# Patient Record
Sex: Male | Born: 1950
Health system: Southern US, Community
[De-identification: ages and names within clinical notes are randomized; demographics above are authoritative.]

## PROBLEM LIST (undated history)

## (undated) DIAGNOSIS — C159 Malignant neoplasm of esophagus, unspecified: Principal | ICD-10-CM

## (undated) DIAGNOSIS — Z5111 Encounter for antineoplastic chemotherapy: Secondary | ICD-10-CM

## (undated) DIAGNOSIS — I4891 Unspecified atrial fibrillation: Secondary | ICD-10-CM

## (undated) DIAGNOSIS — M109 Gout, unspecified: Secondary | ICD-10-CM

## (undated) DIAGNOSIS — Z862 Personal history of diseases of the blood and blood-forming organs and certain disorders involving the immune mechanism: Secondary | ICD-10-CM

## (undated) DIAGNOSIS — Z95828 Presence of other vascular implants and grafts: Secondary | ICD-10-CM

## (undated) DIAGNOSIS — E0590 Thyrotoxicosis, unspecified without thyrotoxic crisis or storm: Secondary | ICD-10-CM

## (undated) DIAGNOSIS — Z8619 Personal history of other infectious and parasitic diseases: Secondary | ICD-10-CM

## (undated) DIAGNOSIS — K219 Gastro-esophageal reflux disease without esophagitis: Secondary | ICD-10-CM

## (undated) DIAGNOSIS — C801 Malignant (primary) neoplasm, unspecified: Secondary | ICD-10-CM

## (undated) HISTORY — PX: LEG SURGERY: SHX1003

## (undated) HISTORY — DX: Personal history of other infectious and parasitic diseases: Z86.19

## (undated) HISTORY — DX: Gout, unspecified: M10.9

## (undated) HISTORY — DX: Hypercalcemia: E83.52

## (undated) HISTORY — DX: Presence of other vascular implants and grafts: Z95.828

## (undated) HISTORY — DX: Unspecified atrial fibrillation: I48.91

## (undated) HISTORY — DX: Encounter for antineoplastic chemotherapy: Z51.11

## (undated) HISTORY — DX: Malignant neoplasm of esophagus, unspecified: C15.9

## (undated) HISTORY — PX: OTHER SURGICAL HISTORY: SHX169

## (undated) HISTORY — DX: Thyrotoxicosis, unspecified without thyrotoxic crisis or storm: E05.90

## (undated) HISTORY — DX: Personal history of diseases of the blood and blood-forming organs and certain disorders involving the immune mechanism: Z86.2

## (undated) HISTORY — DX: Gastro-esophageal reflux disease without esophagitis: K21.9

## (undated) HISTORY — PX: TONSILLECTOMY AND ADENOIDECTOMY: SUR1326

## (undated) HISTORY — PX: TRANSTHORACIC ECHOCARDIOGRAM: SHX275

---

## 2013-04-23 ENCOUNTER — Encounter (HOSPITAL_COMMUNITY): Payer: Self-pay | Admitting: *Deleted

## 2013-04-23 ENCOUNTER — Emergency Department (HOSPITAL_COMMUNITY): Payer: BC Managed Care – PPO

## 2013-04-23 ENCOUNTER — Inpatient Hospital Stay (HOSPITAL_COMMUNITY)
Admission: EM | Admit: 2013-04-23 | Discharge: 2013-04-26 | DRG: 544 | Disposition: A | Discharge status: Home / Self Care | Payer: BC Managed Care – PPO | Attending: Internal Medicine | Admitting: Internal Medicine

## 2013-04-23 DIAGNOSIS — Z880 Allergy status to penicillin: Secondary | ICD-10-CM

## 2013-04-23 DIAGNOSIS — I4891 Unspecified atrial fibrillation: Principal | ICD-10-CM

## 2013-04-23 DIAGNOSIS — E0590 Thyrotoxicosis, unspecified without thyrotoxic crisis or storm: Secondary | ICD-10-CM

## 2013-04-23 DIAGNOSIS — J189 Pneumonia, unspecified organism: Secondary | ICD-10-CM

## 2013-04-23 DIAGNOSIS — Z79899 Other long term (current) drug therapy: Secondary | ICD-10-CM

## 2013-04-23 DIAGNOSIS — E876 Hypokalemia: Secondary | ICD-10-CM | POA: Diagnosis present

## 2013-04-23 DIAGNOSIS — F10939 Alcohol use, unspecified with withdrawal, unspecified: Secondary | ICD-10-CM

## 2013-04-23 DIAGNOSIS — F10239 Alcohol dependence with withdrawal, unspecified: Secondary | ICD-10-CM

## 2013-04-23 DIAGNOSIS — F1020 Alcohol dependence, uncomplicated: Secondary | ICD-10-CM | POA: Diagnosis present

## 2013-04-23 DIAGNOSIS — R319 Hematuria, unspecified: Secondary | ICD-10-CM

## 2013-04-23 DIAGNOSIS — Z87891 Personal history of nicotine dependence: Secondary | ICD-10-CM

## 2013-04-23 LAB — PROTIME-INR
INR: 1.25 (ref 0.00–1.49)
Prothrombin Time: 15.5 seconds — ABNORMAL HIGH (ref 11.6–15.2)

## 2013-04-23 LAB — CBC
Hemoglobin: 14.9 g/dL (ref 13.0–17.0)
MCHC: 35.7 g/dL (ref 30.0–36.0)
WBC: 18.8 10*3/uL — ABNORMAL HIGH (ref 4.0–10.5)

## 2013-04-23 LAB — BASIC METABOLIC PANEL
GFR calc Af Amer: >90 mL/min (ref >90)
GFR calc non Af Amer: >90 mL/min (ref >90)
Glucose, Bld: 102 mg/dL — ABNORMAL HIGH (ref 70–99)
Potassium: 4 mEq/L (ref 3.5–5.1)
Sodium: 129 mEq/L — ABNORMAL LOW (ref 135–145)

## 2013-04-23 LAB — URINALYSIS, ROUTINE W REFLEX MICROSCOPIC
Glucose, UA: NEGATIVE mg/dL
Hgb urine dipstick: NEGATIVE
Ketones, ur: 15 mg/dL — AB
Protein, ur: NEGATIVE mg/dL

## 2013-04-23 MED ORDER — IOHEXOL 350 MG/ML SOLN
100.0000 mL | Freq: Once | INTRAVENOUS | Status: AC | PRN
  Administered 2013-04-23: 100 mL via INTRAVENOUS

## 2013-04-23 MED ORDER — DILTIAZEM HCL 100 MG IV SOLR
5.0000 mg/h | INTRAVENOUS | Status: DC
  Administered 2013-04-23: 10 mg/h via INTRAVENOUS
  Administered 2013-04-24: 15 mg/h via INTRAVENOUS
  Administered 2013-04-24 (×2): 10 mg/h via INTRAVENOUS
  Administered 2013-04-25: 8 mg/h via INTRAVENOUS
  Filled 2013-04-23 (×3): qty 100

## 2013-04-23 MED ORDER — DEXTROSE 5 % IV SOLN
500.0000 mg | Freq: Once | INTRAVENOUS | Status: AC
  Administered 2013-04-23: 500 mg via INTRAVENOUS
  Filled 2013-04-23: qty 500

## 2013-04-23 MED ORDER — LORAZEPAM 1 MG PO TABS: 1.0000 mg | ORAL_TABLET | Freq: Four times a day (QID) | ORAL | Status: DC | PRN

## 2013-04-23 MED ORDER — THIAMINE HCL 100 MG/ML IJ SOLN
100.0000 mg | Freq: Every day | INTRAMUSCULAR | Status: DC
  Filled 2013-04-23: qty 1

## 2013-04-23 MED ORDER — LORAZEPAM 2 MG/ML IJ SOLN: 1.0000 mg | Freq: Four times a day (QID) | INTRAMUSCULAR | Status: DC | PRN

## 2013-04-23 MED ORDER — DILTIAZEM HCL 25 MG/5ML IV SOLN
10.0000 mg | Freq: Once | INTRAVENOUS | Status: AC
  Administered 2013-04-23: 10 mg via INTRAVENOUS
  Filled 2013-04-23: qty 5

## 2013-04-23 MED ORDER — LORAZEPAM 2 MG/ML IJ SOLN
1.0000 mg | Freq: Once | INTRAMUSCULAR | Status: AC
Start: 2013-04-23 — End: 2013-04-23
  Administered 2013-04-23: 1 mg via INTRAVENOUS
  Filled 2013-04-23: qty 1

## 2013-04-23 MED ORDER — DILTIAZEM HCL 25 MG/5ML IV SOLN
10.0000 mg | Freq: Once | INTRAVENOUS | Status: AC
  Administered 2013-04-23: 10 mg via INTRAVENOUS

## 2013-04-23 MED ORDER — FOLIC ACID 1 MG PO TABS
1.0000 mg | ORAL_TABLET | Freq: Every day | ORAL | Status: DC
  Administered 2013-04-25 – 2013-04-26 (×2): 1 mg via ORAL
  Filled 2013-04-23 (×3): qty 1

## 2013-04-23 MED ORDER — ADULT MULTIVITAMIN W/MINERALS CH
1.0000 | ORAL_TABLET | Freq: Every day | ORAL | Status: DC
  Filled 2013-04-23 (×2): qty 1

## 2013-04-23 MED ORDER — LORAZEPAM 2 MG/ML IJ SOLN
1.0000 mg | Freq: Once | INTRAMUSCULAR | Status: AC
  Administered 2013-04-23: 1 mg via INTRAVENOUS
  Filled 2013-04-23: qty 1

## 2013-04-23 MED ORDER — SODIUM CHLORIDE 0.9 % IV BOLUS (SEPSIS)
1000.0000 mL | Freq: Once | INTRAVENOUS | Status: AC
  Administered 2013-04-23: 1000 mL via INTRAVENOUS

## 2013-04-23 MED ORDER — DEXTROSE 5 % IV SOLN
1.0000 g | Freq: Once | INTRAVENOUS | Status: AC
  Administered 2013-04-23: 1 g via INTRAVENOUS
  Filled 2013-04-23: qty 10

## 2013-04-23 MED ORDER — VITAMIN B-1 100 MG PO TABS
100.0000 mg | ORAL_TABLET | Freq: Every day | ORAL | Status: DC
  Administered 2013-04-25 – 2013-04-26 (×2): 100 mg via ORAL
  Filled 2013-04-23 (×3): qty 1

## 2013-04-23 NOTE — ED Provider Notes (Signed)
History     CSN: [id number]  Arrival date & time 04/23/13  1721   First MD Initiated Contact with Patient 04/23/13 1738      Chief Complaint  Patient presents with  . Shortness of Breath  . Hematuria  . Atrial Fibrillation    (Consider location/radiation/quality/duration/timing/severity/associated sxs/prior treatment) Patient is a 62 y.o. male presenting with shortness of breath, hematuria, and atrial fibrillation. The history is provided by the patient.  Shortness of Breath Severity:  Moderate Associated symptoms: no abdominal pain, no chest pain, no diaphoresis, no headaches, no rash and no vomiting   Hematuria Associated symptoms include shortness of breath. Pertinent negatives include no chest pain, no abdominal pain and no headaches.  Atrial Fibrillation Associated symptoms include shortness of breath. Pertinent negatives include no chest pain, no abdominal pain and no headaches.  patient went to his primary care Dr. For hematuria today. He was found to be new onset atrial fibrillation. He has had some shortness of breath for the last few days. He states he does not feel his heart racing. No previous history of atrial fibrillation. No fevers. He has had a little bit of a cough. No abdominal pain. No numbness or weakness. No flank pain. No dysuria. He is a former smoker but does not currently smoke.  Past Medical History  Diagnosis Date  . Medical history non-contributory     Past Surgical History  Procedure Laterality Date  . Leg surgery      Family History  Problem Relation Age of Onset  . Pulmonary embolism Neg Hx   . CAD Neg Hx     History  Substance Use Topics  . Smoking status: Former Games developer  . Smokeless tobacco: Not on file  . Alcohol Use: Yes     Comment: daily      Review of Systems  Constitutional: Negative for chills, diaphoresis, activity change and appetite change.  HENT: Negative for neck stiffness.   Eyes: Negative for pain.  Respiratory:  Positive for shortness of breath. Negative for chest tightness.   Cardiovascular: Negative for chest pain, palpitations and leg swelling.  Gastrointestinal: Negative for nausea, vomiting, abdominal pain and diarrhea.  Genitourinary: Positive for hematuria. Negative for flank pain.  Musculoskeletal: Negative for back pain.  Skin: Negative for rash.  Neurological: Negative for weakness, numbness and headaches.  Psychiatric/Behavioral: Negative for behavioral problems.    Allergies  Penicillins  Home Medications   Current Outpatient Rx  Name  Route  Sig  Dispense  Refill  . aspirin EC 81 MG tablet   Oral   Take 81 mg by mouth 3 (three) times a week.         . B Complex-C (SUPER B COMPLEX PO)   Oral   Take 1 tablet by mouth daily.         . diphenhydrAMINE (BENADRYL) 25 MG tablet   Oral   Take 25 mg by mouth every evening.         . Ibuprofen-Diphenhydramine Cit (ADVIL PM PO)   Oral   Take 2 tablets by mouth every evening.         . Multiple Vitamin (MULTIVITAMIN WITH MINERALS) TABS   Oral   Take 1 tablet by mouth daily.         . pseudoephedrine (SUDAFED) 30 MG tablet   Oral   Take 60 mg by mouth daily.           BP 133/62  Pulse 119  Temp(Src) 98.8 F (  37.1 C) (Oral)  Resp 38  SpO2 96%  Physical Exam  Nursing note reviewed. Constitutional: He is oriented to person, place, and time. He appears well-developed and well-nourished.  HENT:  Head: Normocephalic and atraumatic.  Eyes: EOM are normal. Pupils are equal, round, and reactive to light.  Neck: Normal range of motion. Neck supple.  Cardiovascular: Normal heart sounds.   No murmur heard. Tachycardia. Irregular.   Pulmonary/Chest: Effort normal.  Tachypnea  Abdominal: Soft. Bowel sounds are normal. He exhibits no distension and no mass. There is no tenderness. There is no rebound and no guarding.  Musculoskeletal: Normal range of motion. He exhibits no edema.  Neurological: He is alert and  oriented to person, place, and time. No cranial nerve deficit.  Skin: Skin is warm and dry.  Psychiatric: He has a normal mood and affect.    ED Course  Procedures (including critical care time)  Labs Reviewed  CBC - Abnormal; Notable for the following:    WBC 18.8 (*)    All other components within normal limits  BASIC METABOLIC PANEL - Abnormal; Notable for the following:    Sodium 129 (*)    Chloride 95 (*)    Glucose, Bld 102 (*)    All other components within normal limits  PRO B NATRIURETIC PEPTIDE - Abnormal; Notable for the following:    Pro B Natriuretic peptide (BNP) 687.4 (*)    All other components within normal limits  PROTIME-INR - Abnormal; Notable for the following:    Prothrombin Time 15.5 (*)    All other components within normal limits  D-DIMER, QUANTITATIVE - Abnormal; Notable for the following:    D-Dimer, Quant 0.51 (*)    All other components within normal limits  URINALYSIS, ROUTINE W REFLEX MICROSCOPIC - Abnormal; Notable for the following:    Color, Urine AMBER (*)    Ketones, ur 15 (*)    All other components within normal limits  CULTURE, BLOOD (ROUTINE X 2)  CULTURE, BLOOD (ROUTINE X 2)  URINE CULTURE  APTT  LEGIONELLA ANTIGEN, URINE  STREP PNEUMONIAE URINARY ANTIGEN  PROCALCITONIN  POCT I-STAT TROPONIN I   Ct Angio Chest W/cm &/or Wo Cm  04/23/2013  *RADIOLOGY REPORT*  Clinical Data: Shortness of breath and hematuria.  Tachycardia with atrial fibrillation.  No given history of malignancy.  CT ANGIOGRAPHY CHEST  Technique:  Multidetector CT imaging of the chest using the standard protocol during bolus administration of intravenous contrast. Multiplanar reconstructed images including MIPs were obtained and reviewed to evaluate the vascular anatomy.  Contrast: OMNIPAQUE IOHEXOL 350 MG/ML SOLN  Comparison: Chest radiographs 04/23/2013.  Findings: The pulmonary arteries are well opacified with contrast. There is moderate breathing artifact which  limits evaluation of the lower lobe vessels.  However, no pulmonary emboli are identified. There are small subcarinal and hilar lymph nodes which do not appear pathologically enlarged.  There is a small hiatal hernia. Coronary artery calcifications are noted.  As demonstrated radiographically, there is multifocal ill-defined nodularity throughout the left lung, involving the upper and lower lobes.  This has a perihilar distribution.  In addition, there is lesser involvement within the right lung, primarily in the lower lobe.  There is no endobronchial lesion.  There is no significant pleural or pericardial effusion.  Images through the upper abdomen demonstrate hepatic low density consistent with steatosis.  There are no worrisome osseous findings.  IMPRESSION:  1.  No evidence of acute pulmonary embolism. 2.  Diffuse ill-defined nodularity throughout  both lungs, greater on the left.  This pattern is atypical for community-acquired pneumonia.  Consider septic emboli and atypical pneumonia in this patient with leukocytosis.  Follow-up is necessary to exclude atypical malignancy.   Original Report Authenticated By: Carey Bullocks, M.D.    Dg Chest Port 1 View  04/23/2013  *RADIOLOGY REPORT*  Clinical Data: Chest pain.  Short of breath.  Tachycardia.  PORTABLE CHEST - 1 VIEW  Comparison: None.  Findings: Patchy airspace opacities present in the left lung. Nodular density is present at the left apex which may represent some more focal area of consolidation.  Underlying malignancy/pulmonary nodule cannot be excluded. Follow-up to ensure radiographic clearing recommended.  Clearing is usually observed at 8 weeks. Emphysematous changes are present.  There is no pleural effusion identified. Monitoring leads are projected over the chest. Cardiopericardial silhouette appears within normal limits. No plain film evidence of adenopathy on this single frontal view.  IMPRESSION: Left upper lobe pneumonia.  Follow-up to ensure  clearing recommended.  Emphysema.   Original Report Authenticated By: Andreas Newport, M.D.      1. CAP (community acquired pneumonia)   2. Atrial fibrillation with rapid ventricular response   3. Hematuria   4. Alcohol withdrawal   5. Community acquired pneumonia      Date: 04/23/2013  Rate: 144  Rhythm: atrial fibrillation  QRS Axis: right  Intervals: afib  ST/T Wave abnormalities: nonspecific ST/T changes  Conduction Disutrbances:afib  Narrative Interpretation:   Old EKG Reviewed: none available  CRITICAL CARE Performed by: Billee Cashing   Total critical care time: 30  Critical care time was exclusive of separately billable procedures and treating other patients.  Critical care was necessary to treat or prevent imminent or life-threatening deterioration.  Critical care was time spent personally by me on the following activities: development of treatment plan with patient and/or surrogate as well as nursing, discussions with consultants, evaluation of patient's response to treatment, examination of patient, obtaining history from patient or surrogate, ordering and performing treatments and interventions, ordering and review of laboratory studies, ordering and review of radiographic studies, pulse oximetry and re-evaluation of patient's condition.  MDM  Patient withnew onset A. Fib with RVR. Difficult rate control requiring bolus of IV Cardizem and a Cardizem drip. Fluid boluses were also given. Has pneumonia on x-ray. CT scan was done and showed atypical pneumonia versus emboli. He'll be admitted to internal medicine for further evaluation and treatment        Juliet Rude. Rubin Payor, MD 04/24/13 [phone]

## 2013-04-23 NOTE — H&P (Signed)
[facility] History and Physical  BYRON PEACOCK ZOX:[id number] DOB: [birth year] DOA: 04/23/2013  Referring physician: Dr. Rubin Payor. PCP: Herb Grays, MD   Chief Complaint: Was referred from the PCPs office for atrial fibrillation with RVR.  HPI: Willie Owens is a 62 y.o. male with no significant history presented to his PCP today because of hematuria. Patient states that initially he had dark urine which eventually became more orange color later. When he went his PCPs office he was found to be tachycardic an EKG showed A. fib with RVR. Patient was brought to the ER and was started on Cardizem infusion for rate control. Patient also has been experiencing shortness of breath even at rest for the last 2 weeks with productive cough. 2 days ago patient had mild symptoms of fever chills though he remains afebrile in the ER. CT angiogram of the chest was done which shows features concerning for multifocal pneumonia versus septic emboli. Patient has been admitted for further management. A UA has been ordered in the ER which is pending. Denies any nausea vomiting abdominal pain weight loss or joint pains.  Review of Systems: As presented in the history of presenting illness, rest negative.  Past Medical History  Diagnosis Date  . Medical history non-contributory    Past Surgical History  Procedure Laterality Date  . Leg surgery     Social History:  reports that he has quit smoking. He does not have any smokeless tobacco history on file. He reports that  drinks alcohol. He reports that he does not use illicit drugs. Lives at home. where does patient live-- Can do ADLs. Can patient participate in ADLs?  Allergies  Allergen Reactions  . Penicillins Other (See Comments)    Hallucinations, from childhood    Family History  Problem Relation Age of Onset  . Pulmonary embolism Neg Hx   . CAD Neg Hx       Prior to Admission medications   Medication Sig Start Date End Date  Taking? Authorizing Provider  aspirin EC 81 MG tablet Take 81 mg by mouth 3 (three) times a week.   Yes Historical Provider, MD  B Complex-C (SUPER B COMPLEX PO) Take 1 tablet by mouth daily.   Yes Historical Provider, MD  diphenhydrAMINE (BENADRYL) 25 MG tablet Take 25 mg by mouth every evening.   Yes Historical Provider, MD  Ibuprofen-Diphenhydramine Cit (ADVIL PM PO) Take 2 tablets by mouth every evening.   Yes Historical Provider, MD  Multiple Vitamin (MULTIVITAMIN WITH MINERALS) TABS Take 1 tablet by mouth daily.   Yes Historical Provider, MD  pseudoephedrine (SUDAFED) 30 MG tablet Take 60 mg by mouth daily.   Yes Historical Provider, MD   Physical Exam: Filed Vitals:   04/23/13 2015 04/23/13 2030 04/23/13 2045 04/23/13 2045  BP: 120/103 151/85 142/115   Pulse:      Temp:      TempSrc:      Resp: 25 29 32   SpO2:    96%     General:  Well-developed well-nourished.  Eyes: Anicteric no pallor.  ENT: No discharge from the ears eyes nose and mouth.  Neck: No mass felt.  Cardiovascular: S1-S2 heard tachycardic irregular.  Respiratory: No rhonchi no crepitations.  Abdomen: Soft nontender bowel sounds present.  Skin: No rash.  Musculoskeletal: No edema.  Psychiatric: Appears normal.  Neurologic: Alert awake oriented to time place and person. Moves all extremities.  Labs on Admission:  Basic Metabolic Panel:  Recent Labs Lab 04/23/13  1744  NA 129*  K 4.0  CL 95*  CO2 22  GLUCOSE 102*  BUN 17  CREATININE 0.85  CALCIUM 9.4   Liver Function Tests: No results found for this basename: AST, ALT, ALKPHOS, BILITOT, PROT, ALBUMIN,  in the last 168 hours No results found for this basename: LIPASE, AMYLASE,  in the last 168 hours No results found for this basename: AMMONIA,  in the last 168 hours CBC:  Recent Labs Lab 04/23/13 1744  WBC 18.8*  HGB 14.9  HCT 41.7  MCV 84.8  PLT 170   Cardiac Enzymes: No results found for this basename: CKTOTAL, CKMB,  CKMBINDEX, TROPONINI,  in the last 168 hours  BNP (last 3 results)  Recent Labs  04/23/13 1730  PROBNP 687.4*   CBG: No results found for this basename: GLUCAP,  in the last 168 hours  Radiological Exams on Admission: Ct Angio Chest W/cm &/or Wo Cm  04/23/2013  *RADIOLOGY REPORT*  Clinical Data: Shortness of breath and hematuria.  Tachycardia with atrial fibrillation.  No given history of malignancy.  CT ANGIOGRAPHY CHEST  Technique:  Multidetector CT imaging of the chest using the standard protocol during bolus administration of intravenous contrast. Multiplanar reconstructed images including MIPs were obtained and reviewed to evaluate the vascular anatomy.  Contrast: OMNIPAQUE IOHEXOL 350 MG/ML SOLN  Comparison: Chest radiographs 04/23/2013.  Findings: The pulmonary arteries are well opacified with contrast. There is moderate breathing artifact which limits evaluation of the lower lobe vessels.  However, no pulmonary emboli are identified. There are small subcarinal and hilar lymph nodes which do not appear pathologically enlarged.  There is a small hiatal hernia. Coronary artery calcifications are noted.  As demonstrated radiographically, there is multifocal ill-defined nodularity throughout the left lung, involving the upper and lower lobes.  This has a perihilar distribution.  In addition, there is lesser involvement within the right lung, primarily in the lower lobe.  There is no endobronchial lesion.  There is no significant pleural or pericardial effusion.  Images through the upper abdomen demonstrate hepatic low density consistent with steatosis.  There are no worrisome osseous findings.  IMPRESSION:  1.  No evidence of acute pulmonary embolism. 2.  Diffuse ill-defined nodularity throughout both lungs, greater on the left.  This pattern is atypical for community-acquired pneumonia.  Consider septic emboli and atypical pneumonia in this patient with leukocytosis.  Follow-up is necessary  to exclude atypical malignancy.   Original Report Authenticated By: Carey Bullocks, M.D.    Dg Chest Port 1 View  04/23/2013  *RADIOLOGY REPORT*  Clinical Data: Chest pain.  Short of breath.  Tachycardia.  PORTABLE CHEST - 1 VIEW  Comparison: None.  Findings: Patchy airspace opacities present in the left lung. Nodular density is present at the left apex which may represent some more focal area of consolidation.  Underlying malignancy/pulmonary nodule cannot be excluded. Follow-up to ensure radiographic clearing recommended.  Clearing is usually observed at 8 weeks. Emphysematous changes are present.  There is no pleural effusion identified. Monitoring leads are projected over the chest. Cardiopericardial silhouette appears within normal limits. No plain film evidence of adenopathy on this single frontal view.  IMPRESSION: Left upper lobe pneumonia.  Follow-up to ensure clearing recommended.  Emphysema.   Original Report Authenticated By: Andreas Newport, M.D.     EKG: Independently reviewed. Atrial fibrillation with RVR.  Assessment/Plan Principal Problem:   Atrial fibrillation with RVR Active Problems:   Community acquired pneumonia   Hematuria   1.  Atrial fibrillation with RVR - patient's CHADS2 Vasc score is only zero. Presently patient is been placed on Cardizem infusion. I have also ordered by mouth Cardizem. Slowly taper off Cardizem infusion. Possible trigger for the A. fib could be possible pneumonia. Check thyroid function test 2-D echo and cardiac enzymes. Presently patient has complaints of possible hematuria for which patient has not been placed on any aspirin. Patient takes Sudafed and has been discontinued. 2. Shortness of breath with possible pneumonia - CT chest suggests possible atypical pneumonia. There is also suggestion of possible septic emboli for which blood cultures have been ordered. Since patient also complains of hematuria pulmonary renal syndrome is under consideration for  which I have consulted pulmonary. I did discuss with Dr. Vassie Loll and they will be seeing in consult and at this time they have requested to ANCA levels. 3. Hematuria - check UA. Patient may eventually need urology followup given the age and history of smoking. Discontinue aspirin. 4. Alcoholism - patient has been placed on alcohol withdrawal protocol. 5. History of cigarette smoking quit in 2006.    Code Status: Full code.  Family Communication: Patient's wife at the bedside.  Disposition Plan: Admit to inpatient.    KAKRAKANDY,ARSHAD N. [facility] Pager [phone].  If 7PM-7AM, please contact night-coverage [email or web address] Password [facility] 04/23/2013, 9:31 PM

## 2013-04-23 NOTE — ED Notes (Signed)
Pt originally went to the [facility] with concerns of hematuria today and was found to have elevated heart rate and in atrial fibrillation.  Pt is complaining of some sob for a couple of weeks.

## 2013-04-23 NOTE — Consult Note (Signed)
PULMONARY  / CRITICAL CARE MEDICINE  Name: Willie Owens MRN: [id number] DOB: June 22, 1951    ADMISSION DATE:  04/23/2013 CONSULTATION DATE:  04/23/2013  REFERRING MD :  [facility] PRIMARY SERVICE: [facility]  CHIEF COMPLAINT:  Dyspnea  BRIEF PATIENT DESCRIPTION: 62 yo without significant history admitted with dyspnea / cough and new onset AF.  Reported hematuria today, but none noted on UA in ED.  SIGNIFICANT EVENTS / STUDIES:  4/28  Chest CT angio >>> No PE, bilateral nodular airspace disease  LINES / TUBES:  CULTURES: 4/28  Blood >>>  ANTIBIOTICS: Azithromycin  4/28 >>> Ceftriaxone  4/28 >>>  HISTORY OF PRESENT ILLNESS:  62 yo without significant history presenting with dyspnea and cough with productive of clear sputum and hematuria x 1 day.  He reports taking ASA 81 mg 3 x week and Sudafed daily for nasal congestion since 5 years ago.  He smoked 2 ppd x 30 years but quit in 2006. He drinks Vodka daily with last drink 3 days ago. He reports no recent history of traveling outside of [place].  He had some diarrhea on Saturday. There was no nausea or vomiting.  He denies chest pain or palpitations.   PAST MEDICAL HISTORY :  Past Medical History  Diagnosis Date  . Medical history non-contributory    Past Surgical History  Procedure Laterality Date  . Leg surgery     Prior to Admission medications   Medication Sig Start Date End Date Taking? Authorizing Provider  aspirin EC 81 MG tablet Take 81 mg by mouth 3 (three) times a week.   Yes Historical Provider, MD  B Complex-C (SUPER B COMPLEX PO) Take 1 tablet by mouth daily.   Yes Historical Provider, MD  diphenhydrAMINE (BENADRYL) 25 MG tablet Take 25 mg by mouth every evening.   Yes Historical Provider, MD  Ibuprofen-Diphenhydramine Cit (ADVIL PM PO) Take 2 tablets by mouth every evening.   Yes Historical Provider, MD  Multiple Vitamin (MULTIVITAMIN WITH MINERALS) TABS Take 1 tablet by mouth daily.   Yes Historical Provider, MD   pseudoephedrine (SUDAFED) 30 MG tablet Take 60 mg by mouth daily.   Yes Historical Provider, MD   Allergies  Allergen Reactions  . Penicillins Other (See Comments)    Hallucinations, from childhood   FAMILY HISTORY:  Family History  Problem Relation Age of Onset  . Pulmonary embolism Neg Hx   . CAD Neg Hx    SOCIAL HISTORY:  reports that he has quit smoking. He does not have any smokeless tobacco history on file. He reports that  drinks alcohol. He reports that he does not use illicit drugs.  REVIEW OF SYSTEMS:   Constitutional: Denies fever, chills, diaphoresis, appetite change and fatigue.  HEENT: Denies photophobia, eye pain, redness, hearing loss, ear pain, congestion, sore throat, rhinorrhea, sneezing, mouth sores, trouble swallowing, neck pain, neck stiffness and tinnitus.  Respiratory: Denies DOE, chest tightness, and wheezing; reports dyspnea and cough productive of clear sputum. Cardiovascular: Denies chest pain, palpitations and leg swelling.  Gastrointestinal: Denies nausea, vomiting, abdominal pain, diarrhea, constipation, blood in stool and abdominal distention.  Genitourinary: Denies dysuria, urgency, frequency, flank pain and difficulty urinating;  reports hematuria. Musculoskeletal: Denies myalgias, back pain, joint swelling, arthralgias and gait problem.  Skin: Denies pallor, rash and wound.  Neurological: Denies dizziness, seizures, syncope, weakness, light-headedness, numbness and headaches.  Hematological: Denies adenopathy. Easy bruising, personal or family bleeding history  Psychiatric/Behavioral: Denies suicidal ideation, mood changes, confusion, nervousness, sleep disturbance and agitation  VITAL SIGNS: Temp:  [97.6 F (36.4 C)-98.8 F (37.1 C)] 98.8 F (37.1 C) (04/28 2145) Pulse Rate:  [110] 110 (04/28 1731) Resp:  [24-34] 29 (04/28 2200) BP: (116-154)/(72-115) 148/77 mmHg (04/28 2215) SpO2:  [93 %-98 %] 98 % (04/28 2215)  HEMODYNAMICS:    VENTILATOR SETTINGS:   INTAKE / OUTPUT: Intake/Output   None    PHYSICAL EXAMINATION: General:  No acute distress, jittery Neuro:  Awake, alert, cooperative HEENT:  PERRL, moist membraanes Cardiovascular:  Irregularly irregular heart rate Lungs:  Bilateral air entry, scattered rales Abdomen:  Soft, nontender, nondistended Musculoskeletal:  Moves all extremities,no edema Skin:  Intact  LABS:  Recent Labs Lab 04/23/13 1730 04/23/13 1744 04/23/13 1745  HGB  --  14.9  --   WBC  --  18.8*  --   PLT  --  170  --   NA  --  129*  --   K  --  4.0  --   CL  --  95*  --   CO2  --  22  --   GLUCOSE  --  102*  --   BUN  --  17  --   CREATININE  --  0.85  --   CALCIUM  --  9.4  --   APTT  --   --  32  INR  --   --  1.25  PROBNP 687.4*  --   --    No results found for this basename: GLUCAP,  in the last 168 hours  CXR: 4/28 >>>  LUL airspace disease  ASSESSMENT / PLAN:  Multifocal pneumonia (CAP, atypical); doubt pulmonary edema Atrial fibrillation with RVR Possible alcohol withdrawal Reported hematuria, but none noted on UA  -->  PCT / Strep urine Ag / Legionella urine Ag -->  Ceftriaxone / Azithromycin -->  May need connective tissue disorder workup if no clinical improvement -->  Cardizem gtt -->  Would hold Heparin for now given reported hematuria -->  2D echo -->  Alcohol withdrawal protocol -->  Will follow  Lonia Farber, MD Pulmonary and Critical Care Medicine [facility] Pager: [phone]  04/23/2013, 10:34 PM

## 2013-04-23 NOTE — ED Notes (Signed)
Willie Owens  [facility])  [phone]

## 2013-04-23 NOTE — ED Notes (Signed)
Pt has elevated white count 18.4

## 2013-04-24 ENCOUNTER — Inpatient Hospital Stay (HOSPITAL_COMMUNITY): Payer: BC Managed Care – PPO

## 2013-04-24 DIAGNOSIS — J189 Pneumonia, unspecified organism: Secondary | ICD-10-CM

## 2013-04-24 DIAGNOSIS — I4891 Unspecified atrial fibrillation: Secondary | ICD-10-CM

## 2013-04-24 LAB — COMPREHENSIVE METABOLIC PANEL
ALT: 20 U/L (ref 0–53)
Albumin: 2.6 g/dL — ABNORMAL LOW (ref 3.5–5.2)
Alkaline Phosphatase: 91 U/L (ref 39–117)
Calcium: 8.8 mg/dL (ref 8.4–10.5)
GFR calc Af Amer: >90 mL/min (ref >90)
Glucose, Bld: 96 mg/dL (ref 70–99)
Potassium: 3.2 mEq/L — ABNORMAL LOW (ref 3.5–5.1)
Sodium: 130 mEq/L — ABNORMAL LOW (ref 135–145)
Total Protein: 6 g/dL (ref 6.0–8.3)

## 2013-04-24 LAB — CBC WITH DIFFERENTIAL/PLATELET
Basophils Relative: 0 % (ref 0–1)
Eosinophils Relative: 1 % (ref 0–5)
HCT: 36.9 % — ABNORMAL LOW (ref 39.0–52.0)
Hemoglobin: 12.8 g/dL — ABNORMAL LOW (ref 13.0–17.0)
Lymphocytes Relative: 18 % (ref 12–46)
MCHC: 34.7 g/dL (ref 30.0–36.0)
MCV: 85.4 fL (ref 78.0–100.0)
Monocytes Absolute: 1.2 10*3/uL — ABNORMAL HIGH (ref 0.1–1.0)
Monocytes Relative: 10 % (ref 3–12)
Neutro Abs: 8.3 10*3/uL — ABNORMAL HIGH (ref 1.7–7.7)
RDW: 13.5 % (ref 11.5–15.5)

## 2013-04-24 LAB — TSH: TSH: <0.008 u[IU]/mL — ABNORMAL LOW (ref 0.350–4.500)

## 2013-04-24 LAB — STREP PNEUMONIAE URINARY ANTIGEN: Strep Pneumo Urinary Antigen: NEGATIVE

## 2013-04-24 LAB — T4, FREE: Free T4: 3.12 ng/dL — ABNORMAL HIGH (ref 0.80–1.80)

## 2013-04-24 LAB — MAGNESIUM: Magnesium: 1.8 mg/dL (ref 1.5–2.5)

## 2013-04-24 LAB — TROPONIN I: Troponin I: <0.3 ng/mL (ref <0.30)

## 2013-04-24 LAB — T3, FREE: T3, Free: 4.6 pg/mL — ABNORMAL HIGH (ref 2.3–4.2)

## 2013-04-24 MED ORDER — LORAZEPAM 1 MG PO TABS
1.0000 mg | ORAL_TABLET | Freq: Four times a day (QID) | ORAL | Status: DC | PRN
  Filled 2013-04-24 (×2): qty 1

## 2013-04-24 MED ORDER — ONDANSETRON HCL 4 MG PO TABS: 4.0000 mg | ORAL_TABLET | Freq: Four times a day (QID) | ORAL | Status: DC | PRN

## 2013-04-24 MED ORDER — ACETAMINOPHEN 325 MG PO TABS: 650.0000 mg | ORAL_TABLET | Freq: Four times a day (QID) | ORAL | Status: DC | PRN

## 2013-04-24 MED ORDER — SODIUM CHLORIDE 0.9 % IV SOLN
INTRAVENOUS | Status: DC
  Administered 2013-04-24: 03:00:00 via INTRAVENOUS

## 2013-04-24 MED ORDER — DILTIAZEM HCL 30 MG PO TABS
30.0000 mg | ORAL_TABLET | Freq: Three times a day (TID) | ORAL | Status: DC
  Administered 2013-04-24 – 2013-04-26 (×7): 30 mg via ORAL
  Filled 2013-04-24 (×10): qty 1

## 2013-04-24 MED ORDER — LORAZEPAM 1 MG PO TABS: 0.0000 mg | ORAL_TABLET | Freq: Two times a day (BID) | ORAL | Status: DC

## 2013-04-24 MED ORDER — CHLORDIAZEPOXIDE HCL 25 MG PO CAPS
25.0000 mg | ORAL_CAPSULE | Freq: Three times a day (TID) | ORAL | Status: DC
  Administered 2013-04-24 – 2013-04-25 (×4): 25 mg via ORAL
  Filled 2013-04-24 (×4): qty 1

## 2013-04-24 MED ORDER — SODIUM CHLORIDE 0.9 % IJ SOLN
3.0000 mL | Freq: Two times a day (BID) | INTRAMUSCULAR | Status: DC
  Administered 2013-04-24 – 2013-04-26 (×5): 3 mL via INTRAVENOUS

## 2013-04-24 MED ORDER — DEXTROSE 5 % IV SOLN
1.0000 g | INTRAVENOUS | Status: DC
  Administered 2013-04-24 – 2013-04-25 (×2): 1 g via INTRAVENOUS
  Filled 2013-04-24 (×3): qty 10

## 2013-04-24 MED ORDER — ACETAMINOPHEN 650 MG RE SUPP: 650.0000 mg | Freq: Four times a day (QID) | RECTAL | Status: DC | PRN

## 2013-04-24 MED ORDER — LORAZEPAM 2 MG/ML IJ SOLN
1.0000 mg | Freq: Four times a day (QID) | INTRAMUSCULAR | Status: DC | PRN
  Administered 2013-04-24 – 2013-04-25 (×2): 1 mg via INTRAVENOUS
  Filled 2013-04-24 (×2): qty 1

## 2013-04-24 MED ORDER — ADULT MULTIVITAMIN W/MINERALS CH
1.0000 | ORAL_TABLET | Freq: Every day | ORAL | Status: DC
  Administered 2013-04-25 – 2013-04-26 (×2): 1 via ORAL
  Filled 2013-04-24 (×3): qty 1

## 2013-04-24 MED ORDER — LORAZEPAM 1 MG PO TABS
0.0000 mg | ORAL_TABLET | Freq: Four times a day (QID) | ORAL | Status: DC
  Administered 2013-04-24 (×3): 1 mg via ORAL
  Administered 2013-04-25: 2 mg via ORAL
  Administered 2013-04-25: 1 mg via ORAL
  Filled 2013-04-24 (×2): qty 1
  Filled 2013-04-24: qty 2

## 2013-04-24 MED ORDER — VITAMIN B-1 100 MG PO TABS
100.0000 mg | ORAL_TABLET | Freq: Every day | ORAL | Status: DC
  Filled 2013-04-24: qty 1

## 2013-04-24 MED ORDER — ONDANSETRON HCL 4 MG/2ML IJ SOLN: 4.0000 mg | Freq: Four times a day (QID) | INTRAMUSCULAR | Status: DC | PRN

## 2013-04-24 MED ORDER — POTASSIUM CHLORIDE CRYS ER 20 MEQ PO TBCR
40.0000 meq | EXTENDED_RELEASE_TABLET | Freq: Once | ORAL | Status: AC
  Administered 2013-04-24: 40 meq via ORAL
  Filled 2013-04-24: qty 2

## 2013-04-24 MED ORDER — THIAMINE HCL 100 MG/ML IJ SOLN
100.0000 mg | Freq: Every day | INTRAMUSCULAR | Status: DC
  Filled 2013-04-24: qty 1

## 2013-04-24 MED ORDER — FOLIC ACID 1 MG PO TABS
1.0000 mg | ORAL_TABLET | Freq: Every day | ORAL | Status: DC
  Filled 2013-04-24 (×2): qty 1

## 2013-04-24 MED ORDER — DEXTROSE 5 % IV SOLN
500.0000 mg | INTRAVENOUS | Status: DC
  Administered 2013-04-24: 500 mg via INTRAVENOUS
  Filled 2013-04-24 (×2): qty 500

## 2013-04-24 NOTE — Progress Notes (Signed)
[facility] PROGRESS NOTE  Willie Owens ZOX:[id number] DOB: 1951/07/12 DOA: 04/23/2013 PCP: Herb Grays, MD   Brief Narrative:  62 y/o male with no known past medical hx , heavy etoh use and hx of heavy smoking for several years ( now quit for past 8 years) presented to PCP office for hematuria but was found to be tachycardic with EKG showing rapid Afib.  Also had SOB with productive cough. CT chest done showed findings of multifocal pneumonia vs septic emboli.  Assessment/Plan: Afib with RVR Likely due to hyperthyroidism , newly diagnosed ( see below) Rate controlled on cardizem drip. Titrate dose.  Follow 2D echo results. Does not need anticoagulation given CHADS2 score of  0.  ?atypical pneumonia Started on ceftriaxone and azithro. Follow blood cx, sputum cx  Step ag negative. Legionella ag pending Appreciate pulm eval and recommendations. Connective tissue w/up sent Monitor o2 sat  hematuria UA unremarkable. Given hx of smoking will need outpatient urology evaluation.  Hyperthyroidism New finding. Has markedly suppressed TSH and elevated free t3 and t4 Cannot get RIU as patient received contrast for chest CT already. Will start on PTU 100 mg tid. Patient informs of weight loss of 35 lbs over 2 years and apppears restless on exam. Possibly related to this. Monitor on tele  Etoh abuse  ? Early withdrawal. On CIWA. Started scheduled librium   Code Status: full code Family Communication: wife at bedside Disposition Plan: home once stable. Continue stepdown monitoring for now   Consultants:  pulmonary  Procedures:  none  Antibiotics:  IV rocephin and azithromycin ( day 1)  HPI/Subjective: patient informs his breathing to be better this am. Admission H&P reviewed.   Objective: Filed Vitals:   04/24/13 0800 04/24/13 1200 04/24/13 1549 04/24/13 1600  BP:   160/74   Pulse:      Temp:  97.6 F (36.4 C)  98.1 F (36.7 C)  TempSrc: Oral Oral  Oral   Resp: 32     Height:      Weight:      SpO2:        Intake/Output Summary (Last 24 hours) at 04/24/13 1750 Last data filed at 04/24/13 1700  Gross per 24 hour  Intake 528.33 ml  Output      0 ml  Net 528.33 ml   Filed Weights   04/24/13 0203  Weight: 88.1 kg (194 lb 3.6 oz)    Exam:   General: middle aged malae lying in bed, appears restless at times  HEENT: no pallor, moist mucosa  Cardiovascular: S1&S2 irregular, no murmurs  Respiratory: coarse crackles over left lung base  Abdomen: soft, NT, ND, bs+  Musculoskeletal: warm, no edema   CNS: AAOX3, restless during conversations, no gross tremors  Data Reviewed: Basic Metabolic Panel:  Recent Labs Lab 04/23/13 1744 04/24/13 0430 04/24/13 0840  NA 129* 130*  --   K 4.0 3.2*  --   CL 95* 98  --   CO2 22 22  --   GLUCOSE 102* 96  --   BUN 17 13  --   CREATININE 0.85 0.72  --   CALCIUM 9.4 8.8  --   MG  --   --  1.8   Liver Function Tests:  Recent Labs Lab 04/24/13 0430  AST 23  ALT 20  ALKPHOS 91  BILITOT 3.4*  PROT 6.0  ALBUMIN 2.6*   No results found for this basename: LIPASE, AMYLASE,  in the last 168 hours No results found  for this basename: AMMONIA,  in the last 168 hours CBC:  Recent Labs Lab 04/23/13 1744 04/24/13 0430  WBC 18.8* 11.7*  NEUTROABS  --  8.3*  HGB 14.9 12.8*  HCT 41.7 36.9*  MCV 84.8 85.4  PLT 170 150   Cardiac Enzymes:  Recent Labs Lab 04/24/13 0201 04/24/13 0830 04/24/13 1550  TROPONINI <0.30 <0.30 <0.30   BNP (last 3 results)  Recent Labs  04/23/13 1730  PROBNP 687.4*   CBG: No results found for this basename: GLUCAP,  in the last 168 hours  Recent Results (from the past 240 hour(s))  MRSA PCR SCREENING     Status: None   Collection Time    04/24/13  1:52 AM      Result Value Range Status   MRSA by PCR NEGATIVE  NEGATIVE Final   Comment:            The GeneXpert MRSA Assay (FDA     approved for NASAL specimens     only), is one component  of a     comprehensive MRSA colonization     surveillance program. It is not     intended to diagnose MRSA     infection nor to guide or     monitor treatment for     MRSA infections.     Studies: Ct Angio Chest W/cm &/or Wo Cm  04/23/2013  *RADIOLOGY REPORT*  Clinical Data: Shortness of breath and hematuria.  Tachycardia with atrial fibrillation.  No given history of malignancy.  CT ANGIOGRAPHY CHEST  Technique:  Multidetector CT imaging of the chest using the standard protocol during bolus administration of intravenous contrast. Multiplanar reconstructed images including MIPs were obtained and reviewed to evaluate the vascular anatomy.  Contrast: OMNIPAQUE IOHEXOL 350 MG/ML SOLN  Comparison: Chest radiographs 04/23/2013.  Findings: The pulmonary arteries are well opacified with contrast. There is moderate breathing artifact which limits evaluation of the lower lobe vessels.  However, no pulmonary emboli are identified. There are small subcarinal and hilar lymph nodes which do not appear pathologically enlarged.  There is a small hiatal hernia. Coronary artery calcifications are noted.  As demonstrated radiographically, there is multifocal ill-defined nodularity throughout the left lung, involving the upper and lower lobes.  This has a perihilar distribution.  In addition, there is lesser involvement within the right lung, primarily in the lower lobe.  There is no endobronchial lesion.  There is no significant pleural or pericardial effusion.  Images through the upper abdomen demonstrate hepatic low density consistent with steatosis.  There are no worrisome osseous findings.  IMPRESSION:  1.  No evidence of acute pulmonary embolism. 2.  Diffuse ill-defined nodularity throughout both lungs, greater on the left.  This pattern is atypical for community-acquired pneumonia.  Consider septic emboli and atypical pneumonia in this patient with leukocytosis.  Follow-up is necessary to exclude atypical  malignancy.   Original Report Authenticated By: Carey Bullocks, M.D.    US Abdomen Complete  04/24/2013  *RADIOLOGY REPORT*  Clinical Data:  Hematuria.  History of lung nodules.  Alcohol abuse.  Elevated bilirubin.  Atrial fibrillation.  COMPLETE ABDOMINAL ULTRASOUND  Comparison:  No comparison abdominal or pelvic ultrasound/CT. 04/23/2013 chest x-ray is available.  Findings:  Gallbladder:  Gallbladder sludge.  Gallbladder wall thickening measuring up to 4.1 mm.  Trace pericholecystic fluid.  The patient was not tender over this region during scanning per ultrasound technologist.  Common bile duct:  3.1 mm.  Liver:  Mild increased echogenicity  suggestive of mild fatty infiltration without focal lesion identified.  Trace peri hepatic fluid.  IVC:  Appears normal.  Pancreas:  Slightly limited evaluation secondary to bowel gas without discrete mass identified.  Spleen:  8.8 cm.  No focal lesion.  Right Kidney:  12.0 cm. No hydronephrosis or renal mass.  Left Kidney:  12.6 cm. No hydronephrosis or renal mass.  Abdominal aorta:  No abdominal aortic aneurysm.  2.1 cm maximal AP dimension.  Small left-sided pleural effusion noted.  IMPRESSION: Gallbladder sludge with mild gallbladder wall thickening.  Minimal pericholecystic fluid.  The patient not tender over this region during scanning.  This appearance of the gallbladder may reflect result of metabolic abnormality rather than inflammation.  Mild fatty infiltration of liver.  No renal mass or renal calculi identified as cause of patient's hematuria.  Small left-sided pleural effusion.  Slightly limited evaluation of the pancreas secondary to bowel gas.   Original Report Authenticated By: Lacy Duverney, M.D.    Dg Chest Port 1 View  04/23/2013  *RADIOLOGY REPORT*  Clinical Data: Chest pain.  Short of breath.  Tachycardia.  PORTABLE CHEST - 1 VIEW  Comparison: None.  Findings: Patchy airspace opacities present in the left lung. Nodular density is present at the left  apex which may represent some more focal area of consolidation.  Underlying malignancy/pulmonary nodule cannot be excluded. Follow-up to ensure radiographic clearing recommended.  Clearing is usually observed at 8 weeks. Emphysematous changes are present.  There is no pleural effusion identified. Monitoring leads are projected over the chest. Cardiopericardial silhouette appears within normal limits. No plain film evidence of adenopathy on this single frontal view.  IMPRESSION: Left upper lobe pneumonia.  Follow-up to ensure clearing recommended.  Emphysema.   Original Report Authenticated By: Andreas Newport, M.D.     Scheduled Meds: . azithromycin  500 mg Intravenous Q24H  . cefTRIAXone (ROCEPHIN)  IV  1 g Intravenous Q24H  . chlordiazePOXIDE  25 mg Oral Q8H  . diltiazem  30 mg Oral Q8H  . folic acid  1 mg Oral Daily  . folic acid  1 mg Oral Daily  . LORazepam  0-4 mg Oral Q6H   Followed by  . [START ON 04/26/2013] LORazepam  0-4 mg Oral Q12H  . multivitamin with minerals  1 tablet Oral Daily  . multivitamin with minerals  1 tablet Oral Daily  . sodium chloride  3 mL Intravenous Q12H  . thiamine  100 mg Oral Daily   Continuous Infusions: . sodium chloride 10 mL/hr at 04/24/13 0231  . diltiazem (CARDIZEM) infusion 10 mg/hr (04/24/13 1700)      Time spent:35 minutes    Andreka Stucki  [facility] Pager [phone] If 7PM-7AM, please contact night-coverage at [email or web address], password [facility] 04/24/2013, 5:50 PM  LOS: 1 day

## 2013-04-24 NOTE — Progress Notes (Signed)
*  PRELIMINARY RESULTS* Echocardiogram 2D Echocardiogram has been performed.  Jeryl Columbia 04/24/2013, 12:11 PM

## 2013-04-24 NOTE — Progress Notes (Signed)
eLink Physician-Brief Progress Note Patient Name: Willie Owens DOB: [birth year] MRN: [id number]  Date of Service  04/24/2013   HPI/Events of Note   Hypokalemia  eICU Interventions  Potassium replaced   Intervention Category Minor Interventions: Electrolytes abnormality - evaluation and management  DETERDING,ELIZABETH 04/24/2013, 6:08 AM

## 2013-04-24 NOTE — Progress Notes (Signed)
PULMONARY  / CRITICAL CARE MEDICINE  Name: Willie Owens MRN: [id number] DOB: Mar 30, 1951    ADMISSION DATE:  04/23/2013 CONSULTATION DATE:  04/23/2013  REFERRING MD :  [facility] PRIMARY SERVICE: [facility]  CHIEF COMPLAINT:  Dyspnea  BRIEF PATIENT DESCRIPTION: 62 yo without significant history admitted with dyspnea / cough and new onset AF.  Reported hematuria today, but none noted on UA in ED.  SIGNIFICANT EVENTS / STUDIES:  4/28  Chest CT angio >>> No PE, bilateral nodular airspace disease  LINES / TUBES:  CULTURES:  Blood 4/28 >>> Urine 4/28 >>   ANTIBIOTICS: Azithromycin  4/28 >>> Ceftriaxone  4/28 >>>  HISTORY OF PRESENT ILLNESS:  62 yo without significant history presenting with dyspnea and cough with productive of clear sputum and hematuria x 1 day.  He reports taking ASA 81 mg 3 x week and Sudafed daily for nasal congestion since 5 years ago.  He smoked 2 ppd x 30 years but quit in 2006. He drinks Vodka daily with last drink 3 days ago. He reports no recent history of traveling outside of [place].  He had some diarrhea on Saturday. There was no nausea or vomiting.  He denies chest pain or palpitations.   VITAL SIGNS: Temp:  [97.5 F (36.4 C)-98.8 F (37.1 C)] 98.2 F (36.8 C) (04/29 0700) Pulse Rate:  [82-133] 82 (04/29 0700) Resp:  [18-39] 32 (04/29 0800) BP: (105-154)/(53-115) 105/65 mmHg (04/29 0700) SpO2:  [91 %-99 %] 97 % (04/29 0700) Weight:  [88.1 kg (194 lb 3.6 oz)] 88.1 kg (194 lb 3.6 oz) (04/29 0203)  HEMODYNAMICS:   VENTILATOR SETTINGS:   INTAKE / OUTPUT: Intake/Output     04/28 0701 - 04/29 0700 04/29 0701 - 04/30 0700   I.V. (mL/kg) 169.1 (1.9) 59.3 (0.7)   Total Intake(mL/kg) 169.1 (1.9) 59.3 (0.7)   Net +169.1 +59.3        Urine Occurrence 3 x 2 x    PHYSICAL EXAMINATION: General:  No acute distress, jittery Neuro:  Awake, alert, cooperative HEENT:  PERRL, moist membraanes Cardiovascular:  Irregularly irregular heart rate Lungs:  Bilateral air entry,  scattered rales Abdomen:  Soft, nontender, nondistended Musculoskeletal:  Moves all extremities,no edema Skin:  Intact  LABS:  Recent Labs Lab 04/23/13 1730 04/23/13 1744 04/23/13 1745 04/24/13 0006 04/24/13 0201 04/24/13 0430 04/24/13 0830 04/24/13 0840  HGB  --  14.9  --   --   --  12.8*  --   --   WBC  --  18.8*  --   --   --  11.7*  --   --   PLT  --  170  --   --   --  150  --   --   NA  --  129*  --   --   --  130*  --   --   K  --  4.0  --   --   --  3.2*  --   --   CL  --  95*  --   --   --  98  --   --   CO2  --  22  --   --   --  22  --   --   GLUCOSE  --  102*  --   --   --  96  --   --   BUN  --  17  --   --   --  13  --   --  CREATININE  --  0.85  --   --   --  0.72  --   --   CALCIUM  --  9.4  --   --   --  8.8  --   --   MG  --   --   --   --   --   --   --  1.8  AST  --   --   --   --   --  23  --   --   ALT  --   --   --   --   --  20  --   --   ALKPHOS  --   --   --   --   --  91  --   --   BILITOT  --   --   --   --   --  3.4*  --   --   PROT  --   --   --   --   --  6.0  --   --   ALBUMIN  --   --   --   --   --  2.6*  --   --   APTT  --   --  32  --   --   --   --   --   INR  --   --  1.25  --   --   --   --   --   TROPONINI  --   --   --   --  <0.30  --  <0.30  --   PROCALCITON  --   --   --  1.79  --   --   --   --   PROBNP 687.4*  --   --   --   --   --   --   --    No results found for this basename: GLUCAP,  in the last 168 hours  CXR: 4/28 >>>  LUL airspace disease  ASSESSMENT / PLAN:  B patchy infiltrates, suspect multifocal pneumonia (CAP, atypical) ; doubt pulmonary edema. Other possibilities auto-immune, vasculitic or granulomatous disease. Pct equivical at 1.79, pneumococcal Ag negative Atrial fibrillation with RVR Possible alcohol withdrawal Reported hematuria, but none noted on UA. Raises question pulmonary-renal syndrome  -->  Legionella urine Ag pending -->  Agree w Ceftriaxone / Azithromycin -->  Agree with ANCA, pending -->   May need connective tissue disorder workup or possibly bx's if no clinical and radiographical  improvement -->  Cardizem gtt -->  Would hold Heparin for now given reported hematuria -->  2D echo pending -->  Alcohol withdrawal protocol -->  Will follow  Levy Pupa, MD, PhD 04/24/2013, 11:31 AM [facility] [phone] or if no answer [phone]

## 2013-04-24 NOTE — Progress Notes (Signed)
Utilization review completed.  

## 2013-04-25 LAB — EXPECTORATED SPUTUM ASSESSMENT W GRAM STAIN, RFLX TO RESP C

## 2013-04-25 LAB — URINE CULTURE
Colony Count: NO GROWTH
Culture: NO GROWTH

## 2013-04-25 LAB — MPO/PR-3 (ANCA) ANTIBODIES: Serine Protease 3: 1 AU/mL (ref <20)

## 2013-04-25 LAB — LEGIONELLA ANTIGEN, URINE

## 2013-04-25 MED ORDER — POTASSIUM CHLORIDE CRYS ER 20 MEQ PO TBCR
EXTENDED_RELEASE_TABLET | ORAL | Status: AC
  Filled 2013-04-25: qty 1

## 2013-04-25 MED ORDER — POTASSIUM CHLORIDE CRYS ER 20 MEQ PO TBCR
EXTENDED_RELEASE_TABLET | ORAL | Status: AC
  Administered 2013-04-25: 20 meq
  Filled 2013-04-25: qty 2

## 2013-04-25 MED ORDER — METHIMAZOLE 10 MG PO TABS
20.0000 mg | ORAL_TABLET | Freq: Four times a day (QID) | ORAL | Status: DC
  Administered 2013-04-25 – 2013-04-26 (×4): 20 mg via ORAL
  Filled 2013-04-25 (×8): qty 2

## 2013-04-25 MED ORDER — METOPROLOL TARTRATE 25 MG PO TABS
25.0000 mg | ORAL_TABLET | Freq: Two times a day (BID) | ORAL | Status: DC
  Administered 2013-04-25 – 2013-04-26 (×3): 25 mg via ORAL
  Filled 2013-04-25 (×4): qty 1

## 2013-04-25 MED ORDER — CHLORDIAZEPOXIDE HCL 25 MG PO CAPS: 25.0000 mg | ORAL_CAPSULE | Freq: Four times a day (QID) | ORAL | Status: DC | PRN

## 2013-04-25 MED ORDER — POTASSIUM CHLORIDE CRYS ER 20 MEQ PO TBCR
40.0000 meq | EXTENDED_RELEASE_TABLET | Freq: Once | ORAL | Status: AC
  Administered 2013-04-25: 40 meq via ORAL

## 2013-04-25 MED ORDER — AZITHROMYCIN 500 MG PO TABS
500.0000 mg | ORAL_TABLET | Freq: Every day | ORAL | Status: DC
  Administered 2013-04-25: 500 mg via ORAL
  Filled 2013-04-25 (×2): qty 1

## 2013-04-25 NOTE — Progress Notes (Signed)
PULMONARY  / CRITICAL CARE MEDICINE  Name: Willie Owens MRN: [id number] DOB: [birth year]    ADMISSION DATE:  04/23/2013 CONSULTATION DATE:  04/23/2013  REFERRING MD :  [facility] PRIMARY SERVICE: [facility]  CHIEF COMPLAINT:  Dyspnea  BRIEF PATIENT DESCRIPTION: 62 yo without significant history admitted with dyspnea / cough and new onset AF.  Reported hematuria today, but none noted on UA in ED.  SIGNIFICANT EVENTS / STUDIES:  4/28  Chest CT angio >>> No PE, bilateral nodular airspace disease  LINES / TUBES:  CULTURES:  Blood 4/28 >>> Urine 4/28 >>  Quant -gold 4/30 >>   ANTIBIOTICS: Azithromycin  4/28 >>> Ceftriaxone  4/28 >>>  AUTO-IMMUNE: RF 4/29 >> 13 a-RNA Ab 4/29 >>  a-dsDNA Ab 4/29 >>  ANCA panel 4/29 >>  ANA 4/30 >>  a-scl 70 4/30 >>  a-centromere 4/30 >> a-Smith 4/30 >>  a-Jo 4/30 >>  a-Ro/SSA 4/30 >>  a-La/SSB 4/30 >>  a-CCP 4/30 >  Complement level 4/30 >>  HSP fungal panel 4/30 >>   HISTORY OF PRESENT ILLNESS:  62 yo without significant history presenting with dyspnea and cough with productive of clear sputum and hematuria x 1 day.  He reports taking ASA 81 mg 3 x week and Sudafed daily for nasal congestion since 5 years ago.  He smoked 2 ppd x 30 years but quit in 2006. He drinks Vodka daily with last drink 3 days ago. He reports no recent history of traveling outside of [place].  He had some diarrhea on Saturday. There was no nausea or vomiting.  He denies chest pain or palpitations.   SUBJECTIVE:  Feeling some better, still with cough has had some blood in clear sputum, also some epistaxis  VITAL SIGNS: Temp:  [97.6 F (36.4 C)-98.1 F (36.7 C)] 97.8 F (36.6 C) (04/30 0745) Pulse Rate:  [80-158] 80 (04/30 0700) Resp:  [31-39] 35 (04/30 0700) BP: (115-160)/(51-92) 115/56 mmHg (04/30 0700) SpO2:  [86 %-98 %] 92 % (04/30 0700) Weight:  [89.4 kg (197 lb 1.5 oz)] 89.4 kg (197 lb 1.5 oz) (04/30 0400)  HEMODYNAMICS:   VENTILATOR SETTINGS:   INTAKE /  OUTPUT: Intake/Output     04/29 0701 - 04/30 0700 04/30 0701 - 05/01 0700   P.O. 480    I.V. (mL/kg) 495.2 (5.5)    IV Piggyback 300    Total Intake(mL/kg) 1275.2 (14.3)    Urine (mL/kg/hr) 225 (0.1)    Total Output 225     Net +1050.2          Urine Occurrence 10 x     PHYSICAL EXAMINATION: General:  No acute distress, jittery Neuro:  Awake, alert, cooperative HEENT:  PERRL, moist membraanes Cardiovascular:  Irregularly irregular heart rate Lungs:  Bilateral air entry, scattered rales Abdomen:  Soft, nontender, nondistended Musculoskeletal:  Moves all extremities,no edema Skin:  Intact  LABS:  Recent Labs Lab 04/23/13 1730 04/23/13 1744 04/23/13 1745 04/24/13 0006 04/24/13 0201 04/24/13 0430 04/24/13 0830 04/24/13 0840 04/24/13 1550 04/25/13 0530  HGB  --  14.9  --   --   --  12.8*  --   --   --   --   WBC  --  18.8*  --   --   --  11.7*  --   --   --   --   PLT  --  170  --   --   --  150  --   --   --   --  NA  --  129*  --   --   --  130*  --   --   --   --   K  --  4.0  --   --   --  3.2*  --   --   --   --   CL  --  95*  --   --   --  98  --   --   --   --   CO2  --  22  --   --   --  22  --   --   --   --   GLUCOSE  --  102*  --   --   --  96  --   --   --   --   BUN  --  17  --   --   --  13  --   --   --   --   CREATININE  --  0.85  --   --   --  0.72  --   --   --   --   CALCIUM  --  9.4  --   --   --  8.8  --   --   --   --   MG  --   --   --   --   --   --   --  1.8  --   --   AST  --   --   --   --   --  23  --   --   --   --   ALT  --   --   --   --   --  20  --   --   --   --   ALKPHOS  --   --   --   --   --  91  --   --   --   --   BILITOT  --   --   --   --   --  3.4*  --   --   --   --   PROT  --   --   --   --   --  6.0  --   --   --   --   ALBUMIN  --   --   --   --   --  2.6*  --   --   --   --   APTT  --   --  32  --   --   --   --   --   --   --   INR  --   --  1.25  --   --   --   --   --   --   --   TROPONINI  --   --   --   --   <0.30  --  <0.30  --  <0.30  --   PROCALCITON  --   --   --  1.79  --   --   --   --   --  0.74  PROBNP 687.4*  --   --   --   --   --   --   --   --   --    No results found for this basename: GLUCAP,  in the last 168 hours  CXR: 4/28 >>>  LUL airspace  disease 2D echo 4/29 >> normal EF, some mild PAH but difficult to know significance as done in A fib  ASSESSMENT / PLAN:  B patchy infiltrates, suspect multifocal pneumonia (CAP, atypical) ; inconsistent w pulmonary edema. Other possibilities auto-immune, vasculitic or granulomatous disease. Pct equivical at 1.79 > 0.74, pneumococcal Ag negative. He has remote bird exposure hx (many yrs ago), has a possible mold exposure in home with water damage currently Atrial fibrillation with RVR Possible alcohol withdrawal Reported hematuria, but none noted on UA. Raises question pulmonary-renal syndrome  -->  Legionella urine Ag pending -->  Agree w Ceftriaxone / Azithromycin --> check HIV given possibility atypical or opportunistic infxn --> send quant gold assay --> send HSP fungal panel given the possible mold exposure -->  Agree with ANCA, pending -->  I expanded the auto-immune connective tissue disorder workup on 4/30, although appearance on CT scan is not in a UIP or NSIP pattern. I would be more suspicious of a vasculitic or granulomatous process here (if this isn't infection). Consider possible bx's if no clinical and radiographical  Improvement, either FOB or VATS -->  Cardizem gtt being weaned -->  Alcohol withdrawal protocol -->  Will follow w you. I can see him as outpt if w/u needs to continue post-discharge  Levy Pupa, MD, PhD 04/25/2013, 10:25 AM [facility] [phone] or if no answer [phone]

## 2013-04-25 NOTE — Progress Notes (Addendum)
[facility] Progress Note [place] TEAM 1 - Stepdown/ICU TEAM   REFORD OLLIFF AOZ:[id number] DOB: 1951/05/06 DOA: 04/23/2013 PCP: Herb Grays, MD  Brief narrative: 62 y/o male with no known past medical hx, daily etoh use and hx of heavy smoking for several years (quit in 2006) presented to PCP office for hematuria but was found to be tachycardic with EKG showing rapid Afib. Also had SOB with productive cough. CT chest revealed findings of ? multifocal pneumonia.   Assessment/Plan:  Atrial fibrillation with RVR Rate controlled on cardizem drip - CHADS2 score of 0  Severe thyrotoxicosis / Hyperthyroidism - newly diagnosed  Does not quite meet criteria for true thyroid storm per the Burch and Wartofsky scoring system - TSH very much suppressed (0.008) with markedly elevated T4 - cannot get RIU as patient received contrast for chest CT already - initiate BB in place of CCB - high dose tapazole - consider adding hydrocortisone if sx worsen, but will hold for now so as to not confuse ongoing pulmonary eval - check cortisol level in AM - weight loss of 35 lbs over 2 years - very restless on exam  Community acquired pneumonia - ?atypical PNA See extensive Pulmonary note - CT revealed B nodular airspace disease - is being tx for infection, with consideration being given to possible vasculitis, auto-immune, or granulomatous disease  Severe Bi-atrial enlargement on TTE Unclear etiology - perhaps related to pulm disease, or thyroid disease driven afib, or both - will need f/u echo once afib corrected, controlled to re-evaluate  Hematuria UA unremarkable - follow   Hypokalemia Replace and follow  ?Alcohol withdrawal Pt admits to drinking ~1 oz of vodka/day, with up to 3oz some days, and none whatsoever on other days - neither wife nor pt feel he drinks to excess - is not frequently "drunk" - I suspect the majority of his sx are actually related to is hyperthyroidism - cont CIWA for  now, but doubt precedex would be helpful - pt advised to avoid consumption of >5-7oz of EtOH within a 7 day period (>1oz/day)  Code Status: FULL Family Communication: spoke w/ pt and wife at bedside  Disposition Plan: SDU  Consultants: Pulmonary   Procedures: 4/29 - TTE - Systolic function was normal - ejection fraction was in the range of 55% to 60% - mod to severly dilated LA - massively dilated RA  Antibiotics: Rocephin 4/28 >> Azithrom 4/28 >>  DVT prophylaxis: SCDs   HPI/Subjective: Pt is markedly tremulous.  He denies diarrhea, confusion, lethargy, abdom pain, or pyrexia.  He denies sob, n/v, ha, or visual change.  Objective: Blood pressure 128/63, pulse 80, temperature 98.1 F (36.7 C), temperature source Oral, resp. rate 36, height 6\' 6"  (1.981 m), weight 89.4 kg (197 lb 1.5 oz), SpO2 93.00%.  Intake/Output Summary (Last 24 hours) at 04/25/13 1344 Last data filed at 04/25/13 1037  Gross per 24 hour  Intake 1198.9 ml  Output    475 ml  Net  723.9 ml   Exam: General: No acute respiratory distress - very tremulous - agitated  Lungs: Clear to auscultation bilaterally without wheezes or crackles Cardiovascular: irreg irreg w/o gallup, rub, or appreciable M Abdomen: Nontender, nondistended, soft, bowel sounds positive, no rebound, no ascites, no appreciable mass Extremities: No significant cyanosis, clubbing, or edema bilateral lower extremities  Data Reviewed: Basic Metabolic Panel:  Recent Labs Lab 04/23/13 1744 04/24/13 0430 04/24/13 0840  NA 129* 130*  --   K 4.0 3.2*  --  CL 95* 98  --   CO2 22 22  --   GLUCOSE 102* 96  --   BUN 17 13  --   CREATININE 0.85 0.72  --   CALCIUM 9.4 8.8  --   MG  --   --  1.8   Liver Function Tests:  Recent Labs Lab 04/24/13 0430  AST 23  ALT 20  ALKPHOS 91  BILITOT 3.4*  PROT 6.0  ALBUMIN 2.6*   CBC:  Recent Labs Lab 04/23/13 1744 04/24/13 0430  WBC 18.8* 11.7*  NEUTROABS  --  8.3*  HGB 14.9 12.8*   HCT 41.7 36.9*  MCV 84.8 85.4  PLT 170 150   Cardiac Enzymes:  Recent Labs Lab 04/24/13 0201 04/24/13 0830 04/24/13 1550  TROPONINI <0.30 <0.30 <0.30   BNP (last 3 results)  Recent Labs  04/23/13 1730  PROBNP 687.4*    Recent Results (from the past 240 hour(s))  CULTURE, BLOOD (ROUTINE X 2)     Status: None   Collection Time    04/23/13  9:20 PM      Result Value Range Status   Specimen Description BLOOD ARM LEFT   Final   Special Requests BOTTLES DRAWN AEROBIC AND ANAEROBIC 10CC   Final   Culture  Setup Time 04/24/2013 02:40   Final   Culture     Final   Value:        BLOOD CULTURE RECEIVED NO GROWTH TO DATE CULTURE WILL BE HELD FOR 5 DAYS BEFORE ISSUING A FINAL NEGATIVE REPORT   Report Status PENDING   Incomplete  CULTURE, BLOOD (ROUTINE X 2)     Status: None   Collection Time    04/23/13  9:35 PM      Result Value Range Status   Specimen Description BLOOD ARM LEFT   Final   Special Requests BOTTLES DRAWN AEROBIC AND ANAEROBIC 10CC   Final   Culture  Setup Time 04/24/2013 02:33   Final   Culture     Final   Value:        BLOOD CULTURE RECEIVED NO GROWTH TO DATE CULTURE WILL BE HELD FOR 5 DAYS BEFORE ISSUING A FINAL NEGATIVE REPORT   Report Status PENDING   Incomplete  MRSA PCR SCREENING     Status: None   Collection Time    04/24/13  1:52 AM      Result Value Range Status   MRSA by PCR NEGATIVE  NEGATIVE Final   Comment:            The GeneXpert MRSA Assay (FDA     approved for NASAL specimens     only), is one component of a     comprehensive MRSA colonization     surveillance program. It is not     intended to diagnose MRSA     infection nor to guide or     monitor treatment for     MRSA infections.     Studies:  Recent x-ray studies have been reviewed in detail by the Attending Physician  Scheduled Meds:  Scheduled Meds: . azithromycin  500 mg Oral QHS  . cefTRIAXone (ROCEPHIN)  IV  1 g Intravenous Q24H  . chlordiazePOXIDE  25 mg Oral Q8H  .  diltiazem  30 mg Oral Q8H  . folic acid  1 mg Oral Daily  . LORazepam  0-4 mg Oral Q6H   Followed by  . [START ON 04/26/2013] LORazepam  0-4 mg Oral Q12H  .  multivitamin with minerals  1 tablet Oral Daily  . sodium chloride  3 mL Intravenous Q12H  . thiamine  100 mg Oral Daily   Continuous Infusions: . sodium chloride 10 mL/hr at 04/25/13 1100  . diltiazem (CARDIZEM) infusion 5 mg/hr (04/25/13 1232)    Time spent on care of this patient:   [facility] T  [facility] Office  [phone] Pager - Text Page per Loretha Stapler as per below:  On-Call/Text Page:      [email or web address]      password [email or web address]  If 7PM-7AM, please contact night-coverage [email or web address] Password [email or web address] 04/25/2013, 1:44 PM   LOS: 2 days

## 2013-04-26 ENCOUNTER — Telehealth: Payer: Self-pay | Admitting: Emergency Medicine

## 2013-04-26 DIAGNOSIS — E0590 Thyrotoxicosis, unspecified without thyrotoxic crisis or storm: Secondary | ICD-10-CM

## 2013-04-26 LAB — CYCLIC CITRUL PEPTIDE ANTIBODY, IGG: Cyclic Citrullin Peptide Ab: <2 U/mL (ref 0.0–5.0)

## 2013-04-26 LAB — COMPREHENSIVE METABOLIC PANEL
ALT: 28 U/L (ref 0–53)
AST: 26 U/L (ref 0–37)
Albumin: 2.4 g/dL — ABNORMAL LOW (ref 3.5–5.2)
Alkaline Phosphatase: 96 U/L (ref 39–117)
CO2: 26 mEq/L (ref 19–32)
Chloride: 101 mEq/L (ref 96–112)
Creatinine, Ser: 0.67 mg/dL (ref 0.50–1.35)
GFR calc non Af Amer: >90 mL/min (ref >90)
Potassium: 3.5 mEq/L (ref 3.5–5.1)
Total Bilirubin: 1.2 mg/dL (ref 0.3–1.2)

## 2013-04-26 LAB — CBC
MCH: 29.9 pg (ref 26.0–34.0)
MCHC: 34.6 g/dL (ref 30.0–36.0)
MCV: 86.4 fL (ref 78.0–100.0)
Platelets: 176 10*3/uL (ref 150–400)
RBC: 4.28 MIL/uL (ref 4.22–5.81)
RDW: 13 % (ref 11.5–15.5)

## 2013-04-26 LAB — MAGNESIUM: Magnesium: 1.7 mg/dL (ref 1.5–2.5)

## 2013-04-26 LAB — JO-1 ANTIBODY-IGG: Jo-1 Antibody, IgG: 2 AU/mL (ref <30)

## 2013-04-26 LAB — CORTISOL: Cortisol, Plasma: 11.3 ug/dL

## 2013-04-26 LAB — ANTI-SMITH ANTIBODY: ENA SM Ab Ser-aCnc: 3 AU/mL (ref <30)

## 2013-04-26 LAB — ANCA SCREEN W REFLEX TITER
Atypical p-ANCA Screen: POSITIVE — AB
p-ANCA Screen: NEGATIVE

## 2013-04-26 LAB — CENTROMERE ANTIBODIES: Centromere Ab Screen: 4 AU/mL (ref <30)

## 2013-04-26 MED ORDER — METOPROLOL SUCCINATE ER 25 MG PO TB24: 25.0000 mg | ORAL_TABLET | Freq: Every day | ORAL | Status: DC

## 2013-04-26 MED ORDER — METHIMAZOLE 10 MG PO TABS
40.0000 mg | ORAL_TABLET | Freq: Once | ORAL | Status: DC
  Filled 2013-04-26: qty 4

## 2013-04-26 MED ORDER — DILTIAZEM HCL ER COATED BEADS 120 MG PO CP24: 120.0000 mg | ORAL_CAPSULE | Freq: Every day | ORAL | Status: DC

## 2013-04-26 MED ORDER — METOPROLOL SUCCINATE ER 25 MG PO TB24
25.0000 mg | ORAL_TABLET | Freq: Every day | ORAL | Status: DC
  Filled 2013-04-26: qty 1

## 2013-04-26 MED ORDER — DILTIAZEM HCL ER COATED BEADS 120 MG PO CP24
120.0000 mg | ORAL_CAPSULE | Freq: Every day | ORAL | Status: DC
  Administered 2013-04-26: 120 mg via ORAL
  Filled 2013-04-26: qty 1

## 2013-04-26 MED ORDER — LEVOFLOXACIN 750 MG PO TABS: 750.0000 mg | ORAL_TABLET | Freq: Every day | ORAL | Status: DC

## 2013-04-26 MED ORDER — LEVOFLOXACIN 750 MG PO TABS
750.0000 mg | ORAL_TABLET | Freq: Every day | ORAL | Status: DC
  Filled 2013-04-26: qty 1

## 2013-04-26 MED ORDER — METHIMAZOLE 10 MG PO TABS
60.0000 mg | ORAL_TABLET | Freq: Every day | ORAL | Status: DC
  Administered 2013-04-26: 60 mg via ORAL
  Filled 2013-04-26: qty 6

## 2013-04-26 MED ORDER — METHIMAZOLE 10 MG PO TABS: 60.0000 mg | ORAL_TABLET | Freq: Every day | ORAL | Status: DC

## 2013-04-26 NOTE — Care Management Note (Signed)
    Page 1 of 1   04/26/2013     3:49:11 PM   CARE MANAGEMENT NOTE 04/26/2013  Patient:  Willie Owens, Willie Owens   Account Number:  [id number]  Date Initiated:  04/24/2013  Documentation initiated by:  Donn Pierini  Subjective/Objective Assessment:   Pt admitted with afib RVR     Action/Plan:   PTA pt lived at home with spouse- NCM to follow pt progression for d/c needs- anticipate return home   Anticipated DC Date:  04/26/2013   Anticipated DC Plan:  HOME/SELF CARE      DC Planning Services  CM consult      Choice offered to / List presented to:             Status of service:  Completed, signed off Medicare Important Message given?   (If response is "NO", the following Medicare IM given date fields will be blank) Date Medicare IM given:   Date Additional Medicare IM given:    Discharge Disposition:  HOME/SELF CARE  Per UR Regulation:  Reviewed for med. necessity/level of care/duration of stay  If discussed at Long Length of Stay Meetings, dates discussed:    Comments:

## 2013-04-26 NOTE — Discharge Summary (Addendum)
Physician Discharge Summary  Willie Owens:[id number] DOB: Apr 26, 1951 DOA: 04/23/2013  PCP: Herb Grays, MD  Admit date: 04/23/2013 Discharge date: 04/26/2013  Time spent: >45 minutes  Recommendations for Outpatient Follow-up:  1. Repeat ECHO in 1 month   Discharge Diagnoses:  Principal Problem:   Atrial fibrillation with RVR Active Problems:   Community acquired pneumonia   Hematuria   Hyperthyroidism   ANA positive- further w/u as oupt   Discharge Condition: stable  Diet recommendation: heart healthy  Filed Weights   04/24/13 0203 04/25/13 0400 04/26/13 0500  Weight: 88.1 kg (194 lb 3.6 oz) 89.4 kg (197 lb 1.5 oz) 86.4 kg (190 lb 7.6 oz)    History of present illness:  62 y/o male with no known past medical hx, daily etoh use and hx of heavy smoking for several years (quit in 2006) presented to PCP office for hematuria but was found to be tachycardic with EKG showing rapid Afib. Also had SOB with productive cough. CT chest revealed findings of ? multifocal pneumonia.   Hospital Course:  Atrial fibrillation with RVR  Rate controlled with Cardizem and metoprolol - CHADS2 score of 0   Severe thyrotoxicosis / Hyperthyroidism - newly diagnosed  Does not quite meet criteria for true thyroid storm per the Burch and Wartofsky scoring system - TSH very much suppressed (0.008) with markedly elevated T4 - cannot get RIU as patient received contrast for chest CT already - initiatedBB in addition to CCB - high dose tapazole  -60 mg daily d/w Dr Sharl Ma [facility] endocrinology) AM Cortisol was 11-- weight loss of 35 lbs over 2 years - less restless on exam today  Community acquired pneumonia - ?atypical PNA   - CT revealed B nodular airspace disease - is being treated for infection, with consideration being given to possible vasculitis, auto-immune, or granulomatous disease  -ANA positive- will treat with Levaquin for 10 days and then will have him f/u with Dr Delton Coombes - pulm  Severe  Bi-atrial enlargement on TTE  Unclear etiology - perhaps related to pulm disease, or thyroid disease driven afib, or both - will need f/u echo once afib corrected, controlled to re-evaluate   Hematuria  UA unremarkable - follow   Hypokalemia  Replace and follow   ?Alcohol withdrawal  Pt admits to drinking ~1 oz of vodka/day, with up to 3oz some days, and none whatsoever on other days - neither wife nor pt feel he drinks to excess - is not frequently "drunk" - I suspect the majority of his sx are actually related to is hyperthyroidism - cont CIWA for now, but doubt precedex would be helpful - pt advised to avoid consumption of >5-7oz of EtOH within a 7 day period (>1oz/day)  - has not been in withdrawal while in hospital  Consultations:  Pulm- Dr Delton Coombes  Discharge Exam: Filed Vitals:   04/26/13 0800 04/26/13 0954 04/26/13 1134 04/26/13 1200  BP: 129/76 129/76  131/70  Pulse:  97    Temp: 98.2 F (36.8 C)  98.2 F (36.8 C) 98.2 F (36.8 C)  TempSrc: Oral  Oral Oral  Resp:      Height:      Weight:      SpO2:        General: AAO x 3, No acute disress Cardiovascular: RRR, no murmurs Respiratory: CTA b/l   Discharge Instructions  Discharge Orders   Future Orders Complete By Expires     Diet - low sodium heart healthy  As directed  Increase activity slowly  As directed         Medication List    STOP taking these medications       pseudoephedrine 30 MG tablet  Commonly known as:  SUDAFED      TAKE these medications       ADVIL PM PO  Take 2 tablets by mouth every evening.     aspirin EC 81 MG tablet  Take 81 mg by mouth 3 (three) times a week.     diltiazem 120 MG 24 hr capsule  Commonly known as:  CARDIZEM CD  Take 1 capsule (120 mg total) by mouth daily.     diphenhydrAMINE 25 MG tablet  Commonly known as:  BENADRYL  Take 25 mg by mouth every evening.     levofloxacin 750 MG tablet  Commonly known as:  LEVAQUIN  Take 1 tablet (750 mg total) by  mouth daily.     methimazole 10 MG tablet  Commonly known as:  TAPAZOLE  Take 6 tablets (60 mg total) by mouth daily.     metoprolol succinate 25 MG 24 hr tablet  Commonly known as:  TOPROL-XL  Take 1 tablet (25 mg total) by mouth daily.     multivitamin with minerals Tabs  Take 1 tablet by mouth daily.     SUPER B COMPLEX PO  Take 1 tablet by mouth daily.       Allergies  Allergen Reactions  . Penicillins Other (See Comments)    Hallucinations, from childhood       Follow-up Information   Follow up with KERR,JEFFREY, MD. (Monday May 5th, 12 PM)    Contact information:   [address] [facility] [place] [address] [phone]       Follow up with Leslye Peer., MD. Schedule an appointment as soon as possible for a visit in 2 weeks.   Contact information:   [address] [place] [address] [phone]       Follow up with Herb Grays, MD. Schedule an appointment as soon as possible for a visit in 1 week.   Contact information:   [address] [place] [address] [phone]        The results of significant diagnostics from this hospitalization (including imaging, microbiology, ancillary and laboratory) are listed below for reference.    Significant Diagnostic Studies: Ct Angio Chest W/cm &/or Wo Cm  04/23/2013  *RADIOLOGY REPORT*  Clinical Data: Shortness of breath and hematuria.  Tachycardia with atrial fibrillation.  No given history of malignancy.  CT ANGIOGRAPHY CHEST  Technique:  Multidetector CT imaging of the chest using the standard protocol during bolus administration of intravenous contrast. Multiplanar reconstructed images including MIPs were obtained and reviewed to evaluate the vascular anatomy.  Contrast: OMNIPAQUE IOHEXOL 350 MG/ML SOLN  Comparison: Chest radiographs 04/23/2013.  Findings: The pulmonary arteries are well opacified with contrast. There is moderate  breathing artifact which limits evaluation of the lower lobe vessels.  However, no pulmonary emboli are identified. There are small subcarinal and hilar lymph nodes which do not appear pathologically enlarged.  There is a small hiatal hernia. Coronary artery calcifications are noted.  As demonstrated radiographically, there is multifocal ill-defined nodularity throughout the left lung, involving the upper and lower lobes.  This has a perihilar distribution.  In addition, there is lesser involvement within the right lung, primarily in the lower lobe.  There is no endobronchial  lesion.  There is no significant pleural or pericardial effusion.  Images through the upper abdomen demonstrate hepatic low density consistent with steatosis.  There are no worrisome osseous findings.  IMPRESSION:  1.  No evidence of acute pulmonary embolism. 2.  Diffuse ill-defined nodularity throughout both lungs, greater on the left.  This pattern is atypical for community-acquired pneumonia.  Consider septic emboli and atypical pneumonia in this patient with leukocytosis.  Follow-up is necessary to exclude atypical malignancy.   Original Report Authenticated By: Carey Bullocks, M.D.    US Abdomen Complete  04/24/2013  *RADIOLOGY REPORT*  Clinical Data:  Hematuria.  History of lung nodules.  Alcohol abuse.  Elevated bilirubin.  Atrial fibrillation.  COMPLETE ABDOMINAL ULTRASOUND  Comparison:  No comparison abdominal or pelvic ultrasound/CT. 04/23/2013 chest x-ray is available.  Findings:  Gallbladder:  Gallbladder sludge.  Gallbladder wall thickening measuring up to 4.1 mm.  Trace pericholecystic fluid.  The patient was not tender over this region during scanning per ultrasound technologist.  Common bile duct:  3.1 mm.  Liver:  Mild increased echogenicity suggestive of mild fatty infiltration without focal lesion identified.  Trace peri hepatic fluid.  IVC:  Appears normal.  Pancreas:  Slightly limited evaluation secondary to bowel gas  without discrete mass identified.  Spleen:  8.8 cm.  No focal lesion.  Right Kidney:  12.0 cm. No hydronephrosis or renal mass.  Left Kidney:  12.6 cm. No hydronephrosis or renal mass.  Abdominal aorta:  No abdominal aortic aneurysm.  2.1 cm maximal AP dimension.  Small left-sided pleural effusion noted.  IMPRESSION: Gallbladder sludge with mild gallbladder wall thickening.  Minimal pericholecystic fluid.  The patient not tender over this region during scanning.  This appearance of the gallbladder may reflect result of metabolic abnormality rather than inflammation.  Mild fatty infiltration of liver.  No renal mass or renal calculi identified as cause of patient's hematuria.  Small left-sided pleural effusion.  Slightly limited evaluation of the pancreas secondary to bowel gas.   Original Report Authenticated By: Lacy Duverney, M.D.    Dg Chest Port 1 View  04/23/2013  *RADIOLOGY REPORT*  Clinical Data: Chest pain.  Short of breath.  Tachycardia.  PORTABLE CHEST - 1 VIEW  Comparison: None.  Findings: Patchy airspace opacities present in the left lung. Nodular density is present at the left apex which may represent some more focal area of consolidation.  Underlying malignancy/pulmonary nodule cannot be excluded. Follow-up to ensure radiographic clearing recommended.  Clearing is usually observed at 8 weeks. Emphysematous changes are present.  There is no pleural effusion identified. Monitoring leads are projected over the chest. Cardiopericardial silhouette appears within normal limits. No plain film evidence of adenopathy on this single frontal view.  IMPRESSION: Left upper lobe pneumonia.  Follow-up to ensure clearing recommended.  Emphysema.   Original Report Authenticated By: Andreas Newport, M.D.     Microbiology: Recent Results (from the past 240 hour(s))  CULTURE, BLOOD (ROUTINE X 2)     Status: None   Collection Time    04/23/13  9:20 PM      Result Value Range Status   Specimen Description BLOOD  ARM LEFT   Final   Special Requests BOTTLES DRAWN AEROBIC AND ANAEROBIC 10CC   Final   Culture  Setup Time 04/24/2013 02:40   Final   Culture     Final   Value:        BLOOD CULTURE RECEIVED NO GROWTH TO DATE CULTURE WILL BE HELD FOR  5 DAYS BEFORE ISSUING A FINAL NEGATIVE REPORT   Report Status PENDING   Incomplete  CULTURE, BLOOD (ROUTINE X 2)     Status: None   Collection Time    04/23/13  9:35 PM      Result Value Range Status   Specimen Description BLOOD ARM LEFT   Final   Special Requests BOTTLES DRAWN AEROBIC AND ANAEROBIC 10CC   Final   Culture  Setup Time 04/24/2013 02:33   Final   Culture     Final   Value:        BLOOD CULTURE RECEIVED NO GROWTH TO DATE CULTURE WILL BE HELD FOR 5 DAYS BEFORE ISSUING A FINAL NEGATIVE REPORT   Report Status PENDING   Incomplete  URINE CULTURE     Status: None   Collection Time    04/24/13  1:27 AM      Result Value Range Status   Specimen Description URINE, CLEAN CATCH   Final   Special Requests NONE   Final   Culture  Setup Time 04/24/2013 02:07   Final   Colony Count NO GROWTH   Final   Culture NO GROWTH   Final   Report Status 04/25/2013 FINAL   Final  MRSA PCR SCREENING     Status: None   Collection Time    04/24/13  1:52 AM      Result Value Range Status   MRSA by PCR NEGATIVE  NEGATIVE Final   Comment:            The GeneXpert MRSA Assay (FDA     approved for NASAL specimens     only), is one component of a     comprehensive MRSA colonization     surveillance program. It is not     intended to diagnose MRSA     infection nor to guide or     monitor treatment for     MRSA infections.  CULTURE, EXPECTORATED SPUTUM-ASSESSMENT     Status: None   Collection Time    04/25/13  4:10 PM      Result Value Range Status   Specimen Description SPUTUM   Final   Special Requests Normal   Final   Sputum evaluation     Final   Value: MICROSCOPIC FINDINGS SUGGEST THAT THIS SPECIMEN IS NOT REPRESENTATIVE OF LOWER RESPIRATORY SECRETIONS.  PLEASE RECOLLECT.   Report Status 04/25/2013 FINAL   Final     Labs: Basic Metabolic Panel:  Recent Labs Lab 04/23/13 1744 04/24/13 0430 04/24/13 0840 04/26/13 0425  NA 129* 130*  --  135  K 4.0 3.2*  --  3.5  CL 95* 98  --  101  CO2 22 22  --  26  GLUCOSE 102* 96  --  133*  BUN 17 13  --  8  CREATININE 0.85 0.72  --  0.67  CALCIUM 9.4 8.8  --  9.0  MG  --   --  1.8 1.7   Liver Function Tests:  Recent Labs Lab 04/24/13 0430 04/26/13 0425  AST 23 26  ALT 20 28  ALKPHOS 91 96  BILITOT 3.4* 1.2  PROT 6.0 6.1  ALBUMIN 2.6* 2.4*   No results found for this basename: LIPASE, AMYLASE,  in the last 168 hours No results found for this basename: AMMONIA,  in the last 168 hours CBC:  Recent Labs Lab 04/23/13 1744 04/24/13 0430 04/26/13 0425  WBC 18.8* 11.7* 8.0  NEUTROABS  --  8.3*  --  HGB 14.9 12.8* 12.8*  HCT 41.7 36.9* 37.0*  MCV 84.8 85.4 86.4  PLT 170 150 176   Cardiac Enzymes:  Recent Labs Lab 04/24/13 0201 04/24/13 0830 04/24/13 1550  TROPONINI <0.30 <0.30 <0.30   BNP: BNP (last 3 results)  Recent Labs  04/23/13 1730  PROBNP 687.4*   CBG: No results found for this basename: GLUCAP,  in the last 168 hours     Signed:  Kemya Shed  [facility] 04/26/2013, 2:13 PM

## 2013-04-26 NOTE — Telephone Encounter (Signed)
I spoke with daughter in law. Pt has already been released from the hospital. She needed nothing further

## 2013-04-26 NOTE — Progress Notes (Signed)
Patient being discharged home per MD order.Alert and oriented. All vital signs with in normal limits. Patient given all discharge instructions and voiced understanding of them. Gave patients wife his RX to get filled. Patient went home with his brother in law.

## 2013-04-27 LAB — COMPLEMENT, TOTAL: Compl, Total (CH50): 49 U/mL (ref 31–60)

## 2013-04-27 LAB — QUANTIFERON TB GOLD ASSAY (BLOOD): TB Antigen Minus Nil Value: 0.01 IU/mL

## 2013-04-30 ENCOUNTER — Telehealth: Payer: Self-pay

## 2013-04-30 LAB — CULTURE, BLOOD (ROUTINE X 2): Culture: NO GROWTH

## 2013-04-30 NOTE — Telephone Encounter (Signed)
Pt was d/c and advised needs hfu in 2 weeks w/ RB. Can he be worked in [employer] scheduled. Please advise thanks

## 2013-05-01 NOTE — Telephone Encounter (Signed)
I scheduled a HFU w/ RB for 05/04/13 @ 3:30 PM when an opening became available.  Per spouse, nothing further needed at this time.  Willie Owens

## 2013-05-02 LAB — HYPERSENSITIVITY PNUEMONITIS PROFILE

## 2013-05-02 NOTE — Telephone Encounter (Signed)
Thanks

## 2013-05-04 ENCOUNTER — Encounter: Payer: Self-pay | Admitting: Emergency Medicine

## 2013-05-04 ENCOUNTER — Ambulatory Visit (INDEPENDENT_AMBULATORY_CARE_PROVIDER_SITE_OTHER): Payer: BC Managed Care – PPO | Admitting: Emergency Medicine

## 2013-05-04 VITALS — BP 138/70 | HR 111 | Ht 76.5 in | Wt 187.6 lb

## 2013-05-04 DIAGNOSIS — I4891 Unspecified atrial fibrillation: Secondary | ICD-10-CM

## 2013-05-04 DIAGNOSIS — R918 Other nonspecific abnormal finding of lung field: Secondary | ICD-10-CM | POA: Insufficient documentation

## 2013-05-04 DIAGNOSIS — J449 Chronic obstructive pulmonary disease, unspecified: Secondary | ICD-10-CM | POA: Insufficient documentation

## 2013-05-04 DIAGNOSIS — F17201 Nicotine dependence, unspecified, in remission: Secondary | ICD-10-CM

## 2013-05-04 DIAGNOSIS — Z87891 Personal history of nicotine dependence: Secondary | ICD-10-CM

## 2013-05-04 DIAGNOSIS — E0590 Thyrotoxicosis, unspecified without thyrotoxic crisis or storm: Secondary | ICD-10-CM

## 2013-05-04 NOTE — Assessment & Plan Note (Signed)
Remains in A fib, planning f/u with Dr Rennis Golden

## 2013-05-04 NOTE — Progress Notes (Signed)
HPI:  62 yo man, hx EtOH abuse, former tobacco (60 pk-yrs), was admitted recently w hematuria, dyspnea and a productive cough. Newly dx with A fib + RVR on presentation. His eval revealed (new) hyperthyroidism (started on tapazole).  A CT scan of his chest was performed 4/28, showed no PE but multifocal L > R ill-defined nodular infiltrates. He was treated for suspected CAP and PCCM was consulted to consider other etiologies. An auto-immune and exposure w/u was largely negative w exception of weak ANA and atypical p-ANCA (both non-specific). He presents today for f/u of the hospitalization.  He finished course today. Reports that his cough and SOB are unchanged. He has seen [facility], plans to see Dr Rennis Golden w [facility]  Labs in house:  Quant gold >> negative HIV >> negative HSP fungal Ab's >> all negative Autoimmune labs including SSA (Ro), SSB (La), a-Smith, a-Jo-1, a-centromere, a-CCP, a-RNA, a-dsDNA were ALL NEGATIVE Complement >> normal RF >> normal ANA >> positive speckled at 1:40 ANCA >> positive in atypical pattern at 1:20     Past Medical History  Diagnosis Date  . Medical history non-contributory      Family History  Problem Relation Age of Onset  . Pulmonary embolism Neg Hx   . CAD Neg Hx      History   Social History  . Marital Status: Married    Spouse Name: N/A    Number of Children: N/A  . Years of Education: N/A   Occupational History  . Not on file.   Social History Main Topics  . Smoking status: Former Smoker -- 2.00 packs/day for 30 years    Types: Cigarettes    Quit date: 12/30/2004  . Smokeless tobacco: Never Used  . Alcohol Use: Yes     Comment: daily  . Drug Use: No  . Sexually Active: Not on file   Other Topics Concern  . Not on file   Social History Narrative  . No narrative on file     Allergies  Allergen Reactions  . Penicillins Other (See Comments)    Hallucinations, from childhood     Outpatient Prescriptions Prior to Visit   Medication Sig Dispense Refill  . aspirin EC 81 MG tablet Take 81 mg by mouth 3 (three) times a week.      . B Complex-C (SUPER B COMPLEX PO) Take 1 tablet by mouth daily.      Marland Kitchen diltiazem (CARDIZEM CD) 120 MG 24 hr capsule Take 1 capsule (120 mg total) by mouth daily.  30 capsule  0  . diphenhydrAMINE (BENADRYL) 25 MG tablet Take 25 mg by mouth every evening.      . Ibuprofen-Diphenhydramine Cit (ADVIL PM PO) Take 2 tablets by mouth as needed.       . methimazole (TAPAZOLE) 10 MG tablet Take 6 tablets (60 mg total) by mouth daily.  30 tablet  0  . metoprolol succinate (TOPROL-XL) 25 MG 24 hr tablet Take 1 tablet (25 mg total) by mouth daily.  30 tablet  0  . Multiple Vitamin (MULTIVITAMIN WITH MINERALS) TABS Take 1 tablet by mouth daily.      Marland Kitchen levofloxacin (LEVAQUIN) 750 MG tablet Take 1 tablet (750 mg total) by mouth daily.  8 tablet  0   No facility-administered medications prior to visit.   Filed Vitals:   05/04/13 1527  BP: 138/70  Pulse: 111  Height: 6' 4.5" (1.943 m)  Weight: 187 lb 9.6 oz (85.095 kg)  SpO2: 97%  Gen: Pleasant, well-nourished, in no distress,  normal affect  ENT: No lesions,  mouth clear,  oropharynx clear, no postnasal drip  Neck: No JVD, no TMG, no carotid bruits  Lungs: No use of accessory muscles, no dullness to percussion, clear without rales or rhonchi  Cardiovascular: RRR, heart sounds normal, no murmur or gallops, no peripheral edema  Musculoskeletal: No deformities, no cyanosis or clubbing  Neuro: alert, non focal  Skin: Warm, no lesions or rashes    CT chest 04/23/13 --  Comparison: Chest radiographs 04/23/2013.  Findings: The pulmonary arteries are well opacified with contrast.  There is moderate breathing artifact which limits evaluation of the  lower lobe vessels. However, no pulmonary emboli are identified.  There are small subcarinal and hilar lymph nodes which do not  appear pathologically enlarged. There is a small hiatal  hernia.  Coronary artery calcifications are noted.  As demonstrated radiographically, there is multifocal ill-defined  nodularity throughout the left lung, involving the upper and lower  lobes. This has a perihilar distribution. In addition, there is  lesser involvement within the right lung, primarily in the lower  lobe. There is no endobronchial lesion. There is no significant  pleural or pericardial effusion.  Images through the upper abdomen demonstrate hepatic low density  consistent with steatosis. There are no worrisome osseous  findings.  IMPRESSION:  1. No evidence of acute pulmonary embolism.  2. Diffuse ill-defined nodularity throughout both lungs, greater  on the left. This pattern is atypical for community-acquired  pneumonia. Consider septic emboli and atypical pneumonia in this  patient with leukocytosis. Follow-up is necessary to exclude  atypical malignancy  Pulmonary nodules Etiology unclear, could have been atypical or viral PNA. He was treated with abx, still has same dyspnea and cough. Auto-immune panel and fungal panel are negative. Weak ANA and atypical ANCA may relate to new dx of hyperthyroidism??  - will repeat CT scan chest 6 weeks after initial scan - if the infiltrates are still present then will w/u further either by FOB or VATS bx   Tobacco abuse, in remission - full PFT at his ROV  Atrial fibrillation with RVR Remains in A fib, planning f/u with Dr Rennis Golden  Hyperthyroidism Following w [facility], auto-immune labs sent and pending. ? Preparing for radioablation

## 2013-05-04 NOTE — Patient Instructions (Addendum)
We will perform a CT scan of the chest in mid-June Please follow with Dr Delton Coombes in June after your CT scan. We will perform full Pulmonary Function Testing on the same day as your follow up visit.

## 2013-05-04 NOTE — Assessment & Plan Note (Addendum)
Etiology unclear, could have been atypical or viral PNA. He was treated with abx, still has same dyspnea and cough. Auto-immune panel and fungal panel are negative. Weak ANA and atypical ANCA may relate to new dx of hyperthyroidism??  - will repeat CT scan chest 6 weeks after initial scan - if the infiltrates are still present then will w/u further either by FOB or VATS bx

## 2013-05-04 NOTE — Assessment & Plan Note (Signed)
Following w [facility], auto-immune labs sent and pending. ? Preparing for radioablation

## 2013-05-04 NOTE — Assessment & Plan Note (Signed)
-   full PFT at his ROV

## 2013-05-08 ENCOUNTER — Other Ambulatory Visit (HOSPITAL_COMMUNITY): Payer: Self-pay | Admitting: Internal Medicine

## 2013-05-08 DIAGNOSIS — I4891 Unspecified atrial fibrillation: Secondary | ICD-10-CM | POA: Insufficient documentation

## 2013-05-08 DIAGNOSIS — R0600 Dyspnea, unspecified: Secondary | ICD-10-CM

## 2013-05-08 DIAGNOSIS — M109 Gout, unspecified: Secondary | ICD-10-CM | POA: Insufficient documentation

## 2013-05-09 DIAGNOSIS — I517 Cardiomegaly: Secondary | ICD-10-CM | POA: Insufficient documentation

## 2013-05-09 DIAGNOSIS — K760 Fatty (change of) liver, not elsewhere classified: Secondary | ICD-10-CM | POA: Insufficient documentation

## 2013-05-09 DIAGNOSIS — R918 Other nonspecific abnormal finding of lung field: Secondary | ICD-10-CM | POA: Insufficient documentation

## 2013-05-25 ENCOUNTER — Telehealth: Payer: Self-pay | Admitting: Internal Medicine

## 2013-05-25 ENCOUNTER — Ambulatory Visit (HOSPITAL_COMMUNITY)
Admission: RE | Admit: 2013-05-25 | Discharge: 2013-05-25 | Disposition: A | Discharge status: Home / Self Care | Payer: BC Managed Care – PPO | Source: Ambulatory Visit | Attending: Cardiovascular Disease | Admitting: Cardiovascular Disease

## 2013-05-25 DIAGNOSIS — R0602 Shortness of breath: Secondary | ICD-10-CM | POA: Insufficient documentation

## 2013-05-25 DIAGNOSIS — R0609 Other forms of dyspnea: Secondary | ICD-10-CM

## 2013-05-25 DIAGNOSIS — I4891 Unspecified atrial fibrillation: Secondary | ICD-10-CM | POA: Insufficient documentation

## 2013-05-25 DIAGNOSIS — I4510 Unspecified right bundle-branch block: Secondary | ICD-10-CM | POA: Insufficient documentation

## 2013-05-25 DIAGNOSIS — R5383 Other fatigue: Secondary | ICD-10-CM | POA: Insufficient documentation

## 2013-05-25 DIAGNOSIS — R5381 Other malaise: Secondary | ICD-10-CM | POA: Insufficient documentation

## 2013-05-25 DIAGNOSIS — R0600 Dyspnea, unspecified: Secondary | ICD-10-CM

## 2013-05-25 DIAGNOSIS — R0989 Other specified symptoms and signs involving the circulatory and respiratory systems: Secondary | ICD-10-CM

## 2013-05-25 MED ORDER — TECHNETIUM TC 99M SESTAMIBI GENERIC - CARDIOLITE
30.8000 | Freq: Once | INTRAVENOUS | Status: AC | PRN
  Administered 2013-05-25: 30.8 via INTRAVENOUS

## 2013-05-25 MED ORDER — TECHNETIUM TC 99M SESTAMIBI GENERIC - CARDIOLITE
10.7000 | Freq: Once | INTRAVENOUS | Status: AC | PRN
  Administered 2013-05-25: 11 via INTRAVENOUS

## 2013-05-25 MED ORDER — REGADENOSON 0.4 MG/5ML IV SOLN
0.4000 mg | Freq: Once | INTRAVENOUS | Status: AC
Start: 2013-05-25 — End: 2013-05-25
  Administered 2013-05-25: 0.4 mg via INTRAVENOUS

## 2013-05-25 NOTE — Procedures (Addendum)
[facility] [address] [place] [address] [phone]  Cardiology Nuclear Med Study  Willie Owens is a 62 y.o. male     MRN : [id number]     DOB: 1951-02-18  Procedure Date: 05/25/2013  Nuclear Med Background Indication for Stress Test:  Evaluation for Ischemia and Abnormal EKG History:  NO PRIOR HISTORY Cardiac Risk Factors: History of Smoking and AFIB  Symptoms:  Fatigue and SOB   Nuclear Pre-Procedure Caffeine/Decaff Intake:  7:00pm NPO After: 5:00am   IV Site: R Forearm  IV 0.9% NS with Angio Cath:  22g  Chest Size (in):  42"  IV Started by: Emmit Pomfret, RN  Height: 6\' 4"  (1.93 m)  Cup Size: n/a  BMI:  Body mass index is 22.77 kg/(m^2). Weight:  187 lb (84.823 kg)   Tech Comments:  N/A    Nuclear Med Study 1 or 2 day study: 1 day  Stress Test Type:  Lexiscan  Order Authorizing Provider:  Zoila Shutter, MD   Resting Radionuclide: Technetium 60m Sestamibi  Resting Radionuclide Dose: 10.7 mCi   Stress Radionuclide:  Technetium 52m Sestamibi  Stress Radionuclide Dose: 30.8 mCi           Stress Protocol Rest HR: 87 Stress HR: 107  Rest BP: 136/72 Stress BP: 112/77  Exercise Time (min): n/a METS: n/a          Dose of Adenosine (mg):  n/a Dose of Lexiscan: 0.4 mg  Dose of Atropine (mg): n/a Dose of Dobutamine: n/a mcg/kg/min (at max HR)  Stress Test Technologist: Ernestene Mention, CCT Nuclear Technologist: Gonzella Lex, CNMT   Rest Procedure:  Myocardial perfusion imaging was performed at rest 45 minutes following the intravenous administration of Technetium 67m Sestamibi. Stress Procedure:  The patient received IV Lexiscan 0.4 mg over 15-seconds.  Technetium 45m Sestamibi injected at 30-seconds.  There were no significant changes with Lexiscan.  Quantitative spect images were obtained after a 45 minute delay.  Transient Ischemic Dilatation (Normal <1.22):  1.07 Lung/Heart Ratio (Normal <0.45):   0.37 QGS EDV:  n/a ml QGS ESV:  n/a ml LV Ejection Fraction: Study not gated  Signed by      Rest ECG: Atrial Fibrillation at 87; Incomplete RRRB  Stress ECG: No significant change from baseline ECG  QPS Raw Data Images:  Normal; no motion artifact; normal heart/lung ratio. Stress Images:  Normal homogeneous uptake in all areas of the myocardium. Rest Images:  Normal homogeneous uptake in all areas of the myocardium. Subtraction (SDS):  No evidence of ischemia.  Impression Exercise Capacity:  Lexiscan with no exercise. BP Response:  Normal blood pressure response. Clinical Symptoms:  Mild shortness of breath ECG Impression:  No significant ST segment change suggestive of ischemia. Comparison with Prior Nuclear Study: No images to compare  Overall Impression:  Normal stress nuclear study.  LV Wall Motion:  Not gated due to AF.   Lennette Bihari, MD  05/25/2013 2:30 PM

## 2013-05-25 NOTE — Telephone Encounter (Signed)
Please call Julie-question about his medicine!

## 2013-05-28 MED ORDER — DILTIAZEM HCL ER COATED BEADS 120 MG PO CP24: 120.0000 mg | ORAL_CAPSULE | Freq: Every day | ORAL | Status: DC

## 2013-05-28 NOTE — Telephone Encounter (Signed)
Refill(s) sent to pharmacy.  Call to [facility] and left message that refill sent.

## 2013-05-28 NOTE — Telephone Encounter (Signed)
Returned call.  Willie Owens stated pt is out of diltiazem.  Wanted to know if pt is supposed to still take it.  Informed pt should continue taking all meds rx'd unless dc'd by doctor.  Pt uses [facility] [place].  Paper chart requested to review med list and last OV.

## 2013-06-01 ENCOUNTER — Encounter: Payer: Self-pay | Admitting: Internal Medicine

## 2013-06-01 ENCOUNTER — Ambulatory Visit (INDEPENDENT_AMBULATORY_CARE_PROVIDER_SITE_OTHER): Payer: BC Managed Care – PPO | Admitting: Internal Medicine

## 2013-06-01 VITALS — BP 126/80 | HR 90 | Ht 76.0 in | Wt 189.0 lb

## 2013-06-01 DIAGNOSIS — E041 Nontoxic single thyroid nodule: Secondary | ICD-10-CM

## 2013-06-01 DIAGNOSIS — E0590 Thyrotoxicosis, unspecified without thyrotoxic crisis or storm: Secondary | ICD-10-CM

## 2013-06-01 LAB — T3, FREE: T3, Free: 4.7 pg/mL — ABNORMAL HIGH (ref 2.3–4.2)

## 2013-06-01 NOTE — Patient Instructions (Signed)
Please return in 1 month. I will send you the results through MyChart.

## 2013-06-01 NOTE — Progress Notes (Addendum)
Patient ID: Willie Owens, male   DOB: 1951-06-20, 62 y.o.   MRN: [id number]   HPI  Willie Owens is a 62 y.o.-year-old male, self-referred for evaluation and treatment for hyperthyroidism. He is seeking a second opinion for a diagnosis of Graves' disease. He was previously seen by [facility] endocrinology. His ear with his wife, who offers part of the history.  Pt does not have a h/o thyroid disorders, but has been found to have a low TSH in 04/24/2013, when he presented to the emergency room for A. Fib with RVR, hematuria. Her TSH returned undetectable with high free T4 and free T3. A CBC showed normal white blood count. He was started on Toprol-XL 25 mg daily (stopped 05/12, and started Propranolol 80 daily and methimazole 20 mg 3 times a day at that time, which he continues today.  Lab Results  Component Value Date   TSH <0.008* 04/24/2013   FREET4 3.12* 04/24/2013  free T3 4.6. ESR 25 (0-16). Cortisol11.3 at 4:25 AM.  Labs were repeated on 04/30/2013, and a TSH was 0.009, free T4 3.44, total T3 10 (2-4.4). At that time, a TSI titer returned elevated, at 142% (< 139%), and a TPO antibody titer also returned elevated at 486 (less than 34).  A repeat set of labs on 05/22/2013, showed a TSH of 0.02, free T4 2.65 (0.61-1.12), free T3 8.1 (2-4.4). He had a stress test last week >> normal.  Pt denies: heat intolerance, anxiety, palpitations, insomnia, hyperdefecation, but has + tremors, + weight loss x 10 lbs, however in past year lost 35 lbs (intentional). He does have fatigue, cough and heartburn.  No dysphagia, odynophagia, lumps in neck or SOB with lying down.   He has had a CT of his chest the day before his thyroid tests on 04/24/2013. He denies using iodine disinfectants, increasing iodine in diet (kelp, seaweed) or taking OTC herbal or weight loss supplements. He is not on Amiodarone or other meds that can decrease TSH.   Pt does not have a FH of thyroid ds or autoimmune ds.   PMH: I  reviewed his chart and he also has a history of pulmonary nodules, alcohol withdrawal, history of tobacco abuse.  ROS: Constitutional: +  weight gain/loss, + fatigue, no subjective hyperthermia/hypothermia Eyes: no blurry vision, no xerophthalmia ENT: no sore throat, no nodules palpated in throat, no dysphagia/odynophagia, no hoarseness Cardiovascular: no CP/SOB/palpitations/leg swelling Respiratory: + cough/no SOB Gastrointestinal: no N/V/D/C, + gerd Musculoskeletal: no muscle/joint aches Skin: no rashes Neurological: no tremors/numbness/tingling/dizziness Psychiatric: no depression/anxiety  Past Surgical History  Procedure Laterality Date  . Leg surgery     History   Social History  . Marital Status: Married    Spouse Name: N/A    Number of Children: 3   Occupational History  . Self-employed-sales   Social History Main Topics  . Smoking status: Former Smoker -- 2.00 packs/day for 30 years    Types: Cigarettes    Quit date: 12/30/2004  . Smokeless tobacco: Never Used  . Alcohol Use: Yes     Comment: daily, vodka  . Drug Use: No  For exercise, he walks twice a week.  Current Outpatient Prescriptions on File Prior to Visit  Medication Sig Dispense Refill  . aspirin EC 81 MG tablet Take 81 mg by mouth 3 (three) times a week.      . B Complex-C (SUPER B COMPLEX PO) Take 1 tablet by mouth daily.      . cholecalciferol (VITAMIN D)  1000 UNITS tablet Take 1,000 Units by mouth daily.      Marland Kitchen diltiazem (CARDIZEM CD) 120 MG 24 hr capsule Take 1 capsule (120 mg total) by mouth daily.  30 capsule  5  . diphenhydrAMINE (BENADRYL) 25 MG tablet Take 25 mg by mouth every evening.      . Ibuprofen-Diphenhydramine Cit (ADVIL PM PO) Take 2 tablets by mouth as needed.       . Melatonin 5 MG TABS Take 1 tablet by mouth every evening.      . methimazole (TAPAZOLE) 10 MG tablet Take 6 tablets (60 mg total) by mouth daily.  30 tablet  0  . metoprolol succinate (TOPROL-XL) 25 MG 24 hr tablet  Take 1 tablet (25 mg total) by mouth daily.  30 tablet  0  . Multiple Vitamin (MULTIVITAMIN WITH MINERALS) TABS Take 1 tablet by mouth daily.       No current facility-administered medications on file prior to visit.    Allergies  Allergen Reactions  . Penicillins Other (See Comments)    Hallucinations, from childhood    Family History  Problem Relation Age of Onset  . Pulmonary embolism Neg Hx   . CAD Neg Hx    PE: BP 126/80  Pulse 90  Ht 6\' 4"  (1.93 m)  Wt 189 lb (85.73 kg)  BMI 23.02 kg/m2  SpO2 98% Wt Readings from Last 3 Encounters:  06/01/13 189 lb (85.73 kg)  05/25/13 187 lb (84.823 kg)  05/04/13 187 lb 9.6 oz (85.095 kg)   Constitutional: thin, in NAD  Eyes: PERRLA, EOMI, no exophthalmos, no lid lag, no stare  ENT: moist mucous membranes, slight thyromegaly, L > R, no thyroid bruits; no cervical lymphadenopathy  Cardiovascular: RRR, No MRG  Respiratory: CTA B  Gastrointestinal: abdomen soft, NT, ND, BS+  Musculoskeletal: no deformities, strength intact in all 4  Skin: moist, warm, no rashes  Neurological: + tremor with outstretched hands, DTR normal in all 4   ASSESSMENT:  1. Thyrotoxicosis   PLAN:  1. Pt with recently discovered thyrotoxicosis.  - We discussed about the fact that his condition can be caused by exogenous factors (meds, OTC supplements, iodine use) - but none apparent for him except IV contrast the day before the first set of thyroid labs, but this was 1.5 mo ago, and labs persistently abnormal - alow TSH can be caused by lab error, however this is unlikely since concordant TSH, fT4, and symptoms - will recheck labs  - Thyroid reasons for a low TSH can be:  Graves' disease  Toxic uni- or multinodular goiter  Thyroiditis - unlikely based on history of weight loss - To differentiate between these, we will need to a thyroid uptake and scan. They agree with this and I have ordered it today.  - depending on the results, the next step is a thyroid  U/S and, if thyroid nodules on U/S, the next step is nodule Bx (FNA)  - Only after FNA is performed, we can proceed with RAI ablation, since RAI ablation can influence the Bx results. With his history of A. Fib with RVR, I would recommend definitive treatment with radioactive iodine ablation rather than methimazole - For now, we'll continue with the methimazole although I advised him to hold methimazole for 2 days prior to his uptake and scan, which is scheduled in 3 days, on Monday, June 9 at 1 PM at [facility]. After he has his uptake and scan, I believe we can use a  lower dose of methimazole (probably 10 mg 3 times a day, depending on his test results) - advised to RTC in 1 mo   Office Visit on 06/01/2013  Component Date Value Range Status  . TSH 06/01/2013 0.05* 0.35 - 5.50 uIU/mL Final  . Free T4 06/01/2013 1.91* 0.60 - 1.60 ng/dL Final  . T3, Free 78/29/5621 4.7* 2.3 - 4.2 pg/mL Final   Free t4 improved. TSH still low, but this lags behind fT4 during tx. We will switch to 10 mg tid Methimazole. Pt informed.  Thyroid U&S: *RADIOLOGY REPORT*  Clinical Data: Hyperthyroidism  THYROID SCAN AND UPTAKE - 24 HOURS  Technique: Following the per oral administration of I-131 sodium iodide, the patient returned at 24 hours and uptake measurements were acquired with the uptake probe centered on the neck. Thyroid imaging was performed following the intravenous administration of the Tc-86m Pertechnetate.  Radiopharmaceuticals: 8 uCi I-131 sodium iodide and 10 mCi TC-27m pertechnetate  Comparison: None  Findings: 24-hour radioiodine uptake is calculated at 53%, well above the normal range. Images of the thyroid gland in three projections demonstrate diffusely increased tracer localization throughout both thyroid lobes. Subtle cold nodule questioned at inferior pole left lobe. No focal areas of increased tracer localization seen.  IMPRESSION: Elevated 24-hour radioiodine uptake of  53%.Question subtle cold nodule at inferior pole left lobe. Thyroid sonography recommended to assess for potential thyroid nodule.  Original Report Authenticated By: Ulyses Southward, M.D.  Likely Graves ds. Because of possible cold nodule >> will order Thyroid Ultrasound.

## 2013-06-04 ENCOUNTER — Encounter (HOSPITAL_COMMUNITY): Admission: RE | Admit: 2013-06-04 | Payer: BC Managed Care – PPO | Source: Ambulatory Visit

## 2013-06-05 ENCOUNTER — Encounter (HOSPITAL_COMMUNITY): Payer: BC Managed Care – PPO

## 2013-06-07 ENCOUNTER — Encounter (HOSPITAL_COMMUNITY)
Admission: RE | Admit: 2013-06-07 | Discharge: 2013-06-07 | Disposition: A | Discharge status: Home / Self Care | Payer: BC Managed Care – PPO | Source: Ambulatory Visit | Attending: Internal Medicine | Admitting: Internal Medicine

## 2013-06-07 ENCOUNTER — Other Ambulatory Visit (HOSPITAL_COMMUNITY): Payer: BC Managed Care – PPO

## 2013-06-07 ENCOUNTER — Encounter (HOSPITAL_COMMUNITY): Payer: Self-pay

## 2013-06-07 DIAGNOSIS — E0590 Thyrotoxicosis, unspecified without thyrotoxic crisis or storm: Secondary | ICD-10-CM | POA: Insufficient documentation

## 2013-06-07 MED ORDER — SODIUM IODIDE I 131 CAPSULE
8.0000 | Freq: Once | INTRAVENOUS | Status: AC | PRN
  Administered 2013-06-07: 8 via ORAL

## 2013-06-08 ENCOUNTER — Encounter (HOSPITAL_COMMUNITY)
Admission: RE | Admit: 2013-06-08 | Discharge: 2013-06-08 | Disposition: A | Discharge status: Home / Self Care | Payer: BC Managed Care – PPO | Source: Ambulatory Visit | Attending: Internal Medicine | Admitting: Internal Medicine

## 2013-06-08 MED ORDER — SODIUM PERTECHNETATE TC 99M INJECTION
10.0000 | Freq: Once | INTRAVENOUS | Status: AC | PRN
  Administered 2013-06-08: 10 via INTRAVENOUS

## 2013-06-08 NOTE — Addendum Note (Signed)
Addended by: Carlus Pavlov on: 06/08/2013 12:17 PM   Modules accepted: Orders

## 2013-06-10 ENCOUNTER — Encounter: Payer: Self-pay | Admitting: Internal Medicine

## 2013-06-11 NOTE — Addendum Note (Signed)
Addended by: Carlus Pavlov on: 06/11/2013 08:13 AM   Modules accepted: Orders

## 2013-06-12 ENCOUNTER — Encounter: Payer: Self-pay | Admitting: Internal Medicine

## 2013-06-15 ENCOUNTER — Ambulatory Visit (INDEPENDENT_AMBULATORY_CARE_PROVIDER_SITE_OTHER)
Admission: RE | Admit: 2013-06-15 | Discharge: 2013-06-15 | Disposition: A | Discharge status: Home / Self Care | Payer: BC Managed Care – PPO | Source: Ambulatory Visit | Attending: Emergency Medicine | Admitting: Emergency Medicine

## 2013-06-15 ENCOUNTER — Encounter: Payer: Self-pay | Admitting: Internal Medicine

## 2013-06-15 ENCOUNTER — Ambulatory Visit (INDEPENDENT_AMBULATORY_CARE_PROVIDER_SITE_OTHER): Payer: BC Managed Care – PPO | Admitting: Internal Medicine

## 2013-06-15 VITALS — BP 100/64 | HR 68 | Ht 77.0 in | Wt 194.3 lb

## 2013-06-15 DIAGNOSIS — I4891 Unspecified atrial fibrillation: Secondary | ICD-10-CM

## 2013-06-15 DIAGNOSIS — E0590 Thyrotoxicosis, unspecified without thyrotoxic crisis or storm: Secondary | ICD-10-CM

## 2013-06-15 DIAGNOSIS — R918 Other nonspecific abnormal finding of lung field: Secondary | ICD-10-CM

## 2013-06-15 MED ORDER — IOHEXOL 300 MG/ML  SOLN
80.0000 mL | Freq: Once | INTRAMUSCULAR | Status: AC | PRN
  Administered 2013-06-15: 80 mL via INTRAVENOUS

## 2013-06-15 NOTE — Progress Notes (Signed)
OFFICE NOTE  Chief Complaint:  Return office visit  Primary Care Physician: Eartha Inch, MD  HPI:  Willie Owens is a 62 year old gentleman who was recently in the hospital for a pneumonia. He also had atrial fibrillation with rapid ventricular response, abnormal autoimmune testing with a positive ANA, as well as new onset hyperthyroidism and thyrotoxicosis. While hospitalized, he was treated for A-fib with rate control; however, due to a low CHADS2 score was not anticoagulated. He had an echocardiogram which showed severe biatrial enlargement thought maybe related to pulmonary or thyroid disease, A-fib or perhaps structural and unrelated. There was a question of alcohol use and how that would play a role in his symptoms as he was having tremors, but he reports that he has been able to stop his drinking and has had no symptoms of any withdrawal. This drinking consisted of about 1 vodka per day. At his last visit, I recommended starting propranolol XR for rate control. He is tolerating that well. He was also started on Xarelto which she is taking and has not had bleeding complications.  Unfortunately, his hyperthyroidism is still not controlled.  This is in part due to the fact that he changed endocrinologists. He does, however feel better.  PMHx:  Past Medical History  Diagnosis Date  . Medical history non-contributory     Past Surgical History  Procedure Laterality Date  . Leg surgery      FAMHx:  Family History  Problem Relation Age of Onset  . Pulmonary embolism Neg Hx   . CAD Neg Hx     SOCHx:   reports that he quit smoking about 8 years ago. His smoking use included Cigarettes. He has a 60 pack-year smoking history. He has never used smokeless tobacco. He reports that  drinks alcohol. He reports that he does not use illicit drugs.  ALLERGIES:  Allergies  Allergen Reactions  . Penicillins Other (See Comments)    Hallucinations, from childhood    ROS: A  comprehensive review of systems was negative except for: Cardiovascular: positive for palpitations  HOME MEDS: Current Outpatient Prescriptions  Medication Sig Dispense Refill  . B Complex-C (SUPER B COMPLEX PO) Take 1 tablet by mouth daily.      . cholecalciferol (VITAMIN D) 1000 UNITS tablet Take 1,000 Units by mouth daily.      Marland Kitchen diltiazem (CARDIZEM CD) 120 MG 24 hr capsule Take 1 capsule (120 mg total) by mouth daily.  30 capsule  5  . diphenhydrAMINE (BENADRYL) 25 MG tablet Take 25 mg by mouth every evening.      . Ibuprofen-Diphenhydramine Cit (ADVIL PM PO) Take 2 tablets by mouth as needed.       . Melatonin 5 MG TABS Take 1 tablet by mouth every evening.      . methimazole (TAPAZOLE) 10 MG tablet Take 10 mg by mouth 3 (three) times daily.      . metoprolol succinate (TOPROL-XL) 25 MG 24 hr tablet Take 1 tablet (25 mg total) by mouth daily.  30 tablet  0  . Multiple Vitamin (MULTIVITAMIN WITH MINERALS) TABS Take 1 tablet by mouth daily.      . propranolol ER (INDERAL LA) 80 MG 24 hr capsule Take 80 mg by mouth daily.      . Rivaroxaban (XARELTO) 20 MG TABS 20 mg daily. 20 mg daily.       No current facility-administered medications for this visit.    LABS/IMAGING: No results found for this or any previous  visit (from the past 48 hour(s)). Ct Chest W Contrast  06/15/2013   *RADIOLOGY REPORT*  Clinical Data: Follow up for diffuse ill-defined lung nodularity seen on the chest CT, 04/23/2013  CT CHEST WITH CONTRAST  Technique:  Multidetector CT imaging of the chest was performed following the standard protocol during bolus administration of intravenous contrast.  Contrast: 80mL OMNIPAQUE IOHEXOL 300 MG/ML  SOLN  Comparison: 04/23/2013  Findings: Since prior study, the bilateral patchy ground-glass and more dense areas of lung consolidation have significantly improved. There are some small residual hazy areas of ground-glass opacity and coarse reticular nodular opacities most evident in the  left upper lower lobes.  There are no new areas of lung opacity.  The heart is normal in size.  There are moderate coronary artery calcifications.  No mediastinal or hilar masses are appreciated. Sub centimeter mediastinal and hilar lymph nodes are noted. Adenopathy has improved from prior exam.  IMPRESSION: New there has been significant improvement since the prior study. The previously described areas of lung infiltrate have mostly resolved.  There are small areas of ill-defined ground-glass and reticular nodular opacity which persist, which may reflect postinflammatory atelectasis/scarring.  No new lung abnormalities. There has also been improvement in mediastinal and hilar adenopathy, now with no pathologically enlarged lymph nodes.  Recommend an additional follow-up chest CT in 6 months to document either stability of these findings or further resolution.   Original Report Authenticated By: Amie Portland, M.D.    VITALS: BP 100/64  Pulse 68  Ht 6\' 5"  (1.956 m)  Wt 194 lb 4.8 oz (88.134 kg)  BMI 23.04 kg/m2  EXAM: General appearance: alert and no distress Neck: no adenopathy, no carotid bruit, no JVD, supple, symmetrical, trachea midline and thyroid not enlarged, symmetric, no tenderness/mass/nodules Lungs: clear to auscultation bilaterally Heart: regular rate and rhythm, S1, S2 normal, no murmur, click, rub or gallop Abdomen: soft, non-tender; bowel sounds normal; no masses,  no organomegaly Extremities: extremities normal, atraumatic, no cyanosis or edema Pulses: 2+ and symmetric Skin: Skin color, texture, turgor normal. No rashes or lesions Neurologic: Grossly normal  EKG: Atrial fibrillation with controlled ventricular response at 68  ASSESSMENT: 1. Atrial fibrillation with controlled ventricular response 2. Hyperthyroidism 3. History of alcohol abuse 4. Recent negative nuclear stress test on 05/25/2013  PLAN: 1.   Willie Owens has rate controlled atrial fibrillation. His stress  test was negative for ischemia. I suspect this is due to biatrial enlargement, alcoholism, and hyperthyroidism. Unfortunately his hyperthyroidism is not yet fully treated. Hopefully over the next month or 2 this will be at goal. Would like that to be an equilibrium before considering cardioversion. Given his biatrial enlargement without a clear source, would recommend a TEE guided cardioversion. I discussed the risks and benefits of this procedure in the office today he is agreeable. We'll continue him on Xarelto and propranolol, and plan to see him back in 3 months.  Chrystie Nose, MD, [facility] Attending Cardiologist [facility]  Willie Owens C 06/15/2013, 1:53 PM

## 2013-06-15 NOTE — Patient Instructions (Signed)
Return in 3 months - we will consider TEE/Cardioversion at that time.

## 2013-06-18 ENCOUNTER — Other Ambulatory Visit: Payer: BC Managed Care – PPO

## 2013-06-20 ENCOUNTER — Encounter: Payer: Self-pay | Admitting: Emergency Medicine

## 2013-06-20 ENCOUNTER — Ambulatory Visit (INDEPENDENT_AMBULATORY_CARE_PROVIDER_SITE_OTHER): Payer: BC Managed Care – PPO | Admitting: Emergency Medicine

## 2013-06-20 VITALS — BP 102/60 | HR 87 | Temp 97.6°F | Ht 75.5 in | Wt 199.0 lb

## 2013-06-20 DIAGNOSIS — J4489 Other specified chronic obstructive pulmonary disease: Secondary | ICD-10-CM

## 2013-06-20 DIAGNOSIS — Z87891 Personal history of nicotine dependence: Secondary | ICD-10-CM

## 2013-06-20 DIAGNOSIS — R918 Other nonspecific abnormal finding of lung field: Secondary | ICD-10-CM

## 2013-06-20 DIAGNOSIS — J449 Chronic obstructive pulmonary disease, unspecified: Secondary | ICD-10-CM

## 2013-06-20 DIAGNOSIS — F17201 Nicotine dependence, unspecified, in remission: Secondary | ICD-10-CM

## 2013-06-20 LAB — PULMONARY FUNCTION TEST

## 2013-06-20 NOTE — Progress Notes (Signed)
HPI:  62 yo man, hx EtOH abuse, former tobacco (60 pk-yrs), was admitted recently w hematuria, dyspnea and a productive cough. Newly dx with A fib + RVR on presentation. His eval revealed (new) hyperthyroidism (started on tapazole).  A CT scan of his chest was performed 4/28, showed no PE but multifocal L > R ill-defined nodular infiltrates. He was treated for suspected CAP and PCCM was consulted to consider other etiologies. An auto-immune and exposure w/u was largely negative w exception of weak ANA and atypical p-ANCA (both non-specific). He presents today for f/u of the hospitalization.  He finished course today. Reports that his cough and SOB are unchanged. He has seen [facility], plans to see Dr Rennis Golden w [facility]  Labs in house:  Quant gold >> negative HIV >> negative HSP fungal Ab's >> all negative Autoimmune labs including SSA (Ro), SSB (La), a-Smith, a-Jo-1, a-centromere, a-CCP, a-RNA, a-dsDNA were ALL NEGATIVE Complement >> normal RF >> normal ANA >> positive speckled at 1:40 ANCA >> positive in atypical pattern at 1:20  ROV 06/20/13 --  62 yo man, hx tobacco, EtOH, recent admission for B infiltrates suspected to be infectious but also concerning for possible inflammatory process. Returns today post-PFT and repeat CT scan. As below, his CT has almost fully resolved consistent with an infectious process. PFT show moderately severe to severe obstruction.    PULMONARY FUNCTON TEST 06/20/2013  FVC 3.68  FEV1 2.29  FEV1/FVC 62.2  FVC  % Predicted 64  FEV % Predicted 52  FeF 25-75 3.51  FeF 25-75 % Predicted 5.47       Filed Vitals:   06/20/13 1551  BP: 102/60  Pulse: 87  Temp: 97.6 F (36.4 C)  TempSrc: Oral  Height: 6' 3.5" (1.918 m)  Weight: 199 lb (90.266 kg)  SpO2: 98%   Gen: Pleasant, well-nourished, in no distress,  normal affect  ENT: No lesions,  mouth clear,  oropharynx clear, no postnasal drip  Neck: No JVD, no TMG, no carotid bruits  Lungs: No use of  accessory muscles, no dullness to percussion, clear without rales or rhonchi  Cardiovascular: RRR, heart sounds normal, no murmur or gallops, no peripheral edema  Musculoskeletal: No deformities, no cyanosis or clubbing  Neuro: alert, non focal  Skin: Warm, no lesions or rashes   CT scan 06/15/13 --  Comparison: 04/23/2013  Findings: Since prior study, the bilateral patchy ground-glass and  more dense areas of lung consolidation have significantly improved.  There are some small residual hazy areas of ground-glass opacity  and coarse reticular nodular opacities most evident in the left  upper lower lobes. There are no new areas of lung opacity.  The heart is normal in size. There are moderate coronary artery  calcifications. No mediastinal or hilar masses are appreciated.  Sub centimeter mediastinal and hilar lymph nodes are noted.  Adenopathy has improved from prior exam.  IMPRESSION:  New there has been significant improvement since the prior study.  The previously described areas of lung infiltrate have mostly  resolved. There are small areas of ill-defined ground-glass and  reticular nodular opacity which persist, which may reflect  postinflammatory atelectasis/scarring. No new lung abnormalities.  There has also been improvement in mediastinal and hilar  adenopathy, now with no pathologically enlarged lymph nodes.  Recommend an additional follow-up chest CT in 6 months to document  either stability of these findings or further resolution.   Pulmonary nodules Resolved on Ct scan 06/15/13, most consistent with an infectious process (  he was not treated with steroids).  - follow in 6 months with a CXR  COPD (chronic obstructive pulmonary disease) Severe obstruction on PFT but he is asymptomatic. At this time he would like to defer scheduled BD's, especially in light of his recent A fib. It is possible that he may want to try a BD once his hyperthyroid is treated, A fib is  better, or if he develops new breathing sx.  - teach him albuterol prn for now

## 2013-06-20 NOTE — Patient Instructions (Addendum)
We will not start any scheduled inhaled medication at this time since yo are not having breathing symptoms. We may decide to do this in the future depending on how you are feeling.  Keep ProAir available to use 2 puffs if needed for shortness of breath Follow with Dr Delton Coombes in 6 months with a CXR, or sooner if you have any problems

## 2013-06-20 NOTE — Assessment & Plan Note (Signed)
Severe obstruction on PFT but he is asymptomatic. At this time he would like to defer scheduled BD's, especially in light of his recent A fib. It is possible that he may want to try a BD once his hyperthyroid is treated, A fib is better, or if he develops new breathing sx.  - teach him albuterol prn for now

## 2013-06-20 NOTE — Assessment & Plan Note (Signed)
Resolved on Ct scan 06/15/13, most consistent with an infectious process (he was not treated with steroids).  - follow in 6 months with a CXR

## 2013-06-20 NOTE — Progress Notes (Signed)
PFT done today. 

## 2013-06-22 ENCOUNTER — Ambulatory Visit
Admission: RE | Admit: 2013-06-22 | Discharge: 2013-06-22 | Disposition: A | Discharge status: Home / Self Care | Payer: BC Managed Care – PPO | Source: Ambulatory Visit | Attending: Internal Medicine | Admitting: Internal Medicine

## 2013-06-22 DIAGNOSIS — E041 Nontoxic single thyroid nodule: Secondary | ICD-10-CM

## 2013-06-24 ENCOUNTER — Encounter: Payer: Self-pay | Admitting: Internal Medicine

## 2013-06-24 ENCOUNTER — Other Ambulatory Visit: Payer: Self-pay | Admitting: Internal Medicine

## 2013-06-24 DIAGNOSIS — E0520 Thyrotoxicosis with toxic multinodular goiter without thyrotoxic crisis or storm: Secondary | ICD-10-CM

## 2013-06-26 ENCOUNTER — Telehealth: Payer: Self-pay | Admitting: Internal Medicine

## 2013-06-26 NOTE — Telephone Encounter (Signed)
[name] is needing the ok to hold [name] for 24 hrs for a thyroid biopsy.Janan Ridge Thanks

## 2013-06-26 NOTE — Telephone Encounter (Signed)
Message forwarded to Dr. Hilty.  

## 2013-06-27 NOTE — Telephone Encounter (Signed)
Ok to hold xarelto for thyroid biopsy.  Dr. Rennis Golden

## 2013-06-28 NOTE — Telephone Encounter (Signed)
Message forwarded to Jenna, RN.  

## 2013-06-28 NOTE — Telephone Encounter (Signed)
Faxed clearance to hold medication to [facility] on 06/28/13.

## 2013-06-28 NOTE — Telephone Encounter (Signed)
Still have not received anything from you-concerning whether he can stop his Xarelto! Need to know asap! Waiting to schedule procedure!

## 2013-07-06 ENCOUNTER — Other Ambulatory Visit: Payer: Self-pay | Admitting: Internal Medicine

## 2013-07-06 ENCOUNTER — Other Ambulatory Visit: Payer: Self-pay | Admitting: *Deleted

## 2013-07-06 ENCOUNTER — Ambulatory Visit: Payer: BC Managed Care – PPO | Admitting: Internal Medicine

## 2013-07-06 ENCOUNTER — Other Ambulatory Visit (INDEPENDENT_AMBULATORY_CARE_PROVIDER_SITE_OTHER): Payer: BC Managed Care – PPO

## 2013-07-06 ENCOUNTER — Ambulatory Visit (HOSPITAL_COMMUNITY)
Admission: RE | Admit: 2013-07-06 | Discharge: 2013-07-06 | Disposition: A | Discharge status: Home / Self Care | Payer: BC Managed Care – PPO | Source: Ambulatory Visit | Attending: Internal Medicine | Admitting: Internal Medicine

## 2013-07-06 DIAGNOSIS — E0520 Thyrotoxicosis with toxic multinodular goiter without thyrotoxic crisis or storm: Secondary | ICD-10-CM

## 2013-07-06 DIAGNOSIS — E0590 Thyrotoxicosis, unspecified without thyrotoxic crisis or storm: Secondary | ICD-10-CM

## 2013-07-06 DIAGNOSIS — E042 Nontoxic multinodular goiter: Secondary | ICD-10-CM | POA: Insufficient documentation

## 2013-07-06 LAB — T4, FREE: Free T4: 0.92 ng/dL (ref 0.60–1.60)

## 2013-07-06 LAB — TSH: TSH: 0.03 u[IU]/mL — ABNORMAL LOW (ref 0.35–5.50)

## 2013-07-09 ENCOUNTER — Other Ambulatory Visit: Payer: BC Managed Care – PPO

## 2013-07-09 ENCOUNTER — Other Ambulatory Visit: Payer: Self-pay | Admitting: Internal Medicine

## 2013-07-09 DIAGNOSIS — E0500 Thyrotoxicosis with diffuse goiter without thyrotoxic crisis or storm: Secondary | ICD-10-CM

## 2013-07-09 MED ORDER — METHIMAZOLE 10 MG PO TABS: 10.0000 mg | ORAL_TABLET | Freq: Every day | ORAL | Status: DC

## 2013-07-13 ENCOUNTER — Ambulatory Visit (INDEPENDENT_AMBULATORY_CARE_PROVIDER_SITE_OTHER): Payer: BC Managed Care – PPO | Admitting: Internal Medicine

## 2013-07-13 ENCOUNTER — Encounter: Payer: Self-pay | Admitting: Internal Medicine

## 2013-07-13 VITALS — BP 104/60 | HR 79 | Temp 97.4°F | Resp 12 | Wt 207.0 lb

## 2013-07-13 DIAGNOSIS — E0590 Thyrotoxicosis, unspecified without thyrotoxic crisis or storm: Secondary | ICD-10-CM

## 2013-07-13 NOTE — Progress Notes (Signed)
Patient ID: Gwenevere Abbot, male   DOB: 07/12/1951, 62 y.o.   MRN: [id number]   HPI  Lazlo B Wilsey is a 62 y.o.-year-old male, returning for f/u for hyperthyroidism. He was previously seen by [facility] endocrinology. He is here with his wife, who offers part of the history.  Pt was found to have a low TSH in 04/24/2013, when he presented to the ED for A. Fib with RVR, hematuria. His TSH returned undetectable with high free T4 and free T3:  Component     Latest Ref Rng 04/24/2013  TSH     0.35 - 5.50 uIU/mL <0.008 (L)  Free T4     0.60 - 1.60 ng/dL 7.82 (H)  T3, Free     2.3 - 4.2 pg/mL 4.6 (H)   A CBC showed normal white blood count. ESR 25 (0-16). Cortisol 11.3 at 4:25 AM. He was started on Toprol-XL 25 mg daily - subsequently stopped 05/12, and started Propranolol 80 daily and methimazole 20 mg 3 times a day at that time.   Labs were repeated on 04/30/2013, and a TSH was 0.009, free T4 3.44, total T3 10 (2-4.4). At that time, a TSI titer returned elevated, at 142% (< 139%), and a TPO antibody titer also returned elevated at 486 (less than 34).  A repeat set of labs on 05/22/2013, showed a TSH of 0.02, free T4 2.65 (0.61-1.12), free T3 8.1 (2-4.4). He had a stress test >> normal.  At last visit, labs showed: Component     Latest Ref Rng 06/01/2013  TSH     0.35 - 5.50 uIU/mL 0.05 (L)  Free T4     0.60 - 1.60 ng/dL 9.56 (H)  T3, Free     2.3 - 4.2 pg/mL 4.7 (H)  Since TFTs improved (see above), I advised him to decrease the MMI dose to 10 mg TID.  One month ago, I referred the patient for an uptake and scan that showed increased uptake a 52%, uniformly increased throughout the thyroid, except for a subtle cold defect in the inferior pole of the left lobe. An ultrasound showed that this corresponds to a 1.2 cm nodule. I referred the patient for an FNA of the nodule, however the nodule was not substantiated on the prebiopsy ultrasound exam, so the biopsy was canceled.  A week ago,   labs were: Component     Latest Ref Rng 07/06/2013  TSH     0.35 - 5.50 uIU/mL 0.03 (L)  Free T4     0.60 - 1.60 ng/dL 2.13  T3, Free     2.3 - 4.2 pg/mL 2.8  I advised him to decrease MMI to 10 mg daily, but he did not do this yet.  Since last visit, he saw Dr. Delton Coombes - reviewed his note: PFT show moderately severe to severe obstruction.  PULMONARY FUNCTON TEST  06/20/2013   FVC  3.68   FEV1  2.29   FEV1/FVC  62.2   FVC % Predicted  64   FEV % Predicted  52   FeF 25-75  3.51   FeF 25-75 % Predicted  5.47   A CT chest showed improved appearance of previously seen lung nodules.  Pt denies: heat intolerance, anxiety, palpitations, insomnia, hyperdefecation, but has + tremors, and in last month he had weight gain - 7 lbs. No dysphagia, odynophagia, lumps in neck or SOB with lying down.   PMH: He also has a history of pulmonary nodules, alcohol withdrawal, history of tobacco  abuse.  ROS: Constitutional: +  weight gain/loss, no fatigue, no subjective hyperthermia/hypothermia Eyes: no blurry vision, no xerophthalmia ENT: no sore throat, no nodules palpated in throat, no dysphagia/odynophagia, no hoarseness Cardiovascular: no CP/SOB/palpitations/leg swelling Respiratory: no cough/no SOB Gastrointestinal: no N/V/D/C, + gerd Musculoskeletal: no muscle/joint aches Skin: no rashes Neurological: + mild tremors/no numbness/tingling/dizziness  PE: BP 104/60  Pulse 79  Temp(Src) 97.4 F (36.3 C) (Oral)  Resp 12  Wt 207 lb (93.895 kg)  BMI 25.52 kg/m2  SpO2 97% Wt Readings from Last 3 Encounters:  07/13/13 207 lb (93.895 kg)  06/20/13 199 lb (90.266 kg)  06/15/13 194 lb 4.8 oz (88.134 kg)   Constitutional: thin, in NAD  Eyes: PERRLA, EOMI, no exophthalmos, no lid lag, no stare  ENT: moist mucous membranes, slight thyromegaly, L > R, no thyroid bruits; no cervical lymphadenopathy  Cardiovascular: irreg irreg, RR, No MRG  Respiratory: CTA B  Gastrointestinal: abdomen soft, NT,  ND, BS+  Musculoskeletal: no deformities, strength intact in all 4  Skin: moist, warm, no rashes  Neurological: + tremor with outstretched hands, DTR normal in all 4   ASSESSMENT:  1. Graves Ds.  - Thyroid Uptake and scan (06/08/2013): 24-hour radioiodine uptake 53%. Diffusely increased tracer localization throughout both thyroid lobes. Subtle cold nodule questioned at inferior pole left lobe. No focal areas of increased tracer localization seen.  - Thyroid ultrasound (06/22/2013):  The echogenicity of the thyroid parenchyma is diffusely inhomogeneous.   Right thyroid lobe: 4.9 x 2.7 x 2.5 cm.   Left thyroid lobe: 5.7 x 2.1 x 2.6 cm.   Isthmus: 8.2 mm in thickness.   Focal nodules: Several poorly defined solid nodules are noted bilaterally.  - At the site of the "cold defect" questioned on thyroid nuclear medicine scan in the inferior left lobe of thyroid, there is a poorly defined solid nodule present of 1.2 x 0.9 x 1.2 cm.  - A solid nodule in the left mid lobe measures 1.1 x 0.9 x 1.0 cm.  - A 6 mm solid nodule is noted in the upper pole of the left lobe.  - mid upper right lobe 1.3 x 0.7 x 1.1 cm - solid nodule in the lower pole of the right lobe of 1.1 x 0.7 x 0.9 cm.   Lymphadenopathy: Lymph nodes are present, none of which appear enlarged.   - repeat thyroid ultrasound (07/06/2013) - during appointment for FNA: diffuse heterogeneous architecture of the left thyroid  gland with scattered sub centimeter nodularity.  - The previously described subtle 12 mm inferior pole nodule is not reproducible on today's exam. Therefore biopsy was not performed. This appears to represent a pseudo lesion.   PLAN:  1. Pt with recently dx Graves ds - dx was made on the basis of increased uniform thyroid tracer uptake and the presence of TSI antibodies - the patient's thyroid tests greatly improved on methimazole, and I think we can decrease the dose to 10 mg daily for now, provided the patient  returns in 3 weeks from now to have repeat labs. - he is asking me whether he might be able to have his cardioversion. I explained that his thyroid hormones (free T4 and free T3) are in the normal range, while the TSH is low but this is usually lagging behind during resolution of hyperthyroidism. In other words, he is euthyroid now and can undergo cardioversion. - With the new information about his pulmonary function tests showing moderate to severe obstruction, I believe that  the best way to manage his Luiz Blare' disease at least for now is probably to stay on methimazole and wait for the inflammation to resolve. I discussed with him and his wife that the chances for Graves' disease to resolve by itself while he is on methimazole is probably around 20%.  - we again discussed about possible side effects of methimazole, and conditions in which she would need to call us  - since his thyroid nodule was not observed on the last thyroid ultrasound, I think we can manage this expectantly and my need to repeat the ultrasound in 1-2 years from now - patient and his wife agree with plan - advised to RTC in 6 mo, goal would stay in touch regarding his thyroid tests

## 2013-07-13 NOTE — Patient Instructions (Signed)
Please return in 3 weeks for labs.

## 2013-08-01 ENCOUNTER — Encounter: Payer: Self-pay | Admitting: *Deleted

## 2013-08-03 ENCOUNTER — Ambulatory Visit: Payer: BC Managed Care – PPO | Admitting: Internal Medicine

## 2013-08-07 ENCOUNTER — Other Ambulatory Visit (INDEPENDENT_AMBULATORY_CARE_PROVIDER_SITE_OTHER): Payer: BC Managed Care – PPO

## 2013-08-07 DIAGNOSIS — E0500 Thyrotoxicosis with diffuse goiter without thyrotoxic crisis or storm: Secondary | ICD-10-CM

## 2013-08-08 ENCOUNTER — Other Ambulatory Visit: Payer: Self-pay | Admitting: Internal Medicine

## 2013-08-08 DIAGNOSIS — E0590 Thyrotoxicosis, unspecified without thyrotoxic crisis or storm: Secondary | ICD-10-CM

## 2013-08-08 MED ORDER — METHIMAZOLE 10 MG PO TABS: 5.0000 mg | ORAL_TABLET | Freq: Every day | ORAL | Status: DC

## 2013-08-14 ENCOUNTER — Encounter: Payer: Self-pay | Admitting: Internal Medicine

## 2013-08-30 ENCOUNTER — Other Ambulatory Visit (INDEPENDENT_AMBULATORY_CARE_PROVIDER_SITE_OTHER): Payer: BC Managed Care – PPO

## 2013-08-30 DIAGNOSIS — E0590 Thyrotoxicosis, unspecified without thyrotoxic crisis or storm: Secondary | ICD-10-CM

## 2013-08-30 LAB — TSH: TSH: 0.21 u[IU]/mL — ABNORMAL LOW (ref 0.35–5.50)

## 2013-08-30 LAB — T4, FREE: Free T4: 0.65 ng/dL (ref 0.60–1.60)

## 2013-08-31 ENCOUNTER — Other Ambulatory Visit: Payer: Self-pay | Admitting: Internal Medicine

## 2013-08-31 DIAGNOSIS — E0590 Thyrotoxicosis, unspecified without thyrotoxic crisis or storm: Secondary | ICD-10-CM

## 2013-08-31 MED ORDER — METHIMAZOLE 10 MG PO TABS: 5.0000 mg | ORAL_TABLET | ORAL | Status: DC

## 2013-09-14 ENCOUNTER — Encounter: Payer: Self-pay | Admitting: Internal Medicine

## 2013-09-14 ENCOUNTER — Ambulatory Visit (INDEPENDENT_AMBULATORY_CARE_PROVIDER_SITE_OTHER): Payer: BC Managed Care – PPO | Admitting: Internal Medicine

## 2013-09-14 VITALS — BP 128/72 | HR 83 | Ht 77.0 in | Wt 218.9 lb

## 2013-09-14 DIAGNOSIS — R5381 Other malaise: Secondary | ICD-10-CM

## 2013-09-14 DIAGNOSIS — D689 Coagulation defect, unspecified: Secondary | ICD-10-CM

## 2013-09-14 DIAGNOSIS — I4891 Unspecified atrial fibrillation: Secondary | ICD-10-CM

## 2013-09-14 DIAGNOSIS — Z01818 Encounter for other preprocedural examination: Secondary | ICD-10-CM

## 2013-09-14 NOTE — Progress Notes (Signed)
OFFICE NOTE  Chief Complaint:  Return office visit  Primary Care Physician: Eartha Inch, MD  HPI:  Willie Owens is a 62 year old gentleman who was recently in the hospital for a pneumonia. He also had atrial fibrillation with rapid ventricular response, abnormal autoimmune testing with a positive ANA, as well as new onset hyperthyroidism and thyrotoxicosis. While hospitalized, he was treated for A-fib with rate control; however, due to a low CHADS2 score was not anticoagulated. He had an echocardiogram which showed severe biatrial enlargement thought maybe related to pulmonary or thyroid disease, A-fib or perhaps structural and unrelated. There was a question of alcohol use and how that would play a role in his symptoms as he was having tremors, but he reports that he has been able to stop his drinking and has had no symptoms of any withdrawal. This drinking consisted of about 1 vodka per day. At his last visit, I recommended starting propranolol XR for rate control. He is tolerating that well. He was also started on Xarelto which she is taking and has not had bleeding complications.  He was recently seen by endocrinology and his thyroid is now optimally controlled. It is felt that he could undergo cardioversion.  PMHx:  Past Medical History  Diagnosis Date  . Atrial fibrillation     with RVR while hospitalized in 2014  . Hyperthyroidism     thyrotoxicosis   . H/O [facility] spotted fever   . Gout   . History of antinuclear antibodies     Past Surgical History  Procedure Laterality Date  . Leg surgery    . Transthoracic echocardiogram  4/292014    EF 55-60%; severe bi-atrial enlargement; RV mod dilated    FAMHx:  Family History  Problem Relation Age of Onset  . Pulmonary embolism Neg Hx   . CAD Neg Hx     SOCHx:   reports that he quit smoking about 8 years ago. His smoking use included Cigarettes. He has a 60 pack-year smoking history. He has never used  smokeless tobacco. He reports that  drinks alcohol. He reports that he does not use illicit drugs.  ALLERGIES:  Allergies  Allergen Reactions  . Penicillins Other (See Comments)    Hallucinations, from childhood    ROS: A comprehensive review of systems was negative except for: Cardiovascular: positive for palpitations  HOME MEDS: Current Outpatient Prescriptions  Medication Sig Dispense Refill  . B Complex-C (SUPER B COMPLEX PO) Take 1 tablet by mouth daily.      . cholecalciferol (VITAMIN D) 1000 UNITS tablet Take 1,000 Units by mouth daily.      Marland Kitchen diltiazem (CARDIZEM CD) 120 MG 24 hr capsule Take 1 capsule (120 mg total) by mouth daily.  30 capsule  5  . diphenhydrAMINE (BENADRYL) 25 MG tablet Take 25 mg by mouth every evening.      . Ibuprofen-Diphenhydramine Cit (ADVIL PM PO) Take 2 tablets by mouth as needed.       . Melatonin 5 MG TABS Take 1 tablet by mouth every evening.      . methimazole (TAPAZOLE) 10 MG tablet Take 0.5 tablets (5 mg total) by mouth every other day.  30 tablet  2  . Multiple Vitamin (MULTIVITAMIN WITH MINERALS) TABS Take 1 tablet by mouth daily.      . propranolol ER (INDERAL LA) 80 MG 24 hr capsule Take 80 mg by mouth daily.      . Rivaroxaban (XARELTO) 20 MG TABS 20 mg  daily. 20 mg daily.       No current facility-administered medications for this visit.    LABS/IMAGING: No results found for this or any previous visit (from the past 48 hour(s)). No results found.  VITALS: BP 128/72  Pulse 83  Ht 6\' 5"  (1.956 m)  Wt 218 lb 14.4 oz (99.292 kg)  BMI 25.95 kg/m2  EXAM: deferred  EKG: Atrial fibrillation with controlled ventricular response at 83  ASSESSMENT: 1. Atrial fibrillation with controlled ventricular response 2. Hyperthyroidism - now euthyroid 3. History of alcohol abuse 4. Recent negative nuclear stress test on 05/25/2013  PLAN: 1.   Willie Owens has rate controlled atrial fibrillation. He is now euthyroid and has been on Xarelto  for several months.  I think this is her best opportunity to try to reestablish a sinus rhythm. We discussed cardioversion and he is agreed to proceed. We'll go ahead and schedule for elective outpatient cardioversion in the next few weeks.  Chrystie Nose, MD, [facility] Attending Cardiologist [facility]  HILTY,Kenneth C 09/14/2013, 4:16 PM

## 2013-09-14 NOTE — Patient Instructions (Addendum)
Dr. Rennis Golden has ordered a cardioversion, for Wednesday if possible. You will need to have some pre-procedure blood work done and a chest x-ray. Please go to [address] [employer] [facility]) - you can have lab wokr and chest x-ray both done here.

## 2013-09-17 ENCOUNTER — Other Ambulatory Visit: Payer: Self-pay | Admitting: Internal Medicine

## 2013-09-17 ENCOUNTER — Ambulatory Visit
Admission: RE | Admit: 2013-09-17 | Discharge: 2013-09-17 | Disposition: A | Discharge status: Home / Self Care | Payer: BC Managed Care – PPO | Source: Ambulatory Visit | Attending: Internal Medicine | Admitting: Internal Medicine

## 2013-09-17 DIAGNOSIS — Z01818 Encounter for other preprocedural examination: Secondary | ICD-10-CM

## 2013-09-18 LAB — CBC
MCH: 31.6 pg (ref 26.0–34.0)
MCV: 91.9 fL (ref 78.0–100.0)
Platelets: 192 10*3/uL (ref 150–400)
RBC: 4.93 MIL/uL (ref 4.22–5.81)
RDW: 16.9 % — ABNORMAL HIGH (ref 11.5–15.5)

## 2013-09-18 LAB — TSH: TSH: 0.836 u[IU]/mL (ref 0.350–4.500)

## 2013-09-18 LAB — BASIC METABOLIC PANEL
CO2: 27 mEq/L (ref 19–32)
Calcium: 9.2 mg/dL (ref 8.4–10.5)
Creat: 1.05 mg/dL (ref 0.50–1.35)
Sodium: 137 mEq/L (ref 135–145)

## 2013-09-18 LAB — PROTIME-INR
INR: 1.55 — ABNORMAL HIGH (ref <1.50)
Prothrombin Time: 18.3 seconds — ABNORMAL HIGH (ref 11.6–15.2)

## 2013-09-18 LAB — APTT: aPTT: 36 seconds (ref 24–37)

## 2013-09-20 MED ORDER — SODIUM CHLORIDE 0.9 % IV SOLN
INTRAVENOUS | Status: DC
  Administered 2013-09-21: 11:00:00 via INTRAVENOUS

## 2013-09-21 ENCOUNTER — Encounter (HOSPITAL_COMMUNITY)
Admission: RE | Disposition: A | Discharge status: Home / Self Care | Payer: Self-pay | Source: Ambulatory Visit | Attending: Internal Medicine

## 2013-09-21 ENCOUNTER — Ambulatory Visit (HOSPITAL_COMMUNITY): Payer: BC Managed Care – PPO | Admitting: Anesthesiology

## 2013-09-21 ENCOUNTER — Encounter (HOSPITAL_COMMUNITY): Payer: Self-pay | Admitting: Anesthesiology

## 2013-09-21 ENCOUNTER — Ambulatory Visit (HOSPITAL_COMMUNITY)
Admission: RE | Admit: 2013-09-21 | Discharge: 2013-09-21 | Disposition: A | Discharge status: Home / Self Care | Payer: BC Managed Care – PPO | Source: Ambulatory Visit | Attending: Internal Medicine | Admitting: Internal Medicine

## 2013-09-21 DIAGNOSIS — Z7901 Long term (current) use of anticoagulants: Secondary | ICD-10-CM | POA: Insufficient documentation

## 2013-09-21 DIAGNOSIS — J449 Chronic obstructive pulmonary disease, unspecified: Secondary | ICD-10-CM | POA: Insufficient documentation

## 2013-09-21 DIAGNOSIS — Z01818 Encounter for other preprocedural examination: Secondary | ICD-10-CM

## 2013-09-21 DIAGNOSIS — J4489 Other specified chronic obstructive pulmonary disease: Secondary | ICD-10-CM | POA: Insufficient documentation

## 2013-09-21 DIAGNOSIS — I4891 Unspecified atrial fibrillation: Secondary | ICD-10-CM | POA: Insufficient documentation

## 2013-09-21 HISTORY — PX: CARDIOVERSION: SHX1299

## 2013-09-21 SURGERY — CARDIOVERSION
Anesthesia: General

## 2013-09-21 MED ORDER — SODIUM CHLORIDE 0.9 % IV SOLN: INTRAVENOUS | Status: DC

## 2013-09-21 MED ORDER — SODIUM CHLORIDE 0.9 % IV SOLN
INTRAVENOUS | Status: DC | PRN
  Administered 2013-09-21: 11:00:00 via INTRAVENOUS

## 2013-09-21 MED ORDER — PROPOFOL 10 MG/ML IV BOLUS
INTRAVENOUS | Status: DC | PRN
  Administered 2013-09-21: 30 mg via INTRAVENOUS
  Administered 2013-09-21: 80 mg via INTRAVENOUS

## 2013-09-21 NOTE — Transfer of Care (Signed)
Immediate Anesthesia Transfer of Care Note  Patient: Willie Owens  Procedure(s) Performed: Procedure(s): CARDIOVERSION (N/A)  Patient Location: PACU and Endoscopy Unit  Anesthesia Type:General  Level of Consciousness: awake, alert  and oriented  Airway & Oxygen Therapy: Patient Spontanous Breathing and Patient connected to nasal cannula oxygen  Post-op Assessment: Report given to PACU RN, Post -op Vital signs reviewed and stable and Patient moving all extremities  Post vital signs: Reviewed and stable  Complications: No apparent anesthesia complications

## 2013-09-21 NOTE — CV Procedure (Signed)
   CARDIOVERSION NOTE  Procedure: Electrical Cardioversion Indications:  Atrial Fibrillation  Procedure Details:  Consent: Risks of procedure as well as the alternatives and risks of each were explained to the (patient/caregiver).  Consent for procedure obtained.  Time Out: Verified patient identification, verified procedure, site/side was marked, verified correct patient position, special equipment/implants available, medications/allergies/relevent history reviewed, required imaging and test results available.  Performed  Patient placed on cardiac monitor, pulse oximetry, supplemental oxygen as necessary.  Sedation given: Propofol per anesthesia Pacer pads placed anterior and posterior chest.  Cardioverted 2 time(s).  Cardioverted at 150J biphasic and 200J biphasic.  The second shock was successful in establishing sinus rhythm.  Impression: Findings: Post procedure EKG shows: NSR Complications: None Patient did tolerate procedure well.  Plan: 1. Successful DCCV to sinus rhythm.  2. Continue xarelto for stroke risk reduction. 3.   Continue Inderal and cardizem for the time being. 4.   Follow-up with me in 2 weeks in the office.  Time Spent Directly with the Patient:  30 minutes   Chrystie Nose, MD, [facility] Attending Cardiologist [facility]  Reighlyn Elmes C 09/21/2013, 12:33 PM

## 2013-09-21 NOTE — H&P (Signed)
    INTERVAL PROCEDURE H&P  History and Physical Interval Note:  09/21/2013 11:39 AM  Willie Owens has presented today for their planned procedure. The various methods of treatment have been discussed with the patient and family. After consideration of risks, benefits and other options for treatment, the patient has consented to the procedure.  The patients' outpatient history has been reviewed, patient examined, and no change in status from most recent office note within the past 30 days. I have reviewed the patients' chart and labs and will proceed as planned. Questions were answered to the patient's satisfaction.   Chrystie Nose, MD, [facility] Attending Cardiologist [facility]  HILTY,Kenneth C 09/21/2013, 11:39 AM

## 2013-09-21 NOTE — Addendum Note (Signed)
Addendum created 09/21/13 1158 by Rivka Barbara, MD   Modules edited: Anesthesia Attestations

## 2013-09-21 NOTE — Anesthesia Preprocedure Evaluation (Addendum)
Anesthesia Evaluation  Patient identified by MRN, date of birth, ID band Patient awake    Reviewed: Allergy & Precautions, H&P , NPO status , Patient's Chart, lab work & pertinent test results  Airway Mallampati: I      Dental  (+) Teeth Intact   Pulmonary COPDCurrent Smoker,    Pulmonary exam normal       Cardiovascular + dysrhythmias Atrial Fibrillation Rate:Normal     Neuro/Psych    GI/Hepatic   Endo/Other  Hyperthyroidism   Renal/GU      Musculoskeletal   Abdominal Normal abdominal exam  (+)   Peds  Hematology   Anesthesia Other Findings   Reproductive/Obstetrics                          Anesthesia Physical Anesthesia Plan  ASA: III  Anesthesia Plan: General   Post-op Pain Management:    Induction: Intravenous  Airway Management Planned:   Additional Equipment:   Intra-op Plan:   Post-operative Plan:   Informed Consent: I have reviewed the patients History and Physical, chart, labs and discussed the procedure including the risks, benefits and alternatives for the proposed anesthesia with the patient or authorized representative who has indicated his/her understanding and acceptance.     Plan Discussed with: CRNA, Anesthesiologist and Surgeon  Anesthesia Plan Comments:         Anesthesia Quick Evaluation

## 2013-09-21 NOTE — Anesthesia Postprocedure Evaluation (Signed)
  Anesthesia Post-op Note  Patient: Willie Owens  Procedure(s) Performed: Procedure(s): CARDIOVERSION (N/A)  Patient Location: PACU  Anesthesia Type:General  Level of Consciousness: awake, alert  and patient cooperative  Airway and Oxygen Therapy: Patient Spontanous Breathing and Patient connected to nasal cannula oxygen  Post-op Pain: none  Post-op Assessment: Post-op Vital signs reviewed, Patient's Cardiovascular Status Stable, Respiratory Function Stable, Patent Airway and No signs of Nausea or vomiting  Post-op Vital Signs: Reviewed and stable  Complications: No apparent anesthesia complications

## 2013-09-24 ENCOUNTER — Encounter (HOSPITAL_COMMUNITY): Payer: Self-pay | Admitting: Internal Medicine

## 2013-10-05 ENCOUNTER — Encounter: Payer: Self-pay | Admitting: Internal Medicine

## 2013-10-05 ENCOUNTER — Ambulatory Visit (INDEPENDENT_AMBULATORY_CARE_PROVIDER_SITE_OTHER): Payer: BC Managed Care – PPO | Admitting: Internal Medicine

## 2013-10-05 VITALS — BP 128/86 | HR 63 | Ht 78.0 in | Wt 216.8 lb

## 2013-10-05 DIAGNOSIS — R319 Hematuria, unspecified: Secondary | ICD-10-CM

## 2013-10-05 DIAGNOSIS — E0590 Thyrotoxicosis, unspecified without thyrotoxic crisis or storm: Secondary | ICD-10-CM

## 2013-10-05 DIAGNOSIS — I4891 Unspecified atrial fibrillation: Secondary | ICD-10-CM

## 2013-10-05 NOTE — Patient Instructions (Addendum)
Stay on the propranolol.  Stop the Diltiazem.   Your physician recommends that you schedule a follow-up appointment in: 3 months.

## 2013-10-07 ENCOUNTER — Encounter: Payer: Self-pay | Admitting: Internal Medicine

## 2013-10-07 NOTE — Progress Notes (Signed)
OFFICE NOTE  Chief Complaint:  Return office visit  Primary Care Physician: Willie Inch, MD  HPI:  Willie Owens is a 62 year old gentleman who was recently in the hospital for a pneumonia. He also had atrial fibrillation with rapid ventricular response, abnormal autoimmune testing with a positive ANA, as well as new onset hyperthyroidism and thyrotoxicosis. While hospitalized, he was treated for A-fib with rate control; however, due to a low CHADS2 score was not anticoagulated. He had an echocardiogram which showed severe biatrial enlargement thought maybe related to pulmonary or thyroid disease, A-fib or perhaps structural and unrelated. There was a question of alcohol use and how that would play a role in his symptoms as he was having tremors, but he reports that he has been able to stop his drinking and has had no symptoms of any withdrawal. This drinking consisted of about 1 vodka per day. At his last visit, I recommended starting propranolol XR for rate control. He is tolerating that well. He was also started on Xarelto which she is taking and has not had bleeding complications.  He was recently seen by endocrinology and his thyroid is now optimally controlled. It is felt that he could undergo cardioversion.  Ultimately he underwent successful cardioversion on 09/21/2013. He had 2 shocks and ultimately converted at 200 J. He was successful in maintaining sinus rhythm and was discharged for followup. Today in the office he is maintaining sinus rhythm with first degree AV block and underlying incomplete right bundle branch block at a rate of 63.  He is interested in coming off some medication if possible. He does report feeling remarkably better, especially on the Wednesday following the cardioversion he felt significantly better with more energy.  PMHx:  Past Medical History  Diagnosis Date  . Atrial fibrillation     with RVR while hospitalized in 2014  . Hyperthyroidism    thyrotoxicosis   . H/O [facility] spotted fever   . Gout   . History of antinuclear antibodies     Past Surgical History  Procedure Laterality Date  . Leg surgery    . Transthoracic echocardiogram  4/292014    EF 55-60%; severe bi-atrial enlargement; RV mod dilated  . Cardioversion N/A 09/21/2013    Procedure: CARDIOVERSION;  Surgeon: Willie Nose, MD;  Location: [facility] ENDOSCOPY;  Service: Cardiovascular;  Laterality: N/A;    FAMHx:  Family History  Problem Relation Age of Onset  . Pulmonary embolism Neg Hx   . CAD Neg Hx     SOCHx:   reports that he quit smoking about 8 years ago. His smoking use included Cigarettes. He has a 60 pack-year smoking history. He has never used smokeless tobacco. He reports that he drinks alcohol. He reports that he does not use illicit drugs.  ALLERGIES:  Allergies  Allergen Reactions  . Penicillins Other (See Comments)    Hallucinations, from childhood    ROS: A comprehensive review of systems was negative.  HOME MEDS: Current Outpatient Prescriptions  Medication Sig Dispense Refill  . B Complex-Owens (SUPER B COMPLEX PO) Take 1 tablet by mouth daily.      . cholecalciferol (VITAMIN D) 1000 UNITS tablet Take 1,000 Units by mouth daily.      Marland Kitchen diltiazem (CARDIZEM CD) 120 MG 24 hr capsule Take 1 capsule (120 mg total) by mouth daily.  30 capsule  5  . diphenhydrAMINE (BENADRYL) 25 MG tablet Take 25 mg by mouth every evening.      Marland Kitchen  Ibuprofen-Diphenhydramine Cit (ADVIL PM PO) Take 2 tablets by mouth as needed.       . Melatonin 5 MG TABS Take 1 tablet by mouth every evening.      . methimazole (TAPAZOLE) 10 MG tablet Take 0.5 tablets (5 mg total) by mouth every other day.  30 tablet  2  . Multiple Vitamin (MULTIVITAMIN WITH MINERALS) TABS Take 1 tablet by mouth daily.      . propranolol ER (INDERAL LA) 80 MG 24 hr capsule Take 80 mg by mouth daily.      . Rivaroxaban (XARELTO) 20 MG TABS 20 mg daily. 20 mg daily.       No current  facility-administered medications for this visit.    LABS/IMAGING: No results found for this or any previous visit (from the past 48 hour(s)). No results found.  VITALS: BP 128/86  Pulse 63  Ht 6\' 6"  (1.981 m)  Wt 216 lb 12.8 oz (98.34 kg)  BMI 25.06 kg/m2  EXAM: deferred  EKG: Sinus rhythm with first degree AV block at 63  ASSESSMENT: 1. Paroxysmal atrial fibrillation-converted to sinus rhythm with cardioversion 2. Hyperthyroidism - now euthyroid 3. History of alcohol abuse 4. Recent negative nuclear stress test on 05/25/2013 5. Anticoagulation with Xarelto  PLAN: 1.   Willie Owens successfully converted to sinus rhythm and now feels significantly better. He has been maintaining sinus rhythm for over a week now and is markedly improved with his energy levels. He continues to take Xarelto without any bleeding complications. He is interested in coming off of some medicine and I elected to discontinue his low-dose diltiazem. He should remain on that propranolol to give him both the benefits of a beta blocker as well as some of its nonselective alpha properties.  I advised him that if he continues to maintain good control of her thyroid and is able to stay off of alcohol, there is a good likelihood he may stay in a sinus rhythm. If he should have recurrence we may need to consider antiarrhythmic medication therapy. I'll plan to see him back in 3 months or sooner as necessary.  Thank you again for allowing me to participate in his care.  Willie Nose, MD, [facility] Attending Cardiologist [facility]  HILTY,Willie Owens 10/07/2013, 5:45 PM

## 2013-10-09 ENCOUNTER — Encounter: Payer: Self-pay | Admitting: Internal Medicine

## 2013-10-12 NOTE — ED Provider Notes (Signed)
Order(s) created erroneously. Erroneous order ID: [id number] Order moved by: Archie Endo B Order move date/time: 10/12/2013  3:47 PM Source Patient:    [id number] Source Contact: 10/09/2013 Destination Patient:   [id number] Destination Contact: 03/13/2013

## 2013-11-01 ENCOUNTER — Other Ambulatory Visit: Payer: Self-pay

## 2013-11-13 ENCOUNTER — Other Ambulatory Visit: Payer: Self-pay | Admitting: Internal Medicine

## 2013-11-13 NOTE — Telephone Encounter (Signed)
Rx was sent to pharmacy electronically. 

## 2013-12-05 ENCOUNTER — Other Ambulatory Visit: Payer: Self-pay | Admitting: Internal Medicine

## 2014-01-11 ENCOUNTER — Ambulatory Visit: Payer: BC Managed Care – PPO | Admitting: Emergency Medicine

## 2014-01-21 ENCOUNTER — Ambulatory Visit: Payer: BC Managed Care – PPO | Admitting: Internal Medicine

## 2014-01-22 ENCOUNTER — Other Ambulatory Visit (INDEPENDENT_AMBULATORY_CARE_PROVIDER_SITE_OTHER): Payer: BC Managed Care – PPO

## 2014-01-22 ENCOUNTER — Ambulatory Visit (INDEPENDENT_AMBULATORY_CARE_PROVIDER_SITE_OTHER): Payer: BC Managed Care – PPO | Admitting: Internal Medicine

## 2014-01-22 ENCOUNTER — Encounter: Payer: Self-pay | Admitting: Internal Medicine

## 2014-01-22 VITALS — BP 148/82 | HR 76 | Ht 78.0 in | Wt 215.1 lb

## 2014-01-22 DIAGNOSIS — E0590 Thyrotoxicosis, unspecified without thyrotoxic crisis or storm: Secondary | ICD-10-CM

## 2014-01-22 DIAGNOSIS — I4891 Unspecified atrial fibrillation: Secondary | ICD-10-CM

## 2014-01-22 LAB — T4, FREE: Free T4: 1.09 ng/dL (ref 0.60–1.60)

## 2014-01-22 LAB — TSH: TSH: 0.05 u[IU]/mL — ABNORMAL LOW (ref 0.35–5.50)

## 2014-01-22 NOTE — Patient Instructions (Signed)
Your physician wants you to follow-up in: 1 year. You will receive a reminder letter in the mail two months in advance. If you don't receive a letter, please call our office to schedule the follow-up appointment.  Stop Xarelto. Start Aspirin 81mg  once daily.

## 2014-01-22 NOTE — Progress Notes (Signed)
OFFICE NOTE  Chief Complaint:  Return office visit  Primary Care Physician: Willie Noon, MD  HPI:  Willie Owens is a 63 year old gentleman who was recently in the hospital for a pneumonia. He also had atrial fibrillation with rapid ventricular response, abnormal autoimmune testing with a positive ANA, as well as new onset hyperthyroidism and thyrotoxicosis. While hospitalized, he was treated for A-fib with rate control; however, due to a low CHADS2 score was not anticoagulated. He had an echocardiogram which showed severe biatrial enlargement thought maybe related to pulmonary or thyroid disease, A-fib or perhaps structural and unrelated. There was a question of alcohol use and how that would play a role in his symptoms as he was having tremors, but he reports that he has been able to stop his drinking and has had no symptoms of any withdrawal. This drinking consisted of about 1 vodka per day. At his last visit, I recommended starting propranolol XR for rate control. He is tolerating that well. He was also started on Xarelto which she is taking and has not had bleeding complications.  He was recently seen by endocrinology and his thyroid is now optimally controlled. It is felt that he could undergo cardioversion.  Ultimately he underwent successful cardioversion on 09/21/2013. He had 2 shocks and ultimately converted at 200 J. He was successful in maintaining sinus rhythm and was discharged for followup. Today in the office he is maintaining sinus rhythm with first degree AV block and underlying incomplete right bundle branch block at a rate of 63.  He is interested in coming off some medication if possible. He does report feeling remarkably better, especially on the Wednesday following the cardioversion he felt significantly better with more energy.  Willie Owens today he is feeling quite well. He returns for ongoing followup of his atrial fibrillation which I believe is related to  hyperthyroidism. This thyroid is now been well regulated for months. He continues to feel well and is maintaining sinus rhythm. I discontinued his diltiazem at the last office visit, but would recommend he stay on his propranolol.  PMHx:  Past Medical History  Diagnosis Date  . Atrial fibrillation     with RVR while hospitalized in 2014  . Hyperthyroidism     thyrotoxicosis   . H/O [facility] spotted fever   . Gout   . History of antinuclear antibodies     Past Surgical History  Procedure Laterality Date  . Leg surgery    . Transthoracic echocardiogram  4/292014    EF 55-60%; severe bi-atrial enlargement; RV mod dilated  . Cardioversion N/A 09/21/2013    Procedure: CARDIOVERSION;  Surgeon: Willie Casino, MD;  Location: [facility] ENDOSCOPY;  Service: Cardiovascular;  Laterality: N/A;    FAMHx:  Family History  Problem Relation Age of Onset  . Pulmonary embolism Neg Hx   . CAD Neg Hx     SOCHx:   reports that he quit smoking about 9 years ago. His smoking use included Cigarettes. He has a 60 pack-year smoking history. He has never used smokeless tobacco. He reports that he drinks alcohol. He reports that he does not use illicit drugs.  ALLERGIES:  Allergies  Allergen Reactions  . Penicillins Other (See Comments)    Hallucinations, from childhood    ROS: A comprehensive review of systems was negative.  HOME MEDS: Current Outpatient Prescriptions  Medication Sig Dispense Refill  . aspirin 81 MG tablet Take 81 mg by mouth daily.      Marland Kitchen  B Complex-C (SUPER B COMPLEX PO) Take 1 tablet by mouth daily.      . cholecalciferol (VITAMIN D) 1000 UNITS tablet Take 1,000 Units by mouth daily.      Marland Kitchen diltiazem (CARDIZEM CD) 120 MG 24 hr capsule Take 1 capsule (120 mg total) by mouth daily.  30 capsule  5  . diphenhydrAMINE (BENADRYL) 25 MG tablet Take 25 mg by mouth every evening.      . Ibuprofen-Diphenhydramine Cit (ADVIL PM PO) Take 2 tablets by mouth as needed.       . Melatonin  5 MG TABS Take 1 tablet by mouth every evening.      . methimazole (TAPAZOLE) 10 MG tablet Take 0.5 tablets (5 mg total) by mouth every other day.  30 tablet  2  . Multiple Vitamin (MULTIVITAMIN WITH MINERALS) TABS Take 1 tablet by mouth daily.      . propranolol ER (INDERAL LA) 80 MG 24 hr capsule TAKE 1 CAPSULE BY MOUTH ONCE DAILY  30 capsule  5   No current facility-administered medications for this visit.    LABS/IMAGING: No results found for this or any previous visit (from the past 48 hour(s)). No results found.  VITALS: BP 148/82  Pulse 76  Ht 6\' 6"  (1.981 m)  Wt 215 lb 1.6 oz (97.569 kg)  BMI 24.86 kg/m2  EXAM: GEN: Awake, NAD LUNGS: Clear CV: RRR, s1/s2, faint 2/6 SEM At apex Ext: no edema  EKG: Sinus rhythm with first degree AV block at 76  ASSESSMENT: 1. Paroxysmal atrial fibrillation-converted to sinus rhythm with cardioversion 2. Hyperthyroidism - now euthyroid 3. History of alcohol abuse 4. Recent negative nuclear stress test on 05/25/2013 5. Anticoagulation with Xarelto  PLAN: 1.   Willie Owens is doing quite well without recurrence of his atrial fibrillation. He has a low CHADS2VASC score of 1.  At this point he's been anticoagulated post cardioversion for 5 months. I think this is adequate anticoagulation for him as his risk of recurrence is likely low now that his thyroid is regulated. Therefore I would recommend him going on 2 aspirin 81 mg daily and discontinuing Xarelto. He should stay on his propranolol.  We will plan followup annually or sooner if necessary.  Thank you again for allowing me to participate in his care.  Willie Casino, MD, [facility] Attending Cardiologist [facility] 01/22/2014, 4:39 PM

## 2014-01-25 ENCOUNTER — Ambulatory Visit: Payer: BC Managed Care – PPO | Admitting: Internal Medicine

## 2014-01-25 DIAGNOSIS — Z0289 Encounter for other administrative examinations: Secondary | ICD-10-CM

## 2014-01-28 ENCOUNTER — Other Ambulatory Visit: Payer: Self-pay | Admitting: Internal Medicine

## 2014-01-28 DIAGNOSIS — E0590 Thyrotoxicosis, unspecified without thyrotoxic crisis or storm: Secondary | ICD-10-CM

## 2014-01-28 MED ORDER — METHIMAZOLE 5 MG PO TABS: 5.0000 mg | ORAL_TABLET | Freq: Three times a day (TID) | ORAL | Status: DC

## 2014-05-09 ENCOUNTER — Other Ambulatory Visit (INDEPENDENT_AMBULATORY_CARE_PROVIDER_SITE_OTHER): Payer: Managed Care, Other (non HMO)

## 2014-05-09 DIAGNOSIS — E0590 Thyrotoxicosis, unspecified without thyrotoxic crisis or storm: Secondary | ICD-10-CM

## 2014-05-09 LAB — T4, FREE: FREE T4: 0.72 ng/dL (ref 0.60–1.60)

## 2014-05-09 LAB — TSH: TSH: 1.71 u[IU]/mL (ref 0.35–4.50)

## 2014-05-09 LAB — T3, FREE: T3, Free: 2.6 pg/mL (ref 2.3–4.2)

## 2014-05-10 ENCOUNTER — Encounter: Payer: Self-pay | Admitting: Internal Medicine

## 2014-05-27 ENCOUNTER — Other Ambulatory Visit: Payer: Self-pay | Admitting: Internal Medicine

## 2014-05-27 NOTE — Telephone Encounter (Signed)
Rx was sent to pharmacy electronically. 

## 2014-06-19 ENCOUNTER — Encounter: Payer: Self-pay | Admitting: Internal Medicine

## 2014-06-19 ENCOUNTER — Ambulatory Visit (INDEPENDENT_AMBULATORY_CARE_PROVIDER_SITE_OTHER): Payer: Managed Care, Other (non HMO) | Admitting: Internal Medicine

## 2014-06-19 VITALS — BP 108/58 | HR 70 | Temp 97.6°F | Resp 12 | Wt 214.8 lb

## 2014-06-19 DIAGNOSIS — E0590 Thyrotoxicosis, unspecified without thyrotoxic crisis or storm: Secondary | ICD-10-CM

## 2014-06-19 DIAGNOSIS — E0500 Thyrotoxicosis with diffuse goiter without thyrotoxic crisis or storm: Secondary | ICD-10-CM

## 2014-06-19 DIAGNOSIS — R1314 Dysphagia, pharyngoesophageal phase: Secondary | ICD-10-CM | POA: Insufficient documentation

## 2014-06-19 LAB — TSH: TSH: 1.03 u[IU]/mL (ref 0.35–4.50)

## 2014-06-19 LAB — T3, FREE: T3, Free: 2.6 pg/mL (ref 2.3–4.2)

## 2014-06-19 LAB — T4, FREE: Free T4: 0.73 ng/dL (ref 0.60–1.60)

## 2014-06-19 MED ORDER — METHIMAZOLE 5 MG PO TABS: 2.5000 mg | ORAL_TABLET | Freq: Three times a day (TID) | ORAL | Status: DC

## 2014-06-19 NOTE — Progress Notes (Addendum)
Patient ID: Willie Owens, male   DOB: 1951-09-28, 63 y.o.   MRN: [id number]   HPI  Willie Owens is a 63 y.o.-year-old male, returning for f/u for Graves disease, in remission. Last visit 11 mo ago.  He c/o dysphagia x 2 mo - for solid foods but also milk shakes, odynophagia, lumps in neck or SOB with lying down.   Reviewed hx: Pt was found to have a low TSH in 04/24/2013, when he presented to the ED for A. Fib with RVR, hematuria. His TSH returned undetectable with high free T4 and free T3:  Component     Latest Ref Rng 04/24/2013  TSH     0.35 - 5.50 uIU/mL <0.008 (L)  Free T4     0.60 - 1.60 ng/dL 3.12 (H)  T3, Free     2.3 - 4.2 pg/mL 4.6 (H)  A CBC showed normal white blood count. ESR 25 (0-16). Cortisol 11.3 at 4:25 AM. He was started on Toprol-XL 25 mg daily - subsequently stopped 05/12, and started Propranolol 80 daily and methimazole 20 mg 3 times a day at that time.   Labs were repeated on 04/30/2013, and a TSH was 0.009, free T4 3.44, total T3 10 (2-4.4). At that time, a TSI titer returned elevated, at 142% (< 139%), and a TPO antibody titer also returned elevated at 486 (less than 34).  A repeat set of labs on 05/22/2013, showed a TSH of 0.02, free T4 2.65 (0.61-1.12), free T3 8.1 (2-4.4). He had a stress test >> normal.   An uptake and scan (06/08/2013) showed increased uptake a 52%, uniformly increased throughout the thyroid, except for a subtle cold defect in the inferior pole of the left lobe.   An ultrasound showed that this corresponds to a 1.2 cm nodule. I referred the patient for an FNA of the nodule, however the nodule was not substantiated on the prebiopsy ultrasound exam, so the biopsy was canceled.  Pt's TFTs improved and at last check: they were normal, on 5 mg MMI daily: Component     Latest Ref Rng 05/09/2014  TSH     0.35 - 4.50 uIU/mL 1.71  T3, Free     2.3 - 4.2 pg/mL 2.6  Free T4     0.60 - 1.60 ng/dL 0.72  I advised him to continue this  dose.  Pt denies:   heat intolerance  Anxiety  Palpitations  Insomnia  hyperdefecation  tremors  No weight changes  PMH: He also has a history of pulmonary nodules, alcohol withdrawal, history of tobacco abuse.  ROS: Constitutional: see HPI Eyes: no blurry vision, no xerophthalmia ENT: no sore throat, no nodules palpated in throat, + dysphagia/no odynophagia, no hoarseness Cardiovascular: no CP/SOB/palpitations/leg swelling Respiratory: no cough/no SOB Gastrointestinal: no N/V/D/C, + gerd Musculoskeletal: no muscle/joint aches Skin: no rashes Neurological: no tremors/no numbness/tingling/dizziness  I reviewed pt's medications, allergies, PMH, social hx, family hx and no changes required, except as mentioned above. Started Nexium.   PE: BP 108/58  Pulse 70  Temp(Src) 97.6 F (36.4 C) (Oral)  Resp 12  Wt 214 lb 12.8 oz (97.433 kg)  SpO2 97% Wt Readings from Last 3 Encounters:  06/19/14 214 lb 12.8 oz (97.433 kg)  01/22/14 215 lb 1.6 oz (97.569 kg)  10/05/13 216 lb 12.8 oz (98.34 kg)   Constitutional: thin, in NAD  Eyes: PERRLA, EOMI, no exophthalmos, no lid lag, no stare  ENT: moist mucous membranes, slight thyromegaly, L > R, no thyroid  bruits; no cervical lymphadenopathy  Cardiovascular: irreg irreg, RR, No MRG  Respiratory: CTA B  Gastrointestinal: abdomen soft, NT, ND, BS+  Musculoskeletal: no deformities, strength intact in all 4  Skin: moist, warm, no rashes  Neurological: no tremor with outstretched hands, DTR normal in all 4   ASSESSMENT:  1. Graves Ds.  - Thyroid Uptake and scan (06/08/2013): 24-hour radioiodine uptake 53%. Diffusely increased tracer localization throughout both thyroid lobes. Subtle cold nodule questioned at inferior pole left lobe. No focal areas of increased tracer localization seen.  - Thyroid ultrasound (06/22/2013):  The echogenicity of the thyroid parenchyma is diffusely inhomogeneous.   Right thyroid lobe: 4.9 x 2.7 x  2.5 cm.   Left thyroid lobe: 5.7 x 2.1 x 2.6 cm.   Isthmus: 8.2 mm in thickness.   Focal nodules: Several poorly defined solid nodules are noted bilaterally.  - At the site of the "cold defect" questioned on thyroid nuclear medicine scan in the inferior left lobe of thyroid, there is a poorly defined solid nodule present of 1.2 x 0.9 x 1.2 cm.  - A solid nodule in the left mid lobe measures 1.1 x 0.9 x 1.0 cm.  - A 6 mm solid nodule is noted in the upper pole of the left lobe.  - mid upper right lobe 1.3 x 0.7 x 1.1 cm - solid nodule in the lower pole of the right lobe of 1.1 x 0.7 x 0.9 cm.   Lymphadenopathy: Lymph nodes are present, none of which appear enlarged.   - repeat thyroid ultrasound (07/06/2013) - during appointment for FNA: diffuse heterogeneous architecture of the left thyroid  gland with scattered sub centimeter nodularity.  - The previously described subtle 12 mm inferior pole nodule is not reproducible on today's exam. Therefore biopsy was not performed. This appears to represent a pseudo lesion.   - PFTs show moderately severe to severe obstruction (sees Dr. Lamonte Sakai) PULMONARY FUNCTON TEST  06/20/2013   FVC  3.68   FEV1  2.29   FEV1/FVC  62.2   FVC % Predicted  64   FEV % Predicted  52   FeF 25-75  3.51   FeF 25-75 % Predicted  5.47   A CT chest showed improved appearance of previously seen lung nodules.  2. Dysphagia  PLAN:  1. Pt with Graves ds - in remission - dx was made on the basis of increased uniform thyroid tracer uptake and the presence of TSI antibodies - the patient's thyroid tests greatly improved on methimazole, now on 5 mg daily, with normal TFTs at last check 1.5 mo ago - if tests still normal, will decrease dose to 2.5 mg daily - he is asymptomatic - advised to RTC in 6 mo  2. Dysphagia - he had 2 thyroid nodules on the thyroid U/S from 1 year ago - I referred him for Bx of the dominant nodule, but this was not substantiated on the new U/S at  the time of the scheduled Bx - will refer for a new U/S and may need Bx of the smaller, L thyroid nodule - will have appt with GI next mo; he does have a h/o GERD, ? irritaton from acid reflux  Office Visit on 06/19/2014  Component Date Value Ref Range Status  . TSH 06/19/2014 1.03  0.35 - 4.50 uIU/mL Final  . Free T4 06/19/2014 0.73  0.60 - 1.60 ng/dL Final  . T3, Free 06/19/2014 2.6  2.3 - 4.2 pg/mL Final   Msg  sent; Dear Willie Owens,  Labs still normal, please decrease the Methimazole to 2.5 mg daily and come back for labs in 6 weeks. Sincerely, Philemon Kingdom MD  Thyroid U/S:CLINICAL DATA: History of thyroid nodules, now with dysphagia  EXAM: THYROID ULTRASOUND  TECHNIQUE: Ultrasound examination of the thyroid gland and adjacent soft tissues was performed.  COMPARISON: Thyroid ultrasound - 06/22/2013; attempted ultrasound-guided thyroid nodule biopsy - 07/06/2013 (biopsy not performed secondary to suspicion of a pseudo nodule within the inferior aspect of the left lobe of the thyroid)  FINDINGS: Right thyroid lobe  Measurements: Normal in size measuring 4.8 x 2.7 x 2.4 cm.  There is diffuse heterogeneity of the right lobe of the thyroid without discrete nodule.  Left thyroid lobe  Measurements: Normal in size measuring 4.9 x 2.1 x 2.0 cm.  There is diffuse heterogeneity of the left lobe of the thyroid without discrete nodule.  Isthmus  Thickness: Enlarged measuring 0.9 cm in diameter, unchanged.  There is diffuse heterogeneity of the thyroid isthmus without discrete nodule.  Lymphadenopathy  None visualized.  IMPRESSION: Unchanged nonspecific heterogeneity of the thyroid parenchyma without discrete nodule.   Electronically Signed By: Sandi Mariscal M.D. On: 06/21/2014 16:51  No evidence of thyroid as a culprit for his dysphagia >> will await GI eval and EGD.

## 2014-06-19 NOTE — Patient Instructions (Signed)
Please stop at the lab. For now, continue Methimazole 5 mg daily. You will be called with the ultrasound scheduling.

## 2014-06-21 ENCOUNTER — Ambulatory Visit
Admission: RE | Admit: 2014-06-21 | Discharge: 2014-06-21 | Disposition: A | Discharge status: Home / Self Care | Payer: Managed Care, Other (non HMO) | Source: Ambulatory Visit | Attending: Internal Medicine | Admitting: Internal Medicine

## 2014-06-26 DIAGNOSIS — C801 Malignant (primary) neoplasm, unspecified: Secondary | ICD-10-CM

## 2014-06-26 HISTORY — DX: Malignant (primary) neoplasm, unspecified: C80.1

## 2014-07-03 ENCOUNTER — Encounter: Payer: Self-pay | Admitting: Internal Medicine

## 2014-07-08 ENCOUNTER — Encounter: Payer: Self-pay | Admitting: Internal Medicine

## 2014-07-08 ENCOUNTER — Ambulatory Visit (INDEPENDENT_AMBULATORY_CARE_PROVIDER_SITE_OTHER): Payer: Managed Care, Other (non HMO) | Admitting: Internal Medicine

## 2014-07-08 VITALS — BP 118/60 | HR 60 | Ht 78.0 in | Wt 207.2 lb

## 2014-07-08 DIAGNOSIS — K2100 Gastro-esophageal reflux disease with esophagitis, without bleeding: Secondary | ICD-10-CM

## 2014-07-08 DIAGNOSIS — R1310 Dysphagia, unspecified: Secondary | ICD-10-CM

## 2014-07-08 DIAGNOSIS — Z1211 Encounter for screening for malignant neoplasm of colon: Secondary | ICD-10-CM

## 2014-07-08 MED ORDER — ESOMEPRAZOLE MAGNESIUM 40 MG PO CPDR: 40.0000 mg | DELAYED_RELEASE_CAPSULE | Freq: Two times a day (BID) | ORAL | Status: DC

## 2014-07-08 MED ORDER — PEG-KCL-NACL-NASULF-NA ASC-C 100 G PO SOLR: 1.0000 | Freq: Once | ORAL | Status: DC

## 2014-07-08 MED ORDER — SUCRALFATE 1 GM/10ML PO SUSP: 1.0000 g | Freq: Three times a day (TID) | ORAL | Status: DC

## 2014-07-08 NOTE — Patient Instructions (Addendum)
You have been scheduled for a Barium Esophogram at [facility] (1st floor of the hospital) on 07/12/2014 at 10:30am. Please arrive 15 minutes prior to your appointment for registration. Make certain not to have anything to eat or drink 6 hours prior to your test. If you need to reschedule for any reason, please contact radiology at [phone] to do so. __________________________________________________________________ A barium swallow is an examination that concentrates on views of the esophagus. This tends to be a double contrast exam (barium and two liquids which, when combined, create a gas to distend the wall of the oesophagus) or single contrast (non-ionic iodine based). The study is usually tailored to your symptoms so a good history is essential. Attention is paid during the study to the form, structure and configuration of the esophagus, looking for functional disorders (such as aspiration, dysphagia, achalasia, motility and reflux) EXAMINATION You may be asked to change into a gown, depending on the type of swallow being performed. A radiologist and radiographer will perform the procedure. The radiologist will advise you of the type of contrast selected for your procedure and direct you during the exam. You will be asked to stand, sit or lie in several different positions and to hold a small amount of fluid in your mouth before being asked to swallow while the imaging is performed .In some instances you may be asked to swallow barium coated marshmallows to assess the motility of a solid food bolus. The exam can be recorded as a digital or video fluoroscopy procedure. POST PROCEDURE It will take 1-2 days for the barium to pass through your system. To facilitate this, it is important, unless otherwise directed, to increase your fluids for the next 24-48hrs and to resume your normal diet.  This test typically takes about 30 minutes to  perform.     __________________________________________________________________________________  Willie Owens have been scheduled for an endoscopy and colonoscopy. Please follow the written instructions given to you at your visit today. Please pick up your prep at the pharmacy within the next 1-3 days. If you use inhalers (even only as needed), please bring them with you on the day of your procedure. Your physician has requested that you go to www.startemmi.com and enter the access code given to you at your visit today. This web site gives a general overview about your procedure. However, you should still follow specific instructions given to you by our office regarding your preparation for the procedure.    Generic Nexium sent to your pharmacy Carafate sent to your pharmacy Moviprep sent to your pharmacy free voucher given

## 2014-07-08 NOTE — Progress Notes (Signed)
Patient ID: Willie Owens, male   DOB: 1951/12/06, 63 y.o.   MRN: [id number] HPI: Willie Owens is a 63 year old male with a past medical history of Graves' disease, A. fib, gout who seen in consultation at the request of Dr. Renne Crigler for evaluation of odynophagia and dysphagia. He is here today with his wife. Patient reports he developed trouble swallowing 2 months ago which started after taking a multivitamin. This was associated with burning substernal chest discomfort and solid food dysphagia. He reports the burning was severe for 2 or 3 days but has improved somewhat but his dysphagia has persisted. He has lost approximately 7 pounds which he attributes to poor by mouth intake. He does report a history of heartburn which is also been described as "severe" and he is now taking over-the-counter Nexium 2 tablets in the morning for a total 40 mg daily. This has completely resolved the heartburn but the dysphagia and odynophagia has continued. He denies nausea or vomiting. He has had some regurgitation for food which did not passed normally into the stomach. He reports normal bowel movements without diarrhea, constipation, melena or bright red blood per rectum. He has never had upper or lower endoscopy. He denies a family history of colon cancer or polyps. He does have a significant history of prior tobacco abuse at least 60 pack years, he quit smoking almost 10 years ago  Past Medical History  Diagnosis Date  . Atrial fibrillation     with RVR while hospitalized in 2014  . Hyperthyroidism     thyrotoxicosis , graves  . H/O [facility] spotted fever   . Gout   . History of antinuclear antibodies     Past Surgical History  Procedure Laterality Date  . Leg surgery      Fx, rod removed  . Transthoracic echocardiogram  4/292014    EF 55-60%; severe bi-atrial enlargement; RV mod dilated  . Cardioversion N/A 09/21/2013    Procedure: CARDIOVERSION;  Surgeon: Pixie Casino, MD;  Location: [facility]  ENDOSCOPY;  Service: Cardiovascular;  Laterality: N/A;  . Tonsillectomy and adenoidectomy      Outpatient Prescriptions Prior to Visit  Medication Sig Dispense Refill  . B Complex-C (SUPER B COMPLEX PO) Take 1 tablet by mouth daily.      . cholecalciferol (VITAMIN D) 1000 UNITS tablet Take 1,000 Units by mouth daily.      . Multiple Vitamin (MULTIVITAMIN WITH MINERALS) TABS Take 1 tablet by mouth daily.      . propranolol ER (INDERAL LA) 80 MG 24 hr capsule TAKE 1 CAPSULE BY MOUTH EVERY DAY  30 capsule  7  . methimazole (TAPAZOLE) 5 MG tablet Take 0.5 tablets (2.5 mg total) by mouth 3 (three) times daily.  60 tablet  2  . aspirin 81 MG tablet Take 81 mg by mouth daily.      . diphenhydrAMINE (BENADRYL) 25 MG tablet Take 25 mg by mouth every evening.      . Esomeprazole Magnesium (NEXIUM PO) Take 40 mg by mouth daily.       . Ibuprofen-Diphenhydramine Cit (ADVIL PM PO) Take 2 tablets by mouth as needed.       . Melatonin 5 MG TABS Take 1 tablet by mouth every evening.       No facility-administered medications prior to visit.    Allergies  Allergen Reactions  . Penicillins Other (See Comments)    Hallucinations, from childhood    Family History  Problem Relation Age of Onset  .  Pulmonary embolism Neg Hx   . CAD Neg Hx   . Colon cancer Neg Hx   . Diabetes Mother     History  Substance Use Topics  . Smoking status: Former Smoker -- 2.00 packs/day for 30 years    Types: Cigarettes    Quit date: 12/30/2004  . Smokeless tobacco: Never Used  . Alcohol Use: Yes     Comment: vodka daily    ROS: As per history of present illness, otherwise negative  BP 118/60  Pulse 60  Ht 6\' 6"  (1.981 m)  Wt 207 lb 3.2 oz (93.985 kg)  BMI 23.95 kg/m2 Constitutional: Well-developed and well-nourished. No distress. HEENT: Normocephalic and atraumatic. Oropharynx is clear and moist. No oropharyngeal exudate. Conjunctivae are normal.  No scleral icterus. Neck: Neck supple. Trachea  midline. Cardiovascular: Normal rate, regular rhythm and intact distal pulses. No M/R/G Pulmonary/chest: Effort normal and breath sounds normal. No wheezing, rales or rhonchi. Abdominal: Soft, nontender, nondistended. Bowel sounds active throughout. There are no masses palpable. No hepatosplenomegaly. Extremities: no clubbing, cyanosis, or edema Lymphadenopathy: No cervical adenopathy noted. Neurological: Alert and oriented to person place and time. Skin: Skin is warm and dry. No rashes noted. Psychiatric: Normal mood and affect. Behavior is normal.  RELEVANT LABS AND IMAGING: CBC    Component Value Date/Time   WBC 8.2 09/17/2013 1518   RBC 4.93 09/17/2013 1518   HGB 15.6 09/17/2013 1518   HCT 45.3 09/17/2013 1518   PLT 192 09/17/2013 1518   MCV 91.9 09/17/2013 1518   MCH 31.6 09/17/2013 1518   MCHC 34.4 09/17/2013 1518   RDW 16.9* 09/17/2013 1518   LYMPHSABS 2.1 04/24/2013 0430   MONOABS 1.2* 04/24/2013 0430   EOSABS 0.1 04/24/2013 0430   BASOSABS 0.0 04/24/2013 0430    CMP     Component Value Date/Time   NA 137 09/17/2013 1518   K 4.3 09/17/2013 1518   CL 103 09/17/2013 1518   CO2 27 09/17/2013 1518   GLUCOSE 94 09/17/2013 1518   BUN 13 09/17/2013 1518   CREATININE 1.05 09/17/2013 1518   CREATININE 0.67 04/26/2013 0425   CALCIUM 9.2 09/17/2013 1518   PROT 6.1 04/26/2013 0425   ALBUMIN 2.4* 04/26/2013 0425   AST 26 04/26/2013 0425   ALT 28 04/26/2013 0425   ALKPHOS 96 04/26/2013 0425   BILITOT 1.2 04/26/2013 0425   GFRNONAA >90 04/26/2013 0425   GFRAA >90 04/26/2013 0425    ASSESSMENT/PLAN: 63 year old male with a past medical history of Graves' disease, A. fib, gout who seen in consultation at the request of Dr. Renne Crigler for evaluation of odynophagia and dysphagia.  1. Odynophagia/dysphagia/pill esophagitis/GERD -- it does sound like he developed a pill esophagitis approximately 2 months ago. Prior to this he has had trouble with heartburn, but since he has had solid dysphagia. I recommend upper  endoscopy for direct visualization. He is traveling the remainder of this week and next and so we will perform a barium swallow with tablet prior to endoscopy. I would like to increase his Nexium to 40 mg twice daily and add liquid Carafate before meals and at bedtime until the time of his endoscopy. We discussed the risks and benefits of this test and he is agreeable to proceed  2.  CRC screening -- average risk, no prior colonoscopy. Colonoscopy was recommended and after discussion of the risks and benefits he agrees to proceed. We will proceed with colonoscopy on the same day as his upper endoscopy.

## 2014-07-09 ENCOUNTER — Encounter: Payer: Self-pay | Admitting: Internal Medicine

## 2014-07-09 ENCOUNTER — Telehealth: Payer: Self-pay | Admitting: *Deleted

## 2014-07-09 ENCOUNTER — Encounter: Payer: Self-pay | Admitting: *Deleted

## 2014-07-09 NOTE — Telephone Encounter (Signed)
Need call back from pt of PPIs he has tried and failed

## 2014-07-11 NOTE — Telephone Encounter (Signed)
Faxed form today   No call back from pt

## 2014-07-12 ENCOUNTER — Ambulatory Visit (HOSPITAL_COMMUNITY)
Admission: RE | Admit: 2014-07-12 | Discharge: 2014-07-12 | Disposition: A | Discharge status: Home / Self Care | Payer: Managed Care, Other (non HMO) | Source: Ambulatory Visit | Attending: Internal Medicine | Admitting: Internal Medicine

## 2014-07-12 DIAGNOSIS — K449 Diaphragmatic hernia without obstruction or gangrene: Secondary | ICD-10-CM | POA: Insufficient documentation

## 2014-07-12 DIAGNOSIS — R1310 Dysphagia, unspecified: Secondary | ICD-10-CM | POA: Insufficient documentation

## 2014-07-15 ENCOUNTER — Ambulatory Visit (AMBULATORY_SURGERY_CENTER): Payer: Managed Care, Other (non HMO) | Admitting: *Deleted

## 2014-07-15 ENCOUNTER — Telehealth: Payer: Self-pay

## 2014-07-15 VITALS — Ht 78.0 in | Wt 212.2 lb

## 2014-07-15 DIAGNOSIS — R1310 Dysphagia, unspecified: Secondary | ICD-10-CM

## 2014-07-15 NOTE — Telephone Encounter (Signed)
Message copied by Marlon Pel on Mon Jul 15, 2014  9:18 AM ------      Message from: Jerene Bears      Created: Mon Jul 15, 2014  9:07 AM       Would separate EGD from colon and let's get the EGD done this week.      Thanks      JMP            ----- Message -----         From: Kellie Moor, RN         Sent: 07/12/2014  11:03 AM           To: Jerene Bears, MD            Received a phone call from radiologist high suspicion for esophageal cancer on BS today.  He has already reviewed with th patient.  They are recommending an earlier EGD.  He is currently scheduled for 08/12/14 for a double.  Only have a singe opening for next week.  Please advise what we need to do       ------

## 2014-07-15 NOTE — Telephone Encounter (Signed)
Patient notified of the results of barium swallow and Dr. Vena Rua recommendations.  He will come for a pre-visit today and EGD 07/17/14 10:00 in the [facility]

## 2014-07-15 NOTE — Telephone Encounter (Signed)
MEDICATION APPROVED   SENT APPROVAL TO BE SCANNED IN

## 2014-07-15 NOTE — Progress Notes (Signed)
Patient denies any allergies to eggs or soy. Patient denies any problems with anesthesia/sedation. Patient denies any oxygen use at home and does not take any diet/weight loss medications. EMMI education assisgned to patient on EGD, this was explained and instructions given to patient. 

## 2014-07-17 ENCOUNTER — Other Ambulatory Visit: Payer: Self-pay

## 2014-07-17 ENCOUNTER — Telehealth: Payer: Self-pay | Admitting: Internal Medicine

## 2014-07-17 ENCOUNTER — Encounter: Payer: Self-pay | Admitting: Internal Medicine

## 2014-07-17 ENCOUNTER — Other Ambulatory Visit (INDEPENDENT_AMBULATORY_CARE_PROVIDER_SITE_OTHER): Payer: Managed Care, Other (non HMO)

## 2014-07-17 ENCOUNTER — Ambulatory Visit (AMBULATORY_SURGERY_CENTER): Payer: Managed Care, Other (non HMO) | Admitting: Internal Medicine

## 2014-07-17 VITALS — BP 138/78 | HR 78 | Temp 96.4°F | Resp 18 | Ht 78.0 in | Wt 212.0 lb

## 2014-07-17 DIAGNOSIS — K2289 Other specified disease of esophagus: Secondary | ICD-10-CM

## 2014-07-17 DIAGNOSIS — R1310 Dysphagia, unspecified: Secondary | ICD-10-CM

## 2014-07-17 DIAGNOSIS — R933 Abnormal findings on diagnostic imaging of other parts of digestive tract: Secondary | ICD-10-CM

## 2014-07-17 DIAGNOSIS — K228 Other specified diseases of esophagus: Secondary | ICD-10-CM

## 2014-07-17 DIAGNOSIS — K229 Disease of esophagus, unspecified: Secondary | ICD-10-CM

## 2014-07-17 LAB — COMPREHENSIVE METABOLIC PANEL
ALK PHOS: 63 U/L (ref 39–117)
ALT: 19 U/L (ref 0–53)
AST: 24 U/L (ref 0–37)
Albumin: 3.4 g/dL — ABNORMAL LOW (ref 3.5–5.2)
BILIRUBIN TOTAL: 1.2 mg/dL (ref 0.2–1.2)
BUN: 8 mg/dL (ref 6–23)
CO2: 28 meq/L (ref 19–32)
CREATININE: 1 mg/dL (ref 0.4–1.5)
Calcium: 8.8 mg/dL (ref 8.4–10.5)
Chloride: 107 mEq/L (ref 96–112)
GFR: 85.47 mL/min (ref >60.00)
GLUCOSE: 94 mg/dL (ref 70–99)
Potassium: 5.1 mEq/L (ref 3.5–5.1)
SODIUM: 138 meq/L (ref 135–145)
TOTAL PROTEIN: 6.7 g/dL (ref 6.0–8.3)

## 2014-07-17 LAB — CBC WITH DIFFERENTIAL/PLATELET
Basophils Absolute: 0 10*3/uL (ref 0.0–0.1)
Basophils Relative: 0.4 % (ref 0.0–3.0)
EOS PCT: 2 % (ref 0.0–5.0)
Eosinophils Absolute: 0.1 10*3/uL (ref 0.0–0.7)
HEMATOCRIT: 38 % — AB (ref 39.0–52.0)
HEMOGLOBIN: 11.7 g/dL — AB (ref 13.0–17.0)
LYMPHS ABS: 1.9 10*3/uL (ref 0.7–4.0)
Lymphocytes Relative: 33.8 % (ref 12.0–46.0)
MCHC: 30.8 g/dL (ref 30.0–36.0)
MCV: 84.2 fl (ref 78.0–100.0)
MONO ABS: 0.6 10*3/uL (ref 0.1–1.0)
MONOS PCT: 10.5 % (ref 3.0–12.0)
NEUTROS ABS: 2.9 10*3/uL (ref 1.4–7.7)
Neutrophils Relative %: 53.3 % (ref 43.0–77.0)
PLATELETS: 223 10*3/uL (ref 150.0–400.0)
RBC: 4.51 Mil/uL (ref 4.22–5.81)
RDW: 16.4 % — AB (ref 11.5–15.5)
WBC: 5.5 10*3/uL (ref 4.0–10.5)

## 2014-07-17 MED ORDER — LANSOPRAZOLE 30 MG PO TBDP: 30.0000 mg | ORAL_TABLET | Freq: Two times a day (BID) | ORAL | Status: DC

## 2014-07-17 MED ORDER — SODIUM CHLORIDE 0.9 % IV SOLN: 500.0000 mL | INTRAVENOUS | Status: DC

## 2014-07-17 NOTE — Progress Notes (Signed)
Path sent RUSH per Dr. Hilarie Fredrickson. maw

## 2014-07-17 NOTE — Telephone Encounter (Signed)
Called sister in law back, she was wanting to know if there was any additional information that we could provide besides what was on the report, pt was wanting her to call and ask. She was wanting to know prognosis. Advised her that I could only tell her what was on the report that she had and that they would have to wait until test results were back, but once those were back they could call back and speak with the nurse downstairs.-adm

## 2014-07-17 NOTE — Op Note (Signed)
[facility]  [address]. [place], [id number]   ENDOSCOPY PROCEDURE REPORT  PATIENT: Willie Owens, Willie Owens  MR#: [id number] BIRTHDATE: 02/04/1951 , 62  yrs. old GENDER: Male ENDOSCOPIST: Jerene Bears, MD PROCEDURE DATE:  07/17/2014 PROCEDURE:  EGD w/ biopsy ASA CLASS:     Class III INDICATIONS:  Dysphagia.   Odynophagia.   history of GERD. Abnormal barium esophagram. MEDICATIONS: MAC sedation, administered by CRNA and propofol (Diprivan) 250mg  IV TOPICAL ANESTHETIC: none  DESCRIPTION OF PROCEDURE: After the risks benefits and alternatives of the procedure were thoroughly explained, informed consent was obtained.  The LB [id number] endoscope was introduced through the mouth and advanced to the second portion of the duodenum. Without limitations.  The instrument was slowly withdrawn as the mucosa was fully examined.      ESOPHAGUS: There was evidence of suspected, long segment, Barrett's esophagus beginning 28 cm from the incisors.   A near circumferential ulcerated, friable and fungating mass was found in the lower third of the esophagus. The top of the esophageal mass is at 35 cm and extends to 40 cm at the GE junction. There is luminal narrowing but the mass is traversable by the standard adult upper endoscope.  Multiple biopsies were performed using cold forceps. Sample sent for histology.   A 5 cm hiatal hernia was noted, with the diaphragmatic hiatus located 45 cm from the incisors.  STOMACH: The mucosa of the stomach appeared normal.  DUODENUM: The duodenal mucosa showed no abnormalities in the bulb and second portion of the duodenum.  Retroflexed views revealed a hiatal hernia.     The scope was then withdrawn from the patient and the procedure completed.  COMPLICATIONS: There were no complications.  ENDOSCOPIC IMPRESSION: 1.   There was evidence of suspected Barrett's esophagus 2.   Near circumferential mass was found in the lower third  of the esophagus; multiple biopsies 3.   5 cm hiatal hernia 4.   The mucosa of the stomach appeared normal 5.   The duodenal mucosa showed no abnormalities in the bulb and second portion of the duodenum  RECOMMENDATIONS: 1.  Await biopsy results 2.  CT scan of the chest abdomen and pelvis 3.  Oncology referral 4.  Twice daily liquid PPI 5.  Soft diet  eSigned:  Jerene Bears, MD 07/17/2014 10:28 AM   CC:The Patient, Anastasia Pall, MD, and Keitha Butte, MD  PATIENT NAME:  Willie Owens, Willie Owens MR#: [id number]

## 2014-07-17 NOTE — Progress Notes (Signed)
Called to room to assist during endoscopic procedure.  Patient ID and intended procedure confirmed with present staff. Received instructions for my participation in the procedure from the performing physician.  

## 2014-07-17 NOTE — Progress Notes (Signed)
A/ox3, pleased with MAC, report to RN 

## 2014-07-17 NOTE — Patient Instructions (Addendum)
YOU HAD AN ENDOSCOPIC PROCEDURE TODAY AT [facility]: Refer to the procedure report that was given to you for any specific questions about what was found during the examination.  If the procedure report does not answer your questions, please call your gastroenterologist to clarify.  If you requested that your care partner not be given the details of your procedure findings, then the procedure report has been included in a sealed envelope for you to review at your convenience later.  YOU SHOULD EXPECT: Some feelings of bloating in the abdomen. Passage of more gas than usual.  Walking can help get rid of the air that was put into your GI tract during the procedure and reduce the bloating. If you had a lower endoscopy (such as a colonoscopy or flexible sigmoidoscopy) you may notice spotting of blood in your stool or on the toilet paper. If you underwent a bowel prep for your procedure, then you may not have a normal bowel movement for a few days.  DIET: Your first meal following the procedure should be a light meal and then it is ok to progress to your normal diet.  A half-sandwich or bowl of soup is an example of a good first meal.  Heavy or fried foods are harder to digest and may make you feel nauseous or bloated.  Likewise meals heavy in dairy and vegetables can cause extra gas to form and this can also increase the bloating.  Drink plenty of fluids but you should avoid alcoholic beverages for 24 hours.  ACTIVITY: Your care partner should take you home directly after the procedure.  You should plan to take it easy, moving slowly for the rest of the day.  You can resume normal activity the day after the procedure however you should NOT DRIVE or use heavy machinery for 24 hours (because of the sedation medicines used during the test).    SYMPTOMS TO REPORT IMMEDIATELY: A gastroenterologist can be reached at any hour.  During normal business hours, 8:30 AM to 5:00 PM Monday through Friday,  call [phone].  After hours and on weekends, please call the GI answering service at [phone] who will take a message and have the physician on call contact you.     Following upper endoscopy (EGD)  Vomiting of blood or coffee ground material  New chest pain or pain under the shoulder blades  Painful or persistently difficult swallowing  New shortness of breath  Fever of 100F or higher  Black, tarry-looking stools  FOLLOW UP: If any biopsies were taken you will be contacted by phone or by letter within the next 1-3 weeks.  Call your gastroenterologist if you have not heard about the biopsies in 3 weeks.  Our staff will call the home number listed on your records the next business day following your procedure to check on you and address any questions or concerns that you may have at that time regarding the information given to you following your procedure. This is a courtesy call and so if there is no answer at the home number and we have not heard from you through the emergency physician on call, we will assume that you have returned to your regular daily activities without incident.  SIGNATURES/CONFIDENTIALITY: You and/or your care partner have signed paperwork which will be entered into your electronic medical record.  These signatures attest to the fact that that the information above on your After Visit Summary has been reviewed and is understood.    Full responsibility of the confidentiality of this discharge information lies with you and/or your care-partner.   iNFORMATION ON BARRETTS ESOPHAGUS GIVEN TO YOU TODAY  CT SCAN OF ABD AND PELVIS AND CHEST TO BE ORDERED   ONCOLOGY REFERRAL  TWICE DAILY LIQUID ANTI REFLUX MEDICATION -PICK UP AT [place] ON SOFT DIET  TO LAB ON D/C TODAY

## 2014-07-18 ENCOUNTER — Other Ambulatory Visit: Payer: Managed Care, Other (non HMO) | Admitting: Lab

## 2014-07-18 ENCOUNTER — Telehealth: Payer: Self-pay | Admitting: *Deleted

## 2014-07-18 ENCOUNTER — Ambulatory Visit: Payer: Managed Care, Other (non HMO)

## 2014-07-18 ENCOUNTER — Ambulatory Visit (HOSPITAL_BASED_OUTPATIENT_CLINIC_OR_DEPARTMENT_OTHER): Payer: Managed Care, Other (non HMO) | Admitting: Family

## 2014-07-18 ENCOUNTER — Encounter: Payer: Self-pay | Admitting: Hematology & Oncology

## 2014-07-18 ENCOUNTER — Telehealth: Payer: Self-pay | Admitting: Internal Medicine

## 2014-07-18 VITALS — BP 132/64 | HR 63 | Temp 98.0°F | Resp 18 | Ht 76.0 in | Wt 209.0 lb

## 2014-07-18 DIAGNOSIS — C155 Malignant neoplasm of lower third of esophagus: Secondary | ICD-10-CM

## 2014-07-18 NOTE — Telephone Encounter (Signed)
Message left

## 2014-07-18 NOTE — Telephone Encounter (Signed)
I called and left a message, pt's wife called me back I told them pathology was diagnostic of esophageal adenocarcinoma.  They have already met with Dr. Marin Olp this pm Pt has CT chest tomorrow and PET scan next week If no evidence of metastatic disease will pursue EUS Eventual CT surgery consult

## 2014-07-19 ENCOUNTER — Encounter: Payer: Self-pay | Admitting: Internal Medicine

## 2014-07-19 ENCOUNTER — Encounter (HOSPITAL_COMMUNITY): Payer: Self-pay

## 2014-07-19 ENCOUNTER — Ambulatory Visit (HOSPITAL_COMMUNITY)
Admission: RE | Admit: 2014-07-19 | Discharge: 2014-07-19 | Disposition: A | Discharge status: Home / Self Care | Payer: Managed Care, Other (non HMO) | Source: Ambulatory Visit | Attending: Internal Medicine | Admitting: Internal Medicine

## 2014-07-19 DIAGNOSIS — N281 Cyst of kidney, acquired: Secondary | ICD-10-CM | POA: Insufficient documentation

## 2014-07-19 DIAGNOSIS — K228 Other specified diseases of esophagus: Secondary | ICD-10-CM

## 2014-07-19 DIAGNOSIS — C159 Malignant neoplasm of esophagus, unspecified: Secondary | ICD-10-CM | POA: Insufficient documentation

## 2014-07-19 DIAGNOSIS — R599 Enlarged lymph nodes, unspecified: Secondary | ICD-10-CM | POA: Insufficient documentation

## 2014-07-19 DIAGNOSIS — K2289 Other specified disease of esophagus: Secondary | ICD-10-CM

## 2014-07-19 HISTORY — DX: Malignant (primary) neoplasm, unspecified: C80.1

## 2014-07-19 MED ORDER — IOHEXOL 300 MG/ML  SOLN
100.0000 mL | Freq: Once | INTRAMUSCULAR | Status: AC | PRN
  Administered 2014-07-19: 100 mL via INTRAVENOUS

## 2014-07-19 NOTE — Progress Notes (Signed)
Referral MD  Reason for Referral: Esophageal mass   Chief Complaint  Patient presents with  . NEW PATIENT  : I have a tumor my esophagus  HPI: Willie Owens is a very nice 63 year old gentleman. He is being. Has a history of atrial fibrillation. He has had cardioversion. He is on no anticoagulation.. He has had some progressive dysphasia. This would go on for a couple months. He has had no pain with swallowing. He's had maybe a couple of  vomiting.  There's been no bleeding. He's had no change in bowel or bladder habits. There is some slight epigastric discomfort. There has had a long history of dyspepsia with reflux.  He subsequently had an upper endoscopy done. There was noted to be a friable mass extending from 35 cm to 40 cm in the lower esophagus. This extended to the GE junction.  Biopsies were taken. The pathology report ([id number]) showed adenocarcinoma.  He is set up for CT scans to be done tomorrow.  He had lab work done. This was done on the 22nd. Labs looked good. He was minimally anemic. His albumin was 3.4. Had normal liver function studies.  Again, his really not lost weight. He still able to swallow soft foods. He's had no difficulties working.  Overall, his performance status is ECOG 0   Past Medical History  Diagnosis Date  . Atrial fibrillation     with RVR while hospitalized in 2014  . Hyperthyroidism     thyrotoxicosis , graves  . H/O [facility] spotted fever   . Gout   . History of antinuclear antibodies   . GERD (gastroesophageal reflux disease)   :  Past Surgical History  Procedure Laterality Date  . Leg surgery      Fx, rod removed  . Transthoracic echocardiogram  4/292014    EF 55-60%; severe bi-atrial enlargement; RV mod dilated  . Cardioversion N/A 09/21/2013    Procedure: CARDIOVERSION;  Surgeon: Pixie Casino, MD;  Location: [facility] ENDOSCOPY;  Service: Cardiovascular;  Laterality: N/A;  . Tonsillectomy and adenoidectomy    :  Current  outpatient prescriptions:aspirin 81 MG tablet, Take 81 mg by mouth daily., Disp: , Rfl: ;  B Complex-C (SUPER B COMPLEX PO), Take 1 tablet by mouth daily., Disp: , Rfl: ;  Cholecalciferol (VITAMIN D) 1000 UNITS capsule, Take 1,000 Units by mouth daily., Disp: , Rfl: ;  esomeprazole (NEXIUM) 40 MG capsule, Take 1 capsule (40 mg total) by mouth 2 (two) times daily before a meal., Disp: 60 capsule, Rfl: 6 methimazole (TAPAZOLE) 5 MG tablet, Take 2.5 mg by mouth daily. TAKES 1/2 TAB = 2.5, Disp: , Rfl: ;  propranolol ER (INDERAL LA) 80 MG 24 hr capsule, TAKE 1 CAPSULE BY MOUTH EVERY DAY, Disp: 30 capsule, Rfl: 7;  sucralfate (CARAFATE) 1 GM/10ML suspension, Take 10 mLs (1 g total) by mouth 4 (four) times daily -  with meals and at bedtime., Disp: 420 mL, Rfl: 2 lansoprazole (PREVACID SOLUTAB) 30 MG disintegrating tablet, Take 1 tablet (30 mg total) by mouth 2 (two) times daily., Disp: 60 tablet, Rfl: 6:  :  Allergies  Allergen Reactions  . Penicillins Other (See Comments)    Hallucinations, from childhood  :  Family History  Problem Relation Age of Onset  . Pulmonary embolism Neg Hx   . CAD Neg Hx   . Colon cancer Neg Hx   . Stomach cancer Neg Hx   . Esophageal cancer Neg Hx   . Diabetes Mother   :  History   Social History  . Marital Status: Married    Spouse Name: Willie Owens    Number of Children: 3  . Years of Education: N/A   Occupational History  . sales    Social History Main Topics  . Smoking status: Former Smoker -- 2.00 packs/day for 34 years    Types: Cigarettes    Start date: 03/19/1971    Quit date: 12/30/2004  . Smokeless tobacco: Never Used     Comment: quit smoking 9 years ago  . Alcohol Use: 1.5 oz/week    3 drink(s) per week     Comment: vodka daily  . Drug Use: No  . Sexual Activity: Not on file   Other Topics Concern  . Not on file   Social History Narrative  . No narrative on file  :  Pertinent items are noted in HPI.  Exam: @IPVITALS @   well-developed and well-nourished white woman in no obvious distress. Vital signs are temperature of 98. Pulse 63. Blood pressure 132/64. Weight is 209 pounds. Head and neck exam shows a normocephalic atraumatic so. There are no ocular or oral lesion. There are no palpable cervical or supraclavicular lymph nodes. Lungs are clear bilateral. His arouses rhonchi. Cardiac exam regular in room with a normal S1 and S2. There are no murmurs rubs or bruits. Abdomen is soft. Has good bowel sounds. There is no fluid wave. There is no palpable liver or spleen tip. Neck exam shows no tenderness over the spine ribs or hips. Extremities shows no clubbing cyanosis or edema. Neurological exam shows no focal neurological deficit. Axillary exam shows no focal axillary adenopathy.    Recent Labs  07/17/14 1113  WBC 5.5  HGB 11.7*  HCT 38.0*  PLT 223.0    Recent Labs  07/17/14 1113  NA 138  K 5.1  CL 107  CO2 28  GLUCOSE 94  BUN 8  CREATININE 1.0  CALCIUM 8.8    Blood smear review: No data  Pathology: See above     Assessment and Plan: Willie Owens is a 63 year old gentleman. He's had a history of reflux. He has Barrett's esophagus. He now has adenocarcinoma of the lower esophagus.  He came in with his wife. We spent a good hour with him.  The whole focus now is staging. We need to see what his CT scan shows. We also need to see what a PET scan shows.  If there is no obvious metastatic disease, then we may want to consider an endoscopic ultrasound to see about  Esophageal lymph nodes.  He has a good performance status. As such, we can clearly be aggressive.  If he has no metastatic disease, I would recommend neoadjuvant chemotherapy and radiation therapy. I think that he wouldn't be a great candidate for this. If he has a good response, then I would consider an esophagectomy.  For now, we will see what his radiological studies show.  We will be in touch with him once we get the results  back.  Again, we spent over an hour with him. He is really nice. I take care of his mother-in-law who I have known for probably 9 or 13 years.

## 2014-07-19 NOTE — Progress Notes (Signed)
We have gotten prior authorization for patient's Prevacid from 07/18/14-07/19/15 from [place]. PA# is AETNA [id number].

## 2014-07-22 ENCOUNTER — Encounter: Payer: Self-pay | Admitting: Internal Medicine

## 2014-07-22 ENCOUNTER — Telehealth: Payer: Self-pay | Admitting: Internal Medicine

## 2014-07-22 NOTE — Telephone Encounter (Signed)
CT scan reviewed by Dr. Ardis Hughs and Dr. Carlean Purl.  "enlarged lymph nodes in abdomen may be related to esophageal mass.  PET scan on 07/24/14 will give [place] more of an answer.  Wife notified.  She is aware that Oncology or our office will call with the next steps.

## 2014-07-22 NOTE — Telephone Encounter (Signed)
Dr. Carlean Purl please look at the results of the CT scan and advise what I need to tell the patient.  Dr. Hilarie Fredrickson is out of the office all week and you are MD of the day

## 2014-07-24 ENCOUNTER — Encounter (HOSPITAL_COMMUNITY): Payer: Self-pay

## 2014-07-24 ENCOUNTER — Ambulatory Visit (HOSPITAL_COMMUNITY)
Admission: RE | Admit: 2014-07-24 | Discharge: 2014-07-24 | Disposition: A | Discharge status: Home / Self Care | Payer: Managed Care, Other (non HMO) | Source: Ambulatory Visit | Attending: Hematology & Oncology | Admitting: Hematology & Oncology

## 2014-07-24 DIAGNOSIS — R935 Abnormal findings on diagnostic imaging of other abdominal regions, including retroperitoneum: Secondary | ICD-10-CM | POA: Insufficient documentation

## 2014-07-24 DIAGNOSIS — C155 Malignant neoplasm of lower third of esophagus: Secondary | ICD-10-CM | POA: Insufficient documentation

## 2014-07-24 DIAGNOSIS — I2510 Atherosclerotic heart disease of native coronary artery without angina pectoris: Secondary | ICD-10-CM | POA: Insufficient documentation

## 2014-07-24 LAB — GLUCOSE, CAPILLARY: GLUCOSE-CAPILLARY: 100 mg/dL — AB (ref 70–99)

## 2014-07-24 MED ORDER — FLUDEOXYGLUCOSE F - 18 (FDG) INJECTION
10.0000 | Freq: Once | INTRAVENOUS | Status: AC | PRN
  Administered 2014-07-24: 10 via INTRAVENOUS

## 2014-07-25 ENCOUNTER — Encounter: Payer: Self-pay | Admitting: Hematology & Oncology

## 2014-07-25 ENCOUNTER — Ambulatory Visit (HOSPITAL_BASED_OUTPATIENT_CLINIC_OR_DEPARTMENT_OTHER): Payer: Managed Care, Other (non HMO) | Admitting: Hematology & Oncology

## 2014-07-25 VITALS — BP 126/71 | HR 71 | Temp 97.6°F | Resp 20 | Ht 72.0 in | Wt 209.0 lb

## 2014-07-25 DIAGNOSIS — R599 Enlarged lymph nodes, unspecified: Secondary | ICD-10-CM

## 2014-07-25 DIAGNOSIS — C159 Malignant neoplasm of esophagus, unspecified: Secondary | ICD-10-CM

## 2014-07-25 DIAGNOSIS — C155 Malignant neoplasm of lower third of esophagus: Secondary | ICD-10-CM

## 2014-07-26 ENCOUNTER — Encounter: Payer: Self-pay | Admitting: Hematology & Oncology

## 2014-07-26 ENCOUNTER — Telehealth: Payer: Self-pay | Admitting: Hematology & Oncology

## 2014-07-26 DIAGNOSIS — C159 Malignant neoplasm of esophagus, unspecified: Secondary | ICD-10-CM

## 2014-07-26 HISTORY — DX: Malignant neoplasm of esophagus, unspecified: C15.9

## 2014-07-26 MED ORDER — CAPECITABINE 500 MG PO TABS: 625.0000 mg/m2 | ORAL_TABLET | Freq: Two times a day (BID) | ORAL | Status: DC

## 2014-07-26 NOTE — Addendum Note (Signed)
Addended by: Burney Gauze R on: 07/26/2014 03:40 PM   Modules accepted: Orders

## 2014-07-26 NOTE — Progress Notes (Signed)
Hematology and Oncology Follow Up Visit  Willie Owens [id number] 04/05/1951 63 y.o. 07/26/2014   Principle Diagnosis:   Metastatic adenocarcinoma of the distal esophagus  Current Therapy:    Observation     Interim History:  Mr.  Ciavarella is back for followup. Unfortunately we have found that he has metastatic disease. He did get the biopsy results back. This is from his upper endoscopy. The biopsy report ([id number]) showed this to be adenocarcinoma. I did have the pathologist send off the specimen for HER-2 testing.  We did CT scans and PET scan. The CT and PET scan showed that he had a retroperitoneal lymphadenopathy. This, I believe, makes him inoperable in place him at stage IV.  He is still doing okay. He is working. He's had some dysphagia. He is able to take in solids and soft food. There is no pain.  Overall, his performance status is ECOG 1  Medications: Current outpatient prescriptions:aspirin 81 MG tablet, Take 81 mg by mouth daily., Disp: , Rfl: ;  B Complex-C (SUPER B COMPLEX PO), Take 1 tablet by mouth daily., Disp: , Rfl: ;  Cholecalciferol (VITAMIN D) 1000 UNITS capsule, Take 1,000 Units by mouth daily., Disp: , Rfl: ;  esomeprazole (NEXIUM) 40 MG capsule, Take 1 capsule (40 mg total) by mouth 2 (two) times daily before a meal., Disp: 60 capsule, Rfl: 6 methimazole (TAPAZOLE) 5 MG tablet, Take 2.5 mg by mouth daily. TAKES 1/2 TAB = 2.5, Disp: , Rfl: ;  propranolol ER (INDERAL LA) 80 MG 24 hr capsule, TAKE 1 CAPSULE BY MOUTH EVERY DAY, Disp: 30 capsule, Rfl: 7;  lansoprazole (PREVACID SOLUTAB) 30 MG disintegrating tablet, Take 1 tablet (30 mg total) by mouth 2 (two) times daily., Disp: 60 tablet, Rfl: 6;  MOVIPREP 100 G SOLR, , Disp: , Rfl:  sucralfate (CARAFATE) 1 GM/10ML suspension, Take 10 mLs (1 g total) by mouth 4 (four) times daily -  with meals and at bedtime., Disp: 420 mL, Rfl: 2  Allergies:  Allergies  Allergen Reactions  . Penicillins Other (See Comments)     Hallucinations, from childhood    Past Medical History, Surgical history, Social history, and Family History were reviewed and updated.  Review of Systems: As above  Physical Exam:  height is 6' (1.829 m) and weight is 209 lb (94.802 kg). His oral temperature is 97.6 F (36.4 C). His blood pressure is 126/71 and his pulse is 71. His respiration is 20.   Portable and well-nourished white. Lungs are clear. Cardiac exam regular in rhythm. Abdomen is soft. He is good bowel sounds. There is no fluid wave. There is no palpable liver or spleen tip. Back exam showed no tenderness over the spine. Extremities shows no tenderness over the spine ribs or hips. Neurological exam shows no focal neurological deficits.  Lab Results  Component Value Date   WBC 5.5 07/17/2014   HGB 11.7* 07/17/2014   HCT 38.0* 07/17/2014   MCV 84.2 07/17/2014   PLT 223.0 07/17/2014     Chemistry      Component Value Date/Time   NA 138 07/17/2014 1113   K 5.1 07/17/2014 1113   CL 107 07/17/2014 1113   CO2 28 07/17/2014 1113   BUN 8 07/17/2014 1113   CREATININE 1.0 07/17/2014 1113   CREATININE 1.05 09/17/2013 1518      Component Value Date/Time   CALCIUM 8.8 07/17/2014 1113   ALKPHOS 63 07/17/2014 1113   AST 24 07/17/2014 1113   ALT 19  07/17/2014 1113   BILITOT 1.2 07/17/2014 1113         Impression and Plan: Mr. Sleight is 68 year old children. He has metastatic adenocarcinoma of the esophagus. He still has a very good performance status.  I had a long talk with he and his wife. I showed him the PET scan results. I explained to him why he was not a surgical candidate.  I told them that we can still treat this cancer. We, unfortunately cannot sure this. They have a very strong faith. They understand that this is God's will. He will be a "vessel" for healing.  I think we have to get an MRI of his brain to make sure that there is nothing going on up in the brain. Also they would have to get a echocardiogram on  him.  He will need to have a Port-A-Cath.  I believe that chemotherapy with EOX regimen would be appropriate. I think this would be fairly well tolerated. I the response rates should be over 60%. He has disease confined to lymph nodes so I think that he should be one of the patient's to response better.  It will be very interested to see what his HER-2 status is. Hopefully will have these results back next week.  I spent about an hour with him today. Again, I went over his scans. I went over the side effects of chemotherapy. I told him about low white cells, possible increased infection, bleeding, skin changes from the Xeloda. I told him he probably would lose his hair. I said this probably would come out in about 2 weeks. I told him about mouth sores. He understands all this. He agrees to proceed.  We will get started next week.  After 2 cycles of treatment, we will rescan him.  I will get him back for the start of his second cycle of treatment.  [name], MD 7/31/20156:09 AM

## 2014-07-26 NOTE — Telephone Encounter (Signed)
Pt aware of 8-1 MRI at med center, 8-3 echo and chemo edu at [facility], 8-4 port to be NPO after midnight, that he needs a driver and 8-6 tx

## 2014-07-26 NOTE — Telephone Encounter (Signed)
AETNA - NPR  J2405 ondansetron (ZOFRAN) 8 MG tablet J1100 dexamethasone (DECADRON) 4 MG tablet J2060  LORazepam (ATIVAN) 0.5 MG tablet  J9178 epirubicin (ELLENCE) chemo injection 110 mg J9263 oxaliplatin (ELOXATIN) 285 mg in dextrose 5 % 500 mL chemo infusion J0780 prochlorperazine (COMPAZINE) 10 MG tablet  Esophageal cancer - Primary 150.9 I spoke w Eddie Dibbles on today. Efc: 04/26/2014 Ref: [id number]

## 2014-07-27 ENCOUNTER — Ambulatory Visit (HOSPITAL_BASED_OUTPATIENT_CLINIC_OR_DEPARTMENT_OTHER)
Admission: RE | Admit: 2014-07-27 | Discharge: 2014-07-27 | Disposition: A | Discharge status: Home / Self Care | Payer: Managed Care, Other (non HMO) | Source: Ambulatory Visit | Attending: Hematology & Oncology | Admitting: Hematology & Oncology

## 2014-07-27 DIAGNOSIS — C159 Malignant neoplasm of esophagus, unspecified: Secondary | ICD-10-CM | POA: Insufficient documentation

## 2014-07-27 MED ORDER — GADOBENATE DIMEGLUMINE 529 MG/ML IV SOLN: 20.0000 mL | Freq: Once | INTRAVENOUS | Status: AC | PRN

## 2014-07-28 ENCOUNTER — Other Ambulatory Visit: Payer: Self-pay | Admitting: Radiology

## 2014-07-29 ENCOUNTER — Ambulatory Visit (HOSPITAL_COMMUNITY)
Admission: RE | Admit: 2014-07-29 | Discharge: 2014-07-29 | Disposition: A | Discharge status: Home / Self Care | Payer: Managed Care, Other (non HMO) | Source: Ambulatory Visit | Attending: Hematology & Oncology | Admitting: Hematology & Oncology

## 2014-07-29 ENCOUNTER — Encounter (HOSPITAL_COMMUNITY): Payer: Self-pay | Admitting: Pharmacy Technician

## 2014-07-29 ENCOUNTER — Other Ambulatory Visit: Payer: Self-pay | Admitting: Radiology

## 2014-07-29 ENCOUNTER — Other Ambulatory Visit: Payer: Managed Care, Other (non HMO)

## 2014-07-29 DIAGNOSIS — I359 Nonrheumatic aortic valve disorder, unspecified: Secondary | ICD-10-CM | POA: Insufficient documentation

## 2014-07-29 DIAGNOSIS — C159 Malignant neoplasm of esophagus, unspecified: Secondary | ICD-10-CM | POA: Insufficient documentation

## 2014-07-29 NOTE — Progress Notes (Signed)
Echocardiogram 2D Echocardiogram has been performed.  Willie Owens 07/29/2014, 10:01 AM

## 2014-07-30 ENCOUNTER — Encounter (HOSPITAL_COMMUNITY): Payer: Self-pay

## 2014-07-30 ENCOUNTER — Other Ambulatory Visit: Payer: Self-pay | Admitting: Hematology & Oncology

## 2014-07-30 ENCOUNTER — Ambulatory Visit (HOSPITAL_COMMUNITY)
Admission: RE | Admit: 2014-07-30 | Discharge: 2014-07-30 | Disposition: A | Discharge status: Home / Self Care | Payer: Managed Care, Other (non HMO) | Source: Ambulatory Visit | Attending: Hematology & Oncology | Admitting: Hematology & Oncology

## 2014-07-30 ENCOUNTER — Telehealth: Payer: Self-pay | Admitting: *Deleted

## 2014-07-30 DIAGNOSIS — E0590 Thyrotoxicosis, unspecified without thyrotoxic crisis or storm: Secondary | ICD-10-CM | POA: Insufficient documentation

## 2014-07-30 DIAGNOSIS — C159 Malignant neoplasm of esophagus, unspecified: Secondary | ICD-10-CM

## 2014-07-30 DIAGNOSIS — I4891 Unspecified atrial fibrillation: Secondary | ICD-10-CM | POA: Insufficient documentation

## 2014-07-30 DIAGNOSIS — K219 Gastro-esophageal reflux disease without esophagitis: Secondary | ICD-10-CM | POA: Insufficient documentation

## 2014-07-30 DIAGNOSIS — M109 Gout, unspecified: Secondary | ICD-10-CM | POA: Insufficient documentation

## 2014-07-30 DIAGNOSIS — Z87891 Personal history of nicotine dependence: Secondary | ICD-10-CM | POA: Insufficient documentation

## 2014-07-30 DIAGNOSIS — Z79899 Other long term (current) drug therapy: Secondary | ICD-10-CM | POA: Insufficient documentation

## 2014-07-30 DIAGNOSIS — E0500 Thyrotoxicosis with diffuse goiter without thyrotoxic crisis or storm: Secondary | ICD-10-CM | POA: Insufficient documentation

## 2014-07-30 DIAGNOSIS — Z7982 Long term (current) use of aspirin: Secondary | ICD-10-CM | POA: Insufficient documentation

## 2014-07-30 LAB — CBC WITH DIFFERENTIAL/PLATELET
Basophils Absolute: 0 10*3/uL (ref 0.0–0.1)
Basophils Relative: 1 % (ref 0–1)
EOS ABS: 0.2 10*3/uL (ref 0.0–0.7)
Eosinophils Relative: 4 % (ref 0–5)
HCT: 37.8 % — ABNORMAL LOW (ref 39.0–52.0)
Hemoglobin: 11.7 g/dL — ABNORMAL LOW (ref 13.0–17.0)
LYMPHS ABS: 1.9 10*3/uL (ref 0.7–4.0)
LYMPHS PCT: 42 % (ref 12–46)
MCH: 25.3 pg — ABNORMAL LOW (ref 26.0–34.0)
MCHC: 31 g/dL (ref 30.0–36.0)
MCV: 81.6 fL (ref 78.0–100.0)
MONOS PCT: 10 % (ref 3–12)
Monocytes Absolute: 0.4 10*3/uL (ref 0.1–1.0)
NEUTROS ABS: 1.9 10*3/uL (ref 1.7–7.7)
NEUTROS PCT: 43 % (ref 43–77)
PLATELETS: 207 10*3/uL (ref 150–400)
RBC: 4.63 MIL/uL (ref 4.22–5.81)
RDW: 15.5 % (ref 11.5–15.5)
WBC: 4.4 10*3/uL (ref 4.0–10.5)

## 2014-07-30 LAB — PROTIME-INR
INR: 0.99 (ref 0.00–1.49)
PROTHROMBIN TIME: 13.1 s (ref 11.6–15.2)

## 2014-07-30 MED ORDER — SODIUM CHLORIDE 0.9 % IV SOLN
INTRAVENOUS | Status: DC
  Administered 2014-07-30: 08:00:00 via INTRAVENOUS

## 2014-07-30 MED ORDER — LIDOCAINE HCL 1 % IJ SOLN
INTRAMUSCULAR | Status: AC
  Filled 2014-07-30: qty 20

## 2014-07-30 MED ORDER — FENTANYL CITRATE 0.05 MG/ML IJ SOLN
INTRAMUSCULAR | Status: AC | PRN
  Administered 2014-07-30 (×2): 50 ug via INTRAVENOUS

## 2014-07-30 MED ORDER — HEPARIN SOD (PORK) LOCK FLUSH 100 UNIT/ML IV SOLN
INTRAVENOUS | Status: AC | PRN
  Administered 2014-07-30: 500 [IU]

## 2014-07-30 MED ORDER — HEPARIN SOD (PORK) LOCK FLUSH 100 UNIT/ML IV SOLN
INTRAVENOUS | Status: AC
  Filled 2014-07-30: qty 5

## 2014-07-30 MED ORDER — FENTANYL CITRATE 0.05 MG/ML IJ SOLN
INTRAMUSCULAR | Status: AC
  Filled 2014-07-30: qty 6

## 2014-07-30 MED ORDER — MIDAZOLAM HCL 2 MG/2ML IJ SOLN
INTRAMUSCULAR | Status: AC | PRN
  Administered 2014-07-30 (×3): 1 mg via INTRAVENOUS

## 2014-07-30 MED ORDER — VANCOMYCIN HCL IN DEXTROSE 1-5 GM/200ML-% IV SOLN
1000.0000 mg | Freq: Once | INTRAVENOUS | Status: AC
  Administered 2014-07-30: 1000 mg via INTRAVENOUS
  Filled 2014-07-30: qty 200

## 2014-07-30 MED ORDER — MIDAZOLAM HCL 2 MG/2ML IJ SOLN
INTRAMUSCULAR | Status: AC
  Filled 2014-07-30: qty 6

## 2014-07-30 NOTE — Telephone Encounter (Addendum)
Message copied by Lenn Sink on Tue Jul 30, 2014  9:39 AM ------      Message from: [name]      Created: Tue Jul 30, 2014  7:53 AM       Call - heart is pumping like a champion!!  Willie Owens ------Left voicemail informing pt that heart is pumping like a champion and his MRI is negative for any brain tumors.

## 2014-07-30 NOTE — Procedures (Signed)
R IJ Port cathter placement with US and fluoroscopy No complication No blood loss. See complete dictation in [facility] PACS.  

## 2014-07-30 NOTE — H&P (Signed)
Willie Owens is an 63 y.o. male.   Chief Complaint: "I'm getting a port a cath" HPI: Patient with stage IV esophageal carcinoma presents today for port a cath placement for chemotherapy.  Past Medical History  Diagnosis Date  . Atrial fibrillation     with RVR while hospitalized in 2014  . Hyperthyroidism     thyrotoxicosis , graves  . H/O [facility] spotted fever   . Gout   . History of antinuclear antibodies   . GERD (gastroesophageal reflux disease)   . Cancer 06/2014    esopageal ca-7/15  . Esophageal cancer 07/26/2014    Past Surgical History  Procedure Laterality Date  . Leg surgery      Fx, rod removed  . Transthoracic echocardiogram  4/292014    EF 55-60%; severe bi-atrial enlargement; RV mod dilated  . Cardioversion N/A 09/21/2013    Procedure: CARDIOVERSION;  Surgeon: Pixie Casino, MD;  Location: [facility] ENDOSCOPY;  Service: Cardiovascular;  Laterality: N/A;  . Tonsillectomy and adenoidectomy      Family History  Problem Relation Age of Onset  . Pulmonary embolism Neg Hx   . CAD Neg Hx   . Colon cancer Neg Hx   . Stomach cancer Neg Hx   . Esophageal cancer Neg Hx   . Diabetes Mother    Social History:  reports that he quit smoking about 9 years ago. His smoking use included Cigarettes. He started smoking about 43 years ago. He has a 68 pack-year smoking history. He has never used smokeless tobacco. He reports that he drinks about 1.5 ounces of alcohol per week. He reports that he does not use illicit drugs.  Allergies:  Allergies  Allergen Reactions  . Penicillins Other (See Comments)    Hallucinations, from childhood    Current outpatient prescriptions:aspirin 81 MG tablet, Take 81 mg by mouth daily., Disp: , Rfl: ;  B Complex-C (SUPER B COMPLEX PO), Take 1 tablet by mouth daily., Disp: , Rfl: ;  capecitabine (XELODA) 500 MG tablet, Take 1,500 mg by mouth 2 (two) times daily after a meal., Disp: , Rfl: ;  Cholecalciferol (VITAMIN D) 2000 UNITS tablet,  Take 2,000 Units by mouth daily., Disp: , Rfl:  esomeprazole (NEXIUM) 40 MG capsule, Take 40 mg by mouth 2 (two) times daily before a meal., Disp: , Rfl: ;  methimazole (TAPAZOLE) 5 MG tablet, Take 2.5 mg by mouth daily. TAKES 1/2 TAB = 2.5, Disp: , Rfl: ;  propranolol ER (INDERAL LA) 80 MG 24 hr capsule, Take 80 mg by mouth at bedtime., Disp: , Rfl: ;  sucralfate (CARAFATE) 1 GM/10ML suspension, Take 1 g by mouth 4 (four) times daily -  with meals and at bedtime., Disp: , Rfl:  lansoprazole (PREVACID SOLUTAB) 30 MG disintegrating tablet, Take 30 mg by mouth 2 (two) times daily., Disp: , Rfl:  Current facility-administered medications:0.9 %  sodium chloride infusion, , Intravenous, Continuous, Hedy Jacob, PA-C, Last Rate: 50 mL/hr at 07/30/14 0746;  vancomycin (VANCOCIN) IVPB 1000 mg/200 mL premix, 1,000 mg, Intravenous, Once, Hedy Jacob, PA-C   Results for orders placed during the hospital encounter of 07/30/14 (from the past 48 hour(s))  CBC WITH DIFFERENTIAL     Status: Abnormal   Collection Time    07/30/14  7:45 AM      Result Value Ref Range   WBC 4.4  4.0 - 10.5 K/uL   RBC 4.63  4.22 - 5.81 MIL/uL   Hemoglobin 11.7 (*) 13.0 -  17.0 g/dL   HCT 37.8 (*) 39.0 - 52.0 %   MCV 81.6  78.0 - 100.0 fL   MCH 25.3 (*) 26.0 - 34.0 pg   MCHC 31.0  30.0 - 36.0 g/dL   RDW 15.5  11.5 - 15.5 %   Platelets 207  150 - 400 K/uL   Neutrophils Relative % 43  43 - 77 %   Neutro Abs 1.9  1.7 - 7.7 K/uL   Lymphocytes Relative 42  12 - 46 %   Lymphs Abs 1.9  0.7 - 4.0 K/uL   Monocytes Relative 10  3 - 12 %   Monocytes Absolute 0.4  0.1 - 1.0 K/uL   Eosinophils Relative 4  0 - 5 %   Eosinophils Absolute 0.2  0.0 - 0.7 K/uL   Basophils Relative 1  0 - 1 %   Basophils Absolute 0.0  0.0 - 0.1 K/uL  PROTIME-INR     Status: None   Collection Time    07/30/14  7:45 AM      Result Value Ref Range   Prothrombin Time 13.1  11.6 - 15.2 seconds   INR 0.99  0.00 - 1.49   No results found.  Review of  Systems  Constitutional: Negative for fever and chills.  Respiratory: Negative for cough, hemoptysis and shortness of breath.   Cardiovascular: Negative for chest pain.  Gastrointestinal: Negative for nausea, vomiting, abdominal pain and blood in stool.       Dysphagia  Genitourinary: Negative for dysuria and hematuria.  Musculoskeletal: Negative for back pain.  Neurological: Negative for headaches.  Endo/Heme/Allergies: Does not bruise/bleed easily.    Blood pressure 133/61, pulse 62, temperature 97.5 F (36.4 C), temperature source Oral, resp. rate 18, SpO2 100.00%. Physical Exam  Constitutional: He is oriented to person, place, and time. He appears well-developed and well-nourished.  Cardiovascular: Normal rate and regular rhythm.   Respiratory: Effort normal and breath sounds normal.  GI: Soft. Bowel sounds are normal. There is no tenderness.  Musculoskeletal: Normal range of motion. He exhibits no edema.  Neurological: He is alert and oriented to person, place, and time.     Assessment/Plan Patient with stage IV esophageal carcinoma presents today for port a cath placement for chemotherapy. Details/risks of procedure d/w pt/wife with their understanding and consent.  Willie Owens,D KEVIN 07/30/2014, 8:47 AM

## 2014-07-30 NOTE — Discharge Instructions (Signed)
Implanted Port Home Guide °An implanted port is a type of central line that is placed under the skin. Central lines are used to provide IV access when treatment or nutrition needs to be given through a person's veins. Implanted ports are used for long-term IV access. An implanted port may be placed because:  °· You need IV medicine that would be irritating to the small veins in your hands or arms.   °· You need long-term IV medicines, such as antibiotics.   °· You need IV nutrition for a long period.   °· You need frequent blood draws for lab tests.   °· You need dialysis.   °Implanted ports are usually placed in the chest area, but they can also be placed in the upper arm, the abdomen, or the leg. An implanted port has two main parts:  °· Reservoir. The reservoir is round and will appear as a small, raised area under your skin. The reservoir is the part where a needle is inserted to give medicines or draw blood.   °· Catheter. The catheter is a thin, flexible tube that extends from the reservoir. The catheter is placed into a large vein. Medicine that is inserted into the reservoir goes into the catheter and then into the vein.   °HOW WILL I CARE FOR MY INCISION SITE? °Do not get the incision site wet. Bathe or shower as directed by your health care provider.  °HOW IS MY PORT ACCESSED? °Special steps must be taken to access the port:  °· Before the port is accessed, a numbing cream can be placed on the skin. This helps numb the skin over the port site.   °· Your health care provider uses a sterile technique to access the port. °¨ Your health care provider must put on a mask and sterile gloves. °¨ The skin over your port is cleaned carefully with an antiseptic and allowed to dry. °¨ The port is gently pinched between sterile gloves, and a needle is inserted into the port. °· Only "non-coring" port needles should be used to access the port. Once the port is accessed, a blood return should be checked. This helps  ensure that the port is in the vein and is not clogged.   °· If your port needs to remain accessed for a constant infusion, a clear (transparent) bandage will be placed over the needle site. The bandage and needle will need to be changed every week, or as directed by your health care provider.   °· Keep the bandage covering the needle clean and dry. Do not get it wet. Follow your health care provider's instructions on how to take a shower or bath while the port is accessed.   °· If your port does not need to stay accessed, no bandage is needed over the port.   °WHAT IS FLUSHING? °Flushing helps keep the port from getting clogged. Follow your health care provider's instructions on how and when to flush the port. Ports are usually flushed with saline solution or a medicine called heparin. The need for flushing will depend on how the port is used.  °· If the port is used for intermittent medicines or blood draws, the port will need to be flushed:   °¨ After medicines have been given.   °¨ After blood has been drawn.   °¨ As part of routine maintenance.   °· If a constant infusion is running, the port may not need to be flushed.   °HOW LONG WILL MY PORT STAY IMPLANTED? °The port can stay in for as long as your health care   provider thinks it is needed. When it is time for the port to come out, surgery will be done to remove it. The procedure is similar to the one performed when the port was put in.  °WHEN SHOULD I SEEK IMMEDIATE MEDICAL CARE? °When you have an implanted port, you should seek immediate medical care if:  °· You notice a bad smell coming from the incision site.   °· You have swelling, redness, or drainage at the incision site.   °· You have more swelling or pain at the port site or the surrounding area.   °· You have a fever that is not controlled with medicine. °Document Released: 12/13/2005 Document Revised: 10/03/2013 Document Reviewed: 08/20/2013 °ExitCare® Patient Information ©2015 [employer]. This  information is not intended to replace advice given to you by your health care provider. Make sure you discuss any questions you have with your health care provider. °Conscious Sedation °Sedation is the use of medicines to promote relaxation and relieve discomfort and anxiety. Conscious sedation is a type of sedation. Under conscious sedation you are less alert than normal but are still able to respond to instructions or stimulation. Conscious sedation is used during short medical and dental procedures. It is milder than deep sedation or general anesthesia and allows you to return to your regular activities sooner.  °LET YOUR HEALTH CARE PROVIDER KNOW ABOUT:  °· Any allergies you have. °· All medicines you are taking, including vitamins, herbs, eye drops, creams, and over-the-counter medicines. °· Use of steroids (by mouth or creams). °· Previous problems you or members of your family have had with the use of anesthetics. °· Any blood disorders you have. °· Previous surgeries you have had. °· Medical conditions you have. °· Possibility of pregnancy, if this applies. °· Use of cigarettes, alcohol, or illegal drugs. °RISKS AND COMPLICATIONS °Generally, this is a safe procedure. However, as with any procedure, problems can occur. Possible problems include: °· Oversedation. °· Trouble breathing on your own. You may need to have a breathing tube until you are awake and breathing on your own. °· Allergic reaction to any of the medicines used for the procedure. °BEFORE THE PROCEDURE °· You may have blood tests done. These tests can help show how well your kidneys and liver are working. They can also show how well your blood clots. °· A physical exam will be done.   °· Only take medicines as directed by your health care provider. You may need to stop taking medicines (such as blood thinners, aspirin, or nonsteroidal anti-inflammatory drugs) before the procedure.   °· Do not eat or drink at least 6 hours before the procedure  or as directed by your health care provider. °· Arrange for a responsible adult, family member, or friend to take you home after the procedure. He or she should stay with you for at least 24 hours after the procedure, until the medicine has worn off. °PROCEDURE  °· An intravenous (IV) catheter will be inserted into one of your veins. Medicine will be able to flow directly into your body through this catheter. You may be given medicine through this tube to help prevent pain and help you relax. °· The medical or dental procedure will be done. °AFTER THE PROCEDURE °· You will stay in a recovery area until the medicine has worn off. Your blood pressure and pulse will be checked.   °·  Depending on the procedure you had, you may be allowed to go home when you can tolerate liquids and your pain is under   control. °Document Released: 09/07/2001 Document Revised: 12/18/2013 Document Reviewed: 08/20/2013 °ExitCare® Patient Information ©2015 [employer]. This information is not intended to replace advice given to you by your health care provider. Make sure you discuss any questions you have with your health care provider. ° °

## 2014-07-31 ENCOUNTER — Other Ambulatory Visit: Payer: Self-pay | Admitting: Nurse Practitioner

## 2014-07-31 DIAGNOSIS — C159 Malignant neoplasm of esophagus, unspecified: Secondary | ICD-10-CM

## 2014-07-31 MED ORDER — DEXAMETHASONE 4 MG PO TABS: 8.0000 mg | ORAL_TABLET | Freq: Two times a day (BID) | ORAL | Status: DC

## 2014-07-31 MED ORDER — LORAZEPAM 0.5 MG PO TABS: 0.5000 mg | ORAL_TABLET | Freq: Four times a day (QID) | ORAL | Status: DC | PRN

## 2014-07-31 MED ORDER — ONDANSETRON HCL 8 MG PO TABS: 8.0000 mg | ORAL_TABLET | Freq: Two times a day (BID) | ORAL | Status: DC

## 2014-07-31 MED ORDER — LIDOCAINE-PRILOCAINE 2.5-2.5 % EX CREA: 1.0000 "application " | TOPICAL_CREAM | CUTANEOUS | Status: DC | PRN

## 2014-07-31 MED ORDER — PROCHLORPERAZINE MALEATE 10 MG PO TABS: 10.0000 mg | ORAL_TABLET | Freq: Four times a day (QID) | ORAL | Status: DC | PRN

## 2014-08-01 ENCOUNTER — Encounter: Payer: Self-pay | Admitting: Hematology & Oncology

## 2014-08-01 ENCOUNTER — Telehealth: Payer: Self-pay | Admitting: Hematology & Oncology

## 2014-08-01 ENCOUNTER — Ambulatory Visit (HOSPITAL_BASED_OUTPATIENT_CLINIC_OR_DEPARTMENT_OTHER): Payer: Managed Care, Other (non HMO)

## 2014-08-01 VITALS — BP 141/78 | HR 79 | Temp 96.5°F | Resp 18

## 2014-08-01 DIAGNOSIS — G620 Drug-induced polyneuropathy: Secondary | ICD-10-CM

## 2014-08-01 DIAGNOSIS — C159 Malignant neoplasm of esophagus, unspecified: Secondary | ICD-10-CM

## 2014-08-01 DIAGNOSIS — C155 Malignant neoplasm of lower third of esophagus: Secondary | ICD-10-CM

## 2014-08-01 DIAGNOSIS — T451X5A Adverse effect of antineoplastic and immunosuppressive drugs, initial encounter: Secondary | ICD-10-CM

## 2014-08-01 DIAGNOSIS — Z5111 Encounter for antineoplastic chemotherapy: Secondary | ICD-10-CM

## 2014-08-01 MED ORDER — MAGNESIUM SULFATE 50 % IJ SOLN
Freq: Once | INTRAVENOUS | Status: AC
  Administered 2014-08-01: 15:00:00 via INTRAVENOUS
  Filled 2014-08-01: qty 100

## 2014-08-01 MED ORDER — HEPARIN SOD (PORK) LOCK FLUSH 100 UNIT/ML IV SOLN
500.0000 [IU] | Freq: Once | INTRAVENOUS | Status: AC | PRN
  Administered 2014-08-01: 500 [IU]
  Filled 2014-08-01: qty 5

## 2014-08-01 MED ORDER — VENLAFAXINE HCL 37.5 MG PO TABS: 37.5000 mg | ORAL_TABLET | Freq: Two times a day (BID) | ORAL | Status: DC

## 2014-08-01 MED ORDER — EPIRUBICIN HCL CHEMO IV INJECTION 200 MG/100ML
46.0000 mg/m2 | Freq: Once | INTRAVENOUS | Status: AC
  Administered 2014-08-01: 100 mg via INTRAVENOUS
  Filled 2014-08-01: qty 50

## 2014-08-01 MED ORDER — SODIUM CHLORIDE 0.9 % IJ SOLN
10.0000 mL | INTRAMUSCULAR | Status: DC | PRN
  Administered 2014-08-01: 10 mL
  Filled 2014-08-01: qty 10

## 2014-08-01 MED ORDER — DEXAMETHASONE SODIUM PHOSPHATE 20 MG/5ML IJ SOLN
INTRAMUSCULAR | Status: AC
  Filled 2014-08-01: qty 5

## 2014-08-01 MED ORDER — DEXTROSE 5 % IV SOLN
Freq: Once | INTRAVENOUS | Status: AC
  Administered 2014-08-01: 10:00:00 via INTRAVENOUS

## 2014-08-01 MED ORDER — ONDANSETRON 16 MG/50ML IVPB (CHCC)
16.0000 mg | Freq: Once | INTRAVENOUS | Status: AC
Start: 2014-08-01 — End: 2014-08-01
  Administered 2014-08-01: 16 mg via INTRAVENOUS

## 2014-08-01 MED ORDER — DEXAMETHASONE SODIUM PHOSPHATE 20 MG/5ML IJ SOLN
20.0000 mg | Freq: Once | INTRAMUSCULAR | Status: AC
  Administered 2014-08-01: 20 mg via INTRAVENOUS

## 2014-08-01 MED ORDER — OXALIPLATIN CHEMO INJECTION 100 MG/20ML
130.0000 mg/m2 | Freq: Once | INTRAVENOUS | Status: AC
  Administered 2014-08-01: 285 mg via INTRAVENOUS
  Filled 2014-08-01: qty 57

## 2014-08-01 MED ORDER — ONDANSETRON 16 MG/50ML IVPB (CHCC)
INTRAVENOUS | Status: AC
  Filled 2014-08-01: qty 16

## 2014-08-01 NOTE — Telephone Encounter (Signed)
[employer] approved XELODA.   Dates: 07/31/2014 - 08/01/2015      P: [phone] F: [phone]    COPY SCANNED

## 2014-08-01 NOTE — Patient Instructions (Signed)
Oxaliplatin Injection What is this medicine? OXALIPLATIN (ox AL i PLA tin) is a chemotherapy drug. It targets fast dividing cells, like cancer cells, and causes these cells to die. This medicine is used to treat cancers of the colon and rectum, and many other cancers. This medicine may be used for other purposes; ask your health care provider or pharmacist if you have questions. COMMON BRAND NAME(S): Eloxatin What should I tell my health care provider before I take this medicine? They need to know if you have any of these conditions: -kidney disease -an unusual or allergic reaction to oxaliplatin, other chemotherapy, other medicines, foods, dyes, or preservatives -pregnant or trying to get pregnant -breast-feeding How should I use this medicine? This drug is given as an infusion into a vein. It is administered in a hospital or clinic by a specially trained health care professional. Talk to your pediatrician regarding the use of this medicine in children. Special care may be needed. Overdosage: If you think you have taken too much of this medicine contact a poison control center or emergency room at once. NOTE: This medicine is only for you. Do not share this medicine with others. What if I miss a dose? It is important not to miss a dose. Call your doctor or health care professional if you are unable to keep an appointment. What may interact with this medicine? -medicines to increase blood counts like filgrastim, pegfilgrastim, sargramostim -probenecid -some antibiotics like amikacin, gentamicin, neomycin, polymyxin B, streptomycin, tobramycin -zalcitabine Talk to your doctor or health care professional before taking any of these medicines: -acetaminophen -aspirin -ibuprofen -ketoprofen -naproxen This list may not describe all possible interactions. Give your health care provider a list of all the medicines, herbs, non-prescription drugs, or dietary supplements you use. Also tell them if  you smoke, drink alcohol, or use illegal drugs. Some items may interact with your medicine. What should I watch for while using this medicine? Your condition will be monitored carefully while you are receiving this medicine. You will need important blood work done while you are taking this medicine. This medicine can make you more sensitive to cold. Do not drink cold drinks or use ice. Cover exposed skin before coming in contact with cold temperatures or cold objects. When out in cold weather wear warm clothing and cover your mouth and nose to warm the air that goes into your lungs. Tell your doctor if you get sensitive to the cold. This drug may make you feel generally unwell. This is not uncommon, as chemotherapy can affect healthy cells as well as cancer cells. Report any side effects. Continue your course of treatment even though you feel ill unless your doctor tells you to stop. In some cases, you may be given additional medicines to help with side effects. Follow all directions for their use. Call your doctor or health care professional for advice if you get a fever, chills or sore throat, or other symptoms of a cold or flu. Do not treat yourself. This drug decreases your body's ability to fight infections. Try to avoid being around people who are sick. This medicine may increase your risk to bruise or bleed. Call your doctor or health care professional if you notice any unusual bleeding. Be careful brushing and flossing your teeth or using a toothpick because you may get an infection or bleed more easily. If you have any dental work done, tell your dentist you are receiving this medicine. Avoid taking products that contain aspirin, acetaminophen, ibuprofen, naproxen,   or ketoprofen unless instructed by your doctor. These medicines may hide a fever. Do not become pregnant while taking this medicine. Women should inform their doctor if they wish to become pregnant or think they might be pregnant. There  is a potential for serious side effects to an unborn child. Talk to your health care professional or pharmacist for more information. Do not breast-feed an infant while taking this medicine. Call your doctor or health care professional if you get diarrhea. Do not treat yourself. What side effects may I notice from receiving this medicine? Side effects that you should report to your doctor or health care professional as soon as possible: -allergic reactions like skin rash, itching or hives, swelling of the face, lips, or tongue -low blood counts - This drug may decrease the number of white blood cells, red blood cells and platelets. You may be at increased risk for infections and bleeding. -signs of infection - fever or chills, cough, sore throat, pain or difficulty passing urine -signs of decreased platelets or bleeding - bruising, pinpoint red spots on the skin, black, tarry stools, nosebleeds -signs of decreased red blood cells - unusually weak or tired, fainting spells, lightheadedness -breathing problems -chest pain, pressure -cough -diarrhea -jaw tightness -mouth sores -nausea and vomiting -pain, swelling, redness or irritation at the injection site -pain, tingling, numbness in the hands or feet -problems with balance, talking, walking -redness, blistering, peeling or loosening of the skin, including inside the mouth -trouble passing urine or change in the amount of urine Side effects that usually do not require medical attention (report to your doctor or health care professional if they continue or are bothersome): -changes in vision -constipation -hair loss -loss of appetite -metallic taste in the mouth or changes in taste -stomach pain This list may not describe all possible side effects. Call your doctor for medical advice about side effects. You may report side effects to FDA at 1-800-FDA-1088. Where should I keep my medicine? This drug is given in a hospital or clinic and  will not be stored at home. NOTE: This sheet is a summary. It may not cover all possible information. If you have questions about this medicine, talk to your doctor, pharmacist, or health care provider.  2015, [employer]. (2008-07-09 17:22:47) Epirubicin injection What is this medicine? EPIRUBICIN (ep i ROO bi sin) is a chemotherapy drug. This medicine is used to treat breast cancer. This medicine may be used for other purposes; ask your health care provider or pharmacist if you have questions. COMMON BRAND NAME(S): Ellence What should I tell my health care provider before I take this medicine? They need to know if you have any of these conditions: -blood disorders -heart disease, recent heart attack -infection (especially a virus infection such as chickenpox, cold sores, or herpes) -irregular heartbeat -kidney disease -liver disease -recent or ongoing radiation therapy -an unusual or allergic reaction to epirubicin, other chemotherapy agents, other medicines, foods, dyes, or preservatives -pregnant or trying to get pregnant -breast-feeding How should I use this medicine? This drug is given as an infusion into a vein. It is administered in a hospital or clinic by a specially trained health care professional. If you have pain, swelling, burning or any unusual feeling around the site of your injection, tell your health care professional right away. Talk to your pediatrician regarding the use of this medicine in children. Special care may be needed. Overdosage: If you think you have taken too much of this medicine contact a poison  control center or emergency room at once. NOTE: This medicine is only for you. Do not share this medicine with others. What if I miss a dose? It is important not to miss your dose. Call your doctor or health care professional if you are unable to keep an appointment. What may interact with this medicine? Do not take this medicine with any of the  following medications: -cisapride -droperidol -halofantrine -pimozide This medicine may also interact with the following medications: -chloroquine -chlorpromazine -clarithromycin -cimetidine -cyclosporine -erythromycin -medicines for blood pressure like amlodipine, felodipine, nifedipine -medicines for depression, anxiety, or psychotic disturbances -medicines for irregular heart beat like amiodarone, bepridil, dofetilide, encainide, flecainide, propafenone, quinidine -medicines for nausea, vomiting like dolasetron, ondansetron, palonosetron -medicines to increase blood counts like filgrastim, pegfilgrastim, sargramostim -methadone -methotrexate -pentamidine -vaccines Talk to your doctor or health care professional before taking any of these medicines: -acetaminophen -aspirin -ibuprofen -ketoprofen -naproxen This list may not describe all possible interactions. Give your health care provider a list of all the medicines, herbs, non-prescription drugs, or dietary supplements you use. Also tell them if you smoke, drink alcohol, or use illegal drugs. Some items may interact with your medicine. What should I watch for while using this medicine? Your condition will be monitored carefully while you are receiving this medicine. You will need important blood work done while you are taking this medicine. This drug may make you feel generally unwell. This is not uncommon, as chemotherapy can affect healthy cells as well as cancer cells. Report any side effects. Continue your course of treatment even though you feel ill unless your doctor tells you to stop. Your urine may turn red for a few days after your dose. This is not blood. If your urine is dark or brown, call your doctor. In some cases, you may be given additional medicines to help with side effects. Follow all directions for their use. Call your doctor or health care professional for advice if you get a fever, chills or sore throat, or  other symptoms of a cold or flu. Do not treat yourself. This drug decreases your body's ability to fight infections. Try to avoid being around people who are sick. This medicine may increase your risk to bruise or bleed. Call your doctor or health care professional if you notice any unusual bleeding. Be careful brushing and flossing your teeth or using a toothpick because you may get an infection or bleed more easily. If you have any dental work done, tell your dentist you are receiving this medicine. Avoid taking products that contain aspirin, acetaminophen, ibuprofen, naproxen, or ketoprofen unless instructed by your doctor. These medicines may hide a fever. Men and women of childbearing age should use effective birth control methods while using taking this medicine. Do not become pregnant while taking this medicine. There is a potential for serious side effects to an unborn child. Talk to your health care professional or pharmacist for more information. Do not breast-feed an infant while taking this medicine. There is a maximum amount of this medicine you should receive throughout your life. The amount depends on the medical condition being treated and your overall health. Your doctor will watch how much of this medicine you receive in your lifetime. Tell your doctor if you have taken this medicine before. What side effects may I notice from receiving this medicine? Side effects that you should report to your doctor or health care professional as soon as possible: -allergic reactions like skin rash, itching or hives, swelling of  the face, lips, or tongue -low blood counts - this medicine may decrease the number of white blood cells, red blood cells and platelets. You may be at increased risk for infections and bleeding. -signs of infection - fever or chills, cough, sore throat, pain or difficulty passing urine -signs of decreased platelets or bleeding - bruising, pinpoint red spots on the skin, black,  tarry stools, blood in the urine -signs of decreased red blood cells - unusually weak or tired, fainting spells, lightheadedness -breathing problems -chest pain -gout pain -fast, irregular heartbeat -mouth sores -pain, swelling, redness at site where injected -swelling of ankles, feet, or hands Side effects that usually do not require medical attention (report to your doctor or health care professional if they continue or are bothersome): -changes in skin or nail color -diarrhea -hair loss -hot flashes, facial flushing -increased skin sensitivity to the sun -loss of appetite -nausea, vomiting -red colored urine -stomach upset This list may not describe all possible side effects. Call your doctor for medical advice about side effects. You may report side effects to FDA at 1-800-FDA-1088. Where should I keep my medicine? This drug is given in a hospital or clinic and will not be stored at home. NOTE: This sheet is a summary. It may not cover all possible information. If you have questions about this medicine, talk to your doctor, pharmacist, or health care provider.  2015, [employer]. (2013-04-10 96:22:29)

## 2014-08-01 NOTE — Telephone Encounter (Signed)
[employer]  approved ONDANSETRON oral tab for only 18 per mo for a year. Pt can go to back to the pharmacy and pick up the other 6 tabs. I think he already got the 12 he was approved for earlier.  Dates: 08/01/2014 - 12/26/2014      P: [phone]    COPY SCANNED

## 2014-08-01 NOTE — Progress Notes (Signed)
1400 Complain of tingling of fingers, lips. D5 changed to NS. Dr. Marin Olp notified. Will give Oxaliplatin over 3-4 hours next treatment. Will also give calcium/mg today.

## 2014-08-02 ENCOUNTER — Telehealth: Payer: Self-pay | Admitting: Internal Medicine

## 2014-08-05 ENCOUNTER — Encounter: Payer: Self-pay | Admitting: *Deleted

## 2014-08-05 ENCOUNTER — Other Ambulatory Visit: Payer: Self-pay | Admitting: *Deleted

## 2014-08-05 DIAGNOSIS — K5900 Constipation, unspecified: Secondary | ICD-10-CM

## 2014-08-05 MED ORDER — LACTULOSE SOLN: Status: DC

## 2014-08-05 NOTE — Progress Notes (Unsigned)
Pt's wife called to report pt has not had a BM since last Thursday 8//6/15.  She has been giving him Colace with no relief.  Per Dr. Marin Olp, pt to take Lactulose 20 ml every hour until he has a good BM.  Rx called to pt's pharmacy.

## 2014-08-09 ENCOUNTER — Ambulatory Visit (HOSPITAL_BASED_OUTPATIENT_CLINIC_OR_DEPARTMENT_OTHER): Payer: Managed Care, Other (non HMO)

## 2014-08-09 ENCOUNTER — Telehealth: Payer: Self-pay

## 2014-08-09 VITALS — BP 138/72 | HR 88 | Temp 97.8°F | Resp 18

## 2014-08-09 DIAGNOSIS — C155 Malignant neoplasm of lower third of esophagus: Secondary | ICD-10-CM

## 2014-08-09 DIAGNOSIS — C159 Malignant neoplasm of esophagus, unspecified: Secondary | ICD-10-CM

## 2014-08-09 MED ORDER — DEXAMETHASONE SODIUM PHOSPHATE 10 MG/ML IJ SOLN
20.0000 mg | Freq: Once | INTRAMUSCULAR | Status: AC
  Administered 2014-08-09: 20 mg via INTRAMUSCULAR

## 2014-08-09 MED ORDER — DEXAMETHASONE SODIUM PHOSPHATE 20 MG/5ML IJ SOLN
INTRAMUSCULAR | Status: AC
  Filled 2014-08-09: qty 5

## 2014-08-09 NOTE — Patient Instructions (Signed)
Dexamethasone injection What is this medicine? DEXAMETHASONE (dex a METH a sone) is a corticosteroid. It is used to treat inflammation of the skin, joints, lungs, and other organs. Common conditions treated include asthma, allergies, and arthritis. It is also used for other conditions, like blood disorders and diseases of the adrenal glands. This medicine may be used for other purposes; ask your health care provider or pharmacist if you have questions. COMMON BRAND NAME(S): Decadron, Solurex What should I tell my health care provider before I take this medicine? They need to know if you have any of these conditions: -blood clotting problems -Cushing's syndrome -diabetes -glaucoma -heart problems or disease -high blood pressure -infection like herpes, measles, tuberculosis, or chickenpox -kidney disease -liver disease -mental problems -myasthenia gravis -osteoporosis -previous heart attack -seizures -stomach, ulcer or intestine disease including colitis and diverticulitis -thyroid problem -an unusual or allergic reaction to dexamethasone, corticosteroids, other medicines, lactose, foods, dyes, or preservatives -pregnant or trying to get pregnant -breast-feeding How should I use this medicine? This medicine is for injection into a muscle, joint, lesion, soft tissue, or vein. It is given by a health care professional in a hospital or clinic setting. Talk to your pediatrician regarding the use of this medicine in children. Special care may be needed. Overdosage: If you think you have taken too much of this medicine contact a poison control center or emergency room at once. NOTE: This medicine is only for you. Do not share this medicine with others. What if I miss a dose? This may not apply. If you are having a series of injections over a prolonged period, try not to miss an appointment. Call your doctor or health care professional to reschedule if you are unable to keep an  appointment. What may interact with this medicine? Do not take this medicine with any of the following medications: -mifepristone, RU-486 -vaccines This medicine may also interact with the following medications: -amphotericin B -antibiotics like clarithromycin, erythromycin, and troleandomycin -aspirin and aspirin-like drugs -barbiturates like phenobarbital -carbamazepine -cholestyramine -cholinesterase inhibitors like donepezil, galantamine, rivastigmine, and tacrine -cyclosporine -digoxin -diuretics -ephedrine -male hormones, like estrogens or progestins and birth control pills -indinavir -isoniazid -ketoconazole -medicines for diabetes -medicines that improve muscle tone or strength for conditions like myasthenia gravis -NSAIDs, medicines for pain and inflammation, like ibuprofen or naproxen -phenytoin -rifampin -thalidomide -warfarin This list may not describe all possible interactions. Give your health care provider a list of all the medicines, herbs, non-prescription drugs, or dietary supplements you use. Also tell them if you smoke, drink alcohol, or use illegal drugs. Some items may interact with your medicine. What should I watch for while using this medicine? Your condition will be monitored carefully while you are receiving this medicine. If you are taking this medicine for a long time, carry an identification card with your name and address, the type and dose of your medicine, and your doctor's name and address. This medicine may increase your risk of getting an infection. Stay away from people who are sick. Tell your doctor or health care professional if you are around anyone with measles or chickenpox. Talk to your health care provider before you get any vaccines that you take this medicine. If you are going to have surgery, tell your doctor or health care professional that you have taken this medicine within the last twelve months. Ask your doctor or health care  professional about your diet. You may need to lower the amount of salt you eat. The medicine can increase  your blood sugar. If you are a diabetic check with your doctor if you need help adjusting the dose of your diabetic medicine. What side effects may I notice from receiving this medicine? Side effects that you should report to your doctor or health care professional as soon as possible: -allergic reactions like skin rash, itching or hives, swelling of the face, lips, or tongue -black or tarry stools -change in the amount of urine -changes in vision -confusion, excitement, restlessness, a false sense of well-being -fever, sore throat, sneezing, cough, or other signs of infection, wounds that will not heal -hallucinations -increased thirst -mental depression, mood swings, mistaken feelings of self importance or of being mistreated -pain in hips, back, ribs, arms, shoulders, or legs -pain, redness, or irritation at the injection site -redness, blistering, peeling or loosening of the skin, including inside the mouth -rounding out of face -swelling of feet or lower legs -unusual bleeding or bruising -unusual tired or weak -wounds that do not heal Side effects that usually do not require medical attention (report to your doctor or health care professional if they continue or are bothersome): -diarrhea or constipation -change in taste -headache -nausea, vomiting -skin problems, acne, thin and shiny skin -touble sleeping -unusual growth of hair on the face or body -weight gain This list may not describe all possible side effects. Call your doctor for medical advice about side effects. You may report side effects to FDA at 1-800-FDA-1088. Where should I keep my medicine? This drug is given in a hospital or clinic and will not be stored at home. NOTE: This sheet is a summary. It may not cover all possible information. If you have questions about this medicine, talk to your doctor,  pharmacist, or health care provider.  2015, [employer]. (2008-04-04 14:04:12)

## 2014-08-09 NOTE — Telephone Encounter (Signed)
Received call from pt's wife. Pt received chemo last week and "had been doing well." Reports last pm, difficulty swallowing returned and has worsened this am, with pt being unable to "get anything to pass by the tumor." Ms Heindl reports even cereal is too difficult to swallow. Questions if steroid medication taken 3 days post chemo had helped. Denies nausea, dehydration, excessive fatigue or bowel problems at this time.   Per Dr Marin Olp, pt to being Dexamethasone 10mg  daily. Wife repeats instructions and confirms they have adequate supply of this med at this time. Aware to contact our office if sx are not alleviated. dph

## 2014-08-12 ENCOUNTER — Encounter: Payer: Managed Care, Other (non HMO) | Admitting: Internal Medicine

## 2014-08-12 ENCOUNTER — Telehealth: Payer: Self-pay

## 2014-08-12 NOTE — Telephone Encounter (Signed)
Received call from pt's wife, Almyra Free. Per spouse, pt has had no trouble swallowing since receiving steroid injection & restarting po dex on Friday, but "he only slept 3-4 hours all weekend."  Per Almyra Free they have tried "meditation," po Benadryl, Xanax, and Melatonin. Pt does have script for Lorazepam for nausea.   Per Dr Marin Olp, instructed Almyra Free to have pt take 2 Lorazepam 0.5mg  (total 1mg ) before bed tonight. If this does not induce sleep, script will be called in for Trazadone 100mg  po qhs. Almyra Free aware to call our office tomorrow to notify us if Lorazepam is effective tonight. dph

## 2014-08-13 ENCOUNTER — Telehealth: Payer: Self-pay | Admitting: Hematology & Oncology

## 2014-08-13 NOTE — Telephone Encounter (Signed)
[employer] approved XELODA  Dates: 07/31/2014 - 08/01/2015     P: [phone]    COPY SCANNED

## 2014-08-15 ENCOUNTER — Other Ambulatory Visit: Payer: Self-pay | Admitting: *Deleted

## 2014-08-15 ENCOUNTER — Telehealth: Payer: Self-pay | Admitting: *Deleted

## 2014-08-15 DIAGNOSIS — E0590 Thyrotoxicosis, unspecified without thyrotoxic crisis or storm: Secondary | ICD-10-CM

## 2014-08-15 NOTE — Telephone Encounter (Signed)
Patient wife called me to let me know that patient has had diarrhea x 4 days, very watery and keeping him up at night.  Dr. Niel Hummer stated to stop Xeloda for now and to take imodium

## 2014-08-19 ENCOUNTER — Other Ambulatory Visit: Payer: Self-pay | Admitting: *Deleted

## 2014-08-19 MED ORDER — DIPHENOXYLATE-ATROPINE 2.5-0.025 MG PO TABS: 1.0000 | ORAL_TABLET | Freq: Four times a day (QID) | ORAL | Status: DC | PRN

## 2014-08-19 NOTE — Telephone Encounter (Signed)
Patient wife called stating patient still has diarrhea.  Offered to bring patient in for fluids. Wife stated that patient is at work in [place] and wouldn't be able to come in until later this afternoon but patient is eating and drinking excellent.  Sent rx for Lomotil.

## 2014-08-22 ENCOUNTER — Other Ambulatory Visit: Payer: Self-pay | Admitting: Hematology & Oncology

## 2014-08-22 ENCOUNTER — Ambulatory Visit (HOSPITAL_BASED_OUTPATIENT_CLINIC_OR_DEPARTMENT_OTHER): Payer: Managed Care, Other (non HMO) | Admitting: Hematology & Oncology

## 2014-08-22 ENCOUNTER — Ambulatory Visit (HOSPITAL_BASED_OUTPATIENT_CLINIC_OR_DEPARTMENT_OTHER): Payer: Managed Care, Other (non HMO)

## 2014-08-22 ENCOUNTER — Other Ambulatory Visit (HOSPITAL_BASED_OUTPATIENT_CLINIC_OR_DEPARTMENT_OTHER): Payer: Managed Care, Other (non HMO) | Admitting: Lab

## 2014-08-22 ENCOUNTER — Other Ambulatory Visit: Payer: Self-pay

## 2014-08-22 VITALS — BP 116/60 | HR 69 | Temp 97.0°F | Resp 18

## 2014-08-22 DIAGNOSIS — C155 Malignant neoplasm of lower third of esophagus: Secondary | ICD-10-CM

## 2014-08-22 DIAGNOSIS — C159 Malignant neoplasm of esophagus, unspecified: Secondary | ICD-10-CM

## 2014-08-22 DIAGNOSIS — R7989 Other specified abnormal findings of blood chemistry: Secondary | ICD-10-CM

## 2014-08-22 DIAGNOSIS — Z5111 Encounter for antineoplastic chemotherapy: Secondary | ICD-10-CM

## 2014-08-22 DIAGNOSIS — T451X5A Adverse effect of antineoplastic and immunosuppressive drugs, initial encounter: Secondary | ICD-10-CM

## 2014-08-22 DIAGNOSIS — E0590 Thyrotoxicosis, unspecified without thyrotoxic crisis or storm: Secondary | ICD-10-CM

## 2014-08-22 DIAGNOSIS — G620 Drug-induced polyneuropathy: Secondary | ICD-10-CM

## 2014-08-22 LAB — CBC WITH DIFFERENTIAL (CANCER CENTER ONLY)
BASO#: 0 10*3/uL (ref 0.0–0.2)
BASO%: 0.2 % (ref 0.0–2.0)
EOS%: 0.1 % (ref 0.0–7.0)
Eosinophils Absolute: 0 10*3/uL (ref 0.0–0.5)
HEMATOCRIT: 39.2 % (ref 38.7–49.9)
HEMOGLOBIN: 13.4 g/dL (ref 13.0–17.1)
LYMPH#: 1.9 10*3/uL (ref 0.9–3.3)
LYMPH%: 18.6 % (ref 14.0–48.0)
MCH: 26.7 pg — ABNORMAL LOW (ref 28.0–33.4)
MCHC: 34.2 g/dL (ref 32.0–35.9)
MCV: 78 fL — ABNORMAL LOW (ref 82–98)
MONO#: 1.5 10*3/uL — AB (ref 0.1–0.9)
MONO%: 14.5 % — ABNORMAL HIGH (ref 0.0–13.0)
NEUT#: 6.9 10*3/uL — ABNORMAL HIGH (ref 1.5–6.5)
NEUT%: 66.6 % (ref 40.0–80.0)
Platelets: 351 10*3/uL (ref 145–400)
RBC: 5.01 10*6/uL (ref 4.20–5.70)
RDW: 20 % — ABNORMAL HIGH (ref 11.1–15.7)
WBC: 10.3 10*3/uL — AB (ref 4.0–10.0)

## 2014-08-22 LAB — CMP (CANCER CENTER ONLY)
ALBUMIN: 2.6 g/dL — AB (ref 3.3–5.5)
ALK PHOS: 36 U/L (ref 26–84)
ALT(SGPT): 17 U/L (ref 10–47)
AST: 16 U/L (ref 11–38)
BUN: 19 mg/dL (ref 7–22)
CO2: 27 mEq/L (ref 18–33)
Calcium: 8.4 mg/dL (ref 8.0–10.3)
Chloride: 94 mEq/L — ABNORMAL LOW (ref 98–108)
Creat: 0.8 mg/dl (ref 0.6–1.2)
Glucose, Bld: 103 mg/dL (ref 73–118)
Potassium: 2.8 mEq/L — CL (ref 3.3–4.7)
Sodium: 131 mEq/L (ref 128–145)
Total Bilirubin: 0.9 mg/dl (ref 0.20–1.60)
Total Protein: 4.9 g/dL — ABNORMAL LOW (ref 6.4–8.1)

## 2014-08-22 LAB — TSH CHCC: TSH: 3.29 m(IU)/L (ref 0.320–4.118)

## 2014-08-22 LAB — TECHNOLOGIST REVIEW CHCC SATELLITE

## 2014-08-22 MED ORDER — OXALIPLATIN CHEMO INJECTION 100 MG/20ML
130.0000 mg/m2 | Freq: Once | INTRAVENOUS | Status: AC
  Administered 2014-08-22: 285 mg via INTRAVENOUS
  Filled 2014-08-22: qty 57

## 2014-08-22 MED ORDER — SODIUM CHLORIDE 0.9 % IJ SOLN
10.0000 mL | INTRAMUSCULAR | Status: DC | PRN
  Administered 2014-08-22: 10 mL
  Filled 2014-08-22: qty 10

## 2014-08-22 MED ORDER — FLUCONAZOLE IN DEXTROSE 200 MG/100ML IV SOLN
200.0000 mg | Freq: Once | INTRAVENOUS | Status: AC
Start: 2014-08-22 — End: 2014-08-22
  Administered 2014-08-22: 200 mg via INTRAVENOUS
  Filled 2014-08-22: qty 100

## 2014-08-22 MED ORDER — VENLAFAXINE HCL 37.5 MG PO TABS: 37.5000 mg | ORAL_TABLET | Freq: Two times a day (BID) | ORAL | Status: DC

## 2014-08-22 MED ORDER — DIPHENHYDRAMINE HCL 50 MG/ML IJ SOLN
25.0000 mg | Freq: Once | INTRAMUSCULAR | Status: AC
  Administered 2014-08-22: 25 mg via INTRAVENOUS

## 2014-08-22 MED ORDER — POTASSIUM CHLORIDE CRYS ER 20 MEQ PO TBCR
40.0000 meq | EXTENDED_RELEASE_TABLET | Freq: Two times a day (BID) | ORAL | Status: DC
  Administered 2014-08-22: 40 meq via ORAL
  Filled 2014-08-22: qty 2

## 2014-08-22 MED ORDER — VENLAFAXINE HCL 37.5 MG PO TABS
75.0000 mg | ORAL_TABLET | Freq: Two times a day (BID) | ORAL | Status: DC
Start: 2014-08-22 — End: 2014-08-22

## 2014-08-22 MED ORDER — FLUCONAZOLE IN DEXTROSE 200 MG/100ML IV SOLN: 200.0000 mg | Freq: Once | INTRAVENOUS | Status: DC

## 2014-08-22 MED ORDER — HEPARIN SOD (PORK) LOCK FLUSH 100 UNIT/ML IV SOLN
250.0000 [IU] | Freq: Once | INTRAVENOUS | Status: DC | PRN
  Filled 2014-08-22: qty 5

## 2014-08-22 MED ORDER — COLD PACK MISC ONCOLOGY
1.0000 | Freq: Once | Status: DC | PRN
  Filled 2014-08-22: qty 1

## 2014-08-22 MED ORDER — OXALIPLATIN CHEMO INJECTION 100 MG/20ML
130.0000 mg/m2 | Freq: Once | INTRAVENOUS | Status: DC
  Filled 2014-08-22: qty 57

## 2014-08-22 MED ORDER — FLUCONAZOLE IN SODIUM CHLORIDE 400-0.9 MG/200ML-% IV SOLN
200.0000 mg | INTRAVENOUS | Status: DC
  Filled 2014-08-22: qty 200

## 2014-08-22 MED ORDER — POTASSIUM CHLORIDE CRYS ER 20 MEQ PO TBCR: EXTENDED_RELEASE_TABLET | ORAL | Status: DC

## 2014-08-22 MED ORDER — ONDANSETRON 16 MG/50ML IVPB (CHCC)
INTRAVENOUS | Status: AC
  Filled 2014-08-22: qty 16

## 2014-08-22 MED ORDER — DEXTROSE 5 % IV SOLN
Freq: Once | INTRAVENOUS | Status: AC
  Administered 2014-08-22: 10:00:00 via INTRAVENOUS

## 2014-08-22 MED ORDER — EPIRUBICIN HCL CHEMO IV INJECTION 200 MG/100ML
50.0000 mg/m2 | Freq: Once | INTRAVENOUS | Status: AC
  Administered 2014-08-22: 110 mg via INTRAVENOUS
  Filled 2014-08-22: qty 55

## 2014-08-22 MED ORDER — HEPARIN SOD (PORK) LOCK FLUSH 100 UNIT/ML IV SOLN
500.0000 [IU] | Freq: Once | INTRAVENOUS | Status: AC | PRN
  Administered 2014-08-22: 500 [IU]
  Filled 2014-08-22: qty 5

## 2014-08-22 MED ORDER — ALTEPLASE 2 MG IJ SOLR
2.0000 mg | Freq: Once | INTRAMUSCULAR | Status: DC | PRN
  Filled 2014-08-22: qty 2

## 2014-08-22 MED ORDER — SODIUM CHLORIDE 0.9 % IJ SOLN
3.0000 mL | INTRAMUSCULAR | Status: DC | PRN
  Filled 2014-08-22: qty 10

## 2014-08-22 MED ORDER — FLUCONAZOLE 100 MG PO TABS: 100.0000 mg | ORAL_TABLET | Freq: Every day | ORAL | Status: DC

## 2014-08-22 MED ORDER — METHYLPREDNISOLONE SODIUM SUCC 125 MG IJ SOLR
125.0000 mg | Freq: Once | INTRAMUSCULAR | Status: AC
Start: 2014-08-22 — End: 2014-08-22
  Administered 2014-08-22: 125 mg via INTRAVENOUS

## 2014-08-22 MED ORDER — DEXAMETHASONE 4 MG PO TABS: 8.0000 mg | ORAL_TABLET | Freq: Two times a day (BID) | ORAL | Status: DC

## 2014-08-22 MED ORDER — SODIUM CHLORIDE 0.9 % IV SOLN: Freq: Once | INTRAVENOUS | Status: DC

## 2014-08-22 MED ORDER — HOT PACK MISC ONCOLOGY
1.0000 | Freq: Once | Status: DC | PRN
  Filled 2014-08-22: qty 1

## 2014-08-22 MED ORDER — DEXAMETHASONE SODIUM PHOSPHATE 20 MG/5ML IJ SOLN
20.0000 mg | Freq: Once | INTRAMUSCULAR | Status: AC
  Administered 2014-08-22: 20 mg via INTRAVENOUS

## 2014-08-22 MED ORDER — METHYLPREDNISOLONE SODIUM SUCC 125 MG IJ SOLR
INTRAMUSCULAR | Status: AC
Start: 2014-08-22 — End: 2014-08-22
  Filled 2014-08-22: qty 2

## 2014-08-22 MED ORDER — ONDANSETRON 16 MG/50ML IVPB (CHCC)
16.0000 mg | Freq: Once | INTRAVENOUS | Status: AC
  Administered 2014-08-22: 16 mg via INTRAVENOUS

## 2014-08-22 MED ORDER — DEXAMETHASONE SODIUM PHOSPHATE 20 MG/5ML IJ SOLN
INTRAMUSCULAR | Status: AC
  Filled 2014-08-22: qty 5

## 2014-08-22 NOTE — Progress Notes (Signed)
1440 Patient complained of numbness and tingling in fingers and hands.  OXaliplatin turned off.  Waiting to talk to Dr. Marin Olp.  Patient got up to go to the bathroom.  Wife came to get [place] patient was audibly wheezing and struggling to breath.  O2 Sat is 92%  Dr. Marin Olp notified.  O2 via mask.placed on patient at [address] [place].  1450 Solumedrol 125 mg IV given per order 1455  Benadryl 25 mg IV given. O2 Sat 99%.  1500 Patient taken back to chair via WC .  Patient resting comfortably

## 2014-08-22 NOTE — Addendum Note (Signed)
Addended by: Burney Gauze R on: 08/22/2014 04:27 PM   Modules accepted: Orders

## 2014-08-22 NOTE — Progress Notes (Signed)
Good blood return noted prior to vesicant administration and after every 3cc of administration. dph

## 2014-08-22 NOTE — Patient Instructions (Signed)
Oxaliplatin Injection What is this medicine? OXALIPLATIN (ox AL i PLA tin) is a chemotherapy drug. It targets fast dividing cells, like cancer cells, and causes these cells to die. This medicine is used to treat cancers of the colon and rectum, and many other cancers. This medicine may be used for other purposes; ask your health care provider or pharmacist if you have questions. COMMON BRAND NAME(S): Eloxatin What should I tell my health care provider before I take this medicine? They need to know if you have any of these conditions: -kidney disease -an unusual or allergic reaction to oxaliplatin, other chemotherapy, other medicines, foods, dyes, or preservatives -pregnant or trying to get pregnant -breast-feeding How should I use this medicine? This drug is given as an infusion into a vein. It is administered in a hospital or clinic by a specially trained health care professional. Talk to your pediatrician regarding the use of this medicine in children. Special care may be needed. Overdosage: If you think you have taken too much of this medicine contact a poison control center or emergency room at once. NOTE: This medicine is only for you. Do not share this medicine with others. What if I miss a dose? It is important not to miss a dose. Call your doctor or health care professional if you are unable to keep an appointment. What may interact with this medicine? -medicines to increase blood counts like filgrastim, pegfilgrastim, sargramostim -probenecid -some antibiotics like amikacin, gentamicin, neomycin, polymyxin B, streptomycin, tobramycin -zalcitabine Talk to your doctor or health care professional before taking any of these medicines: -acetaminophen -aspirin -ibuprofen -ketoprofen -naproxen This list may not describe all possible interactions. Give your health care provider a list of all the medicines, herbs, non-prescription drugs, or dietary supplements you use. Also tell them if  you smoke, drink alcohol, or use illegal drugs. Some items may interact with your medicine. What should I watch for while using this medicine? Your condition will be monitored carefully while you are receiving this medicine. You will need important blood work done while you are taking this medicine. This medicine can make you more sensitive to cold. Do not drink cold drinks or use ice. Cover exposed skin before coming in contact with cold temperatures or cold objects. When out in cold weather wear warm clothing and cover your mouth and nose to warm the air that goes into your lungs. Tell your doctor if you get sensitive to the cold. This drug may make you feel generally unwell. This is not uncommon, as chemotherapy can affect healthy cells as well as cancer cells. Report any side effects. Continue your course of treatment even though you feel ill unless your doctor tells you to stop. In some cases, you may be given additional medicines to help with side effects. Follow all directions for their use. Call your doctor or health care professional for advice if you get a fever, chills or sore throat, or other symptoms of a cold or flu. Do not treat yourself. This drug decreases your body's ability to fight infections. Try to avoid being around people who are sick. This medicine may increase your risk to bruise or bleed. Call your doctor or health care professional if you notice any unusual bleeding. Be careful brushing and flossing your teeth or using a toothpick because you may get an infection or bleed more easily. If you have any dental work done, tell your dentist you are receiving this medicine. Avoid taking products that contain aspirin, acetaminophen, ibuprofen, naproxen,   or ketoprofen unless instructed by your doctor. These medicines may hide a fever. Do not become pregnant while taking this medicine. Women should inform their doctor if they wish to become pregnant or think they might be pregnant. There  is a potential for serious side effects to an unborn child. Talk to your health care professional or pharmacist for more information. Do not breast-feed an infant while taking this medicine. Call your doctor or health care professional if you get diarrhea. Do not treat yourself. What side effects may I notice from receiving this medicine? Side effects that you should report to your doctor or health care professional as soon as possible: -allergic reactions like skin rash, itching or hives, swelling of the face, lips, or tongue -low blood counts - This drug may decrease the number of white blood cells, red blood cells and platelets. You may be at increased risk for infections and bleeding. -signs of infection - fever or chills, cough, sore throat, pain or difficulty passing urine -signs of decreased platelets or bleeding - bruising, pinpoint red spots on the skin, black, tarry stools, nosebleeds -signs of decreased red blood cells - unusually weak or tired, fainting spells, lightheadedness -breathing problems -chest pain, pressure -cough -diarrhea -jaw tightness -mouth sores -nausea and vomiting -pain, swelling, redness or irritation at the injection site -pain, tingling, numbness in the hands or feet -problems with balance, talking, walking -redness, blistering, peeling or loosening of the skin, including inside the mouth -trouble passing urine or change in the amount of urine Side effects that usually do not require medical attention (report to your doctor or health care professional if they continue or are bothersome): -changes in vision -constipation -hair loss -loss of appetite -metallic taste in the mouth or changes in taste -stomach pain This list may not describe all possible side effects. Call your doctor for medical advice about side effects. You may report side effects to FDA at 1-800-FDA-1088. Where should I keep my medicine? This drug is given in a hospital or clinic and  will not be stored at home. NOTE: This sheet is a summary. It may not cover all possible information. If you have questions about this medicine, talk to your doctor, pharmacist, or health care provider.  2015, [employer]. (2008-07-09 17:22:47) Epirubicin injection What is this medicine? EPIRUBICIN (ep i ROO bi sin) is a chemotherapy drug. This medicine is used to treat breast cancer. This medicine may be used for other purposes; ask your health care provider or pharmacist if you have questions. COMMON BRAND NAME(S): Ellence What should I tell my health care provider before I take this medicine? They need to know if you have any of these conditions: -blood disorders -heart disease, recent heart attack -infection (especially a virus infection such as chickenpox, cold sores, or herpes) -irregular heartbeat -kidney disease -liver disease -recent or ongoing radiation therapy -an unusual or allergic reaction to epirubicin, other chemotherapy agents, other medicines, foods, dyes, or preservatives -pregnant or trying to get pregnant -breast-feeding How should I use this medicine? This drug is given as an infusion into a vein. It is administered in a hospital or clinic by a specially trained health care professional. If you have pain, swelling, burning or any unusual feeling around the site of your injection, tell your health care professional right away. Talk to your pediatrician regarding the use of this medicine in children. Special care may be needed. Overdosage: If you think you have taken too much of this medicine contact a poison  control center or emergency room at once. NOTE: This medicine is only for you. Do not share this medicine with others. What if I miss a dose? It is important not to miss your dose. Call your doctor or health care professional if you are unable to keep an appointment. What may interact with this medicine? Do not take this medicine with any of the  following medications: -cisapride -droperidol -halofantrine -pimozide This medicine may also interact with the following medications: -chloroquine -chlorpromazine -clarithromycin -cimetidine -cyclosporine -erythromycin -medicines for blood pressure like amlodipine, felodipine, nifedipine -medicines for depression, anxiety, or psychotic disturbances -medicines for irregular heart beat like amiodarone, bepridil, dofetilide, encainide, flecainide, propafenone, quinidine -medicines for nausea, vomiting like dolasetron, ondansetron, palonosetron -medicines to increase blood counts like filgrastim, pegfilgrastim, sargramostim -methadone -methotrexate -pentamidine -vaccines Talk to your doctor or health care professional before taking any of these medicines: -acetaminophen -aspirin -ibuprofen -ketoprofen -naproxen This list may not describe all possible interactions. Give your health care provider a list of all the medicines, herbs, non-prescription drugs, or dietary supplements you use. Also tell them if you smoke, drink alcohol, or use illegal drugs. Some items may interact with your medicine. What should I watch for while using this medicine? Your condition will be monitored carefully while you are receiving this medicine. You will need important blood work done while you are taking this medicine. This drug may make you feel generally unwell. This is not uncommon, as chemotherapy can affect healthy cells as well as cancer cells. Report any side effects. Continue your course of treatment even though you feel ill unless your doctor tells you to stop. Your urine may turn red for a few days after your dose. This is not blood. If your urine is dark or brown, call your doctor. In some cases, you may be given additional medicines to help with side effects. Follow all directions for their use. Call your doctor or health care professional for advice if you get a fever, chills or sore throat, or  other symptoms of a cold or flu. Do not treat yourself. This drug decreases your body's ability to fight infections. Try to avoid being around people who are sick. This medicine may increase your risk to bruise or bleed. Call your doctor or health care professional if you notice any unusual bleeding. Be careful brushing and flossing your teeth or using a toothpick because you may get an infection or bleed more easily. If you have any dental work done, tell your dentist you are receiving this medicine. Avoid taking products that contain aspirin, acetaminophen, ibuprofen, naproxen, or ketoprofen unless instructed by your doctor. These medicines may hide a fever. Men and women of childbearing age should use effective birth control methods while using taking this medicine. Do not become pregnant while taking this medicine. There is a potential for serious side effects to an unborn child. Talk to your health care professional or pharmacist for more information. Do not breast-feed an infant while taking this medicine. There is a maximum amount of this medicine you should receive throughout your life. The amount depends on the medical condition being treated and your overall health. Your doctor will watch how much of this medicine you receive in your lifetime. Tell your doctor if you have taken this medicine before. What side effects may I notice from receiving this medicine? Side effects that you should report to your doctor or health care professional as soon as possible: -allergic reactions like skin rash, itching or hives, swelling of  the face, lips, or tongue -low blood counts - this medicine may decrease the number of white blood cells, red blood cells and platelets. You may be at increased risk for infections and bleeding. -signs of infection - fever or chills, cough, sore throat, pain or difficulty passing urine -signs of decreased platelets or bleeding - bruising, pinpoint red spots on the skin, black,  tarry stools, blood in the urine -signs of decreased red blood cells - unusually weak or tired, fainting spells, lightheadedness -breathing problems -chest pain -gout pain -fast, irregular heartbeat -mouth sores -pain, swelling, redness at site where injected -swelling of ankles, feet, or hands Side effects that usually do not require medical attention (report to your doctor or health care professional if they continue or are bothersome): -changes in skin or nail color -diarrhea -hair loss -hot flashes, facial flushing -increased skin sensitivity to the sun -loss of appetite -nausea, vomiting -red colored urine -stomach upset This list may not describe all possible side effects. Call your doctor for medical advice about side effects. You may report side effects to FDA at 1-800-FDA-1088. Where should I keep my medicine? This drug is given in a hospital or clinic and will not be stored at home. NOTE: This sheet is a summary. It may not cover all possible information. If you have questions about this medicine, talk to your doctor, pharmacist, or health care provider.  2015, [employer]. (2013-04-10 09:32:35)

## 2014-08-22 NOTE — Progress Notes (Signed)
Pt denies sx & pt and wife comfortable with d/c. D/C via wheelchair. Wife verbalizes understanding of how to reach on call MD should sx return. Pt received approximately 766ml of 1174mL bag of Oxali. dph

## 2014-08-22 NOTE — Progress Notes (Signed)
Hematology and Oncology Follow Up Visit  MAXWELL LEMEN [id number] 12-26-1951 63 y.o. 08/22/2014   Principle Diagnosis:  Metastatic adenocarcinoma of the distal esophagus  Current Therapy:    Status post cycle 1 of oxaliplatin/epirubicin/Xeloda     Interim History:  Mr.  Nippert is in for followup. He did well with chemotherapy initially. Unfortunately, he had a lot of diarrhea. This started about 2 weeks after treatment. I told him to stop the Xeloda. He is on 1500 mg twice a day. He's been taking some Imodium. This has helped a little bit.  He's lost some weight. He's lost about 14 pounds. However, he feels good. He says he likes the way that he is at.  He is still working.  He had some problems with swallowing initially. We put him on some steroid. Is on Decadron. He says this has helped.  His appetite has been quite good. He's had no nausea vomiting. Apparently no leg swelling. He's had no rashes. He's had no headache. He has lost his hair already.  There's been no cough.  Overall, his performance status is ECOG 1 Medications: Current outpatient prescriptions:aspirin 81 MG tablet, Take 81 mg by mouth daily., Disp: , Rfl: ;  B Complex-C (SUPER B COMPLEX PO), Take 1 tablet by mouth daily., Disp: , Rfl: ;  Cholecalciferol (VITAMIN D) 2000 UNITS tablet, Take 2,000 Units by mouth daily., Disp: , Rfl:  dexamethasone (DECADRON) 4 MG tablet, Take 2 tablets (8 mg total) by mouth 2 (two) times daily with a meal. Start the day after chemotherapy for 3 days., Disp: 90 tablet, Rfl: 2;  diphenoxylate-atropine (LOMOTIL) 2.5-0.025 MG per tablet, Take 1 tablet by mouth 4 (four) times daily as needed for diarrhea or loose stools., Disp: 30 tablet, Rfl: 0 esomeprazole (NEXIUM) 40 MG capsule, Take 40 mg by mouth 2 (two) times daily before a meal., Disp: , Rfl: ;  lansoprazole (PREVACID SOLUTAB) 30 MG disintegrating tablet, Take 30 mg by mouth 2 (two) times daily., Disp: , Rfl: ;  lidocaine-prilocaine  (EMLA) cream, Apply 1 application topically as needed., Disp: 30 g, Rfl: 10 LORazepam (ATIVAN) 0.5 MG tablet, Take 1 tablet (0.5 mg total) by mouth every 6 (six) hours as needed (nausea and vomitting)., Disp: 30 tablet, Rfl: 0;  methimazole (TAPAZOLE) 5 MG tablet, Take 2.5 mg by mouth daily. TAKES 1/2 TAB = 2.5, Disp: , Rfl: ;  ondansetron (ZOFRAN) 8 MG tablet, Take 1 tablet (8 mg total) by mouth 2 (two) times daily. Start the day after chemo for 3 days. Then take as needed for nausea or vomiting., Disp: 30 tablet, Rfl: 1 prochlorperazine (COMPAZINE) 10 MG tablet, Take 1 tablet (10 mg total) by mouth every 6 (six) hours as needed (Nausea or vomiting)., Disp: 30 tablet, Rfl: 1;  propranolol ER (INDERAL LA) 80 MG 24 hr capsule, Take 80 mg by mouth at bedtime., Disp: , Rfl: ;  venlafaxine (EFFEXOR) 37.5 MG tablet, Take 1 tablet (37.5 mg total) by mouth 2 (two) times daily., Disp: 14 tablet, Rfl: 6 capecitabine (XELODA) 500 MG tablet, Take 1,500 mg by mouth 2 (two) times daily after a meal., Disp: , Rfl: ;  fluconazole (DIFLUCAN) 100 MG tablet, Take 1 tablet (100 mg total) by mouth daily., Disp: 30 tablet, Rfl: 4;  potassium chloride SA (K-DUR,KLOR-CON) 20 MEQ tablet, Take 2 tablets twice a day for 5 days, then take 1 tablet twice a day, Disp: 60 tablet, Rfl: 4 sucralfate (CARAFATE) 1 GM/10ML suspension, Take 1 g by mouth  4 (four) times daily -  with meals and at bedtime., Disp: , Rfl:  No current facility-administered medications for this visit. Facility-Administered Medications Ordered in Other Visits: 0.9 %  sodium chloride infusion, , Intravenous, Once, [name], MD;  alteplase (CATHFLO ACTIVASE) injection 2 mg, 2 mg, Intracatheter, Once PRN, [name], MD;  Cold Pack 1 packet, 1 packet, Topical, Once PRN, [name], MD;  heparin lock flush 100 unit/mL, 500 Units, Intracatheter, Once PRN, [name], MD heparin lock flush 100 unit/mL, 250 Units, Intracatheter, Once PRN, [name], MD;  Hot Pack 1 packet, 1 packet, Topical, Once PRN, [name], MD;  oxaliplatin (ELOXATIN) 285 mg in dextrose 5 % 1,000 mL chemo infusion, 130 mg/m2 (Order-Specific), Intravenous, Once, [name], MD, Last Rate: 264 mL/hr at 08/22/14 1124, 285 mg at 08/22/14 1124 potassium chloride SA (K-DUR,KLOR-CON) CR tablet 40 mEq, 40 mEq, Oral, BID, [name], MD, 40 mEq at 08/22/14 0930;  sodium chloride 0.9 % injection 10 mL, 10 mL, Intracatheter, PRN, [name], MD;  sodium chloride 0.9 % injection 3 mL, 3 mL, Intravenous, PRN, [name], MD  Allergies:  Allergies  Allergen Reactions  . Penicillins Other (See Comments)    Hallucinations, from childhood    Past Medical History, Surgical history, Social history, and Family History were reviewed and updated.  Review of Systems: As above  Physical Exam:  oral temperature is 97 F (36.1 C). His blood pressure is 116/60 and his pulse is 69. His respiration is 18.   Fairly well-developed and well-nourished white male. Head and neck exam shows some thrush in the pharynx. He has no mucositis. No obvious adenopathy is noted in the neck. There is no sclera icterus. Lungs are clear. He does arouse wheezes or rhonchi. Cardiac exam regular in rhythm with no murmurs rubs or bruits. Abdomen is soft. Has good bowel sounds. There is no fluid wave. There is no palpable liver or spleen tip. Back exam shows no tenderness over the spine, ribs or hips. Extremities shows no clubbing, cyanosis or edema. He has good muscle strength his legs. Has good range of motion of his joints. Skin exam shows no rashes, ecchymosis or petechia. Neurological exam shows no focal neurological deficits.  Lab Results  Component Value Date   WBC 10.3* 08/22/2014   HGB 13.4 08/22/2014   HCT 39.2 08/22/2014   MCV 78* 08/22/2014   PLT 351 08/22/2014     Chemistry      Component Value Date/Time   NA 131 08/22/2014 0809   NA 138 07/17/2014 1113   K 2.8*  08/22/2014 0809   K 5.1 07/17/2014 1113   CL 94* 08/22/2014 0809   CL 107 07/17/2014 1113   CO2 27 08/22/2014 0809   CO2 28 07/17/2014 1113   BUN 19 08/22/2014 0809   BUN 8 07/17/2014 1113   CREATININE 0.8 08/22/2014 0809   CREATININE 1.0 07/17/2014 1113      Component Value Date/Time   CALCIUM 8.4 08/22/2014 0809   CALCIUM 8.8 07/17/2014 1113   ALKPHOS 36 08/22/2014 0809   ALKPHOS 63 07/17/2014 1113   AST 16 08/22/2014 0809   AST 24 07/17/2014 1113   ALT 17 08/22/2014 0809   ALT 19 07/17/2014 1113   BILITOT 0.90 08/22/2014 0809   BILITOT 1.2 07/17/2014 1113         Impression and Plan: Mr. Radu is a 63 year old gentleman with stage IV adenocarcinoma of  the distal esophagus. We have him on chemotherapy with EOX. Again we held the Xeloda the past week or so.  I'll get him back on to the Xeloda. I'll put him on 1000 mg twice a day.  We will plan for a followup PET scan on him in 2 weeks. This will be after his second cycle of chemotherapy.  I think that he will be able to swallow better. I think that the tumor isn't responding. I will try him off the Decadron. Vital the he's beyond this daily.  His potassium is very low. We will give him 40 mEq of potassium today and when he leaves in from the office. He will then go on 40 mg twice a day for 5 days and then taper down.  We will go ahead and plan to get him back in 3 weeks for his third cycle of treatment.  I spent about 40 minutes with him today.   [name], MD 8/27/20151:21 PM

## 2014-08-23 MED FILL — Fluconazole in NaCl 0.9% Inj 400 MG/200ML: INTRAVENOUS | Qty: 200 | Status: AC

## 2014-08-28 ENCOUNTER — Other Ambulatory Visit: Payer: Self-pay | Admitting: *Deleted

## 2014-08-28 ENCOUNTER — Other Ambulatory Visit (HOSPITAL_BASED_OUTPATIENT_CLINIC_OR_DEPARTMENT_OTHER): Payer: Managed Care, Other (non HMO) | Admitting: Lab

## 2014-08-28 ENCOUNTER — Ambulatory Visit (HOSPITAL_BASED_OUTPATIENT_CLINIC_OR_DEPARTMENT_OTHER): Payer: Managed Care, Other (non HMO)

## 2014-08-28 VITALS — BP 104/78 | HR 83 | Temp 98.1°F | Resp 18

## 2014-08-28 DIAGNOSIS — R5383 Other fatigue: Secondary | ICD-10-CM

## 2014-08-28 DIAGNOSIS — C159 Malignant neoplasm of esophagus, unspecified: Secondary | ICD-10-CM

## 2014-08-28 DIAGNOSIS — C155 Malignant neoplasm of lower third of esophagus: Secondary | ICD-10-CM

## 2014-08-28 DIAGNOSIS — E860 Dehydration: Secondary | ICD-10-CM

## 2014-08-28 DIAGNOSIS — R5381 Other malaise: Secondary | ICD-10-CM

## 2014-08-28 DIAGNOSIS — R197 Diarrhea, unspecified: Secondary | ICD-10-CM

## 2014-08-28 LAB — CMP (CANCER CENTER ONLY)
ALT: 21 U/L (ref 10–47)
AST: 17 U/L (ref 11–38)
Albumin: 2.8 g/dL — ABNORMAL LOW (ref 3.3–5.5)
Alkaline Phosphatase: 31 U/L (ref 26–84)
BILIRUBIN TOTAL: 2 mg/dL — AB (ref 0.20–1.60)
BUN, Bld: 22 mg/dL (ref 7–22)
CO2: 27 meq/L (ref 18–33)
Calcium: 8.4 mg/dL (ref 8.0–10.3)
Chloride: 92 mEq/L — ABNORMAL LOW (ref 98–108)
Creat: 0.7 mg/dl (ref 0.6–1.2)
Glucose, Bld: 119 mg/dL — ABNORMAL HIGH (ref 73–118)
Potassium: 4.1 mEq/L (ref 3.3–4.7)
Sodium: 128 mEq/L (ref 128–145)
Total Protein: 5.1 g/dL — ABNORMAL LOW (ref 6.4–8.1)

## 2014-08-28 LAB — CBC WITH DIFFERENTIAL (CANCER CENTER ONLY)
BASO#: 0 10*3/uL (ref 0.0–0.2)
BASO%: 0 % (ref 0.0–2.0)
EOS%: 0.1 % (ref 0.0–7.0)
Eosinophils Absolute: 0 10*3/uL (ref 0.0–0.5)
HEMATOCRIT: 39.9 % (ref 38.7–49.9)
HGB: 13.6 g/dL (ref 13.0–17.1)
LYMPH#: 1.7 10*3/uL (ref 0.9–3.3)
LYMPH%: 18.6 % (ref 14.0–48.0)
MCH: 26.9 pg — AB (ref 28.0–33.4)
MCHC: 34.1 g/dL (ref 32.0–35.9)
MCV: 79 fL — AB (ref 82–98)
MONO#: 0 10*3/uL — AB (ref 0.1–0.9)
MONO%: 0.4 % (ref 0.0–13.0)
NEUT#: 7.3 10*3/uL — ABNORMAL HIGH (ref 1.5–6.5)
NEUT%: 80.9 % — AB (ref 40.0–80.0)
Platelets: 243 10*3/uL (ref 145–400)
RBC: 5.05 10*6/uL (ref 4.20–5.70)
RDW: 21 % — AB (ref 11.1–15.7)
WBC: 9.1 10*3/uL (ref 4.0–10.0)

## 2014-08-28 MED ORDER — SODIUM CHLORIDE 0.9 % IV SOLN: Freq: Once | INTRAVENOUS | Status: DC

## 2014-08-28 MED ORDER — SODIUM CHLORIDE 0.9 % IV SOLN
Freq: Once | INTRAVENOUS | Status: AC
  Administered 2014-08-28: 12:00:00 via INTRAVENOUS

## 2014-08-28 NOTE — Progress Notes (Signed)
Patient wife called stating patient has been in bed since 3pm yesterday and is very weak, feels dehydrated and aches all over.  Not eating or drinking.  Dr. Marin Olp wants patient to come in for labs and fluids.  appt made for today

## 2014-08-28 NOTE — Patient Instructions (Signed)
Dehydration, Adult Dehydration is when you lose more fluids from the body than you take in. Vital organs like the kidneys, brain, and heart cannot function without a proper amount of fluids and salt. Any loss of fluids from the body can cause dehydration.  CAUSES   Vomiting.  Diarrhea.  Excessive sweating.  Excessive urine output.  Fever. SYMPTOMS  Mild dehydration  Thirst.  Dry lips.  Slightly dry mouth. Moderate dehydration  Very dry mouth.  Sunken eyes.  Skin does not bounce back quickly when lightly pinched and released.  Dark urine and decreased urine production.  Decreased tear production.  Headache. Severe dehydration  Very dry mouth.  Extreme thirst.  Rapid, weak pulse (more than 100 beats per minute at rest).  Cold hands and feet.  Not able to sweat in spite of heat and temperature.  Rapid breathing.  Blue lips.  Confusion and lethargy.  Difficulty being awakened.  Minimal urine production.  No tears. DIAGNOSIS  Your caregiver will diagnose dehydration based on your symptoms and your exam. Blood and urine tests will help confirm the diagnosis. The diagnostic evaluation should also identify the cause of dehydration. TREATMENT  Treatment of mild or moderate dehydration can often be done at home by increasing the amount of fluids that you drink. It is best to drink small amounts of fluid more often. Drinking too much at one time can make vomiting worse. Refer to the home care instructions below. Severe dehydration needs to be treated at the hospital where you will probably be given intravenous (IV) fluids that contain water and electrolytes. HOME CARE INSTRUCTIONS   Ask your caregiver about specific rehydration instructions.  Drink enough fluids to keep your urine clear or pale yellow.  Drink small amounts frequently if you have nausea and vomiting.  Eat as you normally do.  Avoid:  Foods or drinks high in sugar.  Carbonated  drinks.  Juice.  Extremely hot or cold fluids.  Drinks with caffeine.  Fatty, greasy foods.  Alcohol.  Tobacco.  Overeating.  Gelatin desserts.  Wash your hands well to avoid spreading bacteria and viruses.  Only take over-the-counter or prescription medicines for pain, discomfort, or fever as directed by your caregiver.  Ask your caregiver if you should continue all prescribed and over-the-counter medicines.  Keep all follow-up appointments with your caregiver. SEEK MEDICAL CARE IF:  You have abdominal pain and it increases or stays in one area (localizes).  You have a rash, stiff neck, or severe headache.  You are irritable, sleepy, or difficult to awaken.  You are weak, dizzy, or extremely thirsty. SEEK IMMEDIATE MEDICAL CARE IF:   You are unable to keep fluids down or you get worse despite treatment.  You have frequent episodes of vomiting or diarrhea.  You have blood or green matter (bile) in your vomit.  You have blood in your stool or your stool looks black and tarry.  You have not urinated in 6 to 8 hours, or you have only urinated a small amount of very dark urine.  You have a fever.  You faint. MAKE SURE YOU:   Understand these instructions.  Will watch your condition.  Will get help right away if you are not doing well or get worse. Document Released: 12/13/2005 Document Revised: 03/06/2012 Document Reviewed: 08/02/2011 ExitCare Patient Information 2015 [employer]. This information is not intended to replace advice given to you by your health care provider. Make sure you discuss any questions you have with your health care   provider.  

## 2014-08-28 NOTE — Progress Notes (Signed)
Willie Owens comes in for a visit. This is an unscheduled visit. His wife called saying that he was in bed. He is not eating. He's not drinking.  A second cycle of treatment last Thursday. He is on daily Xeloda. I cut his Xeloda dose back to 1000 mg by mouth twice a day.  Overall, he seemed to be doing quite well. He was doing well over the weekend. Then, starting Monday, he began to have problems. He began to get more fatigue. He had more diarrhea. He had no bleeding. He had no fever. Apparently he was put on Diflucan for some oral thrush. This seemed to give her the thrush.  He basically has been in bed since yesterday. Again, he's had diarrhea. This is been somewhat controlled with Lomotil.  I am seeing if he does not have the DPD mutation. We will send off a 5-FU drug toxicity evaluation.  When I examined him, his abdomen is soft. There is no guarding or rebound tenderness. He has decent bowel sounds. There may be little bit decreased. His lungs are clear. Cardiac exam regular in rhythm.  On his labs, they did not look too bad. He is not hypokalemic. He did not hyperglycemic. His white cell count is okay. He's not anemic but I'm sure with IV fluids, his hemoglobin will go down.  We will go ahead and plan to call him on Friday I told him to stop the Xeloda for right now.  I just feel bad that he have a hard time.  He has a little bit of tingling in hands and feet. This might be from the oxaliplatin. We do have him on Effexor to try to help with this.

## 2014-08-30 ENCOUNTER — Other Ambulatory Visit: Payer: Self-pay | Admitting: *Deleted

## 2014-08-30 ENCOUNTER — Other Ambulatory Visit: Payer: Self-pay | Admitting: Hematology & Oncology

## 2014-08-30 MED ORDER — METRONIDAZOLE 500 MG PO TABS: 500.0000 mg | ORAL_TABLET | Freq: Three times a day (TID) | ORAL | Status: DC

## 2014-08-30 NOTE — Telephone Encounter (Signed)
Patient wife called stating that husband still has diarrhea.  Refill approved by Dr. Marin Olp for Lomotil and patient is to take as prescribed.  Patient is to take Lomotil as well.  Patient wife aware of instructions

## 2014-09-01 ENCOUNTER — Encounter: Payer: Self-pay | Admitting: Internal Medicine

## 2014-09-03 ENCOUNTER — Emergency Department (HOSPITAL_COMMUNITY): Payer: Managed Care, Other (non HMO)

## 2014-09-03 ENCOUNTER — Inpatient Hospital Stay (HOSPITAL_COMMUNITY)
Admission: EM | Admit: 2014-09-03 | Discharge: 2014-09-05 | DRG: 070 | Disposition: A | Discharge status: Home / Self Care | Payer: Managed Care, Other (non HMO) | Attending: Family Medicine | Admitting: Family Medicine

## 2014-09-03 ENCOUNTER — Telehealth: Payer: Self-pay | Admitting: *Deleted

## 2014-09-03 ENCOUNTER — Encounter (HOSPITAL_COMMUNITY): Payer: Self-pay | Admitting: Emergency Medicine

## 2014-09-03 ENCOUNTER — Other Ambulatory Visit: Payer: Self-pay | Admitting: *Deleted

## 2014-09-03 DIAGNOSIS — E43 Unspecified severe protein-calorie malnutrition: Secondary | ICD-10-CM | POA: Diagnosis present

## 2014-09-03 DIAGNOSIS — C159 Malignant neoplasm of esophagus, unspecified: Secondary | ICD-10-CM | POA: Diagnosis present

## 2014-09-03 DIAGNOSIS — J449 Chronic obstructive pulmonary disease, unspecified: Secondary | ICD-10-CM | POA: Diagnosis present

## 2014-09-03 DIAGNOSIS — Z9221 Personal history of antineoplastic chemotherapy: Secondary | ICD-10-CM

## 2014-09-03 DIAGNOSIS — I4891 Unspecified atrial fibrillation: Secondary | ICD-10-CM | POA: Diagnosis present

## 2014-09-03 DIAGNOSIS — K219 Gastro-esophageal reflux disease without esophagitis: Secondary | ICD-10-CM | POA: Diagnosis present

## 2014-09-03 DIAGNOSIS — M109 Gout, unspecified: Secondary | ICD-10-CM | POA: Diagnosis present

## 2014-09-03 DIAGNOSIS — Z7982 Long term (current) use of aspirin: Secondary | ICD-10-CM

## 2014-09-03 DIAGNOSIS — N390 Urinary tract infection, site not specified: Secondary | ICD-10-CM | POA: Diagnosis present

## 2014-09-03 DIAGNOSIS — C155 Malignant neoplasm of lower third of esophagus: Secondary | ICD-10-CM | POA: Diagnosis present

## 2014-09-03 DIAGNOSIS — R413 Other amnesia: Secondary | ICD-10-CM | POA: Diagnosis not present

## 2014-09-03 DIAGNOSIS — E0590 Thyrotoxicosis, unspecified without thyrotoxic crisis or storm: Secondary | ICD-10-CM | POA: Diagnosis present

## 2014-09-03 DIAGNOSIS — F05 Delirium due to known physiological condition: Secondary | ICD-10-CM | POA: Diagnosis present

## 2014-09-03 DIAGNOSIS — IMO0002 Reserved for concepts with insufficient information to code with codable children: Secondary | ICD-10-CM | POA: Diagnosis not present

## 2014-09-03 DIAGNOSIS — Z79899 Other long term (current) drug therapy: Secondary | ICD-10-CM | POA: Diagnosis not present

## 2014-09-03 DIAGNOSIS — A498 Other bacterial infections of unspecified site: Secondary | ICD-10-CM | POA: Diagnosis present

## 2014-09-03 DIAGNOSIS — G454 Transient global amnesia: Principal | ICD-10-CM | POA: Diagnosis present

## 2014-09-03 DIAGNOSIS — Z87891 Personal history of nicotine dependence: Secondary | ICD-10-CM | POA: Diagnosis not present

## 2014-09-03 DIAGNOSIS — J4489 Other specified chronic obstructive pulmonary disease: Secondary | ICD-10-CM | POA: Diagnosis present

## 2014-09-03 LAB — COMPREHENSIVE METABOLIC PANEL
ALBUMIN: 2.3 g/dL — AB (ref 3.5–5.2)
ALK PHOS: 36 U/L — AB (ref 39–117)
ALT: 23 U/L (ref 0–53)
ANION GAP: 13 (ref 5–15)
AST: 18 U/L (ref 0–37)
BUN: 19 mg/dL (ref 6–23)
CO2: 23 mEq/L (ref 19–32)
Calcium: 8.6 mg/dL (ref 8.4–10.5)
Chloride: 95 mEq/L — ABNORMAL LOW (ref 96–112)
Creatinine, Ser: 0.76 mg/dL (ref 0.50–1.35)
GFR calc Af Amer: >90 mL/min (ref >90)
GFR calc non Af Amer: >90 mL/min (ref >90)
Glucose, Bld: 121 mg/dL — ABNORMAL HIGH (ref 70–99)
Potassium: 4.6 mEq/L (ref 3.7–5.3)
Sodium: 131 mEq/L — ABNORMAL LOW (ref 137–147)
TOTAL PROTEIN: 5.1 g/dL — AB (ref 6.0–8.3)
Total Bilirubin: 0.5 mg/dL (ref 0.3–1.2)

## 2014-09-03 LAB — URINE MICROSCOPIC-ADD ON

## 2014-09-03 LAB — CBC WITH DIFFERENTIAL/PLATELET
Basophils Absolute: 0 10*3/uL (ref 0.0–0.1)
Basophils Relative: 0 % (ref 0–1)
Eosinophils Absolute: 0 10*3/uL (ref 0.0–0.7)
Eosinophils Relative: 0 % (ref 0–5)
HCT: 36.7 % — ABNORMAL LOW (ref 39.0–52.0)
Hemoglobin: 12.3 g/dL — ABNORMAL LOW (ref 13.0–17.0)
Lymphocytes Relative: 30 % (ref 12–46)
Lymphs Abs: 1.1 10*3/uL (ref 0.7–4.0)
MCH: 26.9 pg (ref 26.0–34.0)
MCHC: 33.5 g/dL (ref 30.0–36.0)
MCV: 80.1 fL (ref 78.0–100.0)
Monocytes Absolute: 0.6 10*3/uL (ref 0.1–1.0)
Monocytes Relative: 17 % — ABNORMAL HIGH (ref 3–12)
Neutro Abs: 1.9 10*3/uL (ref 1.7–7.7)
Neutrophils Relative %: 53 % (ref 43–77)
Platelets: 107 10*3/uL — ABNORMAL LOW (ref 150–400)
RBC: 4.58 MIL/uL (ref 4.22–5.81)
RDW: 21.5 % — ABNORMAL HIGH (ref 11.5–15.5)
WBC Morphology: INCREASED
WBC: 3.6 10*3/uL — ABNORMAL LOW (ref 4.0–10.5)

## 2014-09-03 LAB — URINALYSIS, ROUTINE W REFLEX MICROSCOPIC
GLUCOSE, UA: NEGATIVE mg/dL
Hgb urine dipstick: NEGATIVE
Ketones, ur: NEGATIVE mg/dL
Nitrite: POSITIVE — AB
PH: 6 (ref 5.0–8.0)
Protein, ur: NEGATIVE mg/dL
SPECIFIC GRAVITY, URINE: 1.029 (ref 1.005–1.030)
Urobilinogen, UA: 1 mg/dL (ref 0.0–1.0)

## 2014-09-03 MED ORDER — ONDANSETRON HCL 4 MG/2ML IJ SOLN: 4.0000 mg | Freq: Four times a day (QID) | INTRAMUSCULAR | Status: DC | PRN

## 2014-09-03 MED ORDER — DIPHENOXYLATE-ATROPINE 2.5-0.025 MG PO TABS: 1.0000 | ORAL_TABLET | Freq: Four times a day (QID) | ORAL | Status: DC | PRN

## 2014-09-03 MED ORDER — HEPARIN SODIUM (PORCINE) 5000 UNIT/ML IJ SOLN
5000.0000 [IU] | Freq: Three times a day (TID) | INTRAMUSCULAR | Status: DC
  Administered 2014-09-03 – 2014-09-05 (×5): 5000 [IU] via SUBCUTANEOUS
  Filled 2014-09-03 (×8): qty 1

## 2014-09-03 MED ORDER — SODIUM CHLORIDE 0.9 % IV SOLN
INTRAVENOUS | Status: DC
  Administered 2014-09-03 – 2014-09-04 (×2): via INTRAVENOUS

## 2014-09-03 MED ORDER — DOCUSATE SODIUM 100 MG PO CAPS
100.0000 mg | ORAL_CAPSULE | Freq: Two times a day (BID) | ORAL | Status: DC
  Administered 2014-09-04 – 2014-09-05 (×3): 100 mg via ORAL
  Filled 2014-09-03 (×5): qty 1

## 2014-09-03 MED ORDER — ONDANSETRON HCL 8 MG PO TABS
8.0000 mg | ORAL_TABLET | Freq: Two times a day (BID) | ORAL | Status: DC
  Administered 2014-09-04: 8 mg via ORAL
  Filled 2014-09-03 (×5): qty 1

## 2014-09-03 MED ORDER — VENLAFAXINE HCL 37.5 MG PO TABS
37.5000 mg | ORAL_TABLET | Freq: Two times a day (BID) | ORAL | Status: DC
  Administered 2014-09-04 – 2014-09-05 (×3): 37.5 mg via ORAL
  Filled 2014-09-03 (×5): qty 1

## 2014-09-03 MED ORDER — PANTOPRAZOLE SODIUM 40 MG PO TBEC
80.0000 mg | DELAYED_RELEASE_TABLET | Freq: Every day | ORAL | Status: DC
  Administered 2014-09-04: 80 mg via ORAL
  Filled 2014-09-03 (×2): qty 2

## 2014-09-03 MED ORDER — GADOBENATE DIMEGLUMINE 529 MG/ML IV SOLN
18.0000 mL | Freq: Once | INTRAVENOUS | Status: AC | PRN
  Administered 2014-09-03: 18 mL via INTRAVENOUS

## 2014-09-03 MED ORDER — ASPIRIN 81 MG PO TABS
81.0000 mg | ORAL_TABLET | Freq: Every day | ORAL | Status: DC
  Administered 2014-09-03 – 2014-09-04 (×2): 81 mg via ORAL
  Filled 2014-09-03 (×3): qty 1

## 2014-09-03 MED ORDER — ADULT MULTIVITAMIN W/MINERALS CH
1.0000 | ORAL_TABLET | Freq: Every day | ORAL | Status: DC
  Administered 2014-09-04: 1 via ORAL
  Filled 2014-09-03 (×3): qty 1

## 2014-09-03 MED ORDER — ONDANSETRON HCL 4 MG PO TABS: 4.0000 mg | ORAL_TABLET | Freq: Four times a day (QID) | ORAL | Status: DC | PRN

## 2014-09-03 MED ORDER — LORAZEPAM 0.5 MG PO TABS: 0.5000 mg | ORAL_TABLET | Freq: Four times a day (QID) | ORAL | Status: DC | PRN

## 2014-09-03 MED ORDER — SODIUM CHLORIDE 0.9 % IJ SOLN: 3.0000 mL | Freq: Two times a day (BID) | INTRAMUSCULAR | Status: DC

## 2014-09-03 MED ORDER — FOLIC ACID 1 MG PO TABS
1.0000 mg | ORAL_TABLET | Freq: Every day | ORAL | Status: DC
  Administered 2014-09-03 – 2014-09-04 (×2): 1 mg via ORAL
  Filled 2014-09-03 (×3): qty 1

## 2014-09-03 MED ORDER — DEXAMETHASONE 4 MG PO TABS
8.0000 mg | ORAL_TABLET | Freq: Two times a day (BID) | ORAL | Status: DC
  Administered 2014-09-04 (×2): 8 mg via ORAL
  Filled 2014-09-03 (×3): qty 2

## 2014-09-03 MED ORDER — SUCRALFATE 1 GM/10ML PO SUSP
1.0000 g | Freq: Three times a day (TID) | ORAL | Status: DC
  Administered 2014-09-04 – 2014-09-05 (×5): 1 g via ORAL
  Filled 2014-09-03 (×10): qty 10

## 2014-09-03 MED ORDER — VITAMIN B-1 100 MG PO TABS
100.0000 mg | ORAL_TABLET | Freq: Every day | ORAL | Status: DC
  Administered 2014-09-03 – 2014-09-04 (×2): 100 mg via ORAL
  Filled 2014-09-03 (×3): qty 1

## 2014-09-03 MED ORDER — FLUCONAZOLE 100 MG PO TABS
100.0000 mg | ORAL_TABLET | Freq: Every day | ORAL | Status: DC
Start: 2014-09-03 — End: 2014-09-05
  Administered 2014-09-03 – 2014-09-05 (×3): 100 mg via ORAL
  Filled 2014-09-03 (×3): qty 1

## 2014-09-03 MED ORDER — PROCHLORPERAZINE MALEATE 10 MG PO TABS
10.0000 mg | ORAL_TABLET | Freq: Four times a day (QID) | ORAL | Status: DC | PRN
  Filled 2014-09-03: qty 1

## 2014-09-03 MED ORDER — CETYLPYRIDINIUM CHLORIDE 0.05 % MT LIQD
7.0000 mL | Freq: Two times a day (BID) | OROMUCOSAL | Status: DC
  Administered 2014-09-04 (×2): 7 mL via OROMUCOSAL

## 2014-09-03 MED ORDER — CIPROFLOXACIN IN D5W 400 MG/200ML IV SOLN
400.0000 mg | Freq: Once | INTRAVENOUS | Status: AC
  Administered 2014-09-03: 400 mg via INTRAVENOUS
  Filled 2014-09-03: qty 200

## 2014-09-03 MED ORDER — PROPRANOLOL HCL ER 80 MG PO CP24
80.0000 mg | ORAL_CAPSULE | Freq: Every day | ORAL | Status: DC
  Administered 2014-09-03 – 2014-09-04 (×2): 80 mg via ORAL
  Filled 2014-09-03 (×3): qty 1

## 2014-09-03 MED ORDER — METHIMAZOLE 5 MG PO TABS
2.5000 mg | ORAL_TABLET | Freq: Every day | ORAL | Status: DC
  Administered 2014-09-03 – 2014-09-04 (×2): 2.5 mg via ORAL
  Filled 2014-09-03 (×3): qty 1

## 2014-09-03 MED ORDER — METRONIDAZOLE 500 MG PO TABS
500.0000 mg | ORAL_TABLET | Freq: Three times a day (TID) | ORAL | Status: DC
  Administered 2014-09-03 – 2014-09-05 (×5): 500 mg via ORAL
  Filled 2014-09-03 (×7): qty 1

## 2014-09-03 NOTE — H&P (Signed)
[facility] History and Physical  [name] PNT:[id number] DOB: 07-Sep-1951 DOA: 09/03/2014  Referring physician: Davonna Belling, Md PCP: Chesley Noon, MD   Chief Complaint: Memory Loss  HPI: Willie Owens is a 63 y.o. male presents with an acute confusional state. He has a diagnosis of esophageal cancer and has recently been stopped on his chemotherapy. He was seen in the office recently and had started to have increased confusion. Patients oncologist stopped his Xeloda. He had been having some diarrhea noted at that time. Patient apparently has memory loss for short term events. He states he did not know he has cancer. Patients findings are inconsistent however as to how far of a memory loss he has and it seems to be back before the cancer diagnosis he can remember but after the diagnosis he does not remember. However even this is not consistent as I asked him about his grand kids and he states he did not know he had grand kids and than he remembered who the kids belonged to. He has no headaches no seizures. He has no CP noted. He has no syncope noted. Patient has no prior stroke history. He does have a ETOH use history with a history of withdrawal in the past.   Review of Systems:  Patient is not able to provide a ROS as he is not able to answer questions.  Past Medical History  Diagnosis Date  . Atrial fibrillation     with RVR while hospitalized in 2014  . Hyperthyroidism     thyrotoxicosis , graves  . H/O [facility] spotted fever   . Gout   . History of antinuclear antibodies   . GERD (gastroesophageal reflux disease)   . Cancer 06/2014    esopageal ca-7/15  . Esophageal cancer 07/26/2014   Past Surgical History  Procedure Laterality Date  . Leg surgery      Fx, rod removed  . Transthoracic echocardiogram  4/292014    EF 55-60%; severe bi-atrial enlargement; RV mod dilated  . Cardioversion N/A 09/21/2013    Procedure: CARDIOVERSION;  Surgeon: Pixie Casino, MD;  [facility];  Service: Cardiovascular;  Laterality: N/A;  . Tonsillectomy and adenoidectomy     Social History:  reports that he quit smoking about 9 years ago. His smoking use included Cigarettes. He started smoking about 43 years ago. He has a 68 pack-year smoking history. He has never used smokeless tobacco. He reports that he drinks about 1.5 ounces of alcohol per week. He reports that he does not use illicit drugs.  Allergies  Allergen Reactions  . Penicillins Other (See Comments)    Hallucinations, from childhood    Family History  Problem Relation Age of Onset  . Pulmonary embolism Neg Hx   . CAD Neg Hx   . Colon cancer Neg Hx   . Stomach cancer Neg Hx   . Esophageal cancer Neg Hx   . Diabetes Mother      Prior to Admission medications   Medication Sig Start Date End Date Taking? Authorizing Provider  aspirin 81 MG tablet Take 81 mg by mouth daily.   Yes Historical Provider, MD  B Complex-C (SUPER B COMPLEX PO) Take 1 tablet by mouth daily.   Yes Historical Provider, MD  Cholecalciferol (VITAMIN D) 2000 UNITS tablet Take 2,000 Units by mouth daily.   Yes Historical Provider, MD  dexamethasone (DECADRON) 4 MG tablet Take 2 tablets (8 mg total) by mouth 2 (two) times daily with  a meal. Start the day after chemotherapy for 3 days. 08/22/14  Yes [name], MD  diphenoxylate-atropine (LOMOTIL) 2.5-0.025 MG per tablet Take 1 tablet by mouth 4 (four) times daily as needed for diarrhea or loose stools.   Yes Historical Provider, MD  esomeprazole (NEXIUM) 40 MG capsule Take 40 mg by mouth 2 (two) times daily before a meal.   Yes Historical Provider, MD  fluconazole (DIFLUCAN) 100 MG tablet Take 1 tablet (100 mg total) by mouth daily. 08/22/14  Yes [name], MD  lidocaine-prilocaine (EMLA) cream Apply 1 application topically as needed. 07/31/14  Yes [name], MD  LORazepam (ATIVAN) 0.5 MG tablet Take 1 tablet (0.5 mg total) by mouth every 6 (six)  hours as needed (nausea and vomitting). 07/31/14  Yes [name], MD  methimazole (TAPAZOLE) 5 MG tablet Take 2.5 mg by mouth daily. TAKES 1/2 TAB = 2.5 06/19/14  Yes Philemon Kingdom, MD  metroNIDAZOLE (FLAGYL) 500 MG tablet Take 1 tablet (500 mg total) by mouth 3 (three) times daily. Take 1 tablet by mouth 3 times daily x 7 days. 08/30/14  Yes [name], MD  ondansetron (ZOFRAN) 8 MG tablet Take 1 tablet (8 mg total) by mouth 2 (two) times daily. Start the day after chemo for 3 days. Then take as needed for nausea or vomiting. 07/31/14  Yes [name], MD  potassium chloride SA (K-DUR,KLOR-CON) 20 MEQ tablet Take 2 tablets twice a day for 5 days, then take 1 tablet twice a day 08/22/14  Yes [name], MD  PRESCRIPTION MEDICATION Chemotherapy at [facility]   Yes Historical Provider, MD  propranolol ER (INDERAL LA) 80 MG 24 hr capsule Take 80 mg by mouth at bedtime.   Yes Historical Provider, MD  sucralfate (CARAFATE) 1 GM/10ML suspension Take 1 g by mouth 4 (four) times daily -  with meals and at bedtime.   Yes Historical Provider, MD  venlafaxine (EFFEXOR) 37.5 MG tablet Take 1 tablet (37.5 mg total) by mouth 2 (two) times daily with a meal. 08/22/14  Yes [name], MD  lansoprazole (PREVACID SOLUTAB) 30 MG disintegrating tablet Take 30 mg by mouth 2 (two) times daily.    Historical Provider, MD  prochlorperazine (COMPAZINE) 10 MG tablet Take 1 tablet (10 mg total) by mouth every 6 (six) hours as needed (Nausea or vomiting). 07/31/14   [name], MD   Physical Exam: Filed Vitals:   09/03/14 1353 09/03/14 1944  BP: 111/61 106/70  Pulse: 91 63  Temp: 98 F (36.7 C) 98.1 F (36.7 C)  TempSrc: Oral Oral  Resp: 14 18  Height: 6\' 6"  (1.981 m)   Weight: 88.451 kg (195 lb)   SpO2: 97% 94%    Wt Readings from Last 3 Encounters:  09/03/14 88.451 kg (195 lb)  07/25/14 94.802 kg (209 lb)  07/18/14 94.802 kg (209 lb)    General:  Appears calm and confused Eyes: PERRL, normal  lids, irises & conjunctiva ENT: grossly normal hearing, lips & tongue Neck: no LAD, masses or thyromegaly Cardiovascular: RRR, no m/r/g. No LE edema. Respiratory: CTA bilaterally, no w/r/r. Normal respiratory effort. Abdomen: soft, ntnd Skin: no rash or induration seen on limited exam Musculoskeletal: grossly normal tone BUE/BLE Psychiatric: grossly flat affect, speech not appropriate Neurologic: grossly non-focal. Except for memory loss          Labs on Admission:  Basic Metabolic Panel:  Recent Labs Lab 08/28/14 1151 09/03/14 1554  NA  128 131*  K 4.1 4.6  CL 92* 95*  CO2 27 23  GLUCOSE 119* 121*  BUN 22 19  CREATININE 0.7 0.76  CALCIUM 8.4 8.6   Liver Function Tests:  Recent Labs Lab 08/28/14 1151 09/03/14 1554  AST 17 18  ALT 21 23  ALKPHOS 31 36*  BILITOT 2.00* 0.5  PROT 5.1* 5.1*  ALBUMIN  --  2.3*   No results found for this basename: LIPASE, AMYLASE,  in the last 168 hours No results found for this basename: AMMONIA,  in the last 168 hours CBC:  Recent Labs Lab 08/28/14 1150 09/03/14 1554  WBC 9.1 3.6*  NEUTROABS 7.3* 1.9  HGB 13.6 12.3*  HCT 39.9 36.7*  MCV 79* 80.1  PLT 243 107*   Cardiac Enzymes: No results found for this basename: CKTOTAL, CKMB, CKMBINDEX, TROPONINI,  in the last 168 hours  BNP (last 3 results) No results found for this basename: PROBNP,  in the last 8760 hours CBG: No results found for this basename: GLUCAP,  in the last 168 hours  Radiological Exams on Admission: Mr Jeri Cos Wo Contrast  09/03/2014   CLINICAL DATA:  63 year old male with altered mental status, memory impairment. Initial encounter. Metastatic Esophageal cancer.  EXAM: MRI HEAD WITHOUT AND WITH CONTRAST  TECHNIQUE: Multiplanar, multiecho pulse sequences of the brain and surrounding structures were obtained without and with intravenous contrast.  CONTRAST:  70mL MULTIHANCE GADOBENATE DIMEGLUMINE 529 MG/ML IV SOLN  COMPARISON:  Brain MRI without and with  contrast 07/27/2014.  FINDINGS: Major intracranial vascular flow voids are stable. No restricted diffusion to suggest acute infarction. No midline shift, mass effect, evidence of mass lesion, ventriculomegaly, extra-axial collection or acute intracranial hemorrhage. Cervicomedullary junction and pituitary are within normal limits.  No abnormal enhancement identified. Developmental venous anomaly in the cerebellar hemispheres re- identified. Grossly normal visualized cervical spine, with normal visible bone marrow signal. Pearline Cables and white matter signal is stable and within normal limits for age. Visible internal auditory structures appear normal. Visualized paranasal sinuses and mastoids are clear. Visualized orbit soft tissues are within normal limits. Visualized scalp soft tissues are within normal limits.  IMPRESSION: Stable and negative for age, with no acute or metastatic intracranial abnormality.   Electronically Signed   By: Lars Pinks M.D.   On: 09/03/2014 20:01     Assessment/Plan Principal Problem:   Transient global amnesia Active Problems:   COPD (chronic obstructive pulmonary disease)   Esophageal cancer   Acute confusional state   1. Acute confusional state -unclear etiology -initial workup is negative -he does have a history of ETOH use and possibly related to this? -will monitor for now -neurology consult  2. COPD -currently is stable -will use inhalers prn  3. Esophageal cancer -followed by oncology -Chemo was put on hold based on the last office visit    Code Status: Full Code (must indicate code status--if unknown or must be presumed, indicate so) DVT Prophylaxis:Heparin Family Communication: Wife and son (indicate person spoken with, if applicable, with phone number if by telephone) Disposition Plan: Home (indicate anticipated LOS)  Time spent: 15min  KHAN,SAADAT A [facility] Pager [phone]  **Disclaimer: This note may have been dictated with voice  recognition software. Similar sounding words can inadvertently be transcribed and this note may contain transcription errors which may not have been corrected upon publication of note.**

## 2014-09-03 NOTE — ED Notes (Signed)
Patient transported to MRI 

## 2014-09-03 NOTE — ED Provider Notes (Signed)
CSN: [id number]     Arrival date & time 09/03/14  1339 History   First MD Initiated Contact with Patient 09/03/14 1501     Chief Complaint  Patient presents with  . Altered Mental Status   Level V caveat due to altered mental status.  (Consider location/radiation/quality/duration/timing/severity/associated sxs/prior Treatment) Patient is a 63 y.o. male presenting with altered mental status. The history is provided by the patient and a relative.  Altered Mental Status Presenting symptoms: confusion   Associated symptoms: no headaches and no light-headedness    patient has been somewhat fatigued with diarrhea over the last week. Again today his been having severe memory issues. He did not remember that he took a shower immediately after the shower. He does not remember that he is cancer. He is without complaints. He states he does not want to be here. He has esophageal cancer that was diagnosed around 2 months ago and has been on chemotherapy.  Past Medical History  Diagnosis Date  . Atrial fibrillation     with RVR while hospitalized in 2014  . Hyperthyroidism     thyrotoxicosis , graves  . H/O [facility] spotted fever   . Gout   . History of antinuclear antibodies   . GERD (gastroesophageal reflux disease)   . Cancer 06/2014    esopageal ca-7/15  . Esophageal cancer 07/26/2014   Past Surgical History  Procedure Laterality Date  . Leg surgery      Fx, rod removed  . Transthoracic echocardiogram  4/292014    EF 55-60%; severe bi-atrial enlargement; RV mod dilated  . Cardioversion N/A 09/21/2013    Procedure: CARDIOVERSION;  Surgeon: Pixie Casino, MD;  Location: [facility] ENDOSCOPY;  Service: Cardiovascular;  Laterality: N/A;  . Tonsillectomy and adenoidectomy     Family History  Problem Relation Age of Onset  . Pulmonary embolism Neg Hx   . CAD Neg Hx   . Colon cancer Neg Hx   . Stomach cancer Neg Hx   . Esophageal cancer Neg Hx   . Diabetes Mother    History  Substance  Use Topics  . Smoking status: Former Smoker -- 2.00 packs/day for 34 years    Types: Cigarettes    Start date: 03/19/1971    Quit date: 12/30/2004  . Smokeless tobacco: Never Used     Comment: quit smoking 9 years ago  . Alcohol Use: 1.5 oz/week    3 drink(s) per week     Comment: vodka daily    Review of Systems  Unable to perform ROS Neurological: Negative for speech difficulty, light-headedness and headaches.  Psychiatric/Behavioral: Positive for confusion.      Allergies  Penicillins  Home Medications   Prior to Admission medications   Medication Sig Start Date End Date Taking? Authorizing Provider  aspirin 81 MG tablet Take 81 mg by mouth daily.   Yes Historical Provider, MD  B Complex-C (SUPER B COMPLEX PO) Take 1 tablet by mouth daily.   Yes Historical Provider, MD  Cholecalciferol (VITAMIN D) 2000 UNITS tablet Take 2,000 Units by mouth daily.   Yes Historical Provider, MD  dexamethasone (DECADRON) 4 MG tablet Take 2 tablets (8 mg total) by mouth 2 (two) times daily with a meal. Start the day after chemotherapy for 3 days. 08/22/14  Yes [name], MD  diphenoxylate-atropine (LOMOTIL) 2.5-0.025 MG per tablet Take 1 tablet by mouth 4 (four) times daily as needed for diarrhea or loose stools.   Yes Historical Provider, MD  esomeprazole (NEXIUM) 40 MG capsule Take 40 mg by mouth 2 (two) times daily before a meal.   Yes Historical Provider, MD  fluconazole (DIFLUCAN) 100 MG tablet Take 1 tablet (100 mg total) by mouth daily. 08/22/14  Yes [name], MD  lidocaine-prilocaine (EMLA) cream Apply 1 application topically as needed. 07/31/14  Yes [name], MD  LORazepam (ATIVAN) 0.5 MG tablet Take 1 tablet (0.5 mg total) by mouth every 6 (six) hours as needed (nausea and vomitting). 07/31/14  Yes [name], MD  methimazole (TAPAZOLE) 5 MG tablet Take 2.5 mg by mouth daily. TAKES 1/2 TAB = 2.5 06/19/14  Yes Philemon Kingdom, MD  metroNIDAZOLE (FLAGYL) 500 MG tablet  Take 1 tablet (500 mg total) by mouth 3 (three) times daily. Take 1 tablet by mouth 3 times daily x 7 days. 08/30/14  Yes [name], MD  ondansetron (ZOFRAN) 8 MG tablet Take 1 tablet (8 mg total) by mouth 2 (two) times daily. Start the day after chemo for 3 days. Then take as needed for nausea or vomiting. 07/31/14  Yes [name], MD  potassium chloride SA (K-DUR,KLOR-CON) 20 MEQ tablet Take 2 tablets twice a day for 5 days, then take 1 tablet twice a day 08/22/14  Yes [name], MD  PRESCRIPTION MEDICATION Chemotherapy at [facility]   Yes Historical Provider, MD  propranolol ER (INDERAL LA) 80 MG 24 hr capsule Take 80 mg by mouth at bedtime.   Yes Historical Provider, MD  sucralfate (CARAFATE) 1 GM/10ML suspension Take 1 g by mouth 4 (four) times daily -  with meals and at bedtime.   Yes Historical Provider, MD  venlafaxine (EFFEXOR) 37.5 MG tablet Take 1 tablet (37.5 mg total) by mouth 2 (two) times daily with a meal. 08/22/14  Yes [name], MD  lansoprazole (PREVACID SOLUTAB) 30 MG disintegrating tablet Take 30 mg by mouth 2 (two) times daily.    Historical Provider, MD  prochlorperazine (COMPAZINE) 10 MG tablet Take 1 tablet (10 mg total) by mouth every 6 (six) hours as needed (Nausea or vomiting). 07/31/14   [name], MD   BP 119/71  Pulse 78  Temp(Src) 97.6 F (36.4 C) (Oral)  Resp 18  Ht 6\' 6"  (1.981 m)  Wt 182 lb 12.8 oz (82.918 kg)  BMI 21.13 kg/m2  SpO2 97% Physical Exam  Constitutional: He appears well-developed and well-nourished.  HENT:  Head: Normocephalic.  Eyes: EOM are normal. Pupils are equal, round, and reactive to light.  Cardiovascular: Normal rate and regular rhythm.   Pulmonary/Chest: Effort normal and breath sounds normal.  Port-A-Cath to right chest wall  Abdominal: Soft.  Musculoskeletal: Normal range of motion.  Neurological: He is alert.  Patient is able to identify himself. He recognizes family members. He does not know why is in the  hospital. He does not remember that he is cancer. He is able to move all extremities equally. He is able to remember 3 items immediately and at 10 minutes.  Skin: Skin is warm.    ED Course  Procedures (including critical care time) Labs Review Labs Reviewed  CBC WITH DIFFERENTIAL - Abnormal; Notable for the following:    WBC 3.6 (*)    Hemoglobin 12.3 (*)    HCT 36.7 (*)    RDW 21.5 (*)    Platelets 107 (*)    Monocytes Relative 17 (*)    All other components within normal limits  COMPREHENSIVE METABOLIC PANEL - Abnormal;  Notable for the following:    Sodium 131 (*)    Chloride 95 (*)    Glucose, Bld 121 (*)    Total Protein 5.1 (*)    Albumin 2.3 (*)    Alkaline Phosphatase 36 (*)    All other components within normal limits  URINALYSIS, ROUTINE W REFLEX MICROSCOPIC - Abnormal; Notable for the following:    Color, Urine RED (*)    APPearance CLOUDY (*)    Bilirubin Urine SMALL (*)    Nitrite POSITIVE (*)    Leukocytes, UA SMALL (*)    All other components within normal limits  URINE MICROSCOPIC-ADD ON - Abnormal; Notable for the following:    Bacteria, UA FEW (*)    Crystals CA OXALATE CRYSTALS (*)    All other components within normal limits  URINE CULTURE  COMPREHENSIVE METABOLIC PANEL  CBC  TSH  HEMOGLOBIN A1C    Imaging Review Mr Kizzie Fantasia Contrast  09/03/2014   CLINICAL DATA:  63 year old male with altered mental status, memory impairment. Initial encounter. Metastatic Esophageal cancer.  EXAM: MRI HEAD WITHOUT AND WITH CONTRAST  TECHNIQUE: Multiplanar, multiecho pulse sequences of the brain and surrounding structures were obtained without and with intravenous contrast.  CONTRAST:  6mL MULTIHANCE GADOBENATE DIMEGLUMINE 529 MG/ML IV SOLN  COMPARISON:  Brain MRI without and with contrast 07/27/2014.  FINDINGS: Major intracranial vascular flow voids are stable. No restricted diffusion to suggest acute infarction. No midline shift, mass effect, evidence of mass lesion,  ventriculomegaly, extra-axial collection or acute intracranial hemorrhage. Cervicomedullary junction and pituitary are within normal limits.  No abnormal enhancement identified. Developmental venous anomaly in the cerebellar hemispheres re- identified. Grossly normal visualized cervical spine, with normal visible bone marrow signal. Pearline Cables and white matter signal is stable and within normal limits for age. Visible internal auditory structures appear normal. Visualized paranasal sinuses and mastoids are clear. Visualized orbit soft tissues are within normal limits. Visualized scalp soft tissues are within normal limits.  IMPRESSION: Stable and negative for age, with no acute or metastatic intracranial abnormality.   Electronically Signed   By: Lars Pinks M.D.   On: 09/03/2014 20:01     EKG Interpretation None      MDM   Final diagnoses:  Transient global amnesia  Esophageal cancer  UTI (lower urinary tract infection)    Patient with mental status changes. Appears to be memory related. Does not remember that he has cancer. Does have recent stressful event. MRI reassuring. Although he initially did remember the items I gave him for memory, he later forgot both me and the items. Repeatedly asking family members with is here. Maybe transient global amnesia. Discussed with neurology, who states he will see the patient. Will admit to internal medicine.    Jasper Riling. Alvino Chapel, MD 09/04/14 0000

## 2014-09-03 NOTE — Telephone Encounter (Signed)
Received few phone calls from patient wife Almyra Free stating that patient mental status has been different past few days.  Seems to be worsening every day.  Today patient couldn't remember his kids or who his best friend was.  Also just got out of shower and couldn't remember if he showered.  Patient wife very concerned and teary.  Has worsened as day goes along.  Dr. Marin Olp wants patient to go to ED at [facility] to evaluate patient status.  Wife aware and on way to hospital

## 2014-09-03 NOTE — Progress Notes (Signed)
Pt transported to floor via stretcher and ambulated to bed with little assistance. No distress noted at this time. VSS. Wife at bedside, belongings at bedside. Will continue to monitor pt closely. Carnella Guadalajara I

## 2014-09-03 NOTE — ED Notes (Signed)
Per family, since last Thursday, pt has been having AMS.  Worse today.  Pt has esophageal and lymph node cancer/tumors.  Pt is under care of Dr. Marin Olp.  Last MRI of brain was August 1st.  Dr. Marin Olp told pt to come here today d/t AMS

## 2014-09-03 NOTE — ED Notes (Signed)
Bed: WA12 Expected date:  Expected time:  Means of arrival:  Comments: EMS abd pain 

## 2014-09-04 DIAGNOSIS — C159 Malignant neoplasm of esophagus, unspecified: Secondary | ICD-10-CM

## 2014-09-04 DIAGNOSIS — I4891 Unspecified atrial fibrillation: Secondary | ICD-10-CM

## 2014-09-04 DIAGNOSIS — F05 Delirium due to known physiological condition: Secondary | ICD-10-CM

## 2014-09-04 DIAGNOSIS — E43 Unspecified severe protein-calorie malnutrition: Secondary | ICD-10-CM | POA: Insufficient documentation

## 2014-09-04 DIAGNOSIS — C155 Malignant neoplasm of lower third of esophagus: Secondary | ICD-10-CM

## 2014-09-04 DIAGNOSIS — C801 Malignant (primary) neoplasm, unspecified: Secondary | ICD-10-CM

## 2014-09-04 LAB — COMPREHENSIVE METABOLIC PANEL
ALK PHOS: 35 U/L — AB (ref 39–117)
ALT: 21 U/L (ref 0–53)
AST: 16 U/L (ref 0–37)
Albumin: 2.2 g/dL — ABNORMAL LOW (ref 3.5–5.2)
Anion gap: 9 (ref 5–15)
BUN: 22 mg/dL (ref 6–23)
CHLORIDE: 95 meq/L — AB (ref 96–112)
CO2: 26 meq/L (ref 19–32)
Calcium: 8.2 mg/dL — ABNORMAL LOW (ref 8.4–10.5)
Creatinine, Ser: 0.79 mg/dL (ref 0.50–1.35)
GLUCOSE: 95 mg/dL (ref 70–99)
POTASSIUM: 4 meq/L (ref 3.7–5.3)
Sodium: 130 mEq/L — ABNORMAL LOW (ref 137–147)
Total Bilirubin: 0.5 mg/dL (ref 0.3–1.2)
Total Protein: 4.7 g/dL — ABNORMAL LOW (ref 6.0–8.3)

## 2014-09-04 LAB — RAPID URINE DRUG SCREEN, HOSP PERFORMED
Amphetamines: NOT DETECTED
BENZODIAZEPINES: NOT DETECTED
Barbiturates: NOT DETECTED
COCAINE: NOT DETECTED
Opiates: NOT DETECTED
TETRAHYDROCANNABINOL: POSITIVE — AB

## 2014-09-04 LAB — CBC
HCT: 34.6 % — ABNORMAL LOW (ref 39.0–52.0)
Hemoglobin: 11.3 g/dL — ABNORMAL LOW (ref 13.0–17.0)
MCH: 26.8 pg (ref 26.0–34.0)
MCHC: 32.7 g/dL (ref 30.0–36.0)
MCV: 82 fL (ref 78.0–100.0)
PLATELETS: 100 10*3/uL — AB (ref 150–400)
RBC: 4.22 MIL/uL (ref 4.22–5.81)
RDW: 21.8 % — ABNORMAL HIGH (ref 11.5–15.5)
WBC: 5.4 10*3/uL (ref 4.0–10.5)

## 2014-09-04 LAB — HEMOGLOBIN A1C
Hgb A1c MFr Bld: 6 % — ABNORMAL HIGH (ref <5.7)
Mean Plasma Glucose: 126 mg/dL — ABNORMAL HIGH (ref <117)

## 2014-09-04 LAB — TSH: TSH: 4.47 u[IU]/mL (ref 0.350–4.500)

## 2014-09-04 LAB — VITAMIN B12: VITAMIN B 12: 909 pg/mL (ref 211–911)

## 2014-09-04 LAB — GLUCOSE, CAPILLARY: Glucose-Capillary: 78 mg/dL (ref 70–99)

## 2014-09-04 MED ORDER — SODIUM CHLORIDE 0.9 % IJ SOLN: 10.0000 mL | INTRAMUSCULAR | Status: DC | PRN

## 2014-09-04 MED ORDER — BOOST PLUS PO LIQD
237.0000 mL | Freq: Two times a day (BID) | ORAL | Status: DC
  Administered 2014-09-04: 237 mL via ORAL
  Filled 2014-09-04 (×3): qty 237

## 2014-09-04 NOTE — Consult Note (Signed)
#   [id number] is the consult note. I have never seen this type of neurological process with esophageal cancer. I've never seen the chemotherapy that he is on cause his current problems.  Neurology is following him.  We will see what some of the other studies could show.  The only possible medication that he is on that might be a "culprit" is the oxaliplatin. Again, I will take it this was the case, that it would be incredibly rare.  We will follow along. I talked to his wife at length this morning. I would have to think that this will get better.  I very much appreciate the outstanding care that he is getting from the hospitalist and the consultants.  Frederich Cha 1:5-7

## 2014-09-04 NOTE — Consult Note (Signed)
NAMETYSHUN, TUCKERMAN NO.:  [id number]  MEDICAL RECORD NO.:  [id number]  LOCATION:  [id number]                         FACILITY:  [facility]  PHYSICIAN:  [name], M.D.  DATE OF BIRTH:  1951/06/20  DATE OF CONSULTATION:  09/04/2014 DATE OF DISCHARGE:                                CONSULTATION   REASON FOR CONSULTATION: 1. Metastatic adenocarcinoma of the distal esophagus. 2. Global amnesia.  HISTORY OF PRESENT ILLNESS:  Mr. Vanaken is a nice 63 year old gentleman.  He is well known to me.  He was recently diagnosed with metastatic adenocarcinoma of the distal esophagus.  He does not have brain mets.  He has been on systemic chemotherapy.  He has had 2 cycles of chemotherapy with Xeloda/oxaliplatin/epirubicin.  He had his last cycle of chemotherapy about 10 days ago.  He has been having issues with diarrhea.  We went ahead and stopped the Xeloda.  His wife called yesterday saying that he had worsening in mental status. He did not know that he had a cancer.  He did not know where it was.  He really cannot give her any kind of information.  She went to the emergency room with him.  He had an MRI of the brain. The MRI did not show any obvious metastatic disease to the brain.  His last MRI of the brain was done back in early August.  This also looked okay.  He had lab work done in the ER.  He had a normal sodium.  Calcium was okay.  He had normal BUN and creatinine.  His glucose was okay.  Liver function tests were all right.  He was felt to have urinary tract infection.  He is on ciprofloxacin right now.  Again, he has absolutely no memory of him having cancer.  He did not know who I was.  He was seen by Neurology.  They recommended an EEG.  They also recommend other studies.  They felt he had transient global amnesia.  Hopefully, this will improve.  We are seeing him to try to help out with any type of oncologic issues.  PAST MEDICAL HISTORY:   Remarkable. 1. Past atrial fibrillation. 2. Hyperthyroidism. 3. GERD. 4. History of [place] spotted fever.  ALLERGIES:  Penicillin.  HOME MEDICATIONS: 1. Aspirin 81 mg p.o. daily. 2. Decadron 8 mg p.o. b.i.d. for 3 days after each chemotherapy. 3. Lomotil 1 p.o. q.i.d. p.r.n. for diarrhea. 4. Nexium 40 mg p.o. b.i.d. 5. Diflucan 100 mg p.o. daily. 6. Ativan 0.5 mg p.o. q.6 hours p.r.n. for nausea and vomiting. 7. Tapazole 2.5 mg p.o. daily. 8. Flagyl 500 mg p.o. t.i.d. x7 days. 9. Zofran 8 mg p.o. b.i.d. p.r.n. 10.Inderal LA 80 mg p.o. daily. 11.Carafate elixir 1 g p.o. q.i.d. 12.Effexor 37.5 mg p.o. b.i.d. 13.Compazine 10 mg p.o. q.6 hours p.r.n.  SOCIAL HISTORY:  Remarkable for past tobacco use.  There is no significant alcohol use.  FAMILY HISTORY:  Pretty much noncontributory.  PHYSICAL EXAMINATION:  GENERAL:  This is a fairly well-developed, well- nourished white gentleman, in no obvious distress.  He looks alert. Again, he is not oriented to person, place or time. VITAL SIGNS:  Show  a temperature of 97.3, pulse 61, blood pressure 107/56, and weight is 182 pounds. HEAD AND NECK:  Normocephalic, atraumatic skull.  There are no ocular or oral lesions.  There are no palpable cervical or supraclavicular lymph nodes.  Pupils react appropriately. LUNGS:  Clear. CARDIAC:  Regular rate and rhythm with no murmurs, rubs or bruits. ABDOMEN:  Soft.  He has good bowel sounds.  There is no fluid wave. There is no palpable liver or spleen tip. BACK:  No tenderness over the spine ribs or hips. EXTREMITIES:  Show no clubbing, cyanosis or edema.  He has good range motion of his joints.  He has good strength bilaterally. NEUROLOGICAL:  No focal neurological deficits.  Again, __________ no memory.  LABORATORY STUDIES:  White cell count 5.4, hemoglobin 11.3, hematocrit 34.6, and platelet count 100,000.  Sodium 130, potassium 4.0, BUN 22, and creatinine 0.79.  Calcium is 8.2  with an albumin of 2.1.  Liver function tests are normal.  IMPRESSION:  Mr. Benally is a nice 63 year old gentleman.  He has metastatic adenocarcinoma of the distal esophagus.  He has had 2 cycles of chemotherapy.  He was set up for his followup PET scan to be done next week.  This was to see how well he is responded.  I actually have no clue as to why he has this global amnesia.  I have never seen this with his chemotherapy.  The only thing I could possibly consider would be the oxaliplatin.  Again, I do not think that there is a problem with what he is taking that would be causing his neurological issues.  The MRI and looked okay.  There is no suggestion of any type of carcinomatous spread of malignancy.  I do not think he needs to have a spinal tap done.  He is going to go for an EEG.  We will have to see what Neurology has to say about all of this.  We will certainly follow along as necessary.  Again, I am not sure as to what is triggering all of this.  I spoke to his wife this morning.  I told her that I just had never seen the chemotherapy for esophageal cancer cause his symptoms.  Again, I will have to see about the oxaliplatin.  We will follow along as indicated.     [name], M.D.     PRE/MEDQ  D:  09/04/2014  T:  09/04/2014  Job:  [id number]

## 2014-09-04 NOTE — Progress Notes (Signed)
INITIAL NUTRITION ASSESSMENT  DOCUMENTATION CODES Per approved criteria  -Severe malnutrition in the context of chronic illness  Pt meets criteria for severe MALNUTRITION in the context of chronic illness as evidenced by 13% body weight loss in < 3 months, PO intake < 75% for > one month.   INTERVENTION: -Recommend diet liberalization to encourage PO intake -Recommend Boost Plus BID - room temperature -Encouraged intake of room temperature high protein/kcal foods -Will continue to monitor  NUTRITION DIAGNOSIS: Inadequate oral intake related to decreased appetite as evidenced by PO intake < 75%, 13% body weight loss in < 3 months.   Goal: Pt to meet >/= 90% of their estimated nutrition needs    Monitor:  Diet order, total protein/energy intake, labs, weights, swallow profile  Reason for Assessment: MST  63 y.o. male  Admitting Dx: Transient global amnesia  ASSESSMENT: Willie Owens is a 63 y.o. male presents with an acute confusional state. He has a diagnosis of esophageal cancer and has recently been stopped on his chemotherapy. He was seen in the office recently and had started to have increased confusion   -Pt's wife reported pt with decreased appetite since beginning of chemo treatments.  Wife encourages pt to consume two Boost High Protein daily. Will eat small amounts of [place] foods such as pasta or cheese-filled pasta dishes, and will ocassionaly eat breakfast sandwiches.  -Wife noted that pt cannot tolerate cold foods d/t chemo treatments, and has experienced loss of taste that also inhibits appetite -Has been eating less in hospital, < 50% of meals, d/t heart healthy diet restrictions. Consider liberalizing diet to encourage appetite. -Endorsed weight loss; 14 lbs in past three weeks, and 27 lbs in < 3 months. Reviewed additional high kcal/protein snacks available at room temperature to include in diet  Height: Ht Readings from Last 1 Encounters:  09/03/14 6\' 6"   (1.981 m)    Weight: Wt Readings from Last 1 Encounters:  09/03/14 182 lb 12.8 oz (82.918 kg)    Ideal Body Weight: 214 lbs  % Ideal Body Weight: 85%  Wt Readings from Last 10 Encounters:  09/03/14 182 lb 12.8 oz (82.918 kg)  07/25/14 209 lb (94.802 kg)  07/18/14 209 lb (94.802 kg)  07/17/14 212 lb (96.163 kg)  07/15/14 212 lb 3.2 oz (96.253 kg)  07/08/14 207 lb 3.2 oz (93.985 kg)  06/19/14 214 lb 12.8 oz (97.433 kg)  01/22/14 215 lb 1.6 oz (97.569 kg)  10/05/13 216 lb 12.8 oz (98.34 kg)  09/14/13 218 lb 14.4 oz (99.292 kg)    Usual Body Weight: 210 lbs per previous med records  % Usual Body Weight: 87%  BMI:  Body mass index is 21.13 kg/(m^2).  Estimated Nutritional Needs: Kcal: 2300-2500 Protein: 105-120 gram Fluid: >/=2300 ml/daily  Skin: WDL  Diet Order: Cardiac  EDUCATION NEEDS: -Education needs addressed   Intake/Output Summary (Last 24 hours) at 09/04/14 1329 Last data filed at 09/04/14 1000  Gross per 24 hour  Intake 558.33 ml  Output      0 ml  Net 558.33 ml    Last BM: PTA   Labs:   Recent Labs Lab 09/03/14 1554 09/04/14 0417  NA 131* 130*  K 4.6 4.0  CL 95* 95*  CO2 23 26  BUN 19 22  CREATININE 0.76 0.79  CALCIUM 8.6 8.2*  GLUCOSE 121* 95    CBG (last 3)   Recent Labs  09/04/14 0837  GLUCAP 78    Scheduled Meds: . antiseptic oral  rinse  7 mL Mouth Rinse q12n4p  . aspirin  81 mg Oral Daily  . dexamethasone  8 mg Oral BID WC  . docusate sodium  100 mg Oral BID  . fluconazole  100 mg Oral Daily  . folic acid  1 mg Oral Daily  . heparin  5,000 Units Subcutaneous 3 times per day  . lactose free nutrition  237 mL Oral BID BM  . methimazole  2.5 mg Oral Daily  . metroNIDAZOLE  500 mg Oral TID  . multivitamin with minerals  1 tablet Oral Daily  . ondansetron  8 mg Oral BID  . pantoprazole  80 mg Oral Q1200  . propranolol ER  80 mg Oral QHS  . sodium chloride  3 mL Intravenous Q12H  . sucralfate  1 g Oral TID WC & HS  .  thiamine  100 mg Oral Daily  . venlafaxine  37.5 mg Oral BID WC    Continuous Infusions: . sodium chloride 50 mL/hr at 09/03/14 2250    Past Medical History  Diagnosis Date  . Atrial fibrillation     with RVR while hospitalized in 2014  . Hyperthyroidism     thyrotoxicosis , graves  . H/O [facility] spotted fever   . Gout   . History of antinuclear antibodies   . GERD (gastroesophageal reflux disease)   . Cancer 06/2014    esopageal ca-7/15  . Esophageal cancer 07/26/2014    Past Surgical History  Procedure Laterality Date  . Leg surgery      Fx, rod removed  . Transthoracic echocardiogram  4/292014    EF 55-60%; severe bi-atrial enlargement; RV mod dilated  . Cardioversion N/A 09/21/2013    Procedure: CARDIOVERSION;  Surgeon: Pixie Casino, MD;  [facility] ENDOSCOPY;  Service: Cardiovascular;  Laterality: N/A;  . Tonsillectomy and adenoidectomy      Atlee Abide MS RD LDN Clinical Dietitian [phone]'

## 2014-09-04 NOTE — Progress Notes (Signed)
[facility] PROGRESS NOTE  Willie Owens:[id number] DOB: 1951/05/18 DOA: 09-10-2014 PCP: Chesley Noon, MD  Assessment/Plan: 1. Transient global amnesia- ? Cause, will order EEG, B12 and folate levels, RPR urine drug screen as recommended by neurology. 2. COPD- stable. 3. Esophageal cancer- followed by oncology the hospital.  Code Status: Full code Family Communication: Discussed with wife at the bedside Disposition Plan: Home when stable   Consultants:  Neurology  Procedures:  None  Antibiotics:  None  HPI/Subjective: Willie Owens is an 63 y.o. male history of atrial fibrillation, hypothyroidism, gout and esophageal cancer, presenting with progressive confusion with memory loss with onset 09/01/2014. Symptoms were initially mild but became very noticeably worse on 10-Sep-2014 with inability to remember information from 1 minute to the next. MRI of his brain was unremarkable with no acute intracranial abnormality. Laboratory studies showed findings indicative of urinary tract infection, in addition to hyponatremia. He has had no focal deficits, and no other complaints  Patient still continues to have short-term memory loss. Neurology has seen the patient and recommend EEG, TSH, B12, folate, RPR.  Objective: Filed Vitals:   09/04/14 1413  BP: 118/66  Pulse: 75  Temp: 97.8 F (36.6 C)  Resp: 20    Intake/Output Summary (Last 24 hours) at 09/04/14 1551 Last data filed at 09/04/14 1413  Gross per 24 hour  Intake 1038.33 ml  Output      0 ml  Net 1038.33 ml   Filed Weights   09-10-14 1353 September 10, 2014 2154  Weight: 88.451 kg (195 lb) 82.918 kg (182 lb 12.8 oz)    Exam:  Physical Exam: Head: Normocephalic, atraumatic.  Neck: supple,No deformities, masses, or tenderness noted. Lungs: Normal respiratory effort. B/L Clear to auscultation, no crackles or wheezes.  Heart: Regular RR. S1 and S2 normal  Abdomen: BS normoactive. Soft, Nondistended,  non-tender.  Extremities: No pretibial edema, no erythema   Data Reviewed: Basic Metabolic Panel:  Recent Labs Lab 10-Sep-2014 1554 09/04/14 0417  NA 131* 130*  K 4.6 4.0  CL 95* 95*  CO2 23 26  GLUCOSE 121* 95  BUN 19 22  CREATININE 0.76 0.79  CALCIUM 8.6 8.2*   Liver Function Tests:  Recent Labs Lab 09-10-2014 1554 09/04/14 0417  AST 18 16  ALT 23 21  ALKPHOS 36* 35*  BILITOT 0.5 0.5  PROT 5.1* 4.7*  ALBUMIN 2.3* 2.2*   No results found for this basename: LIPASE, AMYLASE,  in the last 168 hours No results found for this basename: AMMONIA,  in the last 168 hours CBC:  Recent Labs Lab 2014-09-10 1554 09/04/14 0417  WBC 3.6* 5.4  NEUTROABS 1.9  --   HGB 12.3* 11.3*  HCT 36.7* 34.6*  MCV 80.1 82.0  PLT 107* 100*   Cardiac Enzymes: No results found for this basename: CKTOTAL, CKMB, CKMBINDEX, TROPONINI,  in the last 168 hours BNP (last 3 results) No results found for this basename: PROBNP,  in the last 8760 hours CBG:  Recent Labs Lab 09/04/14 0837  GLUCAP 78    No results found for this or any previous visit (from the past 240 hour(s)).   Studies: Mr Kizzie Fantasia Contrast  09/10/2014   CLINICAL DATA:  63 year old male with altered mental status, memory impairment. Initial encounter. Metastatic Esophageal cancer.  EXAM: MRI HEAD WITHOUT AND WITH CONTRAST  TECHNIQUE: Multiplanar, multiecho pulse sequences of the brain and surrounding structures were obtained without and with intravenous contrast.  CONTRAST:  26mL MULTIHANCE GADOBENATE DIMEGLUMINE 529  MG/ML IV SOLN  COMPARISON:  Brain MRI without and with contrast 07/27/2014.  FINDINGS: Major intracranial vascular flow voids are stable. No restricted diffusion to suggest acute infarction. No midline shift, mass effect, evidence of mass lesion, ventriculomegaly, extra-axial collection or acute intracranial hemorrhage. Cervicomedullary junction and pituitary are within normal limits.  No abnormal enhancement identified.  Developmental venous anomaly in the cerebellar hemispheres re- identified. Grossly normal visualized cervical spine, with normal visible bone marrow signal. Pearline Cables and white matter signal is stable and within normal limits for age. Visible internal auditory structures appear normal. Visualized paranasal sinuses and mastoids are clear. Visualized orbit soft tissues are within normal limits. Visualized scalp soft tissues are within normal limits.  IMPRESSION: Stable and negative for age, with no acute or metastatic intracranial abnormality.   Electronically Signed   By: Lars Pinks M.D.   On: 09/03/2014 20:01    Scheduled Meds: . antiseptic oral rinse  7 mL Mouth Rinse q12n4p  . aspirin  81 mg Oral Daily  . dexamethasone  8 mg Oral BID WC  . docusate sodium  100 mg Oral BID  . fluconazole  100 mg Oral Daily  . folic acid  1 mg Oral Daily  . heparin  5,000 Units Subcutaneous 3 times per day  . lactose free nutrition  237 mL Oral BID BM  . methimazole  2.5 mg Oral Daily  . metroNIDAZOLE  500 mg Oral TID  . multivitamin with minerals  1 tablet Oral Daily  . ondansetron  8 mg Oral BID  . pantoprazole  80 mg Oral Q1200  . propranolol ER  80 mg Oral QHS  . sodium chloride  3 mL Intravenous Q12H  . sucralfate  1 g Oral TID WC & HS  . thiamine  100 mg Oral Daily  . venlafaxine  37.5 mg Oral BID WC   Continuous Infusions: . sodium chloride 50 mL/hr at 09/03/14 2250    Principal Problem:   Transient global amnesia Active Problems:   COPD (chronic obstructive pulmonary disease)   Esophageal cancer   Acute confusional state   Protein-calorie malnutrition, severe    Time spent: 25 min    [facility] Pager [phone]. If 7PM-7AM, please contact night-coverage at [email or web address], password [facility] 09/04/2014, 3:51 PM  LOS: 1 day

## 2014-09-04 NOTE — Consult Note (Addendum)
Reason for Consult: New-onset memory difficulty.  HPI:                                                                                                                                          Willie Owens is an 63 y.o. male history of atrial fibrillation, hypothyroidism, gout and esophageal cancer, presenting with progressive confusion with memory loss with onset 09/01/2014. Symptoms were initially mild but became very noticeably worse on 09/03/2014 with inability to remember information from 1 minute to the next. MRI of his brain was unremarkable with no acute intracranial abnormality. Laboratory studies showed findings indicative of urinary tract infection, in addition to hyponatremia. He has had no focal deficits, and no other complaints.  Past Medical History  Diagnosis Date  . Atrial fibrillation     with RVR while hospitalized in 2014  . Hyperthyroidism     thyrotoxicosis , graves  . H/O [facility] spotted fever   . Gout   . History of antinuclear antibodies   . GERD (gastroesophageal reflux disease)   . Cancer 06/2014    esopageal ca-7/15  . Esophageal cancer 07/26/2014    Past Surgical History  Procedure Laterality Date  . Leg surgery      Fx, rod removed  . Transthoracic echocardiogram  4/292014    EF 55-60%; severe bi-atrial enlargement; RV mod dilated  . Cardioversion N/A 09/21/2013    Procedure: CARDIOVERSION;  Surgeon: Pixie Casino, MD;  Location: [facility] ENDOSCOPY;  Service: Cardiovascular;  Laterality: N/A;  . Tonsillectomy and adenoidectomy      Family History  Problem Relation Age of Onset  . Pulmonary embolism Neg Hx   . CAD Neg Hx   . Colon cancer Neg Hx   . Stomach cancer Neg Hx   . Esophageal cancer Neg Hx   . Diabetes Mother     Social History:  reports that he quit smoking about 9 years ago. His smoking use included Cigarettes. He started smoking about 43 years ago. He has a 68 pack-year smoking history. He has never used smokeless tobacco. He  reports that he drinks about 1.5 ounces of alcohol per week. He reports that he does not use illicit drugs.  Allergies  Allergen Reactions  . Penicillins Other (See Comments)    Hallucinations, from childhood    MEDICATIONS:  I have reviewed the patient's current medications.   ROS:                                                                                                                                       History obtained from spouse  General ROS: negative for - chills, fatigue, fever, night sweats, weight gain or weight loss Psychological ROS: negative for - behavioral disorder, hallucinations, memory difficulties, mood swings or suicidal ideation Ophthalmic ROS: negative for - blurry vision, double vision, eye pain or loss of vision ENT ROS: negative for - epistaxis, nasal discharge, oral lesions, sore throat, tinnitus or vertigo Allergy and Immunology ROS: negative for - hives or itchy/watery eyes Hematological and Lymphatic ROS: negative for - bleeding problems, bruising or swollen lymph nodes Endocrine ROS: negative for - galactorrhea, hair pattern changes, polydipsia/polyuria or temperature intolerance Respiratory ROS: negative for - cough, hemoptysis, shortness of breath or wheezing Cardiovascular ROS: negative for - chest pain, dyspnea on exertion, edema or irregular heartbeat Gastrointestinal ROS: negative for - abdominal pain, diarrhea, hematemesis, nausea/vomiting or stool incontinence Genito-Urinary ROS: negative for - dysuria, hematuria, incontinence or urinary frequency/urgency Musculoskeletal ROS: negative for - joint swelling or muscular weakness Neurological ROS: as noted in HPI Dermatological ROS: negative for rash and skin lesion changes   Blood pressure 119/71, pulse 78, temperature 97.6 F (36.4 C), temperature source Oral, resp. rate 18,  height 6' 6"  (1.981 m), weight 82.918 kg (182 lb 12.8 oz), SpO2 97.00%.   Neurologic Examination:                                                                                                      Mental Status: Alert, disoriented to time as well as place and unable to remember answers given for orientation. Patient repeatedly asked why he was at the hospital even though the question was repeatedly answered. He also has significant long-term memory difficulty including not remembering who the current president is, as well as unable to remember his own occupation. Speech fluent without evidence of aphasia. Able to follow commands without difficulty. Cranial Nerves: II-Visual fields were normal. III/IV/VI-Pupils were equal and reacted. Extraocular movements were full and conjugate.    V/VII-no facial numbness and no facial weakness. VIII-normal. X-normal speech and symmetrical palatal movement. Motor: 5/5 bilaterally with normal tone and bulk Sensory: Normal throughout. Deep Tendon Reflexes: 2+ and symmetric. Plantars: Flexor bilaterally Cerebellar: Normal finger-to-nose testing.  No results found for this basename: cbc, bmp, coags, chol, tri,  ldl, hga1c    Results for orders placed during the hospital encounter of 09/03/14 (from the past 48 hour(s))  CBC WITH DIFFERENTIAL     Status: Abnormal   Collection Time    09/03/14  3:54 PM      Result Value Ref Range   WBC 3.6 (*) 4.0 - 10.5 K/uL   RBC 4.58  4.22 - 5.81 MIL/uL   Hemoglobin 12.3 (*) 13.0 - 17.0 g/dL   HCT 36.7 (*) 39.0 - 52.0 %   MCV 80.1  78.0 - 100.0 fL   MCH 26.9  26.0 - 34.0 pg   MCHC 33.5  30.0 - 36.0 g/dL   RDW 21.5 (*) 11.5 - 15.5 %   Platelets 107 (*) 150 - 400 K/uL   Comment: REPEATED TO VERIFY     SPECIMEN CHECKED FOR CLOTS     PLATELET COUNT CONFIRMED BY SMEAR   Neutrophils Relative % 53  43 - 77 %   Lymphocytes Relative 30  12 - 46 %   Monocytes Relative 17 (*) 3 - 12 %   Eosinophils Relative 0  0 - 5 %    Basophils Relative 0  0 - 1 %   Neutro Abs 1.9  1.7 - 7.7 K/uL   Lymphs Abs 1.1  0.7 - 4.0 K/uL   Monocytes Absolute 0.6  0.1 - 1.0 K/uL   Eosinophils Absolute 0.0  0.0 - 0.7 K/uL   Basophils Absolute 0.0  0.0 - 0.1 K/uL   RBC Morphology RARE NRBCs     Comment: ELLIPTOCYTES   WBC Morphology INCREASED BANDS (>20% BANDS)     Comment: MILD LEFT SHIFT (1-5% METAS, OCC MYELO, OCC BANDS)  COMPREHENSIVE METABOLIC PANEL     Status: Abnormal   Collection Time    09/03/14  3:54 PM      Result Value Ref Range   Sodium 131 (*) 137 - 147 mEq/L   Potassium 4.6  3.7 - 5.3 mEq/L   Chloride 95 (*) 96 - 112 mEq/L   CO2 23  19 - 32 mEq/L   Glucose, Bld 121 (*) 70 - 99 mg/dL   BUN 19  6 - 23 mg/dL   Creatinine, Ser 0.76  0.50 - 1.35 mg/dL   Calcium 8.6  8.4 - 10.5 mg/dL   Total Protein 5.1 (*) 6.0 - 8.3 g/dL   Albumin 2.3 (*) 3.5 - 5.2 g/dL   AST 18  0 - 37 U/L   ALT 23  0 - 53 U/L   Alkaline Phosphatase 36 (*) 39 - 117 U/L   Total Bilirubin 0.5  0.3 - 1.2 mg/dL   GFR calc non Af Amer >90  >90 mL/min   GFR calc Af Amer >90  >90 mL/min   Comment: (NOTE)     The eGFR has been calculated using the CKD EPI equation.     This calculation has not been validated in all clinical situations.     eGFR's persistently <90 mL/min signify possible Chronic Kidney     Disease.   Anion gap 13  5 - 15  URINALYSIS, ROUTINE W REFLEX MICROSCOPIC     Status: Abnormal   Collection Time    09/03/14  4:08 PM      Result Value Ref Range   Color, Urine RED (*) YELLOW   Comment: BIOCHEMICALS MAY BE AFFECTED BY COLOR   APPearance CLOUDY (*) CLEAR   Specific Gravity, Urine 1.029  1.005 - 1.030   pH 6.0  5.0 -  8.0   Glucose, UA NEGATIVE  NEGATIVE mg/dL   Hgb urine dipstick NEGATIVE  NEGATIVE   Bilirubin Urine SMALL (*) NEGATIVE   Ketones, ur NEGATIVE  NEGATIVE mg/dL   Protein, ur NEGATIVE  NEGATIVE mg/dL   Urobilinogen, UA 1.0  0.0 - 1.0 mg/dL   Nitrite POSITIVE (*) NEGATIVE   Leukocytes, UA SMALL (*) NEGATIVE   URINE MICROSCOPIC-ADD ON     Status: Abnormal   Collection Time    09/03/14  4:08 PM      Result Value Ref Range   Squamous Epithelial / LPF RARE  RARE   WBC, UA 3-6  <3 WBC/hpf   Bacteria, UA FEW (*) RARE   Crystals CA OXALATE CRYSTALS (*) NEGATIVE   Urine-Other MUCOUS PRESENT      Mr Kizzie Fantasia Contrast  09/03/2014   CLINICAL DATA:  63 year old male with altered mental status, memory impairment. Initial encounter. Metastatic Esophageal cancer.  EXAM: MRI HEAD WITHOUT AND WITH CONTRAST  TECHNIQUE: Multiplanar, multiecho pulse sequences of the brain and surrounding structures were obtained without and with intravenous contrast.  CONTRAST:  26m MULTIHANCE GADOBENATE DIMEGLUMINE 529 MG/ML IV SOLN  COMPARISON:  Brain MRI without and with contrast 07/27/2014.  FINDINGS: Major intracranial vascular flow voids are stable. No restricted diffusion to suggest acute infarction. No midline shift, mass effect, evidence of mass lesion, ventriculomegaly, extra-axial collection or acute intracranial hemorrhage. Cervicomedullary junction and pituitary are within normal limits.  No abnormal enhancement identified. Developmental venous anomaly in the cerebellar hemispheres re- identified. Grossly normal visualized cervical spine, with normal visible bone marrow signal. GPearline Cablesand white matter signal is stable and within normal limits for age. Visible internal auditory structures appear normal. Visualized paranasal sinuses and mastoids are clear. Visualized orbit soft tissues are within normal limits. Visualized scalp soft tissues are within normal limits.  IMPRESSION: Stable and negative for age, with no acute or metastatic intracranial abnormality.   Electronically Signed   By: LLars PinksM.D.   On: 09/03/2014 20:01   Assessment/Plan: 63year old man with new onset progressive memory difficulty with profound short-term and long-term memory difficulty at this point. Findings are consistent with transient global amnesia  with respect to recent memory difficulty. However, his deficits have now been continuing for more than 48 hours, and getting worse, in addition to patient having profound long-term memory difficulty which is not typical of transient global aphasia. MRI study of the brain did not show any signs of acute stroke nor any signs of mass lesion. Etiology for new-onset memory difficulty remains clear. Acute onset of Korsakoff's syndrome cannot be ruled out.  Recommendations: 1. EEG, routine study 2. Vitamin B12 and folate levels. 3. TSH 4. RPR 5. Urine drug screen  We will continue to follow this patient with you.  C.R. SNicole Kindred MD [facility] [phone] 09/04/2014, 12:38 AM

## 2014-09-05 ENCOUNTER — Inpatient Hospital Stay (HOSPITAL_COMMUNITY)
Admit: 2014-09-05 | Discharge: 2014-09-05 | Disposition: A | Discharge status: Home / Self Care | Payer: Managed Care, Other (non HMO) | Attending: Family Medicine | Admitting: Family Medicine

## 2014-09-05 DIAGNOSIS — N390 Urinary tract infection, site not specified: Secondary | ICD-10-CM

## 2014-09-05 LAB — HIV ANTIBODY (ROUTINE TESTING W REFLEX): HIV 1&2 Ab, 4th Generation: NONREACTIVE

## 2014-09-05 LAB — GLUCOSE, CAPILLARY: Glucose-Capillary: 116 mg/dL — ABNORMAL HIGH (ref 70–99)

## 2014-09-05 LAB — FOLATE RBC: RBC Folate: 1164 ng/mL — ABNORMAL HIGH (ref >280)

## 2014-09-05 LAB — RPR

## 2014-09-05 MED ORDER — LEVOFLOXACIN 500 MG PO TABS: 500.0000 mg | ORAL_TABLET | Freq: Every day | ORAL | Status: DC

## 2014-09-05 MED ORDER — HEPARIN SOD (PORK) LOCK FLUSH 100 UNIT/ML IV SOLN
500.0000 [IU] | INTRAVENOUS | Status: AC | PRN
  Administered 2014-09-05: 500 [IU]

## 2014-09-05 NOTE — Discharge Summary (Signed)
Physician Discharge Summary  Willie Owens OHY:[id number] DOB: 09-01-1951 DOA: 09/03/2014  PCP: Chesley Noon, MD  Admit date: 09/03/2014 Discharge date: 09/05/2014  Time spent: *50 minutes  Recommendations for Outpatient Follow-up:  1. *Follow up PCP in 2 weeks  Discharge Diagnoses:  Principal Problem:   Transient global amnesia Active Problems:   COPD (chronic obstructive pulmonary disease)   Esophageal cancer   Acute confusional state   Protein-calorie malnutrition, severe   Discharge Condition: Stable  Diet recommendation: Low salt diet  Filed Weights   09/03/14 1353 09/03/14 2154  Weight: 88.451 kg (195 lb) 82.918 kg (182 lb 12.8 oz)    History of present illness:  63 y.o. male presents with an acute confusional state. He has a diagnosis of esophageal cancer and has recently been stopped on his chemotherapy. He was seen in the office recently and had started to have increased confusion. Patients oncologist stopped his Xeloda. He had been having some diarrhea noted at that time. Patient apparently has memory loss for short term events. He states he did not know he has cancer. Patients findings are inconsistent however as to how far of a memory loss he has and it seems to be back before the cancer diagnosis he can remember but after the diagnosis he does not remember. However even this is not consistent as I asked him about his grand kids and he states he did not know he had grand kids and than he remembered who the kids belonged to. He has no headaches no seizures. He has no CP noted. He has no syncope noted. Patient has no prior stroke history. He does have a ETOH use history with a history of withdrawal in the past.      Hospital Course:   Transient global amnesia- Resolved,  Patient is back to his baseline,  ? Cause, EEG showed bifrontal slowing, no epileptiform discharges.RPR is non reactive, HIV nonreactive, B12 level 909 ? UTI- Patient had abnormal UA at the time of  admission, urine culture is pending.Will send home on Po Levaquin 500 mg po daily x 5 days. Follow the urine culture as outpatient.  COPD- stable.   Esophageal cancer- followed by oncology    Procedures: EEG this is an abnormal awake EEG because of the above described findings.  Overall, the study is suggestive of non specific bi hemispheric cerebral dysfunction that seems to be more focalized to the left frontal region. No electrographic seizures noted. Please, be aware that the absence of epileptiform discharges does not exclude the possibility of epilepsy.     Consultations:  Neurology  Discharge Exam: Filed Vitals:   09/05/14 0523  BP: 138/73  Pulse: 63  Temp: 97.5 F (36.4 C)  Resp: 18    General: Appear in no acute distress Cardiovascular: s1s2 RRR Respiratory: clear bilaterally  Discharge Instructions You were cared for by a hospitalist during your hospital stay. If you have any questions about your discharge medications or the care you received while you were in the hospital after you are discharged, you can call the unit and asked to speak with the hospitalist on call if the hospitalist that took care of you is not available. Once you are discharged, your primary care physician will handle any further medical issues. Please note that NO REFILLS for any discharge medications will be authorized once you are discharged, as it is imperative that you return to your primary care physician (or establish a relationship with a primary care physician if you do  not have one) for your aftercare needs so that they can reassess your need for medications and monitor your lab values.  Discharge Instructions   Diet - low sodium heart healthy    Complete by:  As directed      Increase activity slowly    Complete by:  As directed           Current Discharge Medication List    CONTINUE these medications which have NOT CHANGED   Details  aspirin 81 MG tablet Take 81 mg by mouth  daily.   Associated Diagnoses: Malignant neoplasm of lower third of esophagus    B Complex-C (SUPER B COMPLEX PO) Take 1 tablet by mouth daily.    Cholecalciferol (VITAMIN D) 2000 UNITS tablet Take 2,000 Units by mouth daily.    dexamethasone (DECADRON) 4 MG tablet Take 2 tablets (8 mg total) by mouth 2 (two) times daily with a meal. Start the day after chemotherapy for 3 days. Qty: 90 tablet, Refills: 2   Associated Diagnoses: Esophageal cancer; Chemotherapy-induced peripheral neuropathy    diphenoxylate-atropine (LOMOTIL) 2.5-0.025 MG per tablet Take 1 tablet by mouth 4 (four) times daily as needed for diarrhea or loose stools.    esomeprazole (NEXIUM) 40 MG capsule Take 40 mg by mouth 2 (two) times daily before a meal.    fluconazole (DIFLUCAN) 100 MG tablet Take 1 tablet (100 mg total) by mouth daily. Qty: 30 tablet, Refills: 4   Associated Diagnoses: Esophageal cancer; Chemotherapy-induced peripheral neuropathy    lidocaine-prilocaine (EMLA) cream Apply 1 application topically as needed. Qty: 30 g, Refills: 10    LORazepam (ATIVAN) 0.5 MG tablet Take 1 tablet (0.5 mg total) by mouth every 6 (six) hours as needed (nausea and vomitting). Qty: 30 tablet, Refills: 0    methimazole (TAPAZOLE) 5 MG tablet Take 2.5 mg by mouth daily. TAKES 1/2 TAB = 2.5    ondansetron (ZOFRAN) 8 MG tablet Take 1 tablet (8 mg total) by mouth 2 (two) times daily. Start the day after chemo for 3 days. Then take as needed for nausea or vomiting. Qty: 30 tablet, Refills: 1   Associated Diagnoses: Esophageal cancer    potassium chloride SA (K-DUR,KLOR-CON) 20 MEQ tablet Take 2 tablets twice a day for 5 days, then take 1 tablet twice a day Qty: 60 tablet, Refills: 4   Associated Diagnoses: Esophageal cancer; Chemotherapy-induced peripheral neuropathy    PRESCRIPTION MEDICATION Chemotherapy at [facility]    propranolol ER (INDERAL LA) 80 MG 24 hr capsule Take 80 mg by mouth at bedtime.    sucralfate (CARAFATE)  1 GM/10ML suspension Take 1 g by mouth 4 (four) times daily -  with meals and at bedtime.    venlafaxine (EFFEXOR) 37.5 MG tablet Take 1 tablet (37.5 mg total) by mouth 2 (two) times daily with a meal. Qty: 60 tablet, Refills: 6   Associated Diagnoses: Esophageal cancer; Chemotherapy-induced peripheral neuropathy    lansoprazole (PREVACID SOLUTAB) 30 MG disintegrating tablet Take 30 mg by mouth 2 (two) times daily.    prochlorperazine (COMPAZINE) 10 MG tablet Take 1 tablet (10 mg total) by mouth every 6 (six) hours as needed (Nausea or vomiting). Qty: 30 tablet, Refills: 1   Associated Diagnoses: Esophageal cancer      STOP taking these medications     metroNIDAZOLE (FLAGYL) 500 MG tablet        Allergies  Allergen Reactions  . Penicillins Other (See Comments)    Hallucinations, from childhood  The results of significant diagnostics from this hospitalization (including imaging, microbiology, ancillary and laboratory) are listed below for reference.    Significant Diagnostic Studies: Mr Kizzie Fantasia Contrast  2014/09/24   CLINICAL DATA:  63 year old male with altered mental status, memory impairment. Initial encounter. Metastatic Esophageal cancer.  EXAM: MRI HEAD WITHOUT AND WITH CONTRAST  TECHNIQUE: Multiplanar, multiecho pulse sequences of the brain and surrounding structures were obtained without and with intravenous contrast.  CONTRAST:  66mL MULTIHANCE GADOBENATE DIMEGLUMINE 529 MG/ML IV SOLN  COMPARISON:  Brain MRI without and with contrast 07/27/2014.  FINDINGS: Major intracranial vascular flow voids are stable. No restricted diffusion to suggest acute infarction. No midline shift, mass effect, evidence of mass lesion, ventriculomegaly, extra-axial collection or acute intracranial hemorrhage. Cervicomedullary junction and pituitary are within normal limits.  No abnormal enhancement identified. Developmental venous anomaly in the cerebellar hemispheres re- identified. Grossly  normal visualized cervical spine, with normal visible bone marrow signal. Pearline Cables and white matter signal is stable and within normal limits for age. Visible internal auditory structures appear normal. Visualized paranasal sinuses and mastoids are clear. Visualized orbit soft tissues are within normal limits. Visualized scalp soft tissues are within normal limits.  IMPRESSION: Stable and negative for age, with no acute or metastatic intracranial abnormality.   Electronically Signed   By: Lars Pinks M.D.   On: 09/24/14 20:01    Microbiology: Recent Results (from the past 240 hour(s))  URINE CULTURE     Status: None   Collection Time    09/24/2014  5:56 PM      Result Value Ref Range Status   Specimen Description URINE, RANDOM   Final   Special Requests NONE   Final   Culture  Setup Time     Final   Value: 09/04/2014 00:07     Performed at [place] PENDING   Incomplete   Culture     Final   Value: Culture reincubated for better growth     Performed at [facility]   Report Status PENDING   Incomplete     Labs: Basic Metabolic Panel:  Recent Labs Lab Sep 24, 2014 1554 09/04/14 0417  NA 131* 130*  K 4.6 4.0  CL 95* 95*  CO2 23 26  GLUCOSE 121* 95  BUN 19 22  CREATININE 0.76 0.79  CALCIUM 8.6 8.2*   Liver Function Tests:  Recent Labs Lab 09/24/14 1554 09/04/14 0417  AST 18 16  ALT 23 21  ALKPHOS 36* 35*  BILITOT 0.5 0.5  PROT 5.1* 4.7*  ALBUMIN 2.3* 2.2*   No results found for this basename: LIPASE, AMYLASE,  in the last 168 hours No results found for this basename: AMMONIA,  in the last 168 hours CBC:  Recent Labs Lab 09-24-14 1554 09/04/14 0417  WBC 3.6* 5.4  NEUTROABS 1.9  --   HGB 12.3* 11.3*  HCT 36.7* 34.6*  MCV 80.1 82.0  PLT 107* 100*   Cardiac Enzymes: No results found for this basename: CKTOTAL, CKMB, CKMBINDEX, TROPONINI,  in the last 168 hours BNP: BNP (last 3 results) No results found for this basename: PROBNP,   in the last 8760 hours CBG:  Recent Labs Lab 09/04/14 0837 09/05/14 0745  GLUCAP 78 116*       Signed:  LAMA,GAGAN S  [facility] 09/05/2014, 12:49 PM

## 2014-09-05 NOTE — Progress Notes (Signed)
Subjective: Patient currently obtaining EEG.  HE is back to baseline per wife.   Objective: Current vital signs: BP 138/73  Pulse 63  Temp(Src) 97.5 F (36.4 C) (Oral)  Resp 18  Ht 6\' 6"  (1.981 m)  Wt 82.918 kg (182 lb 12.8 oz)  BMI 21.13 kg/m2  SpO2 99% Vital signs in last 24 hours: Temp:  [97.5 F (36.4 C)-97.9 F (36.6 C)] 97.5 F (36.4 C) (09/10 0523) Pulse Rate:  [63-82] 63 (09/10 0523) Resp:  [18-20] 18 (09/10 0523) BP: (118-138)/(66-73) 138/73 mmHg (09/10 0523) SpO2:  [97 %-99 %] 99 % (09/10 0523)  Intake/Output from previous day: 09/09 0701 - 09/10 0700 In: 1970 [P.O.:720; I.V.:1250] Out: -  Intake/Output this shift:   Nutritional status: Cardiac  Neurologic Exam: Mental Status:  Alert, oriented, able to state year and mont, able to state his previous job and recent events.  Speech fluent without evidence of aphasia. Able to follow commands without difficulty.  Cranial Nerves:  II-Visual fields were normal.  III/IV/VI-Pupils were equal and reacted. Extraocular movements were full and conjugate.  V/VII-no facial numbness and no facial weakness.  VIII-normal.  X-normal speech and symmetrical palatal movement.  Motor: 5/5 bilaterally with normal tone and bulk  Sensory: Normal throughout.  Deep Tendon Reflexes: 2+ and symmetric.  Plantars: Flexor bilaterally  Cerebellar: Normal finger-to-nose testing.   Lab Results: Basic Metabolic Panel:  Recent Labs Lab 09/03/14 1554 09/04/14 0417  NA 131* 130*  K 4.6 4.0  CL 95* 95*  CO2 23 26  GLUCOSE 121* 95  BUN 19 22  CREATININE 0.76 0.79  CALCIUM 8.6 8.2*    Liver Function Tests:  Recent Labs Lab 09/03/14 1554 09/04/14 0417  AST 18 16  ALT 23 21  ALKPHOS 36* 35*  BILITOT 0.5 0.5  PROT 5.1* 4.7*  ALBUMIN 2.3* 2.2*   No results found for this basename: LIPASE, AMYLASE,  in the last 168 hours No results found for this basename: AMMONIA,  in the last 168 hours  CBC:  Recent Labs Lab  09/03/14 1554 09/04/14 0417  WBC 3.6* 5.4  NEUTROABS 1.9  --   HGB 12.3* 11.3*  HCT 36.7* 34.6*  MCV 80.1 82.0  PLT 107* 100*    Cardiac Enzymes: No results found for this basename: CKTOTAL, CKMB, CKMBINDEX, TROPONINI,  in the last 168 hours  Lipid Panel: No results found for this basename: CHOL, TRIG, HDL, CHOLHDL, VLDL, LDLCALC,  in the last 168 hours  CBG:  Recent Labs Lab 09/04/14 0837 09/05/14 0745  GLUCAP 42 116*    Microbiology: Results for orders placed during the hospital encounter of 09/03/14  URINE CULTURE     Status: None   Collection Time    09/03/14  5:56 PM      Result Value Ref Range Status   Specimen Description URINE, RANDOM   Final   Special Requests NONE   Final   Culture  Setup Time     Final   Value: 09/04/2014 00:07     Performed at [place] PENDING   Incomplete   Culture     Final   Value: Culture reincubated for better growth     Performed at [facility]   Report Status PENDING   Incomplete    Coagulation Studies: No results found for this basename: LABPROT, INR,  in the last 72 hours  Imaging: Mr Jeri Cos Wo Contrast  09/03/2014   CLINICAL DATA:  63 year old male  with altered mental status, memory impairment. Initial encounter. Metastatic Esophageal cancer.  EXAM: MRI HEAD WITHOUT AND WITH CONTRAST  TECHNIQUE: Multiplanar, multiecho pulse sequences of the brain and surrounding structures were obtained without and with intravenous contrast.  CONTRAST:  46mL MULTIHANCE GADOBENATE DIMEGLUMINE 529 MG/ML IV SOLN  COMPARISON:  Brain MRI without and with contrast 07/27/2014.  FINDINGS: Major intracranial vascular flow voids are stable. No restricted diffusion to suggest acute infarction. No midline shift, mass effect, evidence of mass lesion, ventriculomegaly, extra-axial collection or acute intracranial hemorrhage. Cervicomedullary junction and pituitary are within normal limits.  No abnormal enhancement  identified. Developmental venous anomaly in the cerebellar hemispheres re- identified. Grossly normal visualized cervical spine, with normal visible bone marrow signal. Pearline Cables and white matter signal is stable and within normal limits for age. Visible internal auditory structures appear normal. Visualized paranasal sinuses and mastoids are clear. Visualized orbit soft tissues are within normal limits. Visualized scalp soft tissues are within normal limits.  IMPRESSION: Stable and negative for age, with no acute or metastatic intracranial abnormality.   Electronically Signed   By: Lars Pinks M.D.   On: 09/03/2014 20:01    Medications:  Scheduled: . antiseptic oral rinse  7 mL Mouth Rinse q12n4p  . aspirin  81 mg Oral Daily  . docusate sodium  100 mg Oral BID  . fluconazole  100 mg Oral Daily  . folic acid  1 mg Oral Daily  . heparin  5,000 Units Subcutaneous 3 times per day  . lactose free nutrition  237 mL Oral BID BM  . methimazole  2.5 mg Oral Daily  . metroNIDAZOLE  500 mg Oral TID  . multivitamin with minerals  1 tablet Oral Daily  . ondansetron  8 mg Oral BID  . pantoprazole  80 mg Oral Q1200  . propranolol ER  80 mg Oral QHS  . sodium chloride  3 mL Intravenous Q12H  . sucralfate  1 g Oral TID WC & HS  . thiamine  100 mg Oral Daily  . venlafaxine  37.5 mg Oral BID WC    Assessment/Plan: 63 year old man with new onset progressive memory difficulty with profound short-term and long-term memory difficulty at this point. Findings are consistent with transient global amnesia with respect to recent memory difficulty. Due to his deficits lasting longer than 48 hours and including long term memory does not fully fit TGA. MRI brain was normal. Patients sodium remains low at 130 and currently being treated for UTI. Currently he has returned to his baseline per wife and able to answer all my questions appropriately. At this time etiology not fully clear.    Recommendations:  1. EEG, routine study  --Currently being done--if negative, no further recommendations     Etta Quill PA-C [facility] [phone]  09/05/2014, 9:07 AM  Addendum: abnormal awake EEG  suggestive of non specific bi hemispheric cerebral dysfunction that seems to be more focalized to the left frontal region. No electrographic seizures or epileptiform discharges noted. Agree with discharging patient home. Will need outpatient neurology follow up in 2 weeks.  Dorian Pod, MD

## 2014-09-05 NOTE — Care Management Note (Signed)
    Page 1 of 1   09/05/2014     1:38:48 PM CARE MANAGEMENT NOTE 09/05/2014  Patient:  REINALDO, HELT   Account Number:  [id number]  Date Initiated:  09/05/2014  Documentation initiated by:  Dessa Phi  Subjective/Objective Assessment:   63 Y/O M ADMITTED W/TRANSIENT GLOBAL AMNESIA.     Action/Plan:   FROM HOME.   Anticipated DC Date:  09/05/2014   Anticipated DC Plan:  [place]  CM consult      Choice offered to / List presented to:             Status of service:  Completed, signed off Medicare Important Message given?   (If response is "NO", the following Medicare IM given date fields will be blank) Date Medicare IM given:   Medicare IM given by:   Date Additional Medicare IM given:   Additional Medicare IM given by:    Discharge Disposition:  HOME/SELF CARE  Per UR Regulation:  Reviewed for med. necessity/level of care/duration of stay  If discussed at [place] of Stay Meetings, dates discussed:    Comments:  09/05/14 Annaya Bangert RN,BSN [phone] D/C HOME NO [place].

## 2014-09-05 NOTE — Procedures (Addendum)
EEG report.  Brief clinical history: 63 year old man with new onset progressive memory difficulty with profound short-term and long-term memory difficulty at this point. No prior history of frank epileptic seizures.   Technique: this is a 17 channel routine scalp EEG performed at the bedside with bipolar and monopolar montages arranged in accordance to the international 10/20 system of electrode placement. One channel was dedicated to EKG recording.  No drowsiness or sleep achieved during the test. No activating procedures performed.  Description:In the wakeful state, the best background consisted of a medium amplitude, posterior dominant, poorly sustained, symmetric and reactive 8 Hz rhythm. There is nearly continuous high amplitude 4 Hz quasi rhythmic pattern over the left greater than right fronto-temporal-central region that does not exhibit an evolving pattern. The degree of theta slowing is also more prominent over the left frontal region  No focal or generalized epileptiform discharges noted.   EKG showed sinus rhythm.  Impression: this is an abnormal awake EEG because of the above described findings. Overall, the study is suggestive of non specific bi hemispheric cerebral dysfunction that seems to be more focalized to the left frontal region. No electrographic seizures noted. Please, be aware that the absence of epileptiform discharges does not exclude the possibility of epilepsy.   Clinical correlation is advised.  Dorian Pod, MD

## 2014-09-05 NOTE — Progress Notes (Signed)
EEG completed, results pending. 

## 2014-09-05 NOTE — Progress Notes (Signed)
Patient discharged home with wife, discharge instructions given and explained to patient/wife and they verbalized understanding, denies any pain/distress. No wound noted, skin intact. Accompanied home by wife, transported to the car by staff via wheelchair.

## 2014-09-07 LAB — URINE CULTURE: Colony Count: 50000

## 2014-09-09 ENCOUNTER — Ambulatory Visit (HOSPITAL_COMMUNITY)
Admission: RE | Admit: 2014-09-09 | Discharge: 2014-09-09 | Disposition: A | Discharge status: Home / Self Care | Payer: Managed Care, Other (non HMO) | Source: Ambulatory Visit | Attending: Hematology & Oncology | Admitting: Hematology & Oncology

## 2014-09-09 DIAGNOSIS — G622 Polyneuropathy due to other toxic agents: Secondary | ICD-10-CM | POA: Diagnosis not present

## 2014-09-09 DIAGNOSIS — T451X5A Adverse effect of antineoplastic and immunosuppressive drugs, initial encounter: Secondary | ICD-10-CM | POA: Insufficient documentation

## 2014-09-09 DIAGNOSIS — C159 Malignant neoplasm of esophagus, unspecified: Secondary | ICD-10-CM | POA: Insufficient documentation

## 2014-09-09 DIAGNOSIS — G620 Drug-induced polyneuropathy: Secondary | ICD-10-CM

## 2014-09-09 LAB — GLUCOSE, CAPILLARY: GLUCOSE-CAPILLARY: 120 mg/dL — AB (ref 70–99)

## 2014-09-09 MED ORDER — FLUDEOXYGLUCOSE F - 18 (FDG) INJECTION
9.0800 | Freq: Once | INTRAVENOUS | Status: AC | PRN
  Administered 2014-09-09: 9.08 via INTRAVENOUS

## 2014-09-10 ENCOUNTER — Encounter: Payer: Self-pay | Admitting: Family

## 2014-09-10 ENCOUNTER — Ambulatory Visit (HOSPITAL_BASED_OUTPATIENT_CLINIC_OR_DEPARTMENT_OTHER): Payer: Managed Care, Other (non HMO)

## 2014-09-10 ENCOUNTER — Other Ambulatory Visit (HOSPITAL_BASED_OUTPATIENT_CLINIC_OR_DEPARTMENT_OTHER): Payer: Managed Care, Other (non HMO) | Admitting: Lab

## 2014-09-10 ENCOUNTER — Other Ambulatory Visit: Payer: Self-pay | Admitting: Family

## 2014-09-10 ENCOUNTER — Ambulatory Visit (HOSPITAL_BASED_OUTPATIENT_CLINIC_OR_DEPARTMENT_OTHER): Payer: Managed Care, Other (non HMO) | Admitting: Family

## 2014-09-10 VITALS — BP 113/71 | HR 100 | Temp 97.3°F | Resp 20 | Ht 74.0 in | Wt 178.0 lb

## 2014-09-10 DIAGNOSIS — C159 Malignant neoplasm of esophagus, unspecified: Secondary | ICD-10-CM

## 2014-09-10 DIAGNOSIS — C155 Malignant neoplasm of lower third of esophagus: Secondary | ICD-10-CM

## 2014-09-10 DIAGNOSIS — T451X5A Adverse effect of antineoplastic and immunosuppressive drugs, initial encounter: Secondary | ICD-10-CM

## 2014-09-10 DIAGNOSIS — G620 Drug-induced polyneuropathy: Secondary | ICD-10-CM

## 2014-09-10 LAB — CMP (CANCER CENTER ONLY)
ALK PHOS: 37 U/L (ref 26–84)
ALT(SGPT): 38 U/L (ref 10–47)
AST: 25 U/L (ref 11–38)
Albumin: 2.9 g/dL — ABNORMAL LOW (ref 3.3–5.5)
BUN, Bld: 21 mg/dL (ref 7–22)
CALCIUM: 9.1 mg/dL (ref 8.0–10.3)
CHLORIDE: 101 meq/L (ref 98–108)
CO2: 27 mEq/L (ref 18–33)
Creat: 1.1 mg/dl (ref 0.6–1.2)
GLUCOSE: 89 mg/dL (ref 73–118)
POTASSIUM: 3.6 meq/L (ref 3.3–4.7)
SODIUM: 139 meq/L (ref 128–145)
TOTAL PROTEIN: 5.7 g/dL — AB (ref 6.4–8.1)
Total Bilirubin: 0.8 mg/dl (ref 0.20–1.60)

## 2014-09-10 LAB — CBC WITH DIFFERENTIAL (CANCER CENTER ONLY)
BASO#: 0 10*3/uL (ref 0.0–0.2)
BASO%: 0.5 % (ref 0.0–2.0)
EOS%: 0.1 % (ref 0.0–7.0)
Eosinophils Absolute: 0 10*3/uL (ref 0.0–0.5)
HEMATOCRIT: 38.8 % (ref 38.7–49.9)
HEMOGLOBIN: 13 g/dL (ref 13.0–17.1)
LYMPH#: 2 10*3/uL (ref 0.9–3.3)
LYMPH%: 25.7 % (ref 14.0–48.0)
MCH: 27.7 pg — AB (ref 28.0–33.4)
MCHC: 33.5 g/dL (ref 32.0–35.9)
MCV: 83 fL (ref 82–98)
MONO#: 0.6 10*3/uL (ref 0.1–0.9)
MONO%: 8.4 % (ref 0.0–13.0)
NEUT#: 5 10*3/uL (ref 1.5–6.5)
NEUT%: 65.3 % (ref 40.0–80.0)
Platelets: 188 10*3/uL (ref 145–400)
RBC: 4.69 10*6/uL (ref 4.20–5.70)
RDW: 24 % — AB (ref 11.1–15.7)
WBC: 7.6 10*3/uL (ref 4.0–10.0)

## 2014-09-10 LAB — TECHNOLOGIST REVIEW CHCC SATELLITE: Tech Review: 2

## 2014-09-10 LAB — FERRITIN CHCC: Ferritin: 124 ng/ml (ref 22–316)

## 2014-09-10 LAB — IRON AND TIBC CHCC
%SAT: 25 % (ref 20–55)
Iron: 69 ug/dL (ref 42–163)
TIBC: 281 ug/dL (ref 202–409)
UIBC: 212 ug/dL (ref 117–376)

## 2014-09-10 MED ORDER — HEPARIN SOD (PORK) LOCK FLUSH 100 UNIT/ML IV SOLN
500.0000 [IU] | Freq: Once | INTRAVENOUS | Status: AC
  Administered 2014-09-10: 500 [IU] via INTRAVENOUS
  Filled 2014-09-10: qty 5

## 2014-09-10 MED ORDER — SODIUM CHLORIDE 0.9 % IV SOLN: Freq: Once | INTRAVENOUS | Status: DC

## 2014-09-10 MED ORDER — DEXTROSE 5 % IV SOLN
INTRAVENOUS | Status: DC
  Administered 2014-09-10: 11:00:00 via INTRAVENOUS

## 2014-09-10 MED ORDER — SODIUM CHLORIDE 0.9 % IJ SOLN
10.0000 mL | INTRAMUSCULAR | Status: DC | PRN
  Administered 2014-09-10: 10 mL via INTRAVENOUS
  Filled 2014-09-10: qty 10

## 2014-09-10 NOTE — Patient Instructions (Signed)
Altered Mental Status °Altered mental status most often refers to an abnormal change in your responsiveness and awareness. It can affect your speech, thought, mobility, memory, attention span, or alertness. It can range from slight confusion to complete unresponsiveness (coma). Altered mental status can be a sign of a serious underlying medical condition. Rapid evaluation and medical treatment is necessary for patients having an altered mental status. °CAUSES  °· Low blood sugar (hypoglycemia) or diabetes. °· Severe loss of body fluids (dehydration) or a body salt (electrolyte) imbalance. °· A stroke or other neurologic problem, such as dementia or delirium. °· A head injury or tumor. °· A drug or alcohol overdose. °· Exposure to toxins or poisons. °· Depression, anxiety, and stress. °· A low oxygen level (hypoxia). °· An infection. °· Blood loss. °· Twitching or shaking (seizure). °· Heart problems, such as heart attack or heart rhythm problems (arrhythmias). °· A body temperature that is too low or too high (hypothermia or hyperthermia). °DIAGNOSIS  °A diagnosis is based on your history, symptoms, physical and neurologic examinations, and diagnostic tests. Diagnostic tests may include: °· Measurement of your blood pressure, pulse, breathing, and oxygen levels (vital signs). °· Blood tests. °· Urine tests. °· X-ray exams. °· A computerized magnetic scan (magnetic resonance imaging, MRI). °· A computerized X-ray scan (computed tomography, CT scan). °TREATMENT  °Treatment will depend on the cause. Treatment may include: °· Management of an underlying medical or mental health condition. °· Critical care or support in the hospital. °HOME CARE INSTRUCTIONS  °· Only take over-the-counter or prescription medicines for pain, discomfort, or fever as directed by your caregiver. °· Manage underlying conditions as directed by your caregiver. °· Eat a healthy, well-balanced diet to maintain strength. °· Join a support group or  prevention program to cope with the condition or trauma that caused the altered mental status. Ask your caregiver to help choose a program that works for you. °· Follow up with your caregiver for further examination, therapy, or testing as directed. °SEEK MEDICAL CARE IF:  °· You feel unwell or have chills. °· You or your family notice a change in your behavior or your alertness. °· You have trouble following your caregiver's treatment plan. °· You have questions or concerns. °SEEK IMMEDIATE MEDICAL CARE IF:  °· You have a rapid heartbeat or have chest pain. °· You have difficulty breathing. °· You have a fever. °· You have a headache with a stiff neck. °· You cough up blood. °· You have blood in your urine or stool. °· You have severe agitation or confusion. °MAKE SURE YOU:  °· Understand these instructions. °· Will watch your condition. °· Will get help right away if you are not doing well or get worse. °Document Released: 06/02/2010 Document Revised: 03/06/2012 Document Reviewed: 06/02/2010 °ExitCare® Patient Information ©2015 [employer]. This information is not intended to replace advice given to you by your health care provider. Make sure you discuss any questions you have with your health care provider. ° °Dehydration, Adult °Dehydration is when you lose more fluids from the body than you take in. Vital organs like the kidneys, brain, and heart cannot function without a proper amount of fluids and salt. Any loss of fluids from the body can cause dehydration.  °CAUSES  °· Vomiting. °· Diarrhea. °· Excessive sweating. °· Excessive urine output. °· Fever. °SYMPTOMS  °Mild dehydration °· Thirst. °· Dry lips. °· Slightly dry mouth. °Moderate dehydration °· Very dry mouth. °· Sunken eyes. °· Skin does not   bounce back quickly when lightly pinched and released. °· Dark urine and decreased urine production. °· Decreased tear production. °· Headache. °Severe dehydration °· Very dry mouth. °· Extreme thirst. °· Rapid,  weak pulse (more than 100 beats per minute at rest). °· Cold hands and feet. °· Not able to sweat in spite of heat and temperature. °· Rapid breathing. °· Blue lips. °· Confusion and lethargy. °· Difficulty being awakened. °· Minimal urine production. °· No tears. °DIAGNOSIS  °Your caregiver will diagnose dehydration based on your symptoms and your exam. Blood and urine tests will help confirm the diagnosis. The diagnostic evaluation should also identify the cause of dehydration. °TREATMENT  °Treatment of mild or moderate dehydration can often be done at home by increasing the amount of fluids that you drink. It is best to drink small amounts of fluid more often. Drinking too much at one time can make vomiting worse. Refer to the home care instructions below. °Severe dehydration needs to be treated at the hospital where you will probably be given intravenous (IV) fluids that contain water and electrolytes. °HOME CARE INSTRUCTIONS  °· Ask your caregiver about specific rehydration instructions. °· Drink enough fluids to keep your urine clear or pale yellow. °· Drink small amounts frequently if you have nausea and vomiting. °· Eat as you normally do. °· Avoid: °¨ Foods or drinks high in sugar. °¨ Carbonated drinks. °¨ Juice. °¨ Extremely hot or cold fluids. °¨ Drinks with caffeine. °¨ Fatty, greasy foods. °¨ Alcohol. °¨ Tobacco. °¨ Overeating. °¨ Gelatin desserts. °· Wash your hands well to avoid spreading bacteria and viruses. °· Only take over-the-counter or prescription medicines for pain, discomfort, or fever as directed by your caregiver. °· Ask your caregiver if you should continue all prescribed and over-the-counter medicines. °· Keep all follow-up appointments with your caregiver. °SEEK MEDICAL CARE IF: °· You have abdominal pain and it increases or stays in one area (localizes). °· You have a rash, stiff neck, or severe headache. °· You are irritable, sleepy, or difficult to awaken. °· You are weak, dizzy, or  extremely thirsty. °SEEK IMMEDIATE MEDICAL CARE IF:  °· You are unable to keep fluids down or you get worse despite treatment. °· You have frequent episodes of vomiting or diarrhea. °· You have blood or green matter (bile) in your vomit. °· You have blood in your stool or your stool looks black and tarry. °· You have not urinated in 6 to 8 hours, or you have only urinated a small amount of very dark urine. °· You have a fever. °· You faint. °MAKE SURE YOU:  °· Understand these instructions. °· Will watch your condition. °· Will get help right away if you are not doing well or get worse. °Document Released: 12/13/2005 Document Revised: 03/06/2012 Document Reviewed: 08/02/2011 °ExitCare® Patient Information ©2015 [employer]. This information is not intended to replace advice given to you by your health care provider. Make sure you discuss any questions you have with your health care provider. ° °

## 2014-09-10 NOTE — Progress Notes (Signed)
Willie Owens  [phone] Fax:(336) [phone]  ID: Willie Owens OB: 1951/06/10 MR#: [id number] ONG#:[id number] Patient Care Team: Willie Noon, MD as PCP - General (Family Medicine)  DIAGNOSIS: Metastatic adenocarcinoma of the distal esophagus  INTERVAL HISTORY: Willie Owens is here today for follow-up after his admission into the hospital last week for amnesia. He has been less verbal only repeating "sure" when asked questions. He has not been eating or drinking fluids. He has lost 4 more lbs. His treatment last week was held. His diarrhea is less. He has had no bleeding or pain. He has a dry cough. His wife states that he has had no fever, chills, rash, dizziness, headache, SOB, chest pain, palpitations, abdominal pain, constipation, diarrhea, blood in urine or stool. His EEG last week didn't show any seizure activity. His PET scan showed some improvement. He has been unable to work lately and his wife is concerned that he is going to need insurance. She also needs to get him set up on disability for now. I spoke with Willie Owens about this and she is going to meet with them today and also set them up to meet with Willie Owens at [facility]. He has had no swelling, tenderness, numbness or tingling in his extremities. The are planning a trip to the beach next week and are excited about that.       CURRENT TREATMENT: Oxaliplatin/epirubicin/Xeloda (on hold for now)  REVIEW OF SYSTEMS: All other 10 point review of systems is negative except for those issues mentioned above.    PAST MEDICAL HISTORY: Past Medical History  Diagnosis Date  . Atrial fibrillation     with RVR while hospitalized in 2014  . Hyperthyroidism     thyrotoxicosis , graves  . H/O [facility] spotted fever   . Gout   . History of antinuclear antibodies   . GERD (gastroesophageal reflux disease)   . Cancer 06/2014    esopageal ca-7/15  . Esophageal cancer 07/26/2014   PAST SURGICAL HISTORY: Past  Surgical History  Procedure Laterality Date  . Leg surgery      Fx, rod removed  . Transthoracic echocardiogram  4/292014    EF 55-60%; severe bi-atrial enlargement; RV mod dilated  . Cardioversion N/A 09/21/2013    Procedure: CARDIOVERSION;  Surgeon: Willie Casino, MD;  Location: [facility] ENDOSCOPY;  Service: Cardiovascular;  Laterality: N/A;  . Tonsillectomy and adenoidectomy     FAMILY HISTORY Family History  Problem Relation Age of Onset  . Pulmonary embolism Neg Hx   . CAD Neg Hx   . Colon cancer Neg Hx   . Stomach cancer Neg Hx   . Esophageal cancer Neg Hx   . Diabetes Mother    SOCIAL HISTORY:  History   Social History  . Marital Status: Married    Spouse Name: Willie Owens    Number of Children: 3  . Years of Education: N/A   Occupational History  . sales    Social History Main Topics  . Smoking status: Former Smoker -- 2.00 packs/day for 34 years    Types: Cigarettes    Start date: 03/19/1971    Quit date: 12/30/2004  . Smokeless tobacco: Never Used     Comment: quit smoking 9 years ago  . Alcohol Use: 1.5 oz/week    3 drink(s) per week     Comment: vodka daily  . Drug Use: No  . Sexual Activity: Not on file   Other Topics Concern  . Not  on file   Social History Narrative  . No narrative on file   ADVANCED DIRECTIVES: <no information>  HEALTH MAINTENANCE: History  Substance Use Topics  . Smoking status: Former Smoker -- 2.00 packs/day for 34 years    Types: Cigarettes    Start date: 03/19/1971    Quit date: 12/30/2004  . Smokeless tobacco: Never Used     Comment: quit smoking 9 years ago  . Alcohol Use: 1.5 oz/week    3 drink(s) per week     Comment: vodka daily   Colonoscopy: PAP: Bone density: Lipid panel:  Allergies  Allergen Reactions  . Penicillins Other (See Comments)    Hallucinations, from childhood   Current Outpatient Prescriptions  Medication Sig Dispense Refill  . aspirin 81 MG tablet Take 81 mg by mouth daily.      . B  Complex-C (SUPER B COMPLEX PO) Take 1 tablet by mouth daily.      . Cholecalciferol (VITAMIN D) 2000 UNITS tablet Take 2,000 Units by mouth daily.      Marland Kitchen dexamethasone (DECADRON) 4 MG tablet Take 8 mg by mouth 3 (three) times daily. Start the day after chemotherapy for 3 days.      Marland Kitchen esomeprazole (NEXIUM) 40 MG capsule Take 40 mg by mouth 2 (two) times daily before a meal.      . fluconazole (DIFLUCAN) 100 MG tablet Take 1 tablet (100 mg total) by mouth daily.  30 tablet  4  . lansoprazole (PREVACID SOLUTAB) 30 MG disintegrating tablet Take 30 mg by mouth 2 (two) times daily.      Marland Kitchen levofloxacin (LEVAQUIN) 500 MG tablet Take 1 tablet (500 mg total) by mouth daily.  5 tablet  0  . lidocaine-prilocaine (EMLA) cream Apply 1 application topically as needed.  30 g  10  . LORazepam (ATIVAN) 0.5 MG tablet Take 1 tablet (0.5 mg total) by mouth every 6 (six) hours as needed (nausea and vomitting).  30 tablet  0  . methimazole (TAPAZOLE) 5 MG tablet Take 2.5 mg by mouth daily. TAKES 1/2 TAB = 2.5      . ondansetron (ZOFRAN) 8 MG tablet Take 1 tablet (8 mg total) by mouth 2 (two) times daily. Start the day after chemo for 3 days. Then take as needed for nausea or vomiting.  30 tablet  1  . potassium chloride SA (K-DUR,KLOR-CON) 20 MEQ tablet Take 2 tablets twice a day for 5 days, then take 1 tablet twice a day  60 tablet  4  . PRESCRIPTION MEDICATION Chemotherapy at [facility]      . prochlorperazine (COMPAZINE) 10 MG tablet Take 1 tablet (10 mg total) by mouth every 6 (six) hours as needed (Nausea or vomiting).  30 tablet  1  . propranolol ER (INDERAL LA) 80 MG 24 hr capsule Take 80 mg by mouth at bedtime.      Marland Kitchen venlafaxine (EFFEXOR) 37.5 MG tablet Take 1 tablet (37.5 mg total) by mouth 2 (two) times daily with a meal.  60 tablet  6   No current facility-administered medications for this visit.   Facility-Administered Medications Ordered in Other Visits  Medication Dose Route Frequency Provider Last Rate Last  Dose  . 0.9 %  sodium chloride infusion   Intravenous Continuous Willie Napoleon, MD       OBJECTIVE: Filed Vitals:   09/10/14 0922  BP: 113/71  Pulse: 100  Temp: 97.3 F (36.3 C)  Resp: 20   Body mass index is 22.84  kg/(m^2). ECOG FS:1 - Symptomatic but completely ambulatory Ocular: Sclerae unicteric, pupils equal, round and reactive to light Ear-nose-throat: Oropharynx clear, dentition fair Lymphatic: No cervical or supraclavicular adenopathy Lungs no rales or rhonchi, good excursion bilaterally Heart regular rate and rhythm, no murmur appreciated Abd soft, nontender, positive bowel sounds MSK no focal spinal tenderness, no joint edema Neuro: non-focal, well-oriented, appropriate affect  LAB RESULTS: CMP     Component Value Date/Time   NA 139 09/10/2014 0902   NA 130* 09/04/2014 0417   K 3.6 09/10/2014 0902   K 4.0 09/04/2014 0417   CL 101 09/10/2014 0902   CL 95* 09/04/2014 0417   CO2 27 09/10/2014 0902   CO2 26 09/04/2014 0417   GLUCOSE 89 09/10/2014 0902   GLUCOSE 95 09/04/2014 0417   BUN 21 09/10/2014 0902   BUN 22 09/04/2014 0417   CREATININE 1.1 09/10/2014 0902   CREATININE 0.79 09/04/2014 0417   CALCIUM 9.1 09/10/2014 0902   CALCIUM 8.2* 09/04/2014 0417   PROT 5.7* 09/10/2014 0902   PROT 4.7* 09/04/2014 0417   ALBUMIN 2.2* 09/04/2014 0417   AST 25 09/10/2014 0902   AST 16 09/04/2014 0417   ALT 38 09/10/2014 0902   ALT 21 09/04/2014 0417   ALKPHOS 37 09/10/2014 0902   ALKPHOS 35* 09/04/2014 0417   BILITOT 0.80 09/10/2014 0902   BILITOT 0.5 09/04/2014 0417   GFRNONAA >90 09/04/2014 0417   GFRAA >90 09/04/2014 0417   No results found for this basename: SPEP, UPEP,  kappa and lambda light chains   Lab Results  Component Value Date   WBC 7.6 09/10/2014   NEUTROABS 5.0 09/10/2014   HGB 13.0 09/10/2014   HCT 38.8 09/10/2014   MCV 83 09/10/2014   PLT 188 09/10/2014   No results found for this basename: LABCA2   No components found with this basename: [id number]   No results found for this basename:  INR,  in the last 168 hours  STUDIES: Nm Pet Image Restag (ps) Skull Base To Thigh  09/09/2014   CLINICAL DATA:  Subsequent treatment strategy for metastatic esophageal cancer.  EXAM: NUCLEAR MEDICINE PET SKULL BASE TO THIGH  TECHNIQUE: 9.08 mCi F-18 FDG was injected intravenously. Full-ring PET imaging was performed from the skull base to thigh after the radiotracer. CT data was obtained and used for attenuation correction and anatomic localization.  FASTING BLOOD GLUCOSE:  Value: 120 mg/dl  COMPARISON:  None.  FINDINGS: NECK  No hypermetabolic lymph nodes in the neck.  CHEST  Patchy, ill-defined nodular densities within both lung bases are new from previous exam. There is associated increased FDG uptake with these nodular densities within SUV max measuring up to 2.0. The CT appearance favors sequelae of inflammation or infection or perhaps aspiration. The upper lobes are clear.  No hypermetabolic mediastinal or hilar lymph nodes identified. There has been resolution of abnormal hypermetabolism associated with the distal esophageal tumor.  ABDOMEN/PELVIS  Enlarged gastrohepatic ligament lymph node measures 1.5 cm and has an SUV max equal to 4.1. On the previous exam this lymph node measured 2.9 cm and had an SUV max equal to 9.6. There has been resolution of the other hypermetabolic and enlarged perigastric and periesophageal lymph nodes. Periaortic lymph node measures 0.8 cm and has an SUV max equal to 4.7. Previously this lymph node measure 1.2 cm and had an SUV max equal to 12.6. Retrocaval lymph node measures 6 mm and has an SUV max equal to 2.76. This is compared with  1.5 cm and SUV max of 12.7, previously.  No abnormal FDG uptake is identified within the liver, spleen or pancreas. The adrenal glands are unremarkable.  SKELETON  No focal hypermetabolic activity to suggest skeletal metastasis.  IMPRESSION: 1. Interval partial response to therapy. 2. Resolution of abnormal hyper metabolism associated with  distal esophageal tumor. Additionally, there has been significant improvement an enlarged and hypermetabolic upper abdominal lymph nodes. 3. Persistent pathologically enlarged gastrohepatic ligament lymph node with malignant range FDG uptake. There is also improved but persistent FDG uptake associated with sub cm periaortic lymph node. 4. Multiple, ill-defined nodular densities within the posterior lung bases are likely sequelae of inflammation. Perhaps related to aspiration.   Electronically Signed   By: Willie Owens M.D.   On: 09/09/2014 15:55   ASSESSMENT/PLAN: Willie Owens is a 63 year old gentleman with stage IV adenocarcinoma of the distal esophagus. We had him on chemotherapy with EOX. We held his treatment last week and will continue to hold off on treating him for the time being. He is still very out of it and only gives one word answers to questions and is unable to remember his children's names. Hopefully this will begin to wear off.  His labs today look ok as did is labs during his hospital admission.  His PET scan showed improvement and his EEG did not show any seizure activity.   He has not been eating or drinking and as a result hasn't had any of his normal medications. i told his wife to crush his pills and put them in apple sauce for now until he is able to swallow again.   They will meet with Willie Owens today to discuss his disability and insurance and then they will meet with Abigail.  We will see him back in 3 weeks for follow-up and labs. They know to call here with any questions or concerns and to go to the ED in the event of an emergency. We can certainly see him sooner if need be.  Willie Bottom, NP 09/10/2014 10:45 AM

## 2014-09-10 NOTE — Addendum Note (Signed)
Addended by: Burney Gauze R on: 09/10/2014 12:34 PM   Modules accepted: Orders, Medications

## 2014-09-12 ENCOUNTER — Other Ambulatory Visit: Payer: Managed Care, Other (non HMO) | Admitting: Lab

## 2014-09-12 ENCOUNTER — Ambulatory Visit: Payer: Managed Care, Other (non HMO)

## 2014-09-12 ENCOUNTER — Ambulatory Visit: Payer: Managed Care, Other (non HMO) | Admitting: Family

## 2014-09-20 ENCOUNTER — Encounter: Payer: Self-pay | Admitting: *Deleted

## 2014-09-20 ENCOUNTER — Encounter: Payer: Self-pay | Admitting: Nurse Practitioner

## 2014-09-20 NOTE — Progress Notes (Signed)
Spoke w/pt's wife. She stated he was greatly improving. He is eating at 110%, his memory is slowly recovering and he is now able to recognize and identify areas that he was having trouble with a few weeks ago. She stated she would definitely give [place] a call if any issues arose. She verbalized her appreciation for the office staff and Dr. Marin Olp for all the assistance and care over the past weeks.

## 2014-09-20 NOTE — Progress Notes (Signed)
[facility]  Clinical Social Work was referred by patient's wife for assessment of psychosocial needs due to financial concerns.  Clinical Social Worker spoke with wife over the phone to offer support and assess for needs. CSW educated her on how to apply for ss disability. Pt does not have any disability benefits through his work. Wife also shared concerns that pt's insurance coverage would be cancelled soon due to missing work. CSW discussed possible resources through the cancer center and grant funds. Pt may not qualify currently due to recently working, but in the months ahead he may. CSW educated wife on how to obtain through Development worker, community. CSW not aware of diagnosis specific resources currently. Family does not appear to qualify for public assistance currently as well. Emotional support provided to wife. They plan to go on a trip with the whole family next week in order to have time together. Wife aware of how to reach CSW. CSW to continue to seek additional resources.     Clinical Social Work interventions: Resource education Supportive listening  Loren Racer, [place] Worker [place]  [place] Phone: [phone] Fax: [phone]

## 2014-09-21 ENCOUNTER — Other Ambulatory Visit: Payer: Self-pay | Admitting: Internal Medicine

## 2014-09-23 ENCOUNTER — Encounter: Payer: Self-pay | Admitting: Internal Medicine

## 2014-10-03 ENCOUNTER — Encounter: Payer: Self-pay | Admitting: Hematology & Oncology

## 2014-10-03 ENCOUNTER — Ambulatory Visit: Payer: Managed Care, Other (non HMO)

## 2014-10-03 ENCOUNTER — Other Ambulatory Visit: Payer: Managed Care, Other (non HMO) | Admitting: Lab

## 2014-10-03 ENCOUNTER — Ambulatory Visit: Payer: Managed Care, Other (non HMO) | Admitting: Hematology & Oncology

## 2014-10-03 ENCOUNTER — Ambulatory Visit (HOSPITAL_BASED_OUTPATIENT_CLINIC_OR_DEPARTMENT_OTHER): Payer: 59

## 2014-10-03 ENCOUNTER — Telehealth: Payer: Self-pay | Admitting: Hematology & Oncology

## 2014-10-03 ENCOUNTER — Ambulatory Visit (HOSPITAL_BASED_OUTPATIENT_CLINIC_OR_DEPARTMENT_OTHER): Payer: 59 | Admitting: Hematology & Oncology

## 2014-10-03 ENCOUNTER — Other Ambulatory Visit (HOSPITAL_BASED_OUTPATIENT_CLINIC_OR_DEPARTMENT_OTHER): Payer: 59 | Admitting: Lab

## 2014-10-03 VITALS — BP 113/58 | HR 73 | Temp 97.5°F | Resp 18 | Ht 72.0 in | Wt 202.0 lb

## 2014-10-03 VITALS — BP 128/68 | HR 71 | Temp 98.0°F | Resp 16

## 2014-10-03 DIAGNOSIS — C159 Malignant neoplasm of esophagus, unspecified: Secondary | ICD-10-CM

## 2014-10-03 DIAGNOSIS — C155 Malignant neoplasm of lower third of esophagus: Secondary | ICD-10-CM

## 2014-10-03 DIAGNOSIS — J189 Pneumonia, unspecified organism: Secondary | ICD-10-CM

## 2014-10-03 DIAGNOSIS — Z5111 Encounter for antineoplastic chemotherapy: Secondary | ICD-10-CM

## 2014-10-03 LAB — CBC WITH DIFFERENTIAL (CANCER CENTER ONLY)
BASO#: 0.1 10*3/uL (ref 0.0–0.2)
BASO%: 0.9 % (ref 0.0–2.0)
EOS%: 2.1 % (ref 0.0–7.0)
Eosinophils Absolute: 0.1 10*3/uL (ref 0.0–0.5)
HCT: 32.5 % — ABNORMAL LOW (ref 38.7–49.9)
HGB: 10.4 g/dL — ABNORMAL LOW (ref 13.0–17.1)
LYMPH#: 1.6 10*3/uL (ref 0.9–3.3)
LYMPH%: 28 % (ref 14.0–48.0)
MCH: 28.8 pg (ref 28.0–33.4)
MCHC: 32 g/dL (ref 32.0–35.9)
MCV: 90 fL (ref 82–98)
MONO#: 0.6 10*3/uL (ref 0.1–0.9)
MONO%: 11.2 % (ref 0.0–13.0)
NEUT#: 3.3 10*3/uL (ref 1.5–6.5)
NEUT%: 57.8 % (ref 40.0–80.0)
Platelets: 286 10*3/uL (ref 145–400)
RBC: 3.61 10*6/uL — ABNORMAL LOW (ref 4.20–5.70)
RDW: 23.6 % — ABNORMAL HIGH (ref 11.1–15.7)
WBC: 5.6 10*3/uL (ref 4.0–10.0)

## 2014-10-03 LAB — COMPREHENSIVE METABOLIC PANEL
ALK PHOS: 69 U/L (ref 39–117)
ALT: 12 U/L (ref 0–53)
AST: 16 U/L (ref 0–37)
Albumin: 3.4 g/dL — ABNORMAL LOW (ref 3.5–5.2)
BUN: 13 mg/dL (ref 6–23)
CO2: 26 mEq/L (ref 19–32)
Calcium: 8.7 mg/dL (ref 8.4–10.5)
Chloride: 102 mEq/L (ref 96–112)
Creatinine, Ser: 0.73 mg/dL (ref 0.50–1.35)
Glucose, Bld: 112 mg/dL — ABNORMAL HIGH (ref 70–99)
Potassium: 4.4 mEq/L (ref 3.5–5.3)
SODIUM: 137 meq/L (ref 135–145)
TOTAL PROTEIN: 5.7 g/dL — AB (ref 6.0–8.3)
Total Bilirubin: 0.6 mg/dL (ref 0.2–1.2)

## 2014-10-03 LAB — IRON AND TIBC CHCC
%SAT: 14 % — ABNORMAL LOW (ref 20–55)
Iron: 40 ug/dL — ABNORMAL LOW (ref 42–163)
TIBC: 279 ug/dL (ref 202–409)
UIBC: 239 ug/dL (ref 117–376)

## 2014-10-03 LAB — FERRITIN CHCC: Ferritin: 35 ng/ml (ref 22–316)

## 2014-10-03 MED ORDER — DEXAMETHASONE SODIUM PHOSPHATE 20 MG/5ML IJ SOLN
INTRAMUSCULAR | Status: AC
  Filled 2014-10-03: qty 5

## 2014-10-03 MED ORDER — SODIUM CHLORIDE 0.9 % IV SOLN
150.0000 mg | Freq: Once | INTRAVENOUS | Status: AC
  Administered 2014-10-03: 150 mg via INTRAVENOUS
  Filled 2014-10-03: qty 5

## 2014-10-03 MED ORDER — ONDANSETRON HCL 8 MG PO TABS: 8.0000 mg | ORAL_TABLET | Freq: Two times a day (BID) | ORAL | Status: DC | PRN

## 2014-10-03 MED ORDER — DEXAMETHASONE SODIUM PHOSPHATE 20 MG/5ML IJ SOLN
12.0000 mg | Freq: Once | INTRAMUSCULAR | Status: AC
  Administered 2014-10-03: 12 mg via INTRAVENOUS

## 2014-10-03 MED ORDER — SODIUM CHLORIDE 0.9 % IV SOLN
Freq: Once | INTRAVENOUS | Status: AC
  Administered 2014-10-03: 11:00:00 via INTRAVENOUS

## 2014-10-03 MED ORDER — HEPARIN SOD (PORK) LOCK FLUSH 100 UNIT/ML IV SOLN
500.0000 [IU] | Freq: Once | INTRAVENOUS | Status: AC | PRN
  Administered 2014-10-03: 500 [IU]
  Filled 2014-10-03: qty 5

## 2014-10-03 MED ORDER — PALONOSETRON HCL INJECTION 0.25 MG/5ML
INTRAVENOUS | Status: AC
  Filled 2014-10-03: qty 5

## 2014-10-03 MED ORDER — LEVOFLOXACIN 500 MG PO TABS: 500.0000 mg | ORAL_TABLET | Freq: Every day | ORAL | Status: DC

## 2014-10-03 MED ORDER — CISPLATIN CHEMO INJECTION 100MG/100ML
60.0000 mg/m2 | Freq: Once | INTRAVENOUS | Status: AC
  Administered 2014-10-03: 130 mg via INTRAVENOUS
  Filled 2014-10-03: qty 130

## 2014-10-03 MED ORDER — PALONOSETRON HCL INJECTION 0.25 MG/5ML
0.2500 mg | Freq: Once | INTRAVENOUS | Status: AC
  Administered 2014-10-03: 0.25 mg via INTRAVENOUS

## 2014-10-03 MED ORDER — POTASSIUM CHLORIDE 2 MEQ/ML IV SOLN
Freq: Once | INTRAVENOUS | Status: AC
  Administered 2014-10-03: 11:00:00 via INTRAVENOUS
  Filled 2014-10-03: qty 10

## 2014-10-03 MED ORDER — DOCETAXEL CHEMO INJECTION 160 MG/16ML
60.0000 mg/m2 | Freq: Once | INTRAVENOUS | Status: AC
  Administered 2014-10-03: 130 mg via INTRAVENOUS
  Filled 2014-10-03: qty 13

## 2014-10-03 MED ORDER — SODIUM CHLORIDE 0.9 % IJ SOLN
10.0000 mL | INTRAMUSCULAR | Status: DC | PRN
  Administered 2014-10-03: 10 mL
  Filled 2014-10-03: qty 10

## 2014-10-03 NOTE — Progress Notes (Signed)
Hematology and Oncology Follow Up Visit  Willie Owens [id number] 1951/11/16 63 y.o. 10/03/2014   Principle Diagnosis:  Metastatic adenocarcinoma of the distal esophagus  Current Therapy:   Status post cycle 2 of oxaliplatin/epirubicin/Xeloda     Interim History:  Willie Owens is back for followup. He really has had a very tough time with treatment. He actually was hospitalized a couple times. He had terrible diarrhea. This was even with reduced dose so lota. The big problem however was the fact that he had amnesia. Get total amnesia. He had no idea that he had cancer. Had no idea that he had treatment. He did not know who I was. He was seen by neurology. He had scans done. MRI was negative. His whole workup was negative.  This is gotten better. I still cannot figure out what happened. The only chemotherapy drug that might have caused this was the oxaliplatin.  I think that with all the problems that he has had with treatment, we are going to have to change treatment. I do think that his quality of life is really suffering because of his chemotherapy.  Thankfully, he has responded to treatment. When he was hospitalized, we did do some scans on him. A followup PET scan done in mid September showed that there was resolution of tumor in the distal esophagus. He had improvement in adenopathy. He had persistent gastrohepatic lymph nodes. He had no areas of new disease.  He is swallowing pretty much anything he wants now.  His memory is about 80% according to his wife.  The got back from a vacation at the beach. They had a really good time.  Currently, his performance status is ECOG 1 Medications: Current outpatient prescriptions:B Complex-C (SUPER B COMPLEX PO), Take 1 tablet by mouth daily., Disp: , Rfl: ;  Cholecalciferol (VITAMIN D) 2000 UNITS tablet, Take 2,000 Units by mouth daily., Disp: , Rfl: ;  esomeprazole (NEXIUM) 40 MG capsule, Take 40 mg by mouth 2 (two) times daily before a  meal., Disp: , Rfl: ;  lidocaine-prilocaine (EMLA) cream, Apply 1 application topically as needed., Disp: 30 g, Rfl: 10 Multiple Vitamins-Minerals (MULTIVITAMIN GUMMIES ADULT PO), Take by mouth every morning., Disp: , Rfl: ;  propranolol ER (INDERAL LA) 80 MG 24 hr capsule, Take 80 mg by mouth at bedtime., Disp: , Rfl: ;  capecitabine (XELODA) 500 MG tablet, Take 1,500 mg by mouth 2 (two) times daily after a meal., Disp: , Rfl:  dexamethasone (DECADRON) 4 MG tablet, Take 8 mg by mouth 2 (two) times daily with a meal. Start the day after chemo for three days, Disp: , Rfl: ;  diphenoxylate-atropine (LOMOTIL) 2.5-0.025 MG per tablet, Take 1 tablet by mouth 4 (four) times daily as needed for diarrhea or loose stools., Disp: , Rfl: ;  levofloxacin (LEVAQUIN) 500 MG tablet, Take 1 tablet (500 mg total) by mouth daily., Disp: 15 tablet, Rfl: 4 LORazepam (ATIVAN) 0.5 MG tablet, Take 0.5 mg by mouth every 6 (six) hours as needed (nausea/vomitting)., Disp: , Rfl: ;  ondansetron (ZOFRAN) 8 MG tablet, Take 1 tablet (8 mg total) by mouth 2 (two) times daily as needed (Nausea or vomiting). Begin 4 days after chemotherapy., Disp: 30 tablet, Rfl: 1;  potassium chloride SA (K-DUR,KLOR-CON) 20 MEQ tablet, Take 20 mEq by mouth 2 (two) times daily., Disp: , Rfl:  prochlorperazine (COMPAZINE) 10 MG tablet, Take 10 mg by mouth every 6 (six) hours as needed for nausea or vomiting., Disp: , Rfl: ;  venlafaxine (EFFEXOR) 37.5 MG tablet, Take 37.5 mg by mouth 2 (two) times daily., Disp: , Rfl:  No current facility-administered medications for this visit. Facility-Administered Medications Ordered in Other Visits: sodium chloride 0.9 % injection 10 mL, 10 mL, Intracatheter, PRN, Peter R Ennever, MD, 10 mL at 10/03/14 1722  Allergies:  Allergies  Allergen Reactions  . Penicillins Other (See Comments)    Hallucinations, from childhood    Past Medical History, Surgical history, Social history, and Family History were reviewed and  updated.  Review of Systems: As above  Physical Exam:  height is 6' (1.829 m) and weight is 202 lb (91.627 kg). His oral temperature is 97.5 F (36.4 C). His blood pressure is 113/58 and his pulse is 73. His respiration is 18.   Well-developed and well-nourished white gentleman. Head and neck exam shows no ocular or oral lesions. He has no palpable cervical or supraclavicular lymph nodes. Lungs are clear. Cardiac exam regular rate and rhythm with no murmurs, rubs or bruits. Abdomen is soft. He has good bowel sounds. There is no fluid wave. There is no palpable liver or spleen tip. Extremities shows no clubbing, cyanosis or edema. Skin exam no rashes ecchymosis or petechia. Neurological exam is nonfocal.  Lab Results  Component Value Date   WBC 5.6 10/03/2014   HGB 10.4* 10/03/2014   HCT 32.5* 10/03/2014   MCV 90 10/03/2014   PLT 286 10/03/2014     Chemistry      Component Value Date/Time   NA 137 10/03/2014 0845   NA 139 09/10/2014 0902   K 4.4 10/03/2014 0845   K 3.6 09/10/2014 0902   CL 102 10/03/2014 0845   CL 101 09/10/2014 0902   CO2 26 10/03/2014 0845   CO2 27 09/10/2014 0902   BUN 13 10/03/2014 0845   BUN 21 09/10/2014 0902   CREATININE 0.73 10/03/2014 0845   CREATININE 1.1 09/10/2014 0902      Component Value Date/Time   CALCIUM 8.7 10/03/2014 0845   CALCIUM 9.1 09/10/2014 0902   ALKPHOS 69 10/03/2014 0845   ALKPHOS 37 09/10/2014 0902   AST 16 10/03/2014 0845   AST 25 09/10/2014 0902   ALT 12 10/03/2014 0845   ALT 38 09/10/2014 0902   BILITOT 0.6 10/03/2014 0845   BILITOT 0.80 09/10/2014 0902         Impression and Plan: Mr. Tranchina is 62-year-old gentleman with metastatic adenocarcinoma of the distal esophagus. Of note, his tumor is HER-2 negative.  We will make a change in his chemotherapy. I just think that we have to make a change so that he can have a better quality of life.  I think switch him over to cisplatin/Taxotere would make sense. I think this would be very  reasonable.  I talked to he and his wife about this. I spent a good 35 minutes with them. I went over the side effects of treatment. I told that he will need prophylactic antibiotics. I will give him some Levaquin to take (500 mg by mouth daily). I also explained the possibility of nausea and vomiting. I also explained that he will need Neulasta.  Again is responding to therapy. He is swallowing better. He is eating what he wants. I just want to try to avoid some of the other side effects that he has had.  We will go ahead and treat him again in 3 weeks. We will then scan him afterwards with another PET scan.  The understand the change in   treatment. They understand why we are going to make a change. They agree to this. ENNEVER,PETER R, MD 10/8/20155:22 PM 

## 2014-10-03 NOTE — Patient Instructions (Addendum)
[facility] Discharge Instructions for Patients Receiving Chemotherapy  Today you received the following chemotherapy agents:  Cisplatin and Taxotere  To help prevent nausea and vomiting after your treatment, we encourage you to take your nausea medication as directed.   If you develop nausea and vomiting that is not controlled by your nausea medication, call the clinic.   BELOW ARE SYMPTOMS THAT SHOULD BE REPORTED IMMEDIATELY:  *FEVER GREATER THAN 100.5 F  *CHILLS WITH OR WITHOUT FEVER  NAUSEA AND VOMITING THAT IS NOT CONTROLLED WITH YOUR NAUSEA MEDICATION  *UNUSUAL SHORTNESS OF BREATH  *UNUSUAL BRUISING OR BLEEDING  TENDERNESS IN MOUTH AND THROAT WITH OR WITHOUT PRESENCE OF ULCERS  *URINARY PROBLEMS  *BOWEL PROBLEMS  UNUSUAL RASH Items with * indicate a potential emergency and should be followed up as soon as possible.  Feel free to call the clinic you have any questions or concerns. The clinic phone number is [phone].  Cisplatin injection What is this medicine? CISPLATIN (SIS pla tin) is a chemotherapy drug. It targets fast dividing cells, like cancer cells, and causes these cells to die. This medicine is used to treat many types of cancer like bladder, ovarian, and testicular cancers. This medicine may be used for other purposes; ask your health care provider or pharmacist if you have questions. COMMON BRAND NAME(S): Platinol, Platinol -AQ What should I tell my health care provider before I take this medicine? They need to know if you have any of these conditions: -blood disorders -hearing problems -kidney disease -recent or ongoing radiation therapy -an unusual or allergic reaction to cisplatin, carboplatin, other chemotherapy, other medicines, foods, dyes, or preservatives -pregnant or trying to get pregnant -breast-feeding How should I use this medicine? This drug is given as an infusion into a vein. It is administered in a hospital or clinic  by a specially trained health care professional. Talk to your pediatrician regarding the use of this medicine in children. Special care may be needed. Overdosage: If you think you have taken too much of this medicine contact a poison control center or emergency room at once. NOTE: This medicine is only for you. Do not share this medicine with others. What if I miss a dose? It is important not to miss a dose. Call your doctor or health care professional if you are unable to keep an appointment. What may interact with this medicine? -dofetilide -foscarnet -medicines for seizures -medicines to increase blood counts like filgrastim, pegfilgrastim, sargramostim -probenecid -pyridoxine used with altretamine -rituximab -some antibiotics like amikacin, gentamicin, neomycin, polymyxin B, streptomycin, tobramycin -sulfinpyrazone -vaccines -zalcitabine Talk to your doctor or health care professional before taking any of these medicines: -acetaminophen -aspirin -ibuprofen -ketoprofen -naproxen This list may not describe all possible interactions. Give your health care provider a list of all the medicines, herbs, non-prescription drugs, or dietary supplements you use. Also tell them if you smoke, drink alcohol, or use illegal drugs. Some items may interact with your medicine. What should I watch for while using this medicine? Your condition will be monitored carefully while you are receiving this medicine. You will need important blood work done while you are taking this medicine. This drug may make you feel generally unwell. This is not uncommon, as chemotherapy can affect healthy cells as well as cancer cells. Report any side effects. Continue your course of treatment even though you feel ill unless your doctor tells you to stop. In some cases, you may be given additional medicines to help with side effects.  Follow all directions for their use. Call your doctor or health care professional for  advice if you get a fever, chills or sore throat, or other symptoms of a cold or flu. Do not treat yourself. This drug decreases your body's ability to fight infections. Try to avoid being around people who are sick. This medicine may increase your risk to bruise or bleed. Call your doctor or health care professional if you notice any unusual bleeding. Be careful brushing and flossing your teeth or using a toothpick because you may get an infection or bleed more easily. If you have any dental work done, tell your dentist you are receiving this medicine. Avoid taking products that contain aspirin, acetaminophen, ibuprofen, naproxen, or ketoprofen unless instructed by your doctor. These medicines may hide a fever. Do not become pregnant while taking this medicine. Women should inform their doctor if they wish to become pregnant or think they might be pregnant. There is a potential for serious side effects to an unborn child. Talk to your health care professional or pharmacist for more information. Do not breast-feed an infant while taking this medicine. Drink fluids as directed while you are taking this medicine. This will help protect your kidneys. Call your doctor or health care professional if you get diarrhea. Do not treat yourself. What side effects may I notice from receiving this medicine? Side effects that you should report to your doctor or health care professional as soon as possible: -allergic reactions like skin rash, itching or hives, swelling of the face, lips, or tongue -signs of infection - fever or chills, cough, sore throat, pain or difficulty passing urine -signs of decreased platelets or bleeding - bruising, pinpoint red spots on the skin, black, tarry stools, nosebleeds -signs of decreased red blood cells - unusually weak or tired, fainting spells, lightheadedness -breathing problems -changes in hearing -gout pain -low blood counts - This drug may decrease the number of white blood  cells, red blood cells and platelets. You may be at increased risk for infections and bleeding. -nausea and vomiting -pain, swelling, redness or irritation at the injection site -pain, tingling, numbness in the hands or feet -problems with balance, movement -trouble passing urine or change in the amount of urine Side effects that usually do not require medical attention (report to your doctor or health care professional if they continue or are bothersome): -changes in vision -loss of appetite -metallic taste in the mouth or changes in taste This list may not describe all possible side effects. Call your doctor for medical advice about side effects. You may report side effects to FDA at 1-800-FDA-1088. Where should I keep my medicine? This drug is given in a hospital or clinic and will not be stored at home. NOTE: This sheet is a summary. It may not cover all possible information. If you have questions about this medicine, talk to your doctor, pharmacist, or health care provider.  2015, Elsevier/Gold Standard. (2008-03-19 14:40:54) Docetaxel injection What is this medicine? DOCETAXEL (doe se TAX el) is a chemotherapy drug. It targets fast dividing cells, like cancer cells, and causes these cells to die. This medicine is used to treat many types of cancers like breast cancer, certain stomach cancers, head and neck cancer, lung cancer, and prostate cancer. This medicine may be used for other purposes; ask your health care provider or pharmacist if you have questions. COMMON BRAND NAME(S): Docefrez, Taxotere What should I tell my health care provider before I take this medicine? They need to  know if you have any of these conditions: -infection (especially a virus infection such as chickenpox, cold sores, or herpes) -liver disease -low blood counts, like low white cell, platelet, or red cell counts -an unusual or allergic reaction to docetaxel, polysorbate 80, other chemotherapy agents, other  medicines, foods, dyes, or preservatives -pregnant or trying to get pregnant -breast-feeding How should I use this medicine? This drug is given as an infusion into a vein. It is administered in a hospital or clinic by a specially trained health care professional. Talk to your pediatrician regarding the use of this medicine in children. Special care may be needed. Overdosage: If you think you have taken too much of this medicine contact a poison control center or emergency room at once. NOTE: This medicine is only for you. Do not share this medicine with others. What if I miss a dose? It is important not to miss your dose. Call your doctor or health care professional if you are unable to keep an appointment. What may interact with this medicine? -cyclosporine -erythromycin -ketoconazole -medicines to increase blood counts like filgrastim, pegfilgrastim, sargramostim -vaccines Talk to your doctor or health care professional before taking any of these medicines: -acetaminophen -aspirin -ibuprofen -ketoprofen -naproxen This list may not describe all possible interactions. Give your health care provider a list of all the medicines, herbs, non-prescription drugs, or dietary supplements you use. Also tell them if you smoke, drink alcohol, or use illegal drugs. Some items may interact with your medicine. What should I watch for while using this medicine? Your condition will be monitored carefully while you are receiving this medicine. You will need important blood work done while you are taking this medicine. This drug may make you feel generally unwell. This is not uncommon, as chemotherapy can affect healthy cells as well as cancer cells. Report any side effects. Continue your course of treatment even though you feel ill unless your doctor tells you to stop. In some cases, you may be given additional medicines to help with side effects. Follow all directions for their use. Call your doctor or  health care professional for advice if you get a fever, chills or sore throat, or other symptoms of a cold or flu. Do not treat yourself. This drug decreases your body's ability to fight infections. Try to avoid being around people who are sick. This medicine may increase your risk to bruise or bleed. Call your doctor or health care professional if you notice any unusual bleeding. Be careful brushing and flossing your teeth or using a toothpick because you may get an infection or bleed more easily. If you have any dental work done, tell your dentist you are receiving this medicine. Avoid taking products that contain aspirin, acetaminophen, ibuprofen, naproxen, or ketoprofen unless instructed by your doctor. These medicines may hide a fever. This medicine contains an alcohol in the product. You may get drowsy or dizzy. Do not drive, use machinery, or do anything that needs mental alertness until you know how this medicine affects you. Do not stand or sit up quickly, especially if you are an older patient. This reduces the risk of dizzy or fainting spells. Avoid alcoholic drinks Do not become pregnant while taking this medicine. Women should inform their doctor if they wish to become pregnant or think they might be pregnant. There is a potential for serious side effects to an unborn child. Talk to your health care professional or pharmacist for more information. Do not breast-feed an infant while taking  this medicine. What side effects may I notice from receiving this medicine? Side effects that you should report to your doctor or health care professional as soon as possible: -allergic reactions like skin rash, itching or hives, swelling of the face, lips, or tongue -low blood counts - This drug may decrease the number of white blood cells, red blood cells and platelets. You may be at increased risk for infections and bleeding. -signs of infection - fever or chills, cough, sore throat, pain or difficulty  passing urine -signs of decreased platelets or bleeding - bruising, pinpoint red spots on the skin, black, tarry stools, nosebleeds -signs of decreased red blood cells - unusually weak or tired, fainting spells, lightheadedness -breathing problems -fast or irregular heartbeat -low blood pressure -mouth sores -nausea and vomiting -pain, swelling, redness or irritation at the injection site -pain, tingling, numbness in the hands or feet -swelling of the ankle, feet, hands -weight gain Side effects that usually do not require medical attention (report to your prescriber or health care professional if they continue or are bothersome): -bone pain -complete hair loss including hair on your head, underarms, pubic hair, eyebrows, and eyelashes -diarrhea -excessive tearing -changes in the color of fingernails -loosening of the fingernails -nausea -muscle pain -red flush to skin -sweating -weak or tired This list may not describe all possible side effects. Call your doctor for medical advice about side effects. You may report side effects to FDA at 1-800-FDA-1088. Where should I keep my medicine? This drug is given in a hospital or clinic and will not be stored at home. NOTE: This sheet is a summary. It may not cover all possible information. If you have questions about this medicine, talk to your doctor, pharmacist, or health care provider.  2015, Elsevier/Gold Standard. (2013-11-08 22:21:02)

## 2014-10-03 NOTE — Telephone Encounter (Signed)
Confirmation  Thank you for completing your registration online. Your card is now ready to use. Your confirmation number is [id number]. Print this page to save it for your records. Simply hand your Neulasta FIRST STEP Program card over to the office staff when you arrive for your Neulasta(pegfilgrastim) treatment. The card can then be used to help cover the cost of your deductible, co-insurance, and/or co-payment for your Neulasta.

## 2014-10-03 NOTE — Telephone Encounter (Signed)
Case APPROVED: [id number] Valid: 10/03/2014 - 10/04/2015 Esophageal cancer - Primary ICD-9-150.9 ICD-10-C15.9    J9171 DOCEtaxel (TAXOTERE) 130 mg in dextrose 5 % 250 mL chemo infusion,  [id number] CISplatin (PLATINOL) 130 mg in sodium chloride 0.9 % 500 mL chemo infusion   NPA - [id number]     Efc: 09/26/2014 to current

## 2014-10-04 ENCOUNTER — Ambulatory Visit (HOSPITAL_BASED_OUTPATIENT_CLINIC_OR_DEPARTMENT_OTHER): Payer: 59

## 2014-10-04 VITALS — BP 114/55 | HR 87 | Temp 98.8°F | Resp 18

## 2014-10-04 DIAGNOSIS — C155 Malignant neoplasm of lower third of esophagus: Secondary | ICD-10-CM

## 2014-10-04 DIAGNOSIS — C159 Malignant neoplasm of esophagus, unspecified: Secondary | ICD-10-CM

## 2014-10-04 DIAGNOSIS — Z5189 Encounter for other specified aftercare: Secondary | ICD-10-CM

## 2014-10-04 MED ORDER — PEGFILGRASTIM INJECTION 6 MG/0.6ML
6.0000 mg | Freq: Once | SUBCUTANEOUS | Status: AC
  Administered 2014-10-04: 6 mg via SUBCUTANEOUS

## 2014-10-04 MED ORDER — PEGFILGRASTIM INJECTION 6 MG/0.6ML
SUBCUTANEOUS | Status: AC
  Filled 2014-10-04: qty 0.6

## 2014-10-04 NOTE — Patient Instructions (Signed)
Filgrastim, G-CSF injection  What is this medicine?  FILGRASTIM, G-CSF (fil GRA stim) is a granulocyte colony-stimulating factor that stimulates the growth of neutrophils, a type of white blood cell (WBC) important in the body's fight against infection. It is used to reduce the incidence of fever and infection in patients with certain types of cancer who are receiving chemotherapy that affects the bone marrow, to stimulate blood cell production for removal of WBCs from the body prior to a bone marrow transplantation, to reduce the incidence of fever and infection in patients who have severe chronic neutropenia, and to improve survival outcomes following high-dose radiation exposure that is toxic to the bone marrow.  This medicine may be used for other purposes; ask your health care provider or pharmacist if you have questions.  COMMON BRAND NAME(S): Neupogen  What should I tell my health care provider before I take this medicine?  They need to know if you have any of these conditions:  -latex allergy  -ongoing radiation therapy  -sickle cell disease  -an unusual or allergic reaction to filgrastim, pegfilgrastim, other medicines, foods, dyes, or preservatives  -pregnant or trying to get pregnant  -breast-feeding  How should I use this medicine?  This medicine is for injection under the skin. If you get this medicine at home, you will be taught how to prepare and give this medicine. Refer to the Instructions for Use that come with your medication packaging. Use exactly as directed. Take your medicine at regular intervals. Do not take your medicine more often than directed.  It is important that you put your used needles and syringes in a special sharps container. Do not put them in a trash can. If you do not have a sharps container, call your pharmacist or healthcare provider to get one.  Talk to your pediatrician regarding the use of this medicine in children. While this drug may be prescribed for children as young  as 7 months for selected conditions, precautions do apply.  Overdosage: If you think you have taken too much of this medicine contact a poison control center or emergency room at once.  NOTE: This medicine is only for you. Do not share this medicine with others.  What if I miss a dose?  It is important not to miss your dose. Call your doctor or health care professional if you miss a dose.  What may interact with this medicine?  This medicine may interact with the following medications:  -medicines that may cause a release of neutrophils, such as lithium  This list may not describe all possible interactions. Give your health care provider a list of all the medicines, herbs, non-prescription drugs, or dietary supplements you use. Also tell them if you smoke, drink alcohol, or use illegal drugs. Some items may interact with your medicine.  What should I watch for while using this medicine?  You may need blood work done while you are taking this medicine.  What side effects may I notice from receiving this medicine?  Side effects that you should report to your doctor or health care professional as soon as possible:  -allergic reactions like skin rash, itching or hives, swelling of the face, lips, or tongue  -dizziness or feeling faint  -fever  -pain, redness, or irritation at site where injected  -pinpoint red spots on the skin  -shortness of breath or breathing problems  -stomach or side pain, or pain at the shoulder  -swelling  -tiredness  -trouble passing urine  -unusual   bleeding or bruising  Side effects that usually do not require medical attention (report to your doctor or health care professional if they continue or are bothersome):  -bone pain  -headache  -muscle pain  This list may not describe all possible side effects. Call your doctor for medical advice about side effects. You may report side effects to FDA at 1-800-FDA-1088.  Where should I keep my medicine?  Keep out of the reach of children.  Store in a  refrigerator between 2 and 8 degrees C (36 and 46 degrees F). Do not freeze. Keep in carton to protect from light. Throw away this medicine if vials or syringes are left out of the refrigerator for more than 24 hours. Throw away any unused medicine after the expiration date.  NOTE: This sheet is a summary. It may not cover all possible information. If you have questions about this medicine, talk to your doctor, pharmacist, or health care provider.   2015, [employer] Standard. (2014-03-29 17:00:01)

## 2014-10-24 ENCOUNTER — Encounter: Payer: Self-pay | Admitting: Family

## 2014-10-24 ENCOUNTER — Other Ambulatory Visit: Payer: Self-pay | Admitting: Family

## 2014-10-24 ENCOUNTER — Ambulatory Visit (HOSPITAL_BASED_OUTPATIENT_CLINIC_OR_DEPARTMENT_OTHER): Payer: 59 | Admitting: Family

## 2014-10-24 ENCOUNTER — Ambulatory Visit (HOSPITAL_BASED_OUTPATIENT_CLINIC_OR_DEPARTMENT_OTHER): Payer: 59

## 2014-10-24 ENCOUNTER — Other Ambulatory Visit (HOSPITAL_BASED_OUTPATIENT_CLINIC_OR_DEPARTMENT_OTHER): Payer: 59 | Admitting: Lab

## 2014-10-24 VITALS — BP 130/60 | HR 98 | Temp 97.4°F | Resp 18 | Ht 72.0 in | Wt 203.0 lb

## 2014-10-24 DIAGNOSIS — Z5111 Encounter for antineoplastic chemotherapy: Secondary | ICD-10-CM

## 2014-10-24 DIAGNOSIS — C159 Malignant neoplasm of esophagus, unspecified: Secondary | ICD-10-CM

## 2014-10-24 DIAGNOSIS — R918 Other nonspecific abnormal finding of lung field: Secondary | ICD-10-CM

## 2014-10-24 DIAGNOSIS — G4700 Insomnia, unspecified: Secondary | ICD-10-CM

## 2014-10-24 DIAGNOSIS — J189 Pneumonia, unspecified organism: Secondary | ICD-10-CM

## 2014-10-24 LAB — CMP (CANCER CENTER ONLY)
ALT(SGPT): 16 U/L (ref 10–47)
AST: 14 U/L (ref 11–38)
Albumin: 3 g/dL — ABNORMAL LOW (ref 3.3–5.5)
Alkaline Phosphatase: 52 U/L (ref 26–84)
BUN: 16 mg/dL (ref 7–22)
CALCIUM: 9 mg/dL (ref 8.0–10.3)
CO2: 27 meq/L (ref 18–33)
Chloride: 98 mEq/L (ref 98–108)
Creat: 0.9 mg/dl (ref 0.6–1.2)
GLUCOSE: 86 mg/dL (ref 73–118)
POTASSIUM: 3.2 meq/L — AB (ref 3.3–4.7)
Sodium: 135 mEq/L (ref 128–145)
Total Bilirubin: 0.7 mg/dl (ref 0.20–1.60)
Total Protein: 5.9 g/dL — ABNORMAL LOW (ref 6.4–8.1)

## 2014-10-24 LAB — CBC WITH DIFFERENTIAL (CANCER CENTER ONLY)
BASO#: 0 10*3/uL (ref 0.0–0.2)
BASO%: 0.3 % (ref 0.0–2.0)
EOS ABS: 0 10*3/uL (ref 0.0–0.5)
EOS%: 0.4 % (ref 0.0–7.0)
HCT: 32.1 % — ABNORMAL LOW (ref 38.7–49.9)
HGB: 10.4 g/dL — ABNORMAL LOW (ref 13.0–17.1)
LYMPH#: 2.4 10*3/uL (ref 0.9–3.3)
LYMPH%: 24.2 % (ref 14.0–48.0)
MCH: 29.4 pg (ref 28.0–33.4)
MCHC: 32.4 g/dL (ref 32.0–35.9)
MCV: 91 fL (ref 82–98)
MONO#: 1.2 10*3/uL — AB (ref 0.1–0.9)
MONO%: 12.1 % (ref 0.0–13.0)
NEUT%: 63 % (ref 40.0–80.0)
NEUTROS ABS: 6.2 10*3/uL (ref 1.5–6.5)
Platelets: 291 10*3/uL (ref 145–400)
RBC: 3.54 10*6/uL — AB (ref 4.20–5.70)
RDW: 21.9 % — ABNORMAL HIGH (ref 11.1–15.7)
WBC: 9.9 10*3/uL (ref 4.0–10.0)

## 2014-10-24 LAB — LACTATE DEHYDROGENASE: LDH: 143 U/L (ref 94–250)

## 2014-10-24 MED ORDER — DOCETAXEL CHEMO INJECTION 160 MG/16ML
60.0000 mg/m2 | Freq: Once | INTRAVENOUS | Status: DC
  Filled 2014-10-24: qty 13

## 2014-10-24 MED ORDER — SODIUM CHLORIDE 0.9 % IV SOLN
60.0000 mg/m2 | Freq: Once | INTRAVENOUS | Status: AC
  Administered 2014-10-24: 130 mg via INTRAVENOUS
  Filled 2014-10-24: qty 130

## 2014-10-24 MED ORDER — PALONOSETRON HCL INJECTION 0.25 MG/5ML
INTRAVENOUS | Status: AC
  Filled 2014-10-24: qty 5

## 2014-10-24 MED ORDER — DEXAMETHASONE SODIUM PHOSPHATE 10 MG/ML IJ SOLN
INTRAMUSCULAR | Status: AC
  Filled 2014-10-24: qty 2

## 2014-10-24 MED ORDER — PALONOSETRON HCL INJECTION 0.25 MG/5ML
0.2500 mg | Freq: Once | INTRAVENOUS | Status: AC
  Administered 2014-10-24: 0.25 mg via INTRAVENOUS

## 2014-10-24 MED ORDER — DEXAMETHASONE SODIUM PHOSPHATE 20 MG/5ML IJ SOLN
12.0000 mg | Freq: Once | INTRAMUSCULAR | Status: AC
  Administered 2014-10-24: 12 mg via INTRAVENOUS

## 2014-10-24 MED ORDER — SODIUM CHLORIDE 0.9 % IJ SOLN
10.0000 mL | INTRAMUSCULAR | Status: DC | PRN
  Filled 2014-10-24: qty 10

## 2014-10-24 MED ORDER — SODIUM CHLORIDE 0.9 % IV SOLN
Freq: Once | INTRAVENOUS | Status: AC
  Administered 2014-10-24: 11:00:00 via INTRAVENOUS

## 2014-10-24 MED ORDER — SODIUM CHLORIDE 0.9 % IJ SOLN
10.0000 mL | INTRAMUSCULAR | Status: DC | PRN
  Administered 2014-10-24: 10 mL via INTRAVENOUS
  Filled 2014-10-24: qty 10

## 2014-10-24 MED ORDER — TRAZODONE HCL 50 MG PO TABS: 50.0000 mg | ORAL_TABLET | Freq: Every day | ORAL | Status: AC

## 2014-10-24 MED ORDER — POTASSIUM CHLORIDE 2 MEQ/ML IV SOLN
Freq: Once | INTRAVENOUS | Status: AC
  Administered 2014-10-24: 11:00:00 via INTRAVENOUS
  Filled 2014-10-24: qty 10

## 2014-10-24 MED ORDER — HEPARIN SOD (PORK) LOCK FLUSH 100 UNIT/ML IV SOLN
500.0000 [IU] | Freq: Once | INTRAVENOUS | Status: DC
  Filled 2014-10-24: qty 5

## 2014-10-24 MED ORDER — FOSAPREPITANT DIMEGLUMINE INJECTION 150 MG
150.0000 mg | Freq: Once | INTRAVENOUS | Status: AC
  Administered 2014-10-24: 150 mg via INTRAVENOUS
  Filled 2014-10-24: qty 5

## 2014-10-24 MED ORDER — DEXAMETHASONE SODIUM PHOSPHATE 20 MG/5ML IJ SOLN
INTRAMUSCULAR | Status: AC
  Filled 2014-10-24: qty 5

## 2014-10-24 MED ORDER — HEPARIN SOD (PORK) LOCK FLUSH 100 UNIT/ML IV SOLN
500.0000 [IU] | Freq: Once | INTRAVENOUS | Status: AC | PRN
Start: 2014-10-24 — End: 2014-10-24
  Administered 2014-10-24: 500 [IU]
  Filled 2014-10-24: qty 5

## 2014-10-24 MED ORDER — SODIUM CHLORIDE 0.9 % IV SOLN
130.0000 mg | Freq: Once | INTRAVENOUS | Status: AC
  Administered 2014-10-24: 130 mg via INTRAVENOUS
  Filled 2014-10-24: qty 13

## 2014-10-24 NOTE — Progress Notes (Signed)
[place]  [phone] [phone]  ID: BRANDIN STETZER OB: 08/14/1951 MR#: [id number] ZJI#:[id number] Patient Care Team: Chesley Noon, MD as PCP - General (Family Medicine)  DIAGNOSIS: Metastatic adenocarcinoma of the distal esophagus  INTERVAL HISTORY: Mr. Pursley is here today for follow-up and treatment. He is doing much better. His memory is still a little fuzzy at times but it is improving. He is able to swallow and has a good appetite. He is staying hydrated. He denies fever, chills, n/v, cough, rash, headache, dizziness, SOB, chest pain, palpitations, abdominal pain, constipation, diarrhea, blood in urine or stool. No swelling, tenderness, numbness or tingling in his extremities. His weight is stable. He is having trouble sleeping. Overall, he is feeling much better.   CURRENT TREATMENT: Cisplatin/Taxotere  REVIEW OF SYSTEMS: All other 10 point review of systems is negative except for those issues mentioned above.    PAST MEDICAL HISTORY: Past Medical History  Diagnosis Date  . Atrial fibrillation     with RVR while hospitalized in 2014  . Hyperthyroidism     thyrotoxicosis , graves  . H/O [facility] spotted fever   . Gout   . History of antinuclear antibodies   . GERD (gastroesophageal reflux disease)   . Cancer 06/2014    esopageal ca-7/15  . Esophageal cancer 07/26/2014   PAST SURGICAL HISTORY: Past Surgical History  Procedure Laterality Date  . Leg surgery      Fx, rod removed  . Transthoracic echocardiogram  4/292014    EF 55-60%; severe bi-atrial enlargement; RV mod dilated  . Cardioversion N/A 09/21/2013    Procedure: CARDIOVERSION;  Surgeon: Pixie Casino, MD;  Location: [facility] ENDOSCOPY;  Service: Cardiovascular;  Laterality: N/A;  . Tonsillectomy and adenoidectomy     FAMILY HISTORY Family History  Problem Relation Age of Onset  . Pulmonary embolism Neg Hx   . CAD Neg Hx   . Colon cancer Neg Hx   . Stomach  cancer Neg Hx   . Esophageal cancer Neg Hx   . Diabetes Mother    SOCIAL HISTORY:  History   Social History  . Marital Status: Married    Spouse Name: Almyra Free    Number of Children: 3  . Years of Education: N/A   Occupational History  . sales    Social History Main Topics  . Smoking status: Former Smoker -- 2.00 packs/day for 34 years    Types: Cigarettes    Start date: 03/19/1971    Quit date: 12/30/2004  . Smokeless tobacco: Never Used     Comment: quit smoking 9 years ago  . Alcohol Use: 1.5 oz/week    3 drink(s) per week     Comment: vodka daily  . Drug Use: No  . Sexual Activity: Not on file   Other Topics Concern  . Not on file   Social History Narrative  . No narrative on file   ADVANCED DIRECTIVES: <no information>  HEALTH MAINTENANCE: History  Substance Use Topics  . Smoking status: Former Smoker -- 2.00 packs/day for 34 years    Types: Cigarettes    Start date: 03/19/1971    Quit date: 12/30/2004  . Smokeless tobacco: Never Used     Comment: quit smoking 9 years ago  . Alcohol Use: 1.5 oz/week    3 drink(s) per week     Comment: vodka daily   Colonoscopy: PAP: Bone density: Lipid panel:  Allergies  Allergen Reactions  . Penicillins Other (See Comments)  Hallucinations, from childhood   Current Outpatient Prescriptions  Medication Sig Dispense Refill  . B Complex-C (SUPER B COMPLEX PO) Take 1 tablet by mouth daily.      . capecitabine (XELODA) 500 MG tablet Take 1,500 mg by mouth 2 (two) times daily after a meal.      . Cholecalciferol (VITAMIN D) 2000 UNITS tablet Take 2,000 Units by mouth daily.      Marland Kitchen dexamethasone (DECADRON) 4 MG tablet Take 8 mg by mouth 2 (two) times daily with a meal. Start the day after chemo for three days      . esomeprazole (NEXIUM) 40 MG capsule Take 40 mg by mouth 2 (two) times daily before a meal.      . levofloxacin (LEVAQUIN) 500 MG tablet Take 1 tablet (500 mg total) by mouth daily.  15 tablet  4  .  lidocaine-prilocaine (EMLA) cream Apply 1 application topically as needed.  30 g  10  . LORazepam (ATIVAN) 0.5 MG tablet Take 0.5 mg by mouth every 6 (six) hours as needed (nausea/vomitting).      . Multiple Vitamins-Minerals (MULTIVITAMIN GUMMIES ADULT PO) Take by mouth every morning.      . ondansetron (ZOFRAN) 8 MG tablet Take 1 tablet (8 mg total) by mouth 2 (two) times daily as needed (Nausea or vomiting). Begin 4 days after chemotherapy.  30 tablet  1  . potassium chloride SA (K-DUR,KLOR-CON) 20 MEQ tablet Take 20 mEq by mouth 2 (two) times daily.      . prochlorperazine (COMPAZINE) 10 MG tablet Take 10 mg by mouth as needed for nausea or vomiting.       . propranolol ER (INDERAL LA) 80 MG 24 hr capsule Take 80 mg by mouth at bedtime.      Marland Kitchen venlafaxine (EFFEXOR) 37.5 MG tablet Take 37.5 mg by mouth 2 (two) times daily.      . diphenoxylate-atropine (LOMOTIL) 2.5-0.025 MG per tablet Take 1 tablet by mouth 4 (four) times daily as needed for diarrhea or loose stools.      . traZODone (DESYREL) 50 MG tablet Take 1 tablet (50 mg total) by mouth at bedtime.  30 tablet  3   Current Facility-Administered Medications  Medication Dose Route Frequency Provider Last Rate Last Dose  . heparin lock flush 100 unit/mL  500 Units Intravenous Once [name], MD      . sodium chloride 0.9 % injection 10 mL  10 mL Intravenous PRN [name], MD   10 mL at 10/24/14 1002   Facility-Administered Medications Ordered in Other Visits  Medication Dose Route Frequency Provider Last Rate Last Dose  . CISplatin (PLATINOL) 130 mg in sodium chloride 0.9 % 500 mL chemo infusion  60 mg/m2 (Treatment Plan Actual) Intravenous Once [name], MD      . Dexamethasone Sodium Phosphate (DECADRON) injection 12 mg  12 mg Intravenous Once [name], MD      . DOCEtaxel (TAXOTERE) 130 mg in sodium chloride 0.9 % 250 mL chemo infusion  130 mg Intravenous Once [name], MD      . fosaprepitant (EMEND)  150 mg in sodium chloride 0.9 % 145 mL IVPB  150 mg Intravenous Once [name], MD      . heparin lock flush 100 unit/mL  500 Units Intracatheter Once PRN [name], MD      . palonosetron (ALOXI) injection 0.25 mg  0.25 mg Intravenous Once [name], MD      .  sodium chloride 0.9 % injection 10 mL  10 mL Intracatheter PRN [name], MD       OBJECTIVE: Filed Vitals:   10/24/14 0948  BP: 130/60  Pulse: 98  Temp: 97.4 F (36.3 C)  Resp: 18   Body mass index is 27.53 kg/(m^2). ECOG FS:1 - Symptomatic but completely ambulatory Ocular: Sclerae unicteric, pupils equal, round and reactive to light Ear-nose-throat: Oropharynx clear, dentition fair Lymphatic: No cervical or supraclavicular adenopathy Lungs no rales or rhonchi, good excursion bilaterally Heart regular rate and rhythm, no murmur appreciated Abd soft, nontender, positive bowel sounds MSK no focal spinal tenderness, no joint edema Neuro: non-focal, well-oriented, appropriate affect  LAB RESULTS: CMP     Component Value Date/Time   NA 135 10/24/2014 0948   NA 137 10/03/2014 0845   K 3.2* 10/24/2014 0948   K 4.4 10/03/2014 0845   CL 98 10/24/2014 0948   CL 102 10/03/2014 0845   CO2 27 10/24/2014 0948   CO2 26 10/03/2014 0845   GLUCOSE 86 10/24/2014 0948   GLUCOSE 112* 10/03/2014 0845   BUN 16 10/24/2014 0948   BUN 13 10/03/2014 0845   CREATININE 0.9 10/24/2014 0948   CREATININE 0.73 10/03/2014 0845   CALCIUM 9.0 10/24/2014 0948   CALCIUM 8.7 10/03/2014 0845   PROT 5.9* 10/24/2014 0948   PROT 5.7* 10/03/2014 0845   ALBUMIN 3.4* 10/03/2014 0845   AST 14 10/24/2014 0948   AST 16 10/03/2014 0845   ALT 16 10/24/2014 0948   ALT 12 10/03/2014 0845   ALKPHOS 52 10/24/2014 0948   ALKPHOS 69 10/03/2014 0845   BILITOT 0.70 10/24/2014 0948   BILITOT 0.6 10/03/2014 0845   GFRNONAA >90 09/04/2014 0417   GFRAA >90 09/04/2014 0417   No results found for this basename: SPEP,  UPEP,   kappa and lambda light chains    Lab Results  Component Value Date   WBC 9.9 10/24/2014   NEUTROABS 6.2 10/24/2014   HGB 10.4* 10/24/2014   HCT 32.1* 10/24/2014   MCV 91 10/24/2014   PLT 291 10/24/2014   No results found for this basename: LABCA2   No components found with this basename: [id number]   No results found for this basename: INR,  in the last 168 hours  STUDIES: None  ASSESSMENT/PLAN: Mr. Faulcon is a 63 year old gentleman with stage IV adenocarcinoma of the distal esophagus. We had him on chemotherapy with EOX but he had a hard time with this. He is now oncisplatin/Taxotere.  We will proceed with treatment today as planned.  His CBC and CMP today are ok.  I gave him a prescription for Deseryl for sleep.  He has his treatment schedule.  He knows to call here with any questions or concerns and to go to the ED in the event of an emergency. We can certainly see him sooner if need be.  Eliezer Bottom, NP 10/24/2014 12:53 PM

## 2014-10-24 NOTE — Patient Instructions (Signed)
Cisplatin injection What is this medicine? CISPLATIN (SIS pla tin) is a chemotherapy drug. It targets fast dividing cells, like cancer cells, and causes these cells to die. This medicine is used to treat many types of cancer like bladder, ovarian, and testicular cancers. This medicine may be used for other purposes; ask your health care provider or pharmacist if you have questions. COMMON BRAND NAME(S): Platinol, Platinol -AQ What should I tell my health care provider before I take this medicine? They need to know if you have any of these conditions: -blood disorders -hearing problems -kidney disease -recent or ongoing radiation therapy -an unusual or allergic reaction to cisplatin, carboplatin, other chemotherapy, other medicines, foods, dyes, or preservatives -pregnant or trying to get pregnant -breast-feeding How should I use this medicine? This drug is given as an infusion into a vein. It is administered in a hospital or clinic by a specially trained health care professional. Talk to your pediatrician regarding the use of this medicine in children. Special care may be needed. Overdosage: If you think you have taken too much of this medicine contact a poison control center or emergency room at once. NOTE: This medicine is only for you. Do not share this medicine with others. What if I miss a dose? It is important not to miss a dose. Call your doctor or health care professional if you are unable to keep an appointment. What may interact with this medicine? -dofetilide -foscarnet -medicines for seizures -medicines to increase blood counts like filgrastim, pegfilgrastim, sargramostim -probenecid -pyridoxine used with altretamine -rituximab -some antibiotics like amikacin, gentamicin, neomycin, polymyxin B, streptomycin, tobramycin -sulfinpyrazone -vaccines -zalcitabine Talk to your doctor or health care professional before taking any of these  medicines: -acetaminophen -aspirin -ibuprofen -ketoprofen -naproxen This list may not describe all possible interactions. Give your health care provider a list of all the medicines, herbs, non-prescription drugs, or dietary supplements you use. Also tell them if you smoke, drink alcohol, or use illegal drugs. Some items may interact with your medicine. What should I watch for while using this medicine? Your condition will be monitored carefully while you are receiving this medicine. You will need important blood work done while you are taking this medicine. This drug may make you feel generally unwell. This is not uncommon, as chemotherapy can affect healthy cells as well as cancer cells. Report any side effects. Continue your course of treatment even though you feel ill unless your doctor tells you to stop. In some cases, you may be given additional medicines to help with side effects. Follow all directions for their use. Call your doctor or health care professional for advice if you get a fever, chills or sore throat, or other symptoms of a cold or flu. Do not treat yourself. This drug decreases your body's ability to fight infections. Try to avoid being around people who are sick. This medicine may increase your risk to bruise or bleed. Call your doctor or health care professional if you notice any unusual bleeding. Be careful brushing and flossing your teeth or using a toothpick because you may get an infection or bleed more easily. If you have any dental work done, tell your dentist you are receiving this medicine. Avoid taking products that contain aspirin, acetaminophen, ibuprofen, naproxen, or ketoprofen unless instructed by your doctor. These medicines may hide a fever. Do not become pregnant while taking this medicine. Women should inform their doctor if they wish to become pregnant or think they might be pregnant. There is a   potential for serious side effects to an unborn child. Talk to  your health care professional or pharmacist for more information. Do not breast-feed an infant while taking this medicine. Drink fluids as directed while you are taking this medicine. This will help protect your kidneys. Call your doctor or health care professional if you get diarrhea. Do not treat yourself. What side effects may I notice from receiving this medicine? Side effects that you should report to your doctor or health care professional as soon as possible: -allergic reactions like skin rash, itching or hives, swelling of the face, lips, or tongue -signs of infection - fever or chills, cough, sore throat, pain or difficulty passing urine -signs of decreased platelets or bleeding - bruising, pinpoint red spots on the skin, black, tarry stools, nosebleeds -signs of decreased red blood cells - unusually weak or tired, fainting spells, lightheadedness -breathing problems -changes in hearing -gout pain -low blood counts - This drug may decrease the number of white blood cells, red blood cells and platelets. You may be at increased risk for infections and bleeding. -nausea and vomiting -pain, swelling, redness or irritation at the injection site -pain, tingling, numbness in the hands or feet -problems with balance, movement -trouble passing urine or change in the amount of urine Side effects that usually do not require medical attention (report to your doctor or health care professional if they continue or are bothersome): -changes in vision -loss of appetite -metallic taste in the mouth or changes in taste This list may not describe all possible side effects. Call your doctor for medical advice about side effects. You may report side effects to FDA at 1-800-FDA-1088. Where should I keep my medicine? This drug is given in a hospital or clinic and will not be stored at home. NOTE: This sheet is a summary. It may not cover all possible information. If you have questions about this medicine,  talk to your doctor, pharmacist, or health care provider.  2015, [employer] Standard. (2008-03-19 14:40:54) Docetaxel injection What is this medicine? DOCETAXEL (doe se TAX el) is a chemotherapy drug. It targets fast dividing cells, like cancer cells, and causes these cells to die. This medicine is used to treat many types of cancers like breast cancer, certain stomach cancers, head and neck cancer, lung cancer, and prostate cancer. This medicine may be used for other purposes; ask your health care provider or pharmacist if you have questions. COMMON BRAND NAME(S): Docefrez, Taxotere What should I tell my health care provider before I take this medicine? They need to know if you have any of these conditions: -infection (especially a virus infection such as chickenpox, cold sores, or herpes) -liver disease -low blood counts, like low white cell, platelet, or red cell counts -an unusual or allergic reaction to docetaxel, polysorbate 80, other chemotherapy agents, other medicines, foods, dyes, or preservatives -pregnant or trying to get pregnant -breast-feeding How should I use this medicine? This drug is given as an infusion into a vein. It is administered in a hospital or clinic by a specially trained health care professional. Talk to your pediatrician regarding the use of this medicine in children. Special care may be needed. Overdosage: If you think you have taken too much of this medicine contact a poison control center or emergency room at once. NOTE: This medicine is only for you. Do not share this medicine with others. What if I miss a dose? It is important not to miss your dose. Call your doctor or health care   professional if you are unable to keep an appointment. What may interact with this medicine? -cyclosporine -erythromycin -ketoconazole -medicines to increase blood counts like filgrastim, pegfilgrastim, sargramostim -vaccines Talk to your doctor or health care professional  before taking any of these medicines: -acetaminophen -aspirin -ibuprofen -ketoprofen -naproxen This list may not describe all possible interactions. Give your health care provider a list of all the medicines, herbs, non-prescription drugs, or dietary supplements you use. Also tell them if you smoke, drink alcohol, or use illegal drugs. Some items may interact with your medicine. What should I watch for while using this medicine? Your condition will be monitored carefully while you are receiving this medicine. You will need important blood work done while you are taking this medicine. This drug may make you feel generally unwell. This is not uncommon, as chemotherapy can affect healthy cells as well as cancer cells. Report any side effects. Continue your course of treatment even though you feel ill unless your doctor tells you to stop. In some cases, you may be given additional medicines to help with side effects. Follow all directions for their use. Call your doctor or health care professional for advice if you get a fever, chills or sore throat, or other symptoms of a cold or flu. Do not treat yourself. This drug decreases your body's ability to fight infections. Try to avoid being around people who are sick. This medicine may increase your risk to bruise or bleed. Call your doctor or health care professional if you notice any unusual bleeding. Be careful brushing and flossing your teeth or using a toothpick because you may get an infection or bleed more easily. If you have any dental work done, tell your dentist you are receiving this medicine. Avoid taking products that contain aspirin, acetaminophen, ibuprofen, naproxen, or ketoprofen unless instructed by your doctor. These medicines may hide a fever. This medicine contains an alcohol in the product. You may get drowsy or dizzy. Do not drive, use machinery, or do anything that needs mental alertness until you know how this medicine affects you. Do  not stand or sit up quickly, especially if you are an older patient. This reduces the risk of dizzy or fainting spells. Avoid alcoholic drinks Do not become pregnant while taking this medicine. Women should inform their doctor if they wish to become pregnant or think they might be pregnant. There is a potential for serious side effects to an unborn child. Talk to your health care professional or pharmacist for more information. Do not breast-feed an infant while taking this medicine. What side effects may I notice from receiving this medicine? Side effects that you should report to your doctor or health care professional as soon as possible: -allergic reactions like skin rash, itching or hives, swelling of the face, lips, or tongue -low blood counts - This drug may decrease the number of white blood cells, red blood cells and platelets. You may be at increased risk for infections and bleeding. -signs of infection - fever or chills, cough, sore throat, pain or difficulty passing urine -signs of decreased platelets or bleeding - bruising, pinpoint red spots on the skin, black, tarry stools, nosebleeds -signs of decreased red blood cells - unusually weak or tired, fainting spells, lightheadedness -breathing problems -fast or irregular heartbeat -low blood pressure -mouth sores -nausea and vomiting -pain, swelling, redness or irritation at the injection site -pain, tingling, numbness in the hands or feet -swelling of the ankle, feet, hands -weight gain Side effects that usually  do not require medical attention (report to your prescriber or health care professional if they continue or are bothersome): -bone pain -complete hair loss including hair on your head, underarms, pubic hair, eyebrows, and eyelashes -diarrhea -excessive tearing -changes in the color of fingernails -loosening of the fingernails -nausea -muscle pain -red flush to skin -sweating -weak or tired This list may not describe  all possible side effects. Call your doctor for medical advice about side effects. You may report side effects to FDA at 1-800-FDA-1088. Where should I keep my medicine? This drug is given in a hospital or clinic and will not be stored at home. NOTE: This sheet is a summary. It may not cover all possible information. If you have questions about this medicine, talk to your doctor, pharmacist, or health care provider.  2015, [employer] Standard. (2013-11-08 22:21:02)

## 2014-10-25 ENCOUNTER — Ambulatory Visit: Payer: Managed Care, Other (non HMO)

## 2014-10-25 ENCOUNTER — Ambulatory Visit: Payer: Managed Care, Other (non HMO) | Admitting: Hematology & Oncology

## 2014-10-25 ENCOUNTER — Ambulatory Visit: Payer: Self-pay

## 2014-10-25 ENCOUNTER — Other Ambulatory Visit: Payer: Managed Care, Other (non HMO) | Admitting: Lab

## 2014-10-25 ENCOUNTER — Ambulatory Visit (HOSPITAL_BASED_OUTPATIENT_CLINIC_OR_DEPARTMENT_OTHER): Payer: 59

## 2014-10-25 VITALS — BP 141/74 | HR 72 | Temp 97.5°F | Resp 16

## 2014-10-25 DIAGNOSIS — C159 Malignant neoplasm of esophagus, unspecified: Secondary | ICD-10-CM

## 2014-10-25 DIAGNOSIS — Z5189 Encounter for other specified aftercare: Secondary | ICD-10-CM

## 2014-10-25 DIAGNOSIS — C155 Malignant neoplasm of lower third of esophagus: Secondary | ICD-10-CM

## 2014-10-25 MED ORDER — PEGFILGRASTIM INJECTION 6 MG/0.6ML
6.0000 mg | Freq: Once | SUBCUTANEOUS | Status: AC
  Administered 2014-10-25: 6 mg via SUBCUTANEOUS

## 2014-10-25 MED ORDER — PEGFILGRASTIM INJECTION 6 MG/0.6ML
SUBCUTANEOUS | Status: AC
  Filled 2014-10-25: qty 0.6

## 2014-10-25 NOTE — Patient Instructions (Signed)
Pegfilgrastim injection What is this medicine? PEGFILGRASTIM (peg fil GRA stim) is a long-acting granulocyte colony-stimulating factor that stimulates the growth of neutrophils, a type of white blood cell important in the body's fight against infection. It is used to reduce the incidence of fever and infection in patients with certain types of cancer who are receiving chemotherapy that affects the bone marrow. This medicine may be used for other purposes; ask your health care provider or pharmacist if you have questions. COMMON BRAND NAME(S): Neulasta What should I tell my health care provider before I take this medicine? They need to know if you have any of these conditions: -latex allergy -ongoing radiation therapy -sickle cell disease -skin reactions to acrylic adhesives (On-Body Injector only) -an unusual or allergic reaction to pegfilgrastim, filgrastim, other medicines, foods, dyes, or preservatives -pregnant or trying to get pregnant -breast-feeding How should I use this medicine? This medicine is for injection under the skin. If you get this medicine at home, you will be taught how to prepare and give the pre-filled syringe or how to use the On-body Injector. Refer to the patient Instructions for Use for detailed instructions. Use exactly as directed. Take your medicine at regular intervals. Do not take your medicine more often than directed. It is important that you put your used needles and syringes in a special sharps container. Do not put them in a trash can. If you do not have a sharps container, call your pharmacist or healthcare provider to get one. Talk to your pediatrician regarding the use of this medicine in children. Special care may be needed. Overdosage: If you think you have taken too much of this medicine contact a poison control center or emergency room at once. NOTE: This medicine is only for you. Do not share this medicine with others. What if I miss a dose? It is  important not to miss your dose. Call your doctor or health care professional if you miss your dose. If you miss a dose due to an On-body Injector failure or leakage, a new dose should be administered as soon as possible using a single prefilled syringe for manual use. What may interact with this medicine? Interactions have not been studied. Give your health care provider a list of all the medicines, herbs, non-prescription drugs, or dietary supplements you use. Also tell them if you smoke, drink alcohol, or use illegal drugs. Some items may interact with your medicine. This list may not describe all possible interactions. Give your health care provider a list of all the medicines, herbs, non-prescription drugs, or dietary supplements you use. Also tell them if you smoke, drink alcohol, or use illegal drugs. Some items may interact with your medicine. What should I watch for while using this medicine? You may need blood work done while you are taking this medicine. If you are going to need a MRI, CT scan, or other procedure, tell your doctor that you are using this medicine (On-Body Injector only). What side effects may I notice from receiving this medicine? Side effects that you should report to your doctor or health care professional as soon as possible: -allergic reactions like skin rash, itching or hives, swelling of the face, lips, or tongue -dizziness -fever -pain, redness, or irritation at site where injected -pinpoint red spots on the skin -shortness of breath or breathing problems -stomach or side pain, or pain at the shoulder -swelling -tiredness -trouble passing urine Side effects that usually do not require medical attention (report to your doctor   or health care professional if they continue or are bothersome): -bone pain -muscle pain This list may not describe all possible side effects. Call your doctor for medical advice about side effects. You may report side effects to FDA at  1-800-FDA-1088. Where should I keep my medicine? Keep out of the reach of children. Store pre-filled syringes in a refrigerator between 2 and 8 degrees C (36 and 46 degrees F). Do not freeze. Keep in carton to protect from light. Throw away this medicine if it is left out of the refrigerator for more than 48 hours. Throw away any unused medicine after the expiration date. NOTE: This sheet is a summary. It may not cover all possible information. If you have questions about this medicine, talk to your doctor, pharmacist, or health care provider.  2015, [employer] Standard. (2014-03-14 16:14:05)  

## 2014-10-31 ENCOUNTER — Telehealth: Payer: Self-pay | Admitting: *Deleted

## 2014-10-31 ENCOUNTER — Other Ambulatory Visit (HOSPITAL_BASED_OUTPATIENT_CLINIC_OR_DEPARTMENT_OTHER): Payer: 59 | Admitting: Lab

## 2014-10-31 ENCOUNTER — Ambulatory Visit (HOSPITAL_BASED_OUTPATIENT_CLINIC_OR_DEPARTMENT_OTHER): Payer: 59

## 2014-10-31 ENCOUNTER — Other Ambulatory Visit: Payer: Self-pay | Admitting: Family

## 2014-10-31 VITALS — BP 122/80 | HR 73 | Temp 97.9°F | Resp 20

## 2014-10-31 DIAGNOSIS — C159 Malignant neoplasm of esophagus, unspecified: Secondary | ICD-10-CM

## 2014-10-31 DIAGNOSIS — C155 Malignant neoplasm of lower third of esophagus: Secondary | ICD-10-CM

## 2014-10-31 LAB — CMP (CANCER CENTER ONLY)
ALT(SGPT): 17 U/L (ref 10–47)
AST: 21 U/L (ref 11–38)
Albumin: 3.6 g/dL (ref 3.3–5.5)
Alkaline Phosphatase: 79 U/L (ref 26–84)
BILIRUBIN TOTAL: 0.9 mg/dL (ref 0.20–1.60)
BUN, Bld: 17 mg/dL (ref 7–22)
CO2: 26 meq/L (ref 18–33)
CREATININE: 1 mg/dL (ref 0.6–1.2)
Calcium: 9.1 mg/dL (ref 8.0–10.3)
Chloride: 90 mEq/L — ABNORMAL LOW (ref 98–108)
Glucose, Bld: 96 mg/dL (ref 73–118)
Potassium: 4.1 mEq/L (ref 3.3–4.7)
Sodium: 134 mEq/L (ref 128–145)
Total Protein: 6.4 g/dL (ref 6.4–8.1)

## 2014-10-31 LAB — CBC WITH DIFFERENTIAL (CANCER CENTER ONLY)
BASO#: 0 10*3/uL (ref 0.0–0.2)
BASO#: 0 10*3/uL (ref 0.0–0.2)
BASO%: 0.2 % (ref 0.0–2.0)
BASO%: 0.1 % (ref 0.0–2.0)
EOS ABS: 0 10*3/uL (ref 0.0–0.5)
EOS%: 0 % (ref 0.0–7.0)
EOS%: 0 % (ref 0.0–7.0)
Eosinophils Absolute: 0 10*3/uL (ref 0.0–0.5)
HCT: 34.2 % — ABNORMAL LOW (ref 38.7–49.9)
HCT: 30.1 % — ABNORMAL LOW (ref 38.7–49.9)
HEMOGLOBIN: 10 g/dL — AB (ref 13.0–17.1)
HGB: 11.4 g/dL — ABNORMAL LOW (ref 13.0–17.1)
LYMPH#: 1.1 10*3/uL (ref 0.9–3.3)
LYMPH#: 1 10*3/uL (ref 0.9–3.3)
LYMPH%: 9.8 % — ABNORMAL LOW (ref 14.0–48.0)
LYMPH%: 9.7 % — AB (ref 14.0–48.0)
MCH: 29.9 pg (ref 28.0–33.4)
MCH: 29.9 pg (ref 28.0–33.4)
MCHC: 33.3 g/dL (ref 32.0–35.9)
MCHC: 33.2 g/dL (ref 32.0–35.9)
MCV: 90 fL (ref 82–98)
MCV: 90 fL (ref 82–98)
MONO#: 1.3 10*3/uL — ABNORMAL HIGH (ref 0.1–0.9)
MONO#: 0.8 10*3/uL (ref 0.1–0.9)
MONO%: 11.6 % (ref 0.0–13.0)
MONO%: 8.4 % (ref 0.0–13.0)
NEUT#: 8.1 10*3/uL — ABNORMAL HIGH (ref 1.5–6.5)
NEUT%: 78.4 % (ref 40.0–80.0)
NEUT%: 81.8 % — ABNORMAL HIGH (ref 40.0–80.0)
NEUTROS ABS: 8.5 10*3/uL — AB (ref 1.5–6.5)
PLATELETS: 236 10*3/uL (ref 145–400)
Platelets: 215 10*3/uL (ref 145–400)
RBC: 3.81 10*6/uL — ABNORMAL LOW (ref 4.20–5.70)
RBC: 3.34 10*6/uL — AB (ref 4.20–5.70)
RDW: 20.5 % — AB (ref 11.1–15.7)
RDW: 20.1 % — ABNORMAL HIGH (ref 11.1–15.7)
WBC: 10.8 10*3/uL — ABNORMAL HIGH (ref 4.0–10.0)
WBC: 9.9 10*3/uL (ref 4.0–10.0)

## 2014-10-31 LAB — TSH CHCC: TSH: 1.668 m(IU)/L (ref 0.320–4.118)

## 2014-10-31 LAB — T4: T4 TOTAL: 8.5 ug/dL (ref 4.5–12.0)

## 2014-10-31 LAB — CLOSTRIDIUM DIFFICILE BY PCR: CDIFFPCR: NEGATIVE

## 2014-10-31 MED ORDER — HEPARIN SOD (PORK) LOCK FLUSH 100 UNIT/ML IV SOLN
500.0000 [IU] | Freq: Once | INTRAVENOUS | Status: AC
  Administered 2014-10-31: 500 [IU] via INTRAVENOUS
  Filled 2014-10-31: qty 5

## 2014-10-31 MED ORDER — SODIUM CHLORIDE 0.9 % IJ SOLN
10.0000 mL | Freq: Once | INTRAMUSCULAR | Status: AC
  Administered 2014-10-31: 10 mL via INTRAVENOUS
  Filled 2014-10-31: qty 10

## 2014-10-31 MED ORDER — SODIUM CHLORIDE 0.9 % IV SOLN
Freq: Once | INTRAVENOUS | Status: AC
  Administered 2014-10-31: 11:00:00 via INTRAVENOUS

## 2014-10-31 MED ORDER — METRONIDAZOLE 500 MG PO TABS: 500.0000 mg | ORAL_TABLET | Freq: Three times a day (TID) | ORAL | Status: AC

## 2014-10-31 NOTE — Patient Instructions (Signed)
Dehydration, Adult Dehydration is when you lose more fluids from the body than you take in. Vital organs like the kidneys, brain, and heart cannot function without a proper amount of fluids and salt. Any loss of fluids from the body can cause dehydration.  CAUSES   Vomiting.  Diarrhea.  Excessive sweating.  Excessive urine output.  Fever. SYMPTOMS  Mild dehydration  Thirst.  Dry lips.  Slightly dry mouth. Moderate dehydration  Very dry mouth.  Sunken eyes.  Skin does not bounce back quickly when lightly pinched and released.  Dark urine and decreased urine production.  Decreased tear production.  Headache. Severe dehydration  Very dry mouth.  Extreme thirst.  Rapid, weak pulse (more than 100 beats per minute at rest).  Cold hands and feet.  Not able to sweat in spite of heat and temperature.  Rapid breathing.  Blue lips.  Confusion and lethargy.  Difficulty being awakened.  Minimal urine production.  No tears. DIAGNOSIS  Your caregiver will diagnose dehydration based on your symptoms and your exam. Blood and urine tests will help confirm the diagnosis. The diagnostic evaluation should also identify the cause of dehydration. TREATMENT  Treatment of mild or moderate dehydration can often be done at home by increasing the amount of fluids that you drink. It is best to drink small amounts of fluid more often. Drinking too much at one time can make vomiting worse. Refer to the home care instructions below. Severe dehydration needs to be treated at the hospital where you will probably be given intravenous (IV) fluids that contain water and electrolytes. HOME CARE INSTRUCTIONS   Ask your caregiver about specific rehydration instructions.  Drink enough fluids to keep your urine clear or pale yellow.  Drink small amounts frequently if you have nausea and vomiting.  Eat as you normally do.  Avoid:  Foods or drinks high in sugar.  Carbonated  drinks.  Juice.  Extremely hot or cold fluids.  Drinks with caffeine.  Fatty, greasy foods.  Alcohol.  Tobacco.  Overeating.  Gelatin desserts.  Wash your hands well to avoid spreading bacteria and viruses.  Only take over-the-counter or prescription medicines for pain, discomfort, or fever as directed by your caregiver.  Ask your caregiver if you should continue all prescribed and over-the-counter medicines.  Keep all follow-up appointments with your caregiver. SEEK MEDICAL CARE IF:  You have abdominal pain and it increases or stays in one area (localizes).  You have a rash, stiff neck, or severe headache.  You are irritable, sleepy, or difficult to awaken.  You are weak, dizzy, or extremely thirsty. SEEK IMMEDIATE MEDICAL CARE IF:   You are unable to keep fluids down or you get worse despite treatment.  You have frequent episodes of vomiting or diarrhea.  You have blood or green matter (bile) in your vomit.  You have blood in your stool or your stool looks black and tarry.  You have not urinated in 6 to 8 hours, or you have only urinated a small amount of very dark urine.  You have a fever.  You faint. MAKE SURE YOU:   Understand these instructions.  Will watch your condition.  Will get help right away if you are not doing well or get worse. Document Released: 12/13/2005 Document Revised: 03/06/2012 Document Reviewed: 08/02/2011 ExitCare Patient Information 2015 [employer]. This information is not intended to replace advice given to you by your health care provider. Make sure you discuss any questions you have with your health care   provider.  

## 2014-10-31 NOTE — Telephone Encounter (Signed)
Patient's wife called stating that Willie Owens has had diarrhea for 2 days despite using lomotil. The events are so explosive that he sometimes doesn't make it to the bathroom. He ate/drank minimally yesterday. Of note, he started taking Diflucan for thrush three days ago. Spoke to Dr Marin Olp and we will bring patient in for hydration and further assessment this am.

## 2014-11-01 ENCOUNTER — Telehealth: Payer: Self-pay | Admitting: *Deleted

## 2014-11-01 DIAGNOSIS — C159 Malignant neoplasm of esophagus, unspecified: Secondary | ICD-10-CM

## 2014-11-01 MED ORDER — DIPHENOXYLATE-ATROPINE 2.5-0.025 MG PO TABS: 1.0000 | ORAL_TABLET | Freq: Four times a day (QID) | ORAL | Status: DC | PRN

## 2014-11-01 NOTE — Telephone Encounter (Addendum)
Spoke with Almyra Free. She states that patient is feeling better. She had already seen in E-Chart the results for c-diff and thyroid. She will call the on-call MD this weekend if symptoms worsen. Refill called in for lomotil.   ----- Message from [name], MD sent at 10/31/2014  6:46 PM EST ----- Please call and let him know that he is C. Difficile negative. Thyroid studies look okay. I would keep on the Flagyl. I would have them take Lomotil for his diarrhea. Please call him in the morning to see how he is doing. Thanks. Laurey Arrow

## 2014-11-07 ENCOUNTER — Encounter (HOSPITAL_COMMUNITY)
Admission: RE | Admit: 2014-11-07 | Discharge: 2014-11-07 | Disposition: A | Discharge status: Home / Self Care | Payer: 59 | Source: Ambulatory Visit | Attending: Family | Admitting: Family

## 2014-11-07 DIAGNOSIS — C159 Malignant neoplasm of esophagus, unspecified: Secondary | ICD-10-CM | POA: Insufficient documentation

## 2014-11-07 LAB — GLUCOSE, CAPILLARY: Glucose-Capillary: 90 mg/dL (ref 70–99)

## 2014-11-07 MED ORDER — FLUDEOXYGLUCOSE F - 18 (FDG) INJECTION
10.1000 | Freq: Once | INTRAVENOUS | Status: AC | PRN
  Administered 2014-11-07: 10.1 via INTRAVENOUS

## 2014-11-08 ENCOUNTER — Telehealth: Payer: Self-pay | Admitting: *Deleted

## 2014-11-08 NOTE — Telephone Encounter (Addendum)
Spoke with daughter. Very happy with results.   ----- Message from [name], MD sent at 11/07/2014  5:44 PM EST ----- Please call and let him know that the cancer still is responding. They do not see any obvious mass in the esophagus now and there is decrease in lymph nodes in the abdomen. This is fantastic. Laurey Arrow

## 2014-11-14 ENCOUNTER — Encounter: Payer: Self-pay | Admitting: Hematology & Oncology

## 2014-11-14 ENCOUNTER — Other Ambulatory Visit: Payer: Self-pay | Admitting: *Deleted

## 2014-11-14 ENCOUNTER — Ambulatory Visit (HOSPITAL_BASED_OUTPATIENT_CLINIC_OR_DEPARTMENT_OTHER): Payer: 59 | Admitting: Hematology & Oncology

## 2014-11-14 ENCOUNTER — Ambulatory Visit (HOSPITAL_BASED_OUTPATIENT_CLINIC_OR_DEPARTMENT_OTHER): Payer: 59

## 2014-11-14 ENCOUNTER — Other Ambulatory Visit (HOSPITAL_BASED_OUTPATIENT_CLINIC_OR_DEPARTMENT_OTHER): Payer: 59 | Admitting: Lab

## 2014-11-14 VITALS — BP 124/65 | HR 66 | Temp 97.3°F | Resp 18 | Ht 68.0 in | Wt 196.0 lb

## 2014-11-14 DIAGNOSIS — C159 Malignant neoplasm of esophagus, unspecified: Secondary | ICD-10-CM

## 2014-11-14 DIAGNOSIS — R413 Other amnesia: Secondary | ICD-10-CM

## 2014-11-14 DIAGNOSIS — C155 Malignant neoplasm of lower third of esophagus: Secondary | ICD-10-CM

## 2014-11-14 DIAGNOSIS — Z5111 Encounter for antineoplastic chemotherapy: Secondary | ICD-10-CM

## 2014-11-14 DIAGNOSIS — F05 Delirium due to known physiological condition: Secondary | ICD-10-CM

## 2014-11-14 LAB — CMP (CANCER CENTER ONLY)
ALT: 17 U/L (ref 10–47)
AST: 19 U/L (ref 11–38)
Albumin: 3 g/dL — ABNORMAL LOW (ref 3.3–5.5)
Alkaline Phosphatase: 37 U/L (ref 26–84)
BILIRUBIN TOTAL: 0.8 mg/dL (ref 0.20–1.60)
BUN, Bld: 18 mg/dL (ref 7–22)
CO2: 25 mEq/L (ref 18–33)
CREATININE: 0.9 mg/dL (ref 0.6–1.2)
Calcium: 8.8 mg/dL (ref 8.0–10.3)
Chloride: 93 mEq/L — ABNORMAL LOW (ref 98–108)
Glucose, Bld: 117 mg/dL (ref 73–118)
Potassium: 3.1 mEq/L — ABNORMAL LOW (ref 3.3–4.7)
SODIUM: 136 meq/L (ref 128–145)
Total Protein: 6 g/dL — ABNORMAL LOW (ref 6.4–8.1)

## 2014-11-14 LAB — CBC WITH DIFFERENTIAL (CANCER CENTER ONLY)
BASO#: 0 10*3/uL (ref 0.0–0.2)
BASO%: 0.3 % (ref 0.0–2.0)
EOS%: 0.2 % (ref 0.0–7.0)
Eosinophils Absolute: 0 10*3/uL (ref 0.0–0.5)
HEMATOCRIT: 34.5 % — AB (ref 38.7–49.9)
HGB: 11.4 g/dL — ABNORMAL LOW (ref 13.0–17.1)
LYMPH#: 2.7 10*3/uL (ref 0.9–3.3)
LYMPH%: 22.8 % (ref 14.0–48.0)
MCH: 30.6 pg (ref 28.0–33.4)
MCHC: 33 g/dL (ref 32.0–35.9)
MCV: 93 fL (ref 82–98)
MONO#: 1.1 10*3/uL — AB (ref 0.1–0.9)
MONO%: 8.8 % (ref 0.0–13.0)
NEUT#: 8.1 10*3/uL — ABNORMAL HIGH (ref 1.5–6.5)
NEUT%: 67.9 % (ref 40.0–80.0)
Platelets: 240 10*3/uL (ref 145–400)
RBC: 3.72 10*6/uL — ABNORMAL LOW (ref 4.20–5.70)
RDW: 18.4 % — ABNORMAL HIGH (ref 11.1–15.7)
WBC: 12 10*3/uL — AB (ref 4.0–10.0)

## 2014-11-14 LAB — LACTATE DEHYDROGENASE: LDH: 147 U/L (ref 94–250)

## 2014-11-14 MED ORDER — PALONOSETRON HCL INJECTION 0.25 MG/5ML
0.2500 mg | Freq: Once | INTRAVENOUS | Status: AC
  Administered 2014-11-14: 0.25 mg via INTRAVENOUS

## 2014-11-14 MED ORDER — DEXAMETHASONE SODIUM PHOSPHATE 20 MG/5ML IJ SOLN
12.0000 mg | Freq: Once | INTRAMUSCULAR | Status: AC
  Administered 2014-11-14: 12 mg via INTRAVENOUS

## 2014-11-14 MED ORDER — SODIUM CHLORIDE 0.9 % IJ SOLN
10.0000 mL | INTRAMUSCULAR | Status: DC | PRN
  Administered 2014-11-14: 10 mL
  Filled 2014-11-14: qty 10

## 2014-11-14 MED ORDER — PALONOSETRON HCL INJECTION 0.25 MG/5ML
INTRAVENOUS | Status: AC
  Filled 2014-11-14: qty 5

## 2014-11-14 MED ORDER — AMPHETAMINE-DEXTROAMPHETAMINE 10 MG PO TABS: 10.0000 mg | ORAL_TABLET | Freq: Two times a day (BID) | ORAL | Status: DC

## 2014-11-14 MED ORDER — SODIUM CHLORIDE 0.9 % IV SOLN
60.0000 mg/m2 | Freq: Once | INTRAVENOUS | Status: AC
  Administered 2014-11-14: 130 mg via INTRAVENOUS
  Filled 2014-11-14: qty 130

## 2014-11-14 MED ORDER — DOCETAXEL CHEMO INJECTION 160 MG/16ML
60.0000 mg/m2 | Freq: Once | INTRAVENOUS | Status: AC
  Administered 2014-11-14: 130 mg via INTRAVENOUS
  Filled 2014-11-14: qty 13

## 2014-11-14 MED ORDER — HEPARIN SOD (PORK) LOCK FLUSH 100 UNIT/ML IV SOLN
500.0000 [IU] | Freq: Once | INTRAVENOUS | Status: AC | PRN
Start: 2014-11-14 — End: 2014-11-14
  Administered 2014-11-14: 500 [IU]
  Filled 2014-11-14: qty 5

## 2014-11-14 MED ORDER — SODIUM CHLORIDE 0.9 % IV SOLN
Freq: Once | INTRAVENOUS | Status: AC
  Administered 2014-11-14: 10:00:00 via INTRAVENOUS

## 2014-11-14 MED ORDER — DEXAMETHASONE SODIUM PHOSPHATE 20 MG/5ML IJ SOLN
INTRAMUSCULAR | Status: AC
  Filled 2014-11-14: qty 5

## 2014-11-14 MED ORDER — SODIUM CHLORIDE 0.9 % IV SOLN
150.0000 mg | Freq: Once | INTRAVENOUS | Status: AC
  Administered 2014-11-14: 150 mg via INTRAVENOUS
  Filled 2014-11-14: qty 5

## 2014-11-14 MED ORDER — MANNITOL 25 % IV SOLN
Freq: Once | INTRAVENOUS | Status: AC
  Administered 2014-11-14: 10:00:00 via INTRAVENOUS
  Filled 2014-11-14: qty 10

## 2014-11-14 NOTE — Patient Instructions (Signed)
Cisplatin injection What is this medicine? CISPLATIN (SIS pla tin) is a chemotherapy drug. It targets fast dividing cells, like cancer cells, and causes these cells to die. This medicine is used to treat many types of cancer like bladder, ovarian, and testicular cancers. This medicine may be used for other purposes; ask your health care provider or pharmacist if you have questions. COMMON BRAND NAME(S): Platinol, Platinol -AQ What should I tell my health care provider before I take this medicine? They need to know if you have any of these conditions: -blood disorders -hearing problems -kidney disease -recent or ongoing radiation therapy -an unusual or allergic reaction to cisplatin, carboplatin, other chemotherapy, other medicines, foods, dyes, or preservatives -pregnant or trying to get pregnant -breast-feeding How should I use this medicine? This drug is given as an infusion into a vein. It is administered in a hospital or clinic by a specially trained health care professional. Talk to your pediatrician regarding the use of this medicine in children. Special care may be needed. Overdosage: If you think you have taken too much of this medicine contact a poison control center or emergency room at once. NOTE: This medicine is only for you. Do not share this medicine with others. What if I miss a dose? It is important not to miss a dose. Call your doctor or health care professional if you are unable to keep an appointment. What may interact with this medicine? -dofetilide -foscarnet -medicines for seizures -medicines to increase blood counts like filgrastim, pegfilgrastim, sargramostim -probenecid -pyridoxine used with altretamine -rituximab -some antibiotics like amikacin, gentamicin, neomycin, polymyxin B, streptomycin, tobramycin -sulfinpyrazone -vaccines -zalcitabine Talk to your doctor or health care professional before taking any of these  medicines: -acetaminophen -aspirin -ibuprofen -ketoprofen -naproxen This list may not describe all possible interactions. Give your health care provider a list of all the medicines, herbs, non-prescription drugs, or dietary supplements you use. Also tell them if you smoke, drink alcohol, or use illegal drugs. Some items may interact with your medicine. What should I watch for while using this medicine? Your condition will be monitored carefully while you are receiving this medicine. You will need important blood work done while you are taking this medicine. This drug may make you feel generally unwell. This is not uncommon, as chemotherapy can affect healthy cells as well as cancer cells. Report any side effects. Continue your course of treatment even though you feel ill unless your doctor tells you to stop. In some cases, you may be given additional medicines to help with side effects. Follow all directions for their use. Call your doctor or health care professional for advice if you get a fever, chills or sore throat, or other symptoms of a cold or flu. Do not treat yourself. This drug decreases your body's ability to fight infections. Try to avoid being around people who are sick. This medicine may increase your risk to bruise or bleed. Call your doctor or health care professional if you notice any unusual bleeding. Be careful brushing and flossing your teeth or using a toothpick because you may get an infection or bleed more easily. If you have any dental work done, tell your dentist you are receiving this medicine. Avoid taking products that contain aspirin, acetaminophen, ibuprofen, naproxen, or ketoprofen unless instructed by your doctor. These medicines may hide a fever. Do not become pregnant while taking this medicine. Women should inform their doctor if they wish to become pregnant or think they might be pregnant. There is a   potential for serious side effects to an unborn child. Talk to  your health care professional or pharmacist for more information. Do not breast-feed an infant while taking this medicine. Drink fluids as directed while you are taking this medicine. This will help protect your kidneys. Call your doctor or health care professional if you get diarrhea. Do not treat yourself. What side effects may I notice from receiving this medicine? Side effects that you should report to your doctor or health care professional as soon as possible: -allergic reactions like skin rash, itching or hives, swelling of the face, lips, or tongue -signs of infection - fever or chills, cough, sore throat, pain or difficulty passing urine -signs of decreased platelets or bleeding - bruising, pinpoint red spots on the skin, black, tarry stools, nosebleeds -signs of decreased red blood cells - unusually weak or tired, fainting spells, lightheadedness -breathing problems -changes in hearing -gout pain -low blood counts - This drug may decrease the number of white blood cells, red blood cells and platelets. You may be at increased risk for infections and bleeding. -nausea and vomiting -pain, swelling, redness or irritation at the injection site -pain, tingling, numbness in the hands or feet -problems with balance, movement -trouble passing urine or change in the amount of urine Side effects that usually do not require medical attention (report to your doctor or health care professional if they continue or are bothersome): -changes in vision -loss of appetite -metallic taste in the mouth or changes in taste This list may not describe all possible side effects. Call your doctor for medical advice about side effects. You may report side effects to FDA at 1-800-FDA-1088. Where should I keep my medicine? This drug is given in a hospital or clinic and will not be stored at home. NOTE: This sheet is a summary. It may not cover all possible information. If you have questions about this medicine,  talk to your doctor, pharmacist, or health care provider.  2015, [employer] Standard. (2008-03-19 14:40:54) Docetaxel injection What is this medicine? DOCETAXEL (doe se TAX el) is a chemotherapy drug. It targets fast dividing cells, like cancer cells, and causes these cells to die. This medicine is used to treat many types of cancers like breast cancer, certain stomach cancers, head and neck cancer, lung cancer, and prostate cancer. This medicine may be used for other purposes; ask your health care provider or pharmacist if you have questions. COMMON BRAND NAME(S): Docefrez, Taxotere What should I tell my health care provider before I take this medicine? They need to know if you have any of these conditions: -infection (especially a virus infection such as chickenpox, cold sores, or herpes) -liver disease -low blood counts, like low white cell, platelet, or red cell counts -an unusual or allergic reaction to docetaxel, polysorbate 80, other chemotherapy agents, other medicines, foods, dyes, or preservatives -pregnant or trying to get pregnant -breast-feeding How should I use this medicine? This drug is given as an infusion into a vein. It is administered in a hospital or clinic by a specially trained health care professional. Talk to your pediatrician regarding the use of this medicine in children. Special care may be needed. Overdosage: If you think you have taken too much of this medicine contact a poison control center or emergency room at once. NOTE: This medicine is only for you. Do not share this medicine with others. What if I miss a dose? It is important not to miss your dose. Call your doctor or health care   professional if you are unable to keep an appointment. What may interact with this medicine? -cyclosporine -erythromycin -ketoconazole -medicines to increase blood counts like filgrastim, pegfilgrastim, sargramostim -vaccines Talk to your doctor or health care professional  before taking any of these medicines: -acetaminophen -aspirin -ibuprofen -ketoprofen -naproxen This list may not describe all possible interactions. Give your health care provider a list of all the medicines, herbs, non-prescription drugs, or dietary supplements you use. Also tell them if you smoke, drink alcohol, or use illegal drugs. Some items may interact with your medicine. What should I watch for while using this medicine? Your condition will be monitored carefully while you are receiving this medicine. You will need important blood work done while you are taking this medicine. This drug may make you feel generally unwell. This is not uncommon, as chemotherapy can affect healthy cells as well as cancer cells. Report any side effects. Continue your course of treatment even though you feel ill unless your doctor tells you to stop. In some cases, you may be given additional medicines to help with side effects. Follow all directions for their use. Call your doctor or health care professional for advice if you get a fever, chills or sore throat, or other symptoms of a cold or flu. Do not treat yourself. This drug decreases your body's ability to fight infections. Try to avoid being around people who are sick. This medicine may increase your risk to bruise or bleed. Call your doctor or health care professional if you notice any unusual bleeding. Be careful brushing and flossing your teeth or using a toothpick because you may get an infection or bleed more easily. If you have any dental work done, tell your dentist you are receiving this medicine. Avoid taking products that contain aspirin, acetaminophen, ibuprofen, naproxen, or ketoprofen unless instructed by your doctor. These medicines may hide a fever. This medicine contains an alcohol in the product. You may get drowsy or dizzy. Do not drive, use machinery, or do anything that needs mental alertness until you know how this medicine affects you. Do  not stand or sit up quickly, especially if you are an older patient. This reduces the risk of dizzy or fainting spells. Avoid alcoholic drinks Do not become pregnant while taking this medicine. Women should inform their doctor if they wish to become pregnant or think they might be pregnant. There is a potential for serious side effects to an unborn child. Talk to your health care professional or pharmacist for more information. Do not breast-feed an infant while taking this medicine. What side effects may I notice from receiving this medicine? Side effects that you should report to your doctor or health care professional as soon as possible: -allergic reactions like skin rash, itching or hives, swelling of the face, lips, or tongue -low blood counts - This drug may decrease the number of white blood cells, red blood cells and platelets. You may be at increased risk for infections and bleeding. -signs of infection - fever or chills, cough, sore throat, pain or difficulty passing urine -signs of decreased platelets or bleeding - bruising, pinpoint red spots on the skin, black, tarry stools, nosebleeds -signs of decreased red blood cells - unusually weak or tired, fainting spells, lightheadedness -breathing problems -fast or irregular heartbeat -low blood pressure -mouth sores -nausea and vomiting -pain, swelling, redness or irritation at the injection site -pain, tingling, numbness in the hands or feet -swelling of the ankle, feet, hands -weight gain Side effects that usually  do not require medical attention (report to your prescriber or health care professional if they continue or are bothersome): -bone pain -complete hair loss including hair on your head, underarms, pubic hair, eyebrows, and eyelashes -diarrhea -excessive tearing -changes in the color of fingernails -loosening of the fingernails -nausea -muscle pain -red flush to skin -sweating -weak or tired This list may not describe  all possible side effects. Call your doctor for medical advice about side effects. You may report side effects to FDA at 1-800-FDA-1088. Where should I keep my medicine? This drug is given in a hospital or clinic and will not be stored at home. NOTE: This sheet is a summary. It may not cover all possible information. If you have questions about this medicine, talk to your doctor, pharmacist, or health care provider.  2015, [employer] Standard. (2013-11-08 22:21:02)

## 2014-11-14 NOTE — Progress Notes (Signed)
1100 Encouraged patient to drink lots of water, urine dark, concentrated. Verbalized understanding.

## 2014-11-14 NOTE — Progress Notes (Signed)
Hematology and Oncology Follow Up Visit  Willie Owens [id number] 07/14/1951 63 y.o. 11/14/2014   Principle Diagnosis:  Metastatic adenocarcinoma of the distal esophagus  Current Therapy:    Status post 2 cycles of cis-platinum/Taxotere     Interim History:  Mr.  Owens is back for follow-up. Unfortunately, his memory is going on him again. He's had a very extensive workup for this. There is no evidence of brain metastasis.  I worry about the possibility of a paraneoplastic neurological issue. This would be unusual for esophageal cancer but certainly not unheard of. If this is the case, this would not much that can help. Even treating the tumor does not help that much.  I will send off a panel of antibodies testing for paraneoplastic limbic encephalitis.  We did go ahead and repeat a PET scan on him. This continued to show improvement in his disease. There is no obvious esophageal disease. He has some decreased upper abdominal adenopathy.  He is swallowing well. He's eating okay. He's had no nausea or vomiting. He's had no problems with bowels or bladder. He's had no rashes. He's had no bleeding. He's had no leg swelling.  He is not able to work anymore because of the memory issues.  I wonder try him on some Adderall. I'll have him try 10 mg twice a day.  Overall, his performance status is ECOG 1.  Medications: Current outpatient prescriptions: B Complex-C (SUPER B COMPLEX PO), Take 1 tablet by mouth daily., Disp: , Rfl: ;  Cholecalciferol (VITAMIN D) 2000 UNITS tablet, Take 2,000 Units by mouth daily., Disp: , Rfl: ;  dexamethasone (DECADRON) 4 MG tablet, Take 8 mg by mouth 2 (two) times daily with a meal. Start the day after chemo for three days, Disp: , Rfl:  diphenoxylate-atropine (LOMOTIL) 2.5-0.025 MG per tablet, Take 1 tablet by mouth 4 (four) times daily as needed for diarrhea or loose stools., Disp: 30 tablet, Rfl: 2;  esomeprazole (NEXIUM) 40 MG capsule, Take 40 mg by  mouth 2 (two) times daily before a meal., Disp: , Rfl: ;  lidocaine-prilocaine (EMLA) cream, Apply 1 application topically as needed., Disp: 30 g, Rfl: 10 LORazepam (ATIVAN) 0.5 MG tablet, Take 0.5 mg by mouth every 6 (six) hours as needed (nausea/vomitting)., Disp: , Rfl: ;  Multiple Vitamins-Minerals (MULTIVITAMIN GUMMIES ADULT PO), Take by mouth every morning., Disp: , Rfl: ;  ondansetron (ZOFRAN) 8 MG tablet, Take 1 tablet (8 mg total) by mouth 2 (two) times daily as needed (Nausea or vomiting). Begin 4 days after chemotherapy., Disp: 30 tablet, Rfl: 1 potassium chloride SA (K-DUR,KLOR-CON) 20 MEQ tablet, Take 20 mEq by mouth 2 (two) times daily., Disp: , Rfl: ;  Probiotic Product (ALIGN) 4 MG CAPS, Take by mouth every morning., Disp: , Rfl: ;  prochlorperazine (COMPAZINE) 10 MG tablet, Take 10 mg by mouth as needed for nausea or vomiting. , Disp: , Rfl: ;  propranolol ER (INDERAL LA) 80 MG 24 hr capsule, Take 80 mg by mouth at bedtime., Disp: , Rfl:  traZODone (DESYREL) 50 MG tablet, Take 1 tablet (50 mg total) by mouth at bedtime., Disp: 30 tablet, Rfl: 3;  amphetamine-dextroamphetamine (ADDERALL) 10 MG tablet, Take 1 tablet (10 mg total) by mouth 2 (two) times daily with a meal., Disp: 60 tablet, Rfl: 0;  capecitabine (XELODA) 500 MG tablet, Take 1,500 mg by mouth 2 (two) times daily after a meal., Disp: , Rfl:  venlafaxine (EFFEXOR) 37.5 MG tablet, Take 37.5 mg by mouth 2 (  two) times daily., Disp: , Rfl:  No current facility-administered medications for this visit. Facility-Administered Medications Ordered in Other Visits: sodium chloride 0.9 % injection 10 mL, 10 mL, Intracatheter, PRN, [name], MD, 10 mL at 11/14/14 1635  Allergies:  Allergies  Allergen Reactions  . Penicillins Other (See Comments)    Hallucinations, from childhood    Past Medical History, Surgical history, Social history, and Family History were reviewed and updated.  Review of Systems: As above  Physical  Exam:  height is 5\' 8"  (1.727 m) and weight is 196 lb (88.905 kg). His oral temperature is 97.3 F (36.3 C). His blood pressure is 124/65 and his pulse is 66. His respiration is 18.   Well-developed and well-nourished white gentleman. Head and neck exam shows alopecia. He has no ocular or oral lesions. There is no adenopathy in the neck. Lungs are clear. Cardiac exam regular rate and rhythm with no murmurs, rubs or bruits. Abdomen is soft. Has good bowel sounds. There is no fluid wave. There is no palpable liver or spleen tip. Back exam shows no tenderness over the spine, ribs or hips. Extremities shows no clubbing, cyanosis or edema. Skin exam shows no rashes, ecchymoses or petechia. Neurological exam is nonfocal. Again, he has no or very little short-term memory.  Lab Results  Component Value Date   WBC 12.0* 11/14/2014   HGB 11.4* 11/14/2014   HCT 34.5* 11/14/2014   MCV 93 11/14/2014   PLT 240 11/14/2014     Chemistry      Component Value Date/Time   NA 136 11/14/2014 0833   NA 137 10/03/2014 0845   K 3.1* 11/14/2014 0833   K 4.4 10/03/2014 0845   CL 93* 11/14/2014 0833   CL 102 10/03/2014 0845   CO2 25 11/14/2014 0833   CO2 26 10/03/2014 0845   BUN 18 11/14/2014 0833   BUN 13 10/03/2014 0845   CREATININE 0.9 11/14/2014 0833   CREATININE 0.73 10/03/2014 0845      Component Value Date/Time   CALCIUM 8.8 11/14/2014 0833   CALCIUM 8.7 10/03/2014 0845   ALKPHOS 37 11/14/2014 0833   ALKPHOS 69 10/03/2014 0845   AST 19 11/14/2014 0833   AST 16 10/03/2014 0845   ALT 17 11/14/2014 0833   ALT 12 10/03/2014 0845   BILITOT 0.80 11/14/2014 0833   BILITOT 0.6 10/03/2014 0845         Impression and Plan: Willie Owens is 63 year old gentleman with metastatic adenocarcinoma the distal esophagus.  He is responding to treatment. He is actually tolerating treatment well.  We will try to see about another upper endoscopy and getting biopsies.  We will see what the antibody panel  is going to show [place]. Again, if it is positive for want of the neurological antibodies, there's one not much that has been shown to be that effective.  Overall, his quality of life has been doing pretty good outside the memory issues.  We will go ahead and give him 2 more cycles of treatment and then repeat a PET scan.  I will call his gastroenterologist to see about an upper endoscopy.  I spent about 30 minutes with he and his wife this morning.   [name], MD 11/19/20156:21 PM

## 2014-11-15 ENCOUNTER — Ambulatory Visit (HOSPITAL_BASED_OUTPATIENT_CLINIC_OR_DEPARTMENT_OTHER): Payer: 59

## 2014-11-15 DIAGNOSIS — C155 Malignant neoplasm of lower third of esophagus: Secondary | ICD-10-CM

## 2014-11-15 DIAGNOSIS — Z5189 Encounter for other specified aftercare: Secondary | ICD-10-CM

## 2014-11-15 DIAGNOSIS — C159 Malignant neoplasm of esophagus, unspecified: Secondary | ICD-10-CM

## 2014-11-15 MED ORDER — PEGFILGRASTIM INJECTION 6 MG/0.6ML ~~LOC~~
6.0000 mg | PREFILLED_SYRINGE | Freq: Once | SUBCUTANEOUS | Status: AC
  Administered 2014-11-15: 6 mg via SUBCUTANEOUS

## 2014-11-15 MED ORDER — PEGFILGRASTIM INJECTION 6 MG/0.6ML ~~LOC~~
PREFILLED_SYRINGE | SUBCUTANEOUS | Status: AC
  Filled 2014-11-15: qty 0.6

## 2014-11-15 NOTE — Patient Instructions (Signed)
Pegfilgrastim injection What is this medicine? PEGFILGRASTIM (peg fil GRA stim) is a long-acting granulocyte colony-stimulating factor that stimulates the growth of neutrophils, a type of white blood cell important in the body's fight against infection. It is used to reduce the incidence of fever and infection in patients with certain types of cancer who are receiving chemotherapy that affects the bone marrow. This medicine may be used for other purposes; ask your health care provider or pharmacist if you have questions. COMMON BRAND NAME(S): Neulasta What should I tell my health care provider before I take this medicine? They need to know if you have any of these conditions: -latex allergy -ongoing radiation therapy -sickle cell disease -skin reactions to acrylic adhesives (On-Body Injector only) -an unusual or allergic reaction to pegfilgrastim, filgrastim, other medicines, foods, dyes, or preservatives -pregnant or trying to get pregnant -breast-feeding How should I use this medicine? This medicine is for injection under the skin. If you get this medicine at home, you will be taught how to prepare and give the pre-filled syringe or how to use the On-body Injector. Refer to the patient Instructions for Use for detailed instructions. Use exactly as directed. Take your medicine at regular intervals. Do not take your medicine more often than directed. It is important that you put your used needles and syringes in a special sharps container. Do not put them in a trash can. If you do not have a sharps container, call your pharmacist or healthcare provider to get one. Talk to your pediatrician regarding the use of this medicine in children. Special care may be needed. Overdosage: If you think you have taken too much of this medicine contact a poison control center or emergency room at once. NOTE: This medicine is only for you. Do not share this medicine with others. What if I miss a dose? It is  important not to miss your dose. Call your doctor or health care professional if you miss your dose. If you miss a dose due to an On-body Injector failure or leakage, a new dose should be administered as soon as possible using a single prefilled syringe for manual use. What may interact with this medicine? Interactions have not been studied. Give your health care provider a list of all the medicines, herbs, non-prescription drugs, or dietary supplements you use. Also tell them if you smoke, drink alcohol, or use illegal drugs. Some items may interact with your medicine. This list may not describe all possible interactions. Give your health care provider a list of all the medicines, herbs, non-prescription drugs, or dietary supplements you use. Also tell them if you smoke, drink alcohol, or use illegal drugs. Some items may interact with your medicine. What should I watch for while using this medicine? You may need blood work done while you are taking this medicine. If you are going to need a MRI, CT scan, or other procedure, tell your doctor that you are using this medicine (On-Body Injector only). What side effects may I notice from receiving this medicine? Side effects that you should report to your doctor or health care professional as soon as possible: -allergic reactions like skin rash, itching or hives, swelling of the face, lips, or tongue -dizziness -fever -pain, redness, or irritation at site where injected -pinpoint red spots on the skin -shortness of breath or breathing problems -stomach or side pain, or pain at the shoulder -swelling -tiredness -trouble passing urine Side effects that usually do not require medical attention (report to your doctor   or health care professional if they continue or are bothersome): -bone pain -muscle pain This list may not describe all possible side effects. Call your doctor for medical advice about side effects. You may report side effects to FDA at  1-800-FDA-1088. Where should I keep my medicine? Keep out of the reach of children. Store pre-filled syringes in a refrigerator between 2 and 8 degrees C (36 and 46 degrees F). Do not freeze. Keep in carton to protect from light. Throw away this medicine if it is left out of the refrigerator for more than 48 hours. Throw away any unused medicine after the expiration date. NOTE: This sheet is a summary. It may not cover all possible information. If you have questions about this medicine, talk to your doctor, pharmacist, or health care provider.  2015, [employer] Standard. (2014-03-14 16:14:05)  

## 2014-11-18 ENCOUNTER — Telehealth: Payer: Self-pay

## 2014-11-18 ENCOUNTER — Telehealth: Payer: Self-pay | Admitting: Hematology & Oncology

## 2014-11-18 NOTE — Telephone Encounter (Signed)
[employer] has APPROVED the AMPHETAMINE/DEXTROAMPHETAMINE  and is good until 11/16/2015.  FIle ID: [id number]        COPY SCANNED

## 2014-11-18 NOTE — Telephone Encounter (Signed)
-----   Message from Jerene Bears, MD sent at 11/18/2014  8:29 AM EST ----- Orma Flaming wants Mr. Putt to have repeat EGD to assess esophageal cancer response to treatment Pt has had memory issues after chemo, so will need to arrange with his wife Thank  Ulice Dash  ----- Message -----    From: Darden Dates    Sent: 11/15/2014   9:15 AM      To: Jerene Bears, MD  Dr. Marin Olp would like to speak to you about this pt.  You can reach him when you have a moment at [phone]

## 2014-11-19 ENCOUNTER — Telehealth: Payer: Self-pay | Admitting: *Deleted

## 2014-11-19 NOTE — Telephone Encounter (Signed)
Left message to call back on home phone and wife's cell phone.

## 2014-11-19 NOTE — Telephone Encounter (Signed)
Wife called stating patient's appetite is poor. He is drinking and taking his Ensure supplements but not eating solid food. Continued to encourage solid food intake, but as long as he is taking in liquids with supplements, he's not in risk of developing dehydration. She will call back by Friday if patient doesn't start eating solid food, or if he starts to decrease his fluids.  Also confirmed with wife that she was aware that the adderall was approved. She will pick up today.

## 2014-11-20 NOTE — Telephone Encounter (Signed)
Left message for pt to call back  °

## 2014-11-21 LAB — ANNA IFA TITER REFLEX PANEL: ANNA IFA Titer Screen: NOT DETECTED

## 2014-11-22 ENCOUNTER — Telehealth: Payer: Self-pay | Admitting: *Deleted

## 2014-11-22 ENCOUNTER — Ambulatory Visit (HOSPITAL_BASED_OUTPATIENT_CLINIC_OR_DEPARTMENT_OTHER): Payer: 59

## 2014-11-22 ENCOUNTER — Other Ambulatory Visit: Payer: Self-pay | Admitting: *Deleted

## 2014-11-22 ENCOUNTER — Other Ambulatory Visit (HOSPITAL_BASED_OUTPATIENT_CLINIC_OR_DEPARTMENT_OTHER): Payer: 59 | Admitting: Lab

## 2014-11-22 ENCOUNTER — Other Ambulatory Visit: Payer: Self-pay

## 2014-11-22 VITALS — BP 121/54 | HR 67 | Temp 98.0°F | Resp 18

## 2014-11-22 DIAGNOSIS — C159 Malignant neoplasm of esophagus, unspecified: Secondary | ICD-10-CM

## 2014-11-22 DIAGNOSIS — R197 Diarrhea, unspecified: Secondary | ICD-10-CM

## 2014-11-22 LAB — CBC WITH DIFFERENTIAL (CANCER CENTER ONLY)
BASO#: 0 10*3/uL (ref 0.0–0.2)
BASO%: 0.1 % (ref 0.0–2.0)
EOS%: 0.1 % (ref 0.0–7.0)
Eosinophils Absolute: 0 10*3/uL (ref 0.0–0.5)
HEMATOCRIT: 36.4 % — AB (ref 38.7–49.9)
HEMOGLOBIN: 12.3 g/dL — AB (ref 13.0–17.1)
LYMPH#: 0.7 10*3/uL — AB (ref 0.9–3.3)
LYMPH%: 4.5 % — ABNORMAL LOW (ref 14.0–48.0)
MCH: 30.8 pg (ref 28.0–33.4)
MCHC: 33.8 g/dL (ref 32.0–35.9)
MCV: 91 fL (ref 82–98)
MONO#: 0.5 10*3/uL (ref 0.1–0.9)
MONO%: 3.3 % (ref 0.0–13.0)
NEUT#: 14.3 10*3/uL — ABNORMAL HIGH (ref 1.5–6.5)
NEUT%: 92 % — AB (ref 40.0–80.0)
Platelets: 187 10*3/uL (ref 145–400)
RBC: 4 10*6/uL — ABNORMAL LOW (ref 4.20–5.70)
RDW: 16.5 % — AB (ref 11.1–15.7)
WBC: 15.5 10*3/uL — AB (ref 4.0–10.0)

## 2014-11-22 LAB — CMP (CANCER CENTER ONLY)
ALBUMIN: 2.5 g/dL — AB (ref 3.3–5.5)
ALT: 21 U/L (ref 10–47)
AST: 25 U/L (ref 11–38)
Alkaline Phosphatase: 56 U/L (ref 26–84)
BUN, Bld: 14 mg/dL (ref 7–22)
CALCIUM: 5.6 mg/dL — AB (ref 8.0–10.3)
CHLORIDE: 108 meq/L (ref 98–108)
CO2: 26 mEq/L (ref 18–33)
Creat: 0.8 mg/dl (ref 0.6–1.2)
Glucose, Bld: 86 mg/dL (ref 73–118)
POTASSIUM: 2.5 meq/L — AB (ref 3.3–4.7)
Sodium: 139 mEq/L (ref 128–145)
TOTAL PROTEIN: 4.5 g/dL — AB (ref 6.4–8.1)
Total Bilirubin: 0.7 mg/dl (ref 0.20–1.60)

## 2014-11-22 LAB — MAGNESIUM: Magnesium: 0.9 mg/dL — ABNORMAL LOW (ref 1.5–2.5)

## 2014-11-22 MED ORDER — POTASSIUM CHLORIDE 10 MEQ/100ML IV SOLN: 10.0000 meq | INTRAVENOUS | Status: DC

## 2014-11-22 MED ORDER — POTASSIUM CHLORIDE CRYS ER 20 MEQ PO TBCR: 40.0000 meq | EXTENDED_RELEASE_TABLET | Freq: Two times a day (BID) | ORAL | Status: DC

## 2014-11-22 MED ORDER — MAGNESIUM OXIDE 400 (241.3 MG) MG PO TABS: 800.0000 mg | ORAL_TABLET | Freq: Every day | ORAL | Status: DC

## 2014-11-22 MED ORDER — METRONIDAZOLE 500 MG PO TABS: 500.0000 mg | ORAL_TABLET | Freq: Three times a day (TID) | ORAL | Status: DC

## 2014-11-22 MED ORDER — SODIUM CHLORIDE 0.9 % IV SOLN
1000.0000 mL | Freq: Once | INTRAVENOUS | Status: AC
  Administered 2014-11-22: 13:00:00 via INTRAVENOUS

## 2014-11-22 MED ORDER — SODIUM CHLORIDE 0.9 % IV SOLN
2.0000 g | Freq: Once | INTRAVENOUS | Status: AC
  Administered 2014-11-22: 2 g via INTRAVENOUS
  Filled 2014-11-22: qty 20

## 2014-11-22 MED ORDER — POTASSIUM CHLORIDE CRYS ER 20 MEQ PO TBCR
80.0000 meq | EXTENDED_RELEASE_TABLET | Freq: Once | ORAL | Status: AC
  Administered 2014-11-22: 80 meq via ORAL
  Filled 2014-11-22: qty 4

## 2014-11-22 NOTE — Patient Instructions (Signed)
Dehydration, Adult Dehydration is when you lose more fluids from the body than you take in. Vital organs like the kidneys, brain, and heart cannot function without a proper amount of fluids and salt. Any loss of fluids from the body can cause dehydration.  CAUSES   Vomiting.  Diarrhea.  Excessive sweating.  Excessive urine output.  Fever. SYMPTOMS  Mild dehydration  Thirst.  Dry lips.  Slightly dry mouth. Moderate dehydration  Very dry mouth.  Sunken eyes.  Skin does not bounce back quickly when lightly pinched and released.  Dark urine and decreased urine production.  Decreased tear production.  Headache. Severe dehydration  Very dry mouth.  Extreme thirst.  Rapid, weak pulse (more than 100 beats per minute at rest).  Cold hands and feet.  Not able to sweat in spite of heat and temperature.  Rapid breathing.  Blue lips.  Confusion and lethargy.  Difficulty being awakened.  Minimal urine production.  No tears. DIAGNOSIS  Your caregiver will diagnose dehydration based on your symptoms and your exam. Blood and urine tests will help confirm the diagnosis. The diagnostic evaluation should also identify the cause of dehydration. TREATMENT  Treatment of mild or moderate dehydration can often be done at home by increasing the amount of fluids that you drink. It is best to drink small amounts of fluid more often. Drinking too much at one time can make vomiting worse. Refer to the home care instructions below. Severe dehydration needs to be treated at the hospital where you will probably be given intravenous (IV) fluids that contain water and electrolytes. HOME CARE INSTRUCTIONS   Ask your caregiver about specific rehydration instructions.  Drink enough fluids to keep your urine clear or pale yellow.  Drink small amounts frequently if you have nausea and vomiting.  Eat as you normally do.  Avoid:  Foods or drinks high in sugar.  Carbonated  drinks.  Juice.  Extremely hot or cold fluids.  Drinks with caffeine.  Fatty, greasy foods.  Alcohol.  Tobacco.  Overeating.  Gelatin desserts.  Wash your hands well to avoid spreading bacteria and viruses.  Only take over-the-counter or prescription medicines for pain, discomfort, or fever as directed by your caregiver.  Ask your caregiver if you should continue all prescribed and over-the-counter medicines.  Keep all follow-up appointments with your caregiver. SEEK MEDICAL CARE IF:  You have abdominal pain and it increases or stays in one area (localizes).  You have a rash, stiff neck, or severe headache.  You are irritable, sleepy, or difficult to awaken.  You are weak, dizzy, or extremely thirsty. SEEK IMMEDIATE MEDICAL CARE IF:   You are unable to keep fluids down or you get worse despite treatment.  You have frequent episodes of vomiting or diarrhea.  You have blood or green matter (bile) in your vomit.  You have blood in your stool or your stool looks black and tarry.  You have not urinated in 6 to 8 hours, or you have only urinated a small amount of very dark urine.  You have a fever.  You faint. MAKE SURE YOU:   Understand these instructions.  Will watch your condition.  Will get help right away if you are not doing well or get worse. Document Released: 12/13/2005 Document Revised: 03/06/2012 Document Reviewed: 08/02/2011 ExitCare Patient Information 2015 [employer]. This information is not intended to replace advice given to you by your health care provider. Make sure you discuss any questions you have with your health care   provider.  

## 2014-11-22 NOTE — Telephone Encounter (Signed)
Patient's wife called and he is more weak, not eating or drinking for a few days. Will bring in for fluids this afternoon.

## 2014-11-22 NOTE — Progress Notes (Signed)
He came in because of having diarrhea. This had been going on for about 5-6 days.  He tolerated his last chemotherapy pretty well. He still has this encephalitis-type picture. He has very little memory. We did send off a neuronal antibody panel. I did do not have this back yet.  He's had no fever. He's had no problems urinating. He's had no nausea or vomiting. He's not eating all that much. Pap according to his wife, this seems to happen after each treatment.  We will go ahead and put him on some Flagyl.  His labs showed his potassium is low at 2.5. His calcium was 5.6. We gave him some IV calcium in the office. We will have him on 80 mEq of potassium at home. He will take 40 mEq twice a day.  On his exam, his abdomen is soft. It is not distended. Bowel sounds are active. Lungs are clear. Cardiac exam regular in rhythm. Oral exam shows no mucositis. Skin exam no rashes. Neurological exam is without any obvious focal deficits.  We will plan to get him back to see [place] on Monday-3 days area and we will repeat his labs.  I have contemplated stopping his treatments. He is responding. I just want him to have a better quality of life.  Laurey Arrow

## 2014-11-25 ENCOUNTER — Other Ambulatory Visit (HOSPITAL_BASED_OUTPATIENT_CLINIC_OR_DEPARTMENT_OTHER): Payer: 59 | Admitting: Lab

## 2014-11-25 ENCOUNTER — Ambulatory Visit (HOSPITAL_BASED_OUTPATIENT_CLINIC_OR_DEPARTMENT_OTHER): Payer: 59

## 2014-11-25 ENCOUNTER — Telehealth: Payer: Self-pay | Admitting: *Deleted

## 2014-11-25 VITALS — BP 108/63 | HR 62 | Temp 97.8°F | Resp 18

## 2014-11-25 DIAGNOSIS — R197 Diarrhea, unspecified: Secondary | ICD-10-CM

## 2014-11-25 DIAGNOSIS — R630 Anorexia: Secondary | ICD-10-CM

## 2014-11-25 DIAGNOSIS — C155 Malignant neoplasm of lower third of esophagus: Secondary | ICD-10-CM

## 2014-11-25 DIAGNOSIS — C159 Malignant neoplasm of esophagus, unspecified: Secondary | ICD-10-CM

## 2014-11-25 DIAGNOSIS — R64 Cachexia: Secondary | ICD-10-CM

## 2014-11-25 LAB — CMP (CANCER CENTER ONLY)
ALBUMIN: 3.1 g/dL — AB (ref 3.3–5.5)
ALT(SGPT): 27 U/L (ref 10–47)
AST: 19 U/L (ref 11–38)
Alkaline Phosphatase: 70 U/L (ref 26–84)
BUN: 18 mg/dL (ref 7–22)
CALCIUM: 8.6 mg/dL (ref 8.0–10.3)
CHLORIDE: 92 meq/L — AB (ref 98–108)
CO2: 28 mEq/L (ref 18–33)
Creat: 1 mg/dl (ref 0.6–1.2)
Glucose, Bld: 112 mg/dL (ref 73–118)
POTASSIUM: 4.3 meq/L (ref 3.3–4.7)
Sodium: 134 mEq/L (ref 128–145)
Total Bilirubin: 0.7 mg/dl (ref 0.20–1.60)
Total Protein: 5.8 g/dL — ABNORMAL LOW (ref 6.4–8.1)

## 2014-11-25 LAB — CBC WITH DIFFERENTIAL (CANCER CENTER ONLY)
BASO#: 0.1 10*3/uL (ref 0.0–0.2)
BASO%: 0.3 % (ref 0.0–2.0)
EOS%: 0 % (ref 0.0–7.0)
Eosinophils Absolute: 0 10*3/uL (ref 0.0–0.5)
HCT: 33.9 % — ABNORMAL LOW (ref 38.7–49.9)
HEMOGLOBIN: 11.4 g/dL — AB (ref 13.0–17.1)
LYMPH#: 1.2 10*3/uL (ref 0.9–3.3)
LYMPH%: 6.1 % — AB (ref 14.0–48.0)
MCH: 31.4 pg (ref 28.0–33.4)
MCHC: 33.6 g/dL (ref 32.0–35.9)
MCV: 93 fL (ref 82–98)
MONO#: 0.7 10*3/uL (ref 0.1–0.9)
MONO%: 3.5 % (ref 0.0–13.0)
NEUT#: 17.7 10*3/uL — ABNORMAL HIGH (ref 1.5–6.5)
NEUT%: 90.1 % — AB (ref 40.0–80.0)
Platelets: 186 10*3/uL (ref 145–400)
RBC: 3.63 10*6/uL — ABNORMAL LOW (ref 4.20–5.70)
RDW: 16.8 % — ABNORMAL HIGH (ref 11.1–15.7)
WBC: 19.6 10*3/uL — ABNORMAL HIGH (ref 4.0–10.0)

## 2014-11-25 LAB — TECHNOLOGIST REVIEW CHCC SATELLITE

## 2014-11-25 LAB — MAGNESIUM: Magnesium: 1.2 mg/dL — ABNORMAL LOW (ref 1.5–2.5)

## 2014-11-25 MED ORDER — DRONABINOL 5 MG PO CAPS: 5.0000 mg | ORAL_CAPSULE | Freq: Two times a day (BID) | ORAL | Status: DC

## 2014-11-25 MED ORDER — MAGNESIUM SULFATE 50 % IJ SOLN
2.0000 g | Freq: Once | INTRAMUSCULAR | Status: AC
  Administered 2014-11-25: 2 g via INTRAVENOUS
  Filled 2014-11-25: qty 4

## 2014-11-25 MED ORDER — SODIUM CHLORIDE 0.9 % IV SOLN
Freq: Once | INTRAVENOUS | Status: AC
  Administered 2014-11-25: 14:00:00 via INTRAVENOUS

## 2014-11-25 MED ORDER — IPRATROPIUM-ALBUTEROL 0.5-2.5 (3) MG/3ML IN SOLN
3.0000 mL | RESPIRATORY_TRACT | Status: DC
  Administered 2014-11-25: 3 mL via RESPIRATORY_TRACT
  Filled 2014-11-25: qty 3

## 2014-11-25 MED ORDER — HEPARIN SOD (PORK) LOCK FLUSH 100 UNIT/ML IV SOLN
500.0000 [IU] | Freq: Once | INTRAVENOUS | Status: AC
  Administered 2014-11-25: 500 [IU] via INTRAVENOUS
  Filled 2014-11-25: qty 5

## 2014-11-25 MED ORDER — IPRATROPIUM BROMIDE 0.02 % IN SOLN
RESPIRATORY_TRACT | Status: AC
  Filled 2014-11-25: qty 2.5

## 2014-11-25 MED ORDER — SODIUM CHLORIDE 0.9 % IJ SOLN
10.0000 mL | INTRAMUSCULAR | Status: DC | PRN
  Administered 2014-11-25: 10 mL via INTRAVENOUS
  Filled 2014-11-25: qty 10

## 2014-11-25 NOTE — Telephone Encounter (Signed)
-----   Message from Jerene Bears, MD sent at 11/25/2014 12:53 PM EST ----- Would stay with prevacid if still able to get Thanks  ----- Message -----    From: Larina Bras, CMA    Sent: 11/25/2014  12:05 PM      To: Jerene Bears, MD  Dr Hilarie Fredrickson-  I am a bit confused on what PPI this patient is to take. Back on 07/08/14 at time of office visit, patient was given Nexium twice daily (which has already been prior auth'ed). On 07/17/14, Prevacid solutab was sent in following an endoscopy (I assume because you requested a "liquid ppi"). That medication was approved through insurance as well. Which PPI do you really want patient on???

## 2014-11-25 NOTE — Telephone Encounter (Signed)
Pt scheduled for previsit 11/26/14@11am , EGD scheduled in the [facility] 12/02/14@8am . Pts wife aware of appts.

## 2014-11-25 NOTE — Telephone Encounter (Signed)
noted 

## 2014-11-25 NOTE — Patient Instructions (Signed)
Dehydration, Adult Dehydration is when you lose more fluids from the body than you take in. Vital organs like the kidneys, brain, and heart cannot function without a proper amount of fluids and salt. Any loss of fluids from the body can cause dehydration.  CAUSES   Vomiting.  Diarrhea.  Excessive sweating.  Excessive urine output.  Fever. SYMPTOMS  Mild dehydration  Thirst.  Dry lips.  Slightly dry mouth. Moderate dehydration  Very dry mouth.  Sunken eyes.  Skin does not bounce back quickly when lightly pinched and released.  Dark urine and decreased urine production.  Decreased tear production.  Headache. Severe dehydration  Very dry mouth.  Extreme thirst.  Rapid, weak pulse (more than 100 beats per minute at rest).  Cold hands and feet.  Not able to sweat in spite of heat and temperature.  Rapid breathing.  Blue lips.  Confusion and lethargy.  Difficulty being awakened.  Minimal urine production.  No tears. DIAGNOSIS  Your caregiver will diagnose dehydration based on your symptoms and your exam. Blood and urine tests will help confirm the diagnosis. The diagnostic evaluation should also identify the cause of dehydration. TREATMENT  Treatment of mild or moderate dehydration can often be done at home by increasing the amount of fluids that you drink. It is best to drink small amounts of fluid more often. Drinking too much at one time can make vomiting worse. Refer to the home care instructions below. Severe dehydration needs to be treated at the hospital where you will probably be given intravenous (IV) fluids that contain water and electrolytes. HOME CARE INSTRUCTIONS   Ask your caregiver about specific rehydration instructions.  Drink enough fluids to keep your urine clear or pale yellow.  Drink small amounts frequently if you have nausea and vomiting.  Eat as you normally do.  Avoid:  Foods or drinks high in sugar.  Carbonated  drinks.  Juice.  Extremely hot or cold fluids.  Drinks with caffeine.  Fatty, greasy foods.  Alcohol.  Tobacco.  Overeating.  Gelatin desserts.  Wash your hands well to avoid spreading bacteria and viruses.  Only take over-the-counter or prescription medicines for pain, discomfort, or fever as directed by your caregiver.  Ask your caregiver if you should continue all prescribed and over-the-counter medicines.  Keep all follow-up appointments with your caregiver. SEEK MEDICAL CARE IF:  You have abdominal pain and it increases or stays in one area (localizes).  You have a rash, stiff neck, or severe headache.  You are irritable, sleepy, or difficult to awaken.  You are weak, dizzy, or extremely thirsty. SEEK IMMEDIATE MEDICAL CARE IF:   You are unable to keep fluids down or you get worse despite treatment.  You have frequent episodes of vomiting or diarrhea.  You have blood or green matter (bile) in your vomit.  You have blood in your stool or your stool looks black and tarry.  You have not urinated in 6 to 8 hours, or you have only urinated a small amount of very dark urine.  You have a fever.  You faint. MAKE SURE YOU:   Understand these instructions.  Will watch your condition.  Will get help right away if you are not doing well or get worse. Document Released: 12/13/2005 Document Revised: 03/06/2012 Document Reviewed: 08/02/2011 ExitCare Patient Information 2015 [employer]. This information is not intended to replace advice given to you by your health care provider. Make sure you discuss any questions you have with your health care   provider.  Magnesium Sulfate injection What is this medicine? MAGNESIUM SULFATE (mag NEE zee um SUL fate) is an electrolyte injection commonly used to treat low magnesium levels in your blood and to prevent or control certain seizures. This medicine may be used for other purposes; ask your health care provider or  pharmacist if you have questions. What should I tell my health care provider before I take this medicine? They need to know if you have any of these conditions: -heart disease -history of irregular heart beat -kidney disease -an unusual or allergic reaction to magnesium sulfate, medicines, foods, dyes, or preservatives -pregnant or trying to get pregnant -breast-feeding How should I use this medicine? This medicine is for infusion into a vein. It is given by a health care professional in a hospital or clinic setting. Talk to your pediatrician regarding the use of this medicine in children. While this drug may be prescribed for selected conditions, precautions do apply. Overdosage: If you think you have taken too much of this medicine contact a poison control center or emergency room at once. NOTE: This medicine is only for you. Do not share this medicine with others. What if I miss a dose? This does not apply. What may interact with this medicine? This medicine may interact with the following medications: -certain medicines for anxiety or sleep -certain medicines for seizures like phenobarbital -digoxin -medicines that relax muscles for surgery -narcotic medicines for pain This list may not describe all possible interactions. Give your health care provider a list of all the medicines, herbs, non-prescription drugs, or dietary supplements you use. Also tell them if you smoke, drink alcohol, or use illegal drugs. Some items may interact with your medicine. What should I watch for while using this medicine? Your condition will be monitored carefully while you are receiving this medicine. You may need blood work done while you are receiving this medicine. What side effects may I notice from receiving this medicine? Side effects that you should report to your doctor or health care professional as soon as possible: -allergic reactions like skin rash, itching or hives, swelling of the face,  lips, or tongue -facial flushing -muscle weakness -signs and symptoms of low blood pressure like dizziness; feeling faint or lightheaded, falls; unusually weak or tired -signs and symptoms of a dangerous change in heartbeat or heart rhythm like chest pain; dizziness; fast or irregular heartbeat; palpitations; breathing problems -sweating This list may not describe all possible side effects. Call your doctor for medical advice about side effects. You may report side effects to FDA at 1-800-FDA-1088. Where should I keep my medicine? This drug is given in a hospital or clinic and will not be stored at home. NOTE: This sheet is a summary. It may not cover all possible information. If you have questions about this medicine, talk to your doctor, pharmacist, or health care provider.  2015, Elsevier/Gold Standard. (2013-04-23 10:35:11)

## 2014-11-26 ENCOUNTER — Ambulatory Visit (AMBULATORY_SURGERY_CENTER): Payer: Self-pay

## 2014-11-26 ENCOUNTER — Telehealth: Payer: Self-pay | Admitting: *Deleted

## 2014-11-26 VITALS — Ht 78.0 in | Wt 190.0 lb

## 2014-11-26 DIAGNOSIS — Z8501 Personal history of malignant neoplasm of esophagus: Secondary | ICD-10-CM

## 2014-11-26 NOTE — Telephone Encounter (Addendum)
-----   Message from [name], MD sent at 11/25/2014  7:00 PM EST ----- Call his wife and let her know that the blood tests for his neurological symptoms are normal. Pete - Left VM informing wife that the blood tests for his neurological symptoms are normal.

## 2014-11-26 NOTE — Progress Notes (Signed)
Pt and his wife (Julie)came into the office today for the pre-visit prior to the endoscopy on 12/02/14 with Dr Hilarie Fredrickson.Almyra Free will be with the pt on 12/02/14 to help with paperwork and changing clothes.Pt has had some memory issues past his chemotherapy per wife.    Per wife, no allergies to soy or egg products.Pt not taking any weight loss meds or using  O2 at home.

## 2014-11-28 ENCOUNTER — Inpatient Hospital Stay (HOSPITAL_COMMUNITY): Payer: 59

## 2014-11-28 ENCOUNTER — Encounter (HOSPITAL_COMMUNITY): Payer: Self-pay | Admitting: *Deleted

## 2014-11-28 ENCOUNTER — Other Ambulatory Visit (HOSPITAL_BASED_OUTPATIENT_CLINIC_OR_DEPARTMENT_OTHER): Payer: 59 | Admitting: Lab

## 2014-11-28 ENCOUNTER — Encounter: Payer: Self-pay | Admitting: Hematology & Oncology

## 2014-11-28 ENCOUNTER — Telehealth: Payer: Self-pay | Admitting: *Deleted

## 2014-11-28 ENCOUNTER — Ambulatory Visit (HOSPITAL_BASED_OUTPATIENT_CLINIC_OR_DEPARTMENT_OTHER): Payer: 59 | Admitting: Hematology & Oncology

## 2014-11-28 ENCOUNTER — Inpatient Hospital Stay (HOSPITAL_COMMUNITY)
Admission: AD | Admit: 2014-11-28 | Discharge: 2014-12-02 | DRG: 070 | Disposition: A | Discharge status: Home / Self Care | Payer: 59 | Source: Ambulatory Visit | Attending: Internal Medicine | Admitting: Internal Medicine

## 2014-11-28 VITALS — BP 121/81 | HR 82 | Temp 97.6°F | Resp 18 | Ht 74.0 in | Wt 190.0 lb

## 2014-11-28 DIAGNOSIS — K219 Gastro-esophageal reflux disease without esophagitis: Secondary | ICD-10-CM | POA: Diagnosis present

## 2014-11-28 DIAGNOSIS — C155 Malignant neoplasm of lower third of esophagus: Secondary | ICD-10-CM

## 2014-11-28 DIAGNOSIS — E0590 Thyrotoxicosis, unspecified without thyrotoxic crisis or storm: Secondary | ICD-10-CM | POA: Diagnosis present

## 2014-11-28 DIAGNOSIS — Y929 Unspecified place or not applicable: Secondary | ICD-10-CM

## 2014-11-28 DIAGNOSIS — J410 Simple chronic bronchitis: Secondary | ICD-10-CM

## 2014-11-28 DIAGNOSIS — G4700 Insomnia, unspecified: Secondary | ICD-10-CM | POA: Diagnosis present

## 2014-11-28 DIAGNOSIS — R197 Diarrhea, unspecified: Secondary | ICD-10-CM | POA: Diagnosis present

## 2014-11-28 DIAGNOSIS — M109 Gout, unspecified: Secondary | ICD-10-CM | POA: Diagnosis present

## 2014-11-28 DIAGNOSIS — G454 Transient global amnesia: Secondary | ICD-10-CM | POA: Diagnosis present

## 2014-11-28 DIAGNOSIS — D638 Anemia in other chronic diseases classified elsewhere: Secondary | ICD-10-CM | POA: Diagnosis present

## 2014-11-28 DIAGNOSIS — R634 Abnormal weight loss: Secondary | ICD-10-CM | POA: Diagnosis present

## 2014-11-28 DIAGNOSIS — Z880 Allergy status to penicillin: Secondary | ICD-10-CM | POA: Diagnosis not present

## 2014-11-28 DIAGNOSIS — E876 Hypokalemia: Secondary | ICD-10-CM | POA: Diagnosis present

## 2014-11-28 DIAGNOSIS — T451X5A Adverse effect of antineoplastic and immunosuppressive drugs, initial encounter: Secondary | ICD-10-CM | POA: Diagnosis present

## 2014-11-28 DIAGNOSIS — Z8501 Personal history of malignant neoplasm of esophagus: Secondary | ICD-10-CM

## 2014-11-28 DIAGNOSIS — R040 Epistaxis: Secondary | ICD-10-CM | POA: Insufficient documentation

## 2014-11-28 DIAGNOSIS — R4182 Altered mental status, unspecified: Secondary | ICD-10-CM | POA: Diagnosis present

## 2014-11-28 DIAGNOSIS — F419 Anxiety disorder, unspecified: Secondary | ICD-10-CM | POA: Diagnosis present

## 2014-11-28 DIAGNOSIS — Z681 Body mass index (BMI) 19 or less, adult: Secondary | ICD-10-CM | POA: Diagnosis not present

## 2014-11-28 DIAGNOSIS — I4891 Unspecified atrial fibrillation: Secondary | ICD-10-CM | POA: Diagnosis present

## 2014-11-28 DIAGNOSIS — C159 Malignant neoplasm of esophagus, unspecified: Secondary | ICD-10-CM

## 2014-11-28 DIAGNOSIS — J189 Pneumonia, unspecified organism: Secondary | ICD-10-CM | POA: Diagnosis present

## 2014-11-28 DIAGNOSIS — Z87891 Personal history of nicotine dependence: Secondary | ICD-10-CM | POA: Diagnosis not present

## 2014-11-28 DIAGNOSIS — G9340 Encephalopathy, unspecified: Secondary | ICD-10-CM | POA: Diagnosis present

## 2014-11-28 DIAGNOSIS — N390 Urinary tract infection, site not specified: Secondary | ICD-10-CM | POA: Diagnosis present

## 2014-11-28 DIAGNOSIS — F05 Delirium due to known physiological condition: Secondary | ICD-10-CM

## 2014-11-28 LAB — CBC
HCT: 34.3 % — ABNORMAL LOW (ref 39.0–52.0)
Hemoglobin: 11.6 g/dL — ABNORMAL LOW (ref 13.0–17.0)
MCH: 30.5 pg (ref 26.0–34.0)
MCHC: 33.8 g/dL (ref 30.0–36.0)
MCV: 90.3 fL (ref 78.0–100.0)
PLATELETS: 157 10*3/uL (ref 150–400)
RBC: 3.8 MIL/uL — AB (ref 4.22–5.81)
RDW: 16.6 % — AB (ref 11.5–15.5)
WBC: 18.5 10*3/uL — AB (ref 4.0–10.5)

## 2014-11-28 LAB — CMP (CANCER CENTER ONLY)
ALBUMIN: 3.6 g/dL (ref 3.3–5.5)
ALT: 26 U/L (ref 10–47)
AST: 22 U/L (ref 11–38)
Alkaline Phosphatase: 70 U/L (ref 26–84)
BILIRUBIN TOTAL: 0.9 mg/dL (ref 0.20–1.60)
BUN, Bld: 16 mg/dL (ref 7–22)
CALCIUM: 8.3 mg/dL (ref 8.0–10.3)
CHLORIDE: 94 meq/L — AB (ref 98–108)
CO2: 29 mEq/L (ref 18–33)
Creat: 1.2 mg/dl (ref 0.6–1.2)
GLUCOSE: 105 mg/dL (ref 73–118)
Potassium: 3.2 mEq/L — ABNORMAL LOW (ref 3.3–4.7)
SODIUM: 134 meq/L (ref 128–145)
TOTAL PROTEIN: 6.2 g/dL — AB (ref 6.4–8.1)

## 2014-11-28 LAB — URINE MICROSCOPIC-ADD ON

## 2014-11-28 LAB — CBC WITH DIFFERENTIAL (CANCER CENTER ONLY)
BASO#: 0.1 10*3/uL (ref 0.0–0.2)
BASO%: 0.5 % (ref 0.0–2.0)
EOS ABS: 0 10*3/uL (ref 0.0–0.5)
EOS%: 0 % (ref 0.0–7.0)
HCT: 35.9 % — ABNORMAL LOW (ref 38.7–49.9)
HEMOGLOBIN: 12.3 g/dL — AB (ref 13.0–17.1)
LYMPH#: 1.8 10*3/uL (ref 0.9–3.3)
LYMPH%: 9.9 % — ABNORMAL LOW (ref 14.0–48.0)
MCH: 31.2 pg (ref 28.0–33.4)
MCHC: 34.3 g/dL (ref 32.0–35.9)
MCV: 91 fL (ref 82–98)
MONO#: 0.8 10*3/uL (ref 0.1–0.9)
MONO%: 4.4 % (ref 0.0–13.0)
NEUT#: 15.7 10*3/uL — ABNORMAL HIGH (ref 1.5–6.5)
NEUT%: 85.2 % — AB (ref 40.0–80.0)
Platelets: 167 10*3/uL (ref 145–400)
RBC: 3.94 10*6/uL — AB (ref 4.20–5.70)
RDW: 16.4 % — ABNORMAL HIGH (ref 11.1–15.7)
WBC: 18.4 10*3/uL — ABNORMAL HIGH (ref 4.0–10.0)

## 2014-11-28 LAB — URINALYSIS, ROUTINE W REFLEX MICROSCOPIC
Glucose, UA: NEGATIVE mg/dL
Hgb urine dipstick: NEGATIVE
KETONES UR: NEGATIVE mg/dL
NITRITE: POSITIVE — AB
PROTEIN: NEGATIVE mg/dL
Specific Gravity, Urine: 1.026 (ref 1.005–1.030)
Urobilinogen, UA: 1 mg/dL (ref 0.0–1.0)
pH: 5.5 (ref 5.0–8.0)

## 2014-11-28 LAB — CREATININE, SERUM
CREATININE: 1.13 mg/dL (ref 0.50–1.35)
GFR calc non Af Amer: 68 mL/min — ABNORMAL LOW (ref >90)
GFR, EST AFRICAN AMERICAN: 79 mL/min — AB (ref >90)

## 2014-11-28 LAB — T4, FREE: Free T4: 2.12 ng/dL — ABNORMAL HIGH (ref 0.80–1.80)

## 2014-11-28 LAB — TSH: TSH: 1.75 u[IU]/mL (ref 0.350–4.500)

## 2014-11-28 MED ORDER — ONDANSETRON HCL 4 MG/2ML IJ SOLN: 4.0000 mg | Freq: Four times a day (QID) | INTRAMUSCULAR | Status: DC | PRN

## 2014-11-28 MED ORDER — ENOXAPARIN SODIUM 40 MG/0.4ML ~~LOC~~ SOLN
40.0000 mg | SUBCUTANEOUS | Status: DC
  Administered 2014-11-28: 40 mg via SUBCUTANEOUS
  Filled 2014-11-28 (×2): qty 0.4

## 2014-11-28 MED ORDER — HYDROCODONE-ACETAMINOPHEN 5-325 MG PO TABS: 1.0000 | ORAL_TABLET | Freq: Four times a day (QID) | ORAL | Status: DC | PRN

## 2014-11-28 MED ORDER — ACETAMINOPHEN 650 MG RE SUPP: 650.0000 mg | Freq: Four times a day (QID) | RECTAL | Status: DC | PRN

## 2014-11-28 MED ORDER — MAGNESIUM OXIDE 400 (241.3 MG) MG PO TABS
800.0000 mg | ORAL_TABLET | Freq: Every day | ORAL | Status: DC
  Administered 2014-11-28 – 2014-12-02 (×5): 800 mg via ORAL
  Filled 2014-11-28 (×5): qty 2

## 2014-11-28 MED ORDER — POTASSIUM CHLORIDE 10 MEQ/100ML IV SOLN
10.0000 meq | INTRAVENOUS | Status: AC
  Administered 2014-11-28 (×3): 10 meq via INTRAVENOUS
  Filled 2014-11-28 (×3): qty 100

## 2014-11-28 MED ORDER — ACETAMINOPHEN 325 MG PO TABS
650.0000 mg | ORAL_TABLET | Freq: Four times a day (QID) | ORAL | Status: DC | PRN
  Administered 2014-11-28: 650 mg via ORAL
  Filled 2014-11-28: qty 2

## 2014-11-28 MED ORDER — SODIUM CHLORIDE 0.9 % IV SOLN
INTRAVENOUS | Status: DC
  Administered 2014-11-28 – 2014-12-01 (×4): via INTRAVENOUS

## 2014-11-28 MED ORDER — POTASSIUM CHLORIDE CRYS ER 20 MEQ PO TBCR
40.0000 meq | EXTENDED_RELEASE_TABLET | Freq: Two times a day (BID) | ORAL | Status: DC
  Administered 2014-11-28 – 2014-12-02 (×8): 40 meq via ORAL
  Filled 2014-11-28 (×9): qty 2

## 2014-11-28 MED ORDER — ONDANSETRON HCL 4 MG PO TABS: 4.0000 mg | ORAL_TABLET | Freq: Four times a day (QID) | ORAL | Status: DC | PRN

## 2014-11-28 MED ORDER — SODIUM CHLORIDE 0.9 % IJ SOLN: 3.0000 mL | Freq: Two times a day (BID) | INTRAMUSCULAR | Status: DC

## 2014-11-28 MED ORDER — SODIUM CHLORIDE 0.9 % IJ SOLN: 3.0000 mL | INTRAMUSCULAR | Status: DC | PRN

## 2014-11-28 MED ORDER — PROPRANOLOL HCL ER 80 MG PO CP24
80.0000 mg | ORAL_CAPSULE | Freq: Every day | ORAL | Status: DC
  Administered 2014-11-28 – 2014-12-01 (×4): 80 mg via ORAL
  Filled 2014-11-28 (×5): qty 1

## 2014-11-28 MED ORDER — SODIUM CHLORIDE 0.9 % IV SOLN: 250.0000 mL | INTRAVENOUS | Status: DC | PRN

## 2014-11-28 NOTE — Telephone Encounter (Signed)
Wife Almyra Free called in tears stating that patients mental status is worsening and she is very upset and worried.  Spoke to Dr. Marin Olp about this.  Dr. Marin Olp suggests patient be admitted to [facility] and followed by neurology at this point since he has done scans, labwork and other tests.  Dr. Marin Olp wants to see patient in the office first.  Patient and wife on way here to see Dr. Marin Olp

## 2014-11-28 NOTE — Plan of Care (Signed)
Problem: Consults Goal: General Medical Patient Education See Patient Education Module for specific education. Outcome: Completed/Met Date Met:  11/28/14

## 2014-11-28 NOTE — H&P (Signed)
PCP:   Chesley Noon, MD   Chief Complaint:  Altered mental status  HPI:  63 year old male who  has a past medical history of Atrial fibrillation; Hyperthyroidism; H/O [facility] spotted fever; Gout; History of antinuclear antibodies; GERD (gastroesophageal reflux disease); Cancer (06/2014); Esophageal cancer (07/26/2014); Maintenance chemotherapy; and Portacath in place. Today was brought to the hospital for worsening mental status. Patient has history of metastatic adenocarcinoma of distal esophagus, status post chemotherapy with cis-platinum/Taxotere. Patient has been having memory issues since starting chemotherapy, as per wife he usually gets memory problems where he would be behaving inappropriately and confused which would last few days and then get better of its own. Patient also was admitted in September 2015 for the same at that time he was seen by neurology and was diagnosed with transient global amnesia. Patient also underwent an MRI of the brain as well as EEG which did not reveal any significant abnormality. Patient also has been started on Marinol as well as Adderall almost a week ago but patient's wife feels that patient has been getting worse, he was unable to sleep at night, has been more confused. Patient is unable to provide any significant history but as per wife patient did not have nausea vomiting or diarrhea. There was no dysuria urgency or frequency of urination. He denied any chest pain or shortness of breath.  Allergies:   Allergies  Allergen Reactions  . Penicillins Other (See Comments)    Hallucinations, from childhood      Past Medical History  Diagnosis Date  . Atrial fibrillation     with RVR while hospitalized in 2014  . Hyperthyroidism     thyrotoxicosis , graves  . H/O [facility] spotted fever   . Gout   . History of antinuclear antibodies   . GERD (gastroesophageal reflux disease)   . Cancer 06/2014    esopageal ca-7/15  . Esophageal  cancer 07/26/2014  . Maintenance chemotherapy     every 3 weeks/last chemo  was on 11/14/14  . Portacath in place     right side upper chest    Past Surgical History  Procedure Laterality Date  . Leg surgery      Fx, rod removed/right leg  . Transthoracic echocardiogram  4/292014    EF 55-60%; severe bi-atrial enlargement; RV mod dilated  . Cardioversion N/A 09/21/2013    Procedure: CARDIOVERSION;  Surgeon: Pixie Casino, MD;  Location: [facility] ENDOSCOPY;  Service: Cardiovascular;  Laterality: N/A;  . Tonsillectomy and adenoidectomy    . Porta cath      right side chest    Prior to Admission medications   Medication Sig Start Date End Date Taking? Authorizing Provider  amphetamine-dextroamphetamine (ADDERALL) 10 MG tablet Take 1 tablet (10 mg total) by mouth 2 (two) times daily with a meal. 11/14/14   [name], MD  B Complex-C (SUPER B COMPLEX PO) Take 1 tablet by mouth daily.    Historical Provider, MD  capecitabine (XELODA) 500 MG tablet Take 1,500 mg by mouth 2 (two) times daily after a meal.    Historical Provider, MD  Cholecalciferol (VITAMIN D) 2000 UNITS tablet Take 2,000 Units by mouth daily.    Historical Provider, MD  dexamethasone (DECADRON) 4 MG tablet Take 8 mg by mouth 2 (two) times daily with a meal. Start the day after chemo for three days    Historical Provider, MD  diphenoxylate-atropine (LOMOTIL) 2.5-0.025 MG per tablet Take 1 tablet by mouth 4 (four) times  daily as needed for diarrhea or loose stools. Patient not taking: Reported on 11/26/2014 11/01/14   [name], MD  dronabinol (MARINOL) 5 MG capsule Take 1 capsule (5 mg total) by mouth 2 (two) times daily before a meal. 11/25/14   [name], MD  esomeprazole (NEXIUM) 40 MG capsule Take 40 mg by mouth 2 (two) times daily before a meal.    Historical Provider, MD  lidocaine-prilocaine (EMLA) cream Apply 1 application topically as needed. Patient not taking: Reported on 11/26/2014 07/31/14   [name], MD  LORazepam (ATIVAN) 0.5 MG tablet Take 0.5 mg by mouth every 6 (six) hours as needed (nausea/vomitting).    Historical Provider, MD  magnesium oxide (MAG-OX) 400 (241.3 MG) MG tablet Take 2 tablets (800 mg total) by mouth daily. Patient not taking: Reported on 11/26/2014 11/22/14   [name], MD  metroNIDAZOLE (FLAGYL) 500 MG tablet Take 1 tablet (500 mg total) by mouth 3 (three) times daily. Patient not taking: Reported on 11/26/2014 11/22/14   [name], MD  Multiple Vitamins-Minerals (MULTIVITAMIN GUMMIES ADULT PO) Take by mouth every morning.    Historical Provider, MD  ondansetron (ZOFRAN) 8 MG tablet Take 1 tablet (8 mg total) by mouth 2 (two) times daily as needed (Nausea or vomiting). Begin 4 days after chemotherapy. 10/03/14   [name], MD  potassium chloride SA (K-DUR,KLOR-CON) 20 MEQ tablet Take 2 tablets (40 mEq total) by mouth 2 (two) times daily. Patient not taking: Reported on 11/26/2014 11/22/14   [name], MD  Probiotic Product (ALIGN) 4 MG CAPS Take by mouth every morning.    Historical Provider, MD  prochlorperazine (COMPAZINE) 10 MG tablet Take 10 mg by mouth as needed for nausea or vomiting.     Historical Provider, MD  propranolol ER (INDERAL LA) 80 MG 24 hr capsule Take 80 mg by mouth at bedtime.    Historical Provider, MD  traZODone (DESYREL) 50 MG tablet Take 1 tablet (50 mg total) by mouth at bedtime. 10/24/14   Eliezer Bottom, NP  venlafaxine (EFFEXOR) 37.5 MG tablet Take 37.5 mg by mouth 2 (two) times daily.    Historical Provider, MD    Social History:  reports that he quit smoking about 9 years ago. His smoking use included Cigarettes. He started smoking about 43 years ago. He has a 68 pack-year smoking history. He has never used smokeless tobacco. He reports that he does not drink alcohol or use illicit drugs.  Family History  Problem Relation Age of Onset  . Pulmonary embolism Neg Hx   . CAD Neg Hx   . Colon cancer Neg Hx     . Stomach cancer Neg Hx   . Esophageal cancer Neg Hx   . Diabetes Mother      All the positives are listed in BOLD  Review of Systems:  HEENT: Headache, blurred vision, runny nose, sore throat Neck: Hypothyroidism, hyperthyroidism,,lymphadenopathy Chest : Shortness of breath, history of COPD, Asthma Heart : Chest pain, history of coronary arterey disease GI:  Nausea, vomiting, diarrhea, constipation, GERD GU: Dysuria, urgency, frequency of urination, hematuria Neuro: Stroke, seizures, syncope, memory problems Psych: Depression, anxiety, hallucinations   Physical Exam: Blood pressure 147/84, pulse 91, temperature 97.8 F (36.6 C), temperature source Oral, resp. rate 18, height 6\' 6"  (1.981 m), weight 84.823 kg (187 lb), SpO2 99 %. Constitutional:   Patient is a well-developed and well-nourished * male in no acute distress and cooperative with  exam. Head: Normocephalic and atraumatic Mouth: Mucus membranes moist Eyes: PERRL, EOMI, conjunctivae normal Neck: Supple, No Thyromegaly Cardiovascular: RRR, S1 normal, S2 normal Pulmonary/Chest: CTAB, no wheezes, rales, or rhonchi Abdominal: Soft. Non-tender, non-distended, bowel sounds are normal, no masses, organomegaly, or guarding present.  Neurological:  alert and oriented 2, could not tell the month or the year, Strength is normal and symmetric bilaterally, cranial nerve II-XII are grossly intact, no focal motor deficit, sensory intact to light touch bilaterally.  Extremities : No Cyanosis, Clubbing or Edema  Labs on Admission:  Basic Metabolic Panel:  Recent Labs Lab 11/22/14 1300 11/22/14 1451 11/25/14 1207 11/28/14 1239  NA 139  --  134 134  K 2.5*  --  4.3 3.2*  CL 108  --  92* 94*  CO2 26  --  28 29  GLUCOSE 86  --  112 105  BUN 14  --  18 16  CREATININE 0.8  --  1.0 1.2  CALCIUM 5.6*  --  8.6 8.3  MG  --  0.9* 1.2*  --    Liver Function Tests:  Recent Labs Lab 11/22/14 1300 11/25/14 1207 11/28/14 1239   AST 25 19 22   ALT 21 27 26   ALKPHOS 56 70 70  BILITOT 0.70 0.70 0.90  PROT 4.5* 5.8* 6.2*   No results for input(s): LIPASE, AMYLASE in the last 168 hours. No results for input(s): AMMONIA in the last 168 hours. CBC:  Recent Labs Lab 11/22/14 1300 11/25/14 1207 11/28/14 1239  WBC 15.5* 19.6* 18.4*  NEUTROABS 14.3* 17.7* 15.7*  HGB 12.3* 11.4* 12.3*  HCT 36.4* 33.9* 35.9*  MCV 91 93 91  PLT 187 186 167 Large platelets present   Radiological Exams on Admission: Chest x-ray shows no acute abnormality  Assessment/Plan Active Problems:   Hyperthyroidism   Esophageal cancer   Transient global amnesia   Acute encephalopathy  Altered mental status  ? Cause, patient has been getting transient episodes of mental status changes especially after the chemotherapy. Patient's last chemotherapy was more than 2 weeks ago, and as per wife patient has not improved, rather he has become worse. Patient was recently started on Adderall and also on Marinol both of which can be very activating. Patient also has history of hyperthyroidism and was recently taken off Tapazole by his endocrinologist.  Will do the following  #1 discontinue Marinol, Adderall, Effexor all of which can cause sympathetic overactivity. #2  obtain UA, cultures and blood cultures, chest x-ray to rule out underlying infectious process as his white count is elevated with neutrophilia  #3  obtained CT head to rule out underlying neurological abnormality accounting for patient's symptoms  #4  will obtain TSH, free T4, as patient's Tapazole was recently discontinued by the endocrinologist  #5  if patient does not improve with above measures consider neurology consultation.  Esophageal cancer Patient is on chemotherapy with cis-platinum/ Taxotere, followed by oncology Dr. Marin Olp as  Outpatient/  Code status: patient is full code   Family discussion: Admission, patients condition and plan of care including tests being ordered  have been discussed with the patient and  who indicate understanding and agree with the plan and Code Status.   Time Spent on Admission:  65 minutes  [facility] Pager: [phone] 11/28/2014, 4:46 PM  If 7PM-7AM, please contact night-coverage  [email or web address]  Password [email or web address]

## 2014-11-28 NOTE — Plan of Care (Signed)
Problem: Phase I Progression Outcomes Goal: Pain controlled with appropriate interventions Outcome: Progressing Goal: OOB as tolerated unless otherwise ordered Outcome: Completed/Met Date Met:  11/28/14 Goal: Initial discharge plan identified Outcome: Completed/Met Date Met:  11/28/14 Goal: Voiding-avoid urinary catheter unless indicated Outcome: Completed/Met Date Met:  11/28/14 Goal: Hemodynamically stable Outcome: Completed/Met Date Met:  11/28/14 Goal: Other Phase I Outcomes/Goals Outcome: Completed/Met Date Met:  11/28/14  Problem: Phase II Progression Outcomes Goal: Progress activity as tolerated unless otherwise ordered Outcome: Progressing Goal: Obtain order to discontinue catheter if appropriate Outcome: Not Applicable Date Met:  30/15/99

## 2014-11-28 NOTE — Progress Notes (Signed)
Received call from Dr. Marin Olp to arrange direct admission on patient due to amnesia and encephalopathy. Etiology is unclear at this moment. Patient unsafe at home. Actively receiving chemotherapy for metastatic esophageal cancer. Please referred to Dr. Marin Olp note and recommendations.   Willie Owens [phone]

## 2014-11-28 NOTE — Progress Notes (Signed)
Hematology and Oncology Follow Up Visit  Willie Owens [id number] September 22, 1951 63 y.o. 11/28/2014   Principle Diagnosis:  Metastatic adenocarcinoma of the distal esophagus  Current Therapy:    Status post 3 cycles of cis-platinum/Taxotere     Interim History:  Willie Owens is back for follow-up. Unfortunately, his memory is going on him again. His wife called [place]. He just is declining at home with his mental status. He really is not sure of what he is doing. He has no idea where he is. He does not know the day or date.  I have evaluated him for a neurologic paraneoplastic process. The blood tests came back negative. He's not eating much. He is scheduled for a upper endoscopy next week. If he is hospitalized, he will need this as an inpatient. Pain has not been a problem.  He has incontinence. He just goes were he likes.  His wife just is having a hard time taking care of him.  I suspect that he will have to be admitted.  Overall, his performance status is ECOG 2-3.  Medications: No current facility-administered medications for this visit. No current outpatient prescriptions on file. Facility-Administered Medications Ordered in Other Visits: 0.9 %  sodium chloride infusion, 250 mL, Intravenous, PRN, Oswald Hillock, MD;  0.9 %  sodium chloride infusion, , Intravenous, Continuous, Oswald Hillock, MD;  acetaminophen (TYLENOL) tablet 650 mg, 650 mg, Oral, Q6H PRN **OR** acetaminophen (TYLENOL) suppository 650 mg, 650 mg, Rectal, Q6H PRN, Oswald Hillock, MD;  enoxaparin (LOVENOX) injection 40 mg, 40 mg, Subcutaneous, Q24H, Oswald Hillock, MD magnesium oxide (MAG-OX) tablet 800 mg, 800 mg, Oral, Daily, Oswald Hillock, MD;  ondansetron (ZOFRAN) tablet 4 mg, 4 mg, Oral, Q6H PRN **OR** ondansetron (ZOFRAN) injection 4 mg, 4 mg, Intravenous, Q6H PRN, Oswald Hillock, MD;  potassium chloride 10 mEq in 100 mL IVPB, 10 mEq, Intravenous, Q1 Hr x 3, Gagan S Lama, MD;  potassium chloride SA (K-DUR,KLOR-CON) CR  tablet 40 mEq, 40 mEq, Oral, BID, Oswald Hillock, MD propranolol ER (INDERAL LA) 24 hr capsule 80 mg, 80 mg, Oral, QHS, Oswald Hillock, MD;  sodium chloride 0.9 % injection 3 mL, 3 mL, Intravenous, Q12H, Oswald Hillock, MD;  sodium chloride 0.9 % injection 3 mL, 3 mL, Intravenous, PRN, Oswald Hillock, MD  Allergies:  Allergies  Allergen Reactions  . Penicillins Other (See Comments)    Hallucinations, from childhood    Past Medical History, Surgical history, Social history, and Family History were reviewed and updated.  Review of Systems: As above  Physical Exam:  height is 6\' 2"  (1.88 m) and weight is 190 lb (86.183 kg). His oral temperature is 97.6 F (36.4 C). His blood pressure is 121/81 and his pulse is 82. His respiration is 18.   Well-developed and well-nourished white gentleman. Head and neck exam shows alopecia. He has no ocular or oral lesions. There is no adenopathy in the neck. Lungs are clear. Cardiac exam regular rate and rhythm with no murmurs, rubs or bruits. Abdomen is soft. Has good bowel sounds. There is no fluid wave. There is no palpable liver or spleen tip. Back exam shows no tenderness over the spine, ribs or hips. Extremities shows no clubbing, cyanosis or edema. Skin exam shows no rashes, ecchymoses or petechia. Neurological exam is nonfocal. Again, he has no or very little short-term memory.  Lab Results  Component Value Date   WBC 18.4* 11/28/2014   HGB 12.3*  11/28/2014   HCT 35.9* 11/28/2014   MCV 91 11/28/2014   PLT 167 Large platelets present 11/28/2014     Chemistry      Component Value Date/Time   NA 134 11/28/2014 1239   NA 137 10/03/2014 0845   K 3.2* 11/28/2014 1239   K 4.4 10/03/2014 0845   CL 94* 11/28/2014 1239   CL 102 10/03/2014 0845   CO2 29 11/28/2014 1239   CO2 26 10/03/2014 0845   BUN 16 11/28/2014 1239   BUN 13 10/03/2014 0845   CREATININE 1.2 11/28/2014 1239   CREATININE 0.73 10/03/2014 0845      Component Value Date/Time   CALCIUM  8.3 11/28/2014 1239   CALCIUM 8.7 10/03/2014 0845   ALKPHOS 70 11/28/2014 1239   ALKPHOS 69 10/03/2014 0845   AST 22 11/28/2014 1239   AST 16 10/03/2014 0845   ALT 26 11/28/2014 1239   ALT 12 10/03/2014 0845   BILITOT 0.90 11/28/2014 1239   BILITOT 0.6 10/03/2014 0845         Impression and Plan: Willie Owens is 63 year old gentleman with metastatic adenocarcinoma the distal esophagus.  He is responding to treatment. He is actually tolerating treatment well.  This change in his mental status is really troublesome to me. I did check him for paraneoplastic neurological syndromes. All of his blood tests are normal.  I am not sure as to what is going on. He was admitted 3 months ago for something similar. He got better. He had an MRI. He had an EEG. He has not seen any type of neurology is a follow-up.  He's been have to be admitted. I will see him when he is hospitalized.  The one test that he has not had done yet is a lumbar puncture. I would think that if he had metastatic disease to his brain, then he would clearly have more neurological motor symptoms.  As far as further chemotherapy, this will really depend on whether or not his mental status improves.  I spent about 40-50 minutes with him today. Again I am absolutely puzzle as to why this neurological decompensation is happening. He will need a lumbar puncture in my opinion.   [name], MD 12/3/20155:30 PM

## 2014-11-29 ENCOUNTER — Inpatient Hospital Stay (HOSPITAL_COMMUNITY): Payer: 59

## 2014-11-29 ENCOUNTER — Other Ambulatory Visit: Payer: Self-pay | Admitting: Diagnostic Radiology

## 2014-11-29 DIAGNOSIS — C155 Malignant neoplasm of lower third of esophagus: Secondary | ICD-10-CM

## 2014-11-29 DIAGNOSIS — R630 Anorexia: Secondary | ICD-10-CM

## 2014-11-29 DIAGNOSIS — D72829 Elevated white blood cell count, unspecified: Secondary | ICD-10-CM

## 2014-11-29 DIAGNOSIS — D638 Anemia in other chronic diseases classified elsewhere: Secondary | ICD-10-CM

## 2014-11-29 DIAGNOSIS — R4182 Altered mental status, unspecified: Secondary | ICD-10-CM

## 2014-11-29 LAB — COMPREHENSIVE METABOLIC PANEL
ALK PHOS: 66 U/L (ref 39–117)
ALT: 17 U/L (ref 0–53)
ANION GAP: 11 (ref 5–15)
AST: 16 U/L (ref 0–37)
Albumin: 2.9 g/dL — ABNORMAL LOW (ref 3.5–5.2)
BILIRUBIN TOTAL: 0.6 mg/dL (ref 0.3–1.2)
BUN: 15 mg/dL (ref 6–23)
CHLORIDE: 95 meq/L — AB (ref 96–112)
CO2: 27 meq/L (ref 19–32)
Calcium: 8.1 mg/dL — ABNORMAL LOW (ref 8.4–10.5)
Creatinine, Ser: 0.98 mg/dL (ref 0.50–1.35)
GFR, EST NON AFRICAN AMERICAN: 86 mL/min — AB (ref >90)
Glucose, Bld: 92 mg/dL (ref 70–99)
Potassium: 3.3 mEq/L — ABNORMAL LOW (ref 3.7–5.3)
Sodium: 133 mEq/L — ABNORMAL LOW (ref 137–147)
Total Protein: 5.1 g/dL — ABNORMAL LOW (ref 6.0–8.3)

## 2014-11-29 LAB — LACTATE DEHYDROGENASE: LDH: 204 U/L (ref 94–250)

## 2014-11-29 LAB — CSF CELL COUNT WITH DIFFERENTIAL
RBC COUNT CSF: 1 /mm3 — AB
Tube #: 4
WBC CSF: 1 /mm3 (ref 0–5)

## 2014-11-29 LAB — GLUCOSE, CSF: GLUCOSE CSF: 60 mg/dL (ref 43–76)

## 2014-11-29 LAB — PROTEIN, CSF: Total  Protein, CSF: 54 mg/dL — ABNORMAL HIGH (ref 15–45)

## 2014-11-29 LAB — CBC
HCT: 30.5 % — ABNORMAL LOW (ref 39.0–52.0)
Hemoglobin: 10 g/dL — ABNORMAL LOW (ref 13.0–17.0)
MCH: 29.8 pg (ref 26.0–34.0)
MCHC: 32.8 g/dL (ref 30.0–36.0)
MCV: 90.8 fL (ref 78.0–100.0)
Platelets: 140 10*3/uL — ABNORMAL LOW (ref 150–400)
RBC: 3.36 MIL/uL — ABNORMAL LOW (ref 4.22–5.81)
RDW: 16.8 % — AB (ref 11.5–15.5)
WBC: 10.1 10*3/uL (ref 4.0–10.5)

## 2014-11-29 LAB — PREALBUMIN: Prealbumin: 30 mg/dL (ref 17.0–34.0)

## 2014-11-29 MED ORDER — LORAZEPAM 2 MG/ML IJ SOLN
1.0000 mg | Freq: Once | INTRAMUSCULAR | Status: DC
  Administered 2014-11-29: 1 mg via INTRAVENOUS

## 2014-11-29 MED ORDER — CLONAZEPAM 0.5 MG PO TABS
ORAL_TABLET | ORAL | Status: AC
Start: 2014-11-29 — End: 2014-11-30
  Filled 2014-11-29: qty 1

## 2014-11-29 MED ORDER — LEVOFLOXACIN IN D5W 500 MG/100ML IV SOLN
500.0000 mg | INTRAVENOUS | Status: DC
  Administered 2014-11-29: 500 mg via INTRAVENOUS
  Filled 2014-11-29 (×2): qty 100

## 2014-11-29 MED ORDER — CLONAZEPAM 0.5 MG PO TABS
0.5000 mg | ORAL_TABLET | Freq: Three times a day (TID) | ORAL | Status: DC | PRN
  Administered 2014-11-29 – 2014-12-01 (×3): 0.5 mg via ORAL
  Filled 2014-11-29 (×2): qty 1

## 2014-11-29 MED ORDER — LORAZEPAM 2 MG/ML IJ SOLN
INTRAMUSCULAR | Status: AC
  Filled 2014-11-29: qty 1

## 2014-11-29 MED ORDER — ENSURE COMPLETE PO LIQD
237.0000 mL | Freq: Two times a day (BID) | ORAL | Status: DC
  Administered 2014-11-29: 237 mL via ORAL

## 2014-11-29 NOTE — Consult Note (Signed)
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 Prior to admission, the patient demonstrated progressive confusion, Similar to prior hospitalization in September 2015 at which time he had been diagnosed with transient global amnesia. At the time, and MRI of the brain and EEG were unrevealing. During this admission, per chart report, his confusion is more evident. He denies any nausea or vomiting, or dizziness. He denies any vertigo. He denies any shortness of breath or chest pain. He denies any abdominal pain.He denies any dysuria or gross hematuria.He was afebrile. He states that he does not know why he was brought to the hospital. CT of the head without contrast reveals no acute abnormalities, with the exception of mild cerebral atrophy. His chemistries and CBCs were essentially unremarkable except for his white count elevated on admission, now normalized after antibiotic initiation. Urinalysis on admission was positive for nitrites. Urinary culture is pending. Chest x-ray did not reveal any abnormalities. Medications include albuterol and Marinol, which were started about 2 weeks ago, And he had been recently taken off Tapazole by his endocrinologist, for hyperthyroidism. His neurological status has not improved since admission, neurology evaluation is being entertained.  Oncological History:   Principle Diagnosis:  Metastatic adenocarcinoma of the distal esophagus diagnosed in July 2015  Current Therapy:   Status post 3 cycles of cis-platinum/Taxotere  Scheduled Meds: . levofloxacin (LEVAQUIN) IV  500 mg Intravenous Q24H  . magnesium oxide  800 mg  Oral Daily  . potassium chloride SA  40 mEq Oral BID  . propranolol ER  80 mg Oral QHS  . sodium chloride  3 mL Intravenous Q12H   Continuous Infusions: . sodium chloride 75 mL/hr at 11/28/14 1739   PRN Meds:sodium chloride, acetaminophen **OR** acetaminophen, HYDROcodone-acetaminophen, ondansetron **OR** ondansetron (ZOFRAN) IV, sodium chloride   Objective:  Filed Vitals:   11/29/14 0549  BP: 120/57  Pulse: 63  Temp: 98 F (36.7 C)  Resp: 18      Intake/Output Summary (Last 24 hours) at 11/29/14 9381 Last data filed at 11/29/14 0600  Gross per 24 hour  Intake 926.25 ml  Output      0 ml  Net 926.25 ml    ECOG PERFORMANCE STATUS: 3  GENERAL:Awake, confused too date, time and to person, in no distress and comfortable SKIN: skin color, texture, turgor are normal, no rashes or significant lesions EYES: normal, conjunctiva are pink and non-injected, sclera clear OROPHARYNX: no exudate, no erythema and lips, buccal mucosa, and tongue normal  NECK: supple, thyroid normal size, non-tender, without nodularity LYMPH:  no palpable lymphadenopathy in the cervical, axillary or inguinal LUNGS: clear to auscultation and percussion with normal breathing effort HEART: regular rate & rhythm and no murmurs and no lower extremity edema ABDOMEN:abdomen soft, non-tender and normal bowel sounds Musculoskeletal:no cyanosis of digits and no clubbing  NEURO: The patient follows commands, strength and sensation are intact, but confusion is noted  CBG (last 3)  No results for input(s): GLUCAP in the last 72 hours.   Labs:   Recent Labs Lab 11/22/14 1300 11/25/14 1207 11/28/14 1239 11/28/14 1809 11/29/14 0440  WBC 15.5* 19.6* 18.4* 18.5* 10.1  HGB 12.3* 11.4* 12.3* 11.6* 10.0*  HCT  36.4* 33.9* 35.9* 34.3* 30.5*  PLT 187 186 167 Large platelets present 157 140*  MCV 91 93 91 90.3 90.8  MCH 30.8 31.4 31.2 30.5 29.8  MCHC 33.8 33.6 34.3 33.8 32.8  RDW 16.5* 16.8* 16.4* 16.6* 16.8*   LYMPHSABS 0.7* 1.2 1.8  --   --   EOSABS 0.0 0.0 0.0  --   --   BASOSABS 0.0 0.1 0.1  --   --      Chemistries:    Recent Labs Lab 11/22/14 1300 11/22/14 1451 11/25/14 1207 11/28/14 1239 11/28/14 1809 11/29/14 0440  NA 139  --  134 134  --  133*  K 2.5*  --  4.3 3.2*  --  3.3*  CL 108  --  92* 94*  --  95*  CO2 26  --  28 29  --  27  GLUCOSE 86  --  112 105  --  92  BUN 14  --  18 16  --  15  CREATININE 0.8  --  1.0 1.2 1.13 0.98  CALCIUM 5.6*  --  8.6 8.3  --  8.1*  MG  --  0.9* 1.2*  --   --   --   AST 25  --  19 22  --  16  ALT 21  --  27 26  --  17  ALKPHOS 56  --  70 70  --  66  BILITOT 0.70  --  0.70 0.90  --  0.6    GFR Estimated Creatinine Clearance: 93.7 mL/min (by C-G formula based on Cr of 0.98).  Liver Function Tests:  Recent Labs Lab 11/22/14 1300 11/25/14 1207 11/28/14 1239 11/29/14 0440  AST 25 19 22 16   ALT 21 27 26 17   ALKPHOS 56 70 70 66  BILITOT 0.70 0.70 0.90 0.6  PROT 4.5* 5.8* 6.2* 5.1*  ALBUMIN  --   --   --  2.9*   No results for input(s): LIPASE, AMYLASE in the last 168 hours. No results for input(s): AMMONIA in the last 168 hours.  Urine Studies     Component Value Date/Time   COLORURINE YELLOW 11/28/2014 2036   APPEARANCEUR CLEAR 11/28/2014 2036   LABSPEC 1.026 11/28/2014 2036   PHURINE 5.5 11/28/2014 2036   GLUCOSEU NEGATIVE 11/28/2014 2036   HGBUR NEGATIVE 11/28/2014 2036   BILIRUBINUR SMALL* 11/28/2014 2036   KETONESUR NEGATIVE 11/28/2014 2036   PROTEINUR NEGATIVE 11/28/2014 2036   UROBILINOGEN 1.0 11/28/2014 2036   NITRITE POSITIVE* 11/28/2014 2036   LEUKOCYTESUR SMALL* 11/28/2014 2036    Thyroid function studies  Recent Labs  11/28/14 1809  TSH 1.750   Microbiology No results found for this or any previous visit (from the past 240 hour(s)).     Imaging Studies:  Dg Chest 2 View  11/28/2014   CLINICAL DATA:  Altered mental status.  Amnesia.  EXAM: CHEST  2 VIEW  COMPARISON:  09/17/2013  FINDINGS:  Right internal jugular Port-A-Cath place with its tip at the cavoatrial junction. Lungs are hyper aerated and clear. No pleural effusion or pneumothorax. Normal heart size.  IMPRESSION: No active cardiopulmonary disease.   Electronically Signed   By: Maryclare Bean M.D.   On: 11/28/2014 17:55   Ct Head Wo Contrast  11/28/2014   CLINICAL DATA:  Diffuse headache, dizziness and photophobia for 3 days, altered mental status, history esophageal cancer, the atrial fibrillation  EXAM: CT HEAD WITHOUT CONTRAST  TECHNIQUE: Contiguous axial images were obtained from the base  of the skull through the vertex without intravenous contrast.  COMPARISON:  MR brain 09/03/2014  FINDINGS: Generalized atrophy.  Normal ventricular morphology.  No midline shift or mass effect.  Otherwise normal appearance of brain parenchyma.  No intracranial hemorrhage, mass lesion or evidence acute infarction.  No extra-axial fluid collections.  Atherosclerotic calcifications at carotid siphons.  Bones demineralized.  Visualized paranasal sinuses and mastoid air cells clear.  IMPRESSION: Mild generalized atrophy.  No acute intracranial abnormalities.   Electronically Signed   By: Lavonia Dana M.D.   On: 11/28/2014 17:04    Assessment/Plan: 63 y.o. Metastatic esophageal cancer  Status post Cycle 3 chemotherapy with cisplatinum and Taxotere, Last given on November 19/2015, With Neulasta on 11/15/2014 The patient has demonstrated episodes of worsening confusion, which will have to improve or resolve prior to administration of further chemotherapy.  We will continue to follow  Altered mental status The patient has a history of global amnesia, diagnosed in September 2015, now with a recurrent episode. A CT of the head is negative for metastatic disease or other intracranial abnormalities, although mild generalized atrophy is seen Urinalysis showed nitrite in his urine, suspicious for UTI. Urinary cultures were drawn, which are currently pending.   He was placed on Levaquin, now day 2. Chest x-ray was normal If His status does not improve despite acute management, Neurological evaluation would be recommended, for further testing, which may include LP to surgery source  DVT prophylaxis On Lovenox  Anemia of chronic disease In the setting of malignancy and recent chemotherapy,possible infection, as well as hydration and antibiotics No bleeding issues are reported No transfusion is indicated at this time, continue to monitor  Leukocytosis Secondary to recent Neulasta, steroids, and possibly reactive to infection, now resolved after antibiotic initiation With Levaquin.  Cultures are pending  History of hyperthyroidism With TSH normal during this admission  Decreased oral intake Due to malignancy, The patient was on Marinol and they'll admission, consider nutritional evaluation while in the hospital  Full code Other medical issues as per admitting team     **Disclaimer: This note was dictated with voice recognition software. Similar sounding words can inadvertently be transcribed and this note may contain transcription errors which may not have been corrected upon publication of note.** WERTMAN,SARA E, PA-C 11/29/2014  9:37 AM   ADDENDUM:  I agree with the above. He does seem a little better today. Maybe his medications might be the issue. I very much appreciate the hospitalist help [place] out.  He does need a lumbar puncture. I need to make sure that she does not have carcinomatous meningitis causing the neurological issues.  He did have a CT scan done of the brain. This was negative.  On his exam, I really cannot find anything unusual.  His labs look okay. His white cell count has come down a little bit. Again he was on Neulasta. This is not surprising.  His LDH is okay. His prealbumin actually is quite good at 30.  I just think we have to see how his mental status evolves. F he is due for an upper endoscopy on Monday. I  spoke to the gastro-intestinal nurse. She will let Dr. West Carbo no of his admission. Hopefully this can be done on Monday. This will be a whole lot easier on him and his wife if this can be done as inpatient.  I will see him over the weekend.  Again, I am totally and that to the hospitalist for help in this out.  As always, they did a fantastic job!!!!  [employer]

## 2014-11-29 NOTE — Plan of Care (Signed)
Problem: Consults Goal: Skin Care Protocol Initiated - if Braden Score 18 or less If consults are not indicated, leave blank or document N/A  Outcome: Not Applicable Date Met:  18/93/73 Goal: Nutrition Consult-if indicated Outcome: Not Applicable Date Met:  74/96/64 Goal: Diabetes Guidelines if Diabetic/Glucose > 140 If diabetic or lab glucose is > 140 mg/dl - Initiate Diabetes/Hyperglycemia Guidelines & Document Interventions  Outcome: Not Applicable Date Met:  66/05/63  Problem: Phase I Progression Outcomes Goal: Pain controlled with appropriate interventions Outcome: Completed/Met Date Met:  11/29/14  Problem: Phase II Progression Outcomes Goal: Discharge plan established Outcome: Completed/Met Date Met:  11/29/14 Goal: Vital signs remain stable Outcome: Completed/Met Date Met:  11/29/14

## 2014-11-29 NOTE — Progress Notes (Signed)
CSW received referral that Pt has recently lost his job, wife doesn't have a job, limited resources.  CSW reviewed chart and noted that pt has acute encephalopathy and pt wife has expressed concern about pt mental status worsening since last chemotherapy.  CSW recommended PT evaluation to MD to further assess pt needs.  CSW to await PT recommendations in order to appropriately discuss with pt and pt wife resources/option.  CSW to complete full psychosocial once PT recommendations are available and CSW can assess pt needs.   CSW to continue to follow.  Alison Murray, MSW, [employer] [phone]

## 2014-11-29 NOTE — Progress Notes (Signed)
UA shows positive nitrite, will start IV Levaquin. Follow the culture results.

## 2014-11-29 NOTE — Progress Notes (Signed)
[facility] PROGRESS NOTE  Willie Owens QZR:[id number] DOB: [birth year] DOA: 11/28/2014 PCP: Chesley Noon, MD  Assessment/Plan: 1-acute encephalopathy and transient global a,nesia: presumed to be secondary to medications and UTI -will continue treatment with levaquin and follow cx's results -continue supportive care -Adderall, effexor and Marinol has been discontinued -will add low dose klonopin TID PRN for anxiety -follow results of LP  2-hyperthyroidism: mild elevation of free T4 and normal range TSH -was recently taken off tapazole  3-hypokalemia: will replete as needed  4-anxiety: will treat with low dose klonopin PRN  5-esophageal cancer: per oncology -plan is for potential EGD on Monday as planned  Code Status: Full Family Communication: wife at bedside Disposition Plan: to be determine    Consultants:  Oncology   Procedures:  LP 12/4  See below for x-ray reports   Antibiotics:  levaquin 12/4  HPI/Subjective: Feeling better, no CP and no SOB. Very anxious. Patient is status post LP. UA suggesting UTI. Denies HA's, blurred vision and neck rigidity   Objective: Filed Vitals:   11/29/14 1325  BP: 118/63  Pulse: 68  Temp: 97.6 F (36.4 C)  Resp: 16    Intake/Output Summary (Last 24 hours) at 11/29/14 1547 Last data filed at 11/29/14 1400  Gross per 24 hour  Intake 2106.25 ml  Output      0 ml  Net 2106.25 ml   Filed Weights   11/28/14 1552  Weight: 84.823 kg (187 lb)    Exam:   General:  AAOX3, no fever. Very anxious and saying he is good to be discharge and going back to work. Denies HA's  Cardiovascular: S1 and S2, no rubs or gallops  Respiratory: good air movement, scattered rhonchi  Abdomen: soft, NT, ND, positive BS  Musculoskeletal: no edema or cyanosis   Data Reviewed: Basic Metabolic Panel:  Recent Labs Lab 11/25/14 1207 11/28/14 1239 11/28/14 1809 11/29/14 0440  NA 134 134  --  133*  K 4.3 3.2*  --   3.3*  CL 92* 94*  --  95*  CO2 28 29  --  27  GLUCOSE 112 105  --  92  BUN 18 16  --  15  CREATININE 1.0 1.2 1.13 0.98  CALCIUM 8.6 8.3  --  8.1*  MG 1.2*  --   --   --    Liver Function Tests:  Recent Labs Lab 11/25/14 1207 11/28/14 1239 11/29/14 0440  AST 19 22 16   ALT 27 26 17   ALKPHOS 70 70 66  BILITOT 0.70 0.90 0.6  PROT 5.8* 6.2* 5.1*  ALBUMIN  --   --  2.9*   CBC:  Recent Labs Lab 11/25/14 1207 11/28/14 1239 11/28/14 1809 11/29/14 0440  WBC 19.6* 18.4* 18.5* 10.1  NEUTROABS 17.7* 15.7*  --   --   HGB 11.4* 12.3* 11.6* 10.0*  HCT 33.9* 35.9* 34.3* 30.5*  MCV 93 91 90.3 90.8  PLT 186 167 Large platelets present 157 140*    Studies: Dg Chest 2 View  11/28/2014   CLINICAL DATA:  Altered mental status.  Amnesia.  EXAM: CHEST  2 VIEW  COMPARISON:  09/17/2013  FINDINGS: Right internal jugular Port-A-Cath place with its tip at the cavoatrial junction. Lungs are hyper aerated and clear. No pleural effusion or pneumothorax. Normal heart size.  IMPRESSION: No active cardiopulmonary disease.   Electronically Signed   By: Maryclare Bean M.D.   On: 11/28/2014 17:55   Ct Head Wo Contrast  11/28/2014   CLINICAL  DATA:  Diffuse headache, dizziness and photophobia for 3 days, altered mental status, history esophageal cancer, the atrial fibrillation  EXAM: CT HEAD WITHOUT CONTRAST  TECHNIQUE: Contiguous axial images were obtained from the base of the skull through the vertex without intravenous contrast.  COMPARISON:  MR brain 09/03/2014  FINDINGS: Generalized atrophy.  Normal ventricular morphology.  No midline shift or mass effect.  Otherwise normal appearance of brain parenchyma.  No intracranial hemorrhage, mass lesion or evidence acute infarction.  No extra-axial fluid collections.  Atherosclerotic calcifications at carotid siphons.  Bones demineralized.  Visualized paranasal sinuses and mastoid air cells clear.  IMPRESSION: Mild generalized atrophy.  No acute intracranial  abnormalities.   Electronically Signed   By: Lavonia Dana M.D.   On: 11/28/2014 17:04   Dg Fluoro Guide Lumbar Puncture  11/29/2014   CLINICAL DATA:  Mental status change, history of [facility] spotted fever, history of esophageal cancer  EXAM: DIAGNOSTIC LUMBAR PUNCTURE UNDER FLUOROSCOPIC GUIDANCE  FLUOROSCOPY TIME:  9 seconds  PROCEDURE: Informed consent was obtained from the patient prior to the procedure, including potential complications of headache, allergy, and pain. With the patient prone, the lower back was prepped with Betadine. 1% Lidocaine was used for local anesthesia. Lumbar puncture was performed at the L3-4 level using a 22 gauge needle with return of clear CSF with an opening pressure of 7 cm water. 41ml of CSF were obtained for laboratory studies. The patient tolerated the procedure well and there were no apparent complications.  IMPRESSION: Successful fluoroscopic guided lumbar puncture.   Electronically Signed   By: Kathreen Devoid   On: 11/29/2014 15:40    Scheduled Meds: . feeding supplement (ENSURE COMPLETE)  237 mL Oral BID BM  . levofloxacin (LEVAQUIN) IV  500 mg Intravenous Q24H  . magnesium oxide  800 mg Oral Daily  . potassium chloride SA  40 mEq Oral BID  . propranolol ER  80 mg Oral QHS   Continuous Infusions: . sodium chloride 75 mL/hr at 11/29/14 1015    Active Problems:   Hyperthyroidism   Esophageal cancer   Transient global amnesia   Acute encephalopathy   Time spent:30 minutes    Barton Dubois  [facility] Pager [phone]. If 7PM-7AM, please contact night-coverage at [email or web address], password [facility] 11/29/2014, 3:47 PM  LOS: 1 day

## 2014-11-29 NOTE — Progress Notes (Signed)
Patient with severe anxiety after returning from lumbar puncture.  Patient crying and stating that he is "dying."  Patient stable and reassured.  Attempted to talk calmly with patient to ease his anxiety but this was not effective.  Patient's spouse at the bedside and visibly upset by her husband's anxiety.  Dr. Dyann Kief notified and order received for one time dose of IV ativan.  Zandra Abts [facility]  11/29/2014  3:11 PM

## 2014-11-29 NOTE — Progress Notes (Signed)
INITIAL NUTRITION ASSESSMENT  DOCUMENTATION CODES Per approved criteria  -Not Applicable   INTERVENTION: Ensure Complete po BID, each supplement provides 350 kcal and 13 grams of protein  NUTRITION DIAGNOSIS: Inadequate oral intake related to AMS as evidenced by poor appetite and poor po.   Goal: Pt to meet >/= 90% of their estimated nutrition needs   Monitor:  Weight trend, po intake, acceptance of supplements, labs  Reason for Assessment: MST  63 y.o. male  Admitting Dx: <principal problem not specified>  ASSESSMENT: 63 year old male who  has a past medical history of Atrial fibrillation; Hyperthyroidism; H/O [facility] spotted fever; Gout; History of antinuclear antibodies; GERD (gastroesophageal reflux disease); Cancer (06/2014); Esophageal cancer (07/26/2014); Maintenance chemotherapy; and Portacath in place. Today was brought to the hospital for worsening mental status.  - Spoke with pt who reported that his appetite has been poor since admission to the hospital. He says that he "just does not feel like eating." Pt reports a 3-5 lb weight loss recently. He agreed to try nutritional supplements.  - Pt with no signs of fat or muscle wasting.   Height: Ht Readings from Last 1 Encounters:  11/28/14 6\' 6"  (1.981 m)    Weight: Wt Readings from Last 1 Encounters:  11/28/14 187 lb (84.823 kg)    Ideal Body Weight: 91.4 kg  % Ideal Body Weight: 93%  Wt Readings from Last 10 Encounters:  11/28/14 187 lb (84.823 kg)  11/28/14 190 lb (86.183 kg)  11/26/14 190 lb (86.183 kg)  11/14/14 196 lb (88.905 kg)  10/24/14 203 lb (92.08 kg)  10/03/14 202 lb (91.627 kg)  09/10/14 178 lb (80.74 kg)  09/03/14 182 lb 12.8 oz (82.918 kg)  07/25/14 209 lb (94.802 kg)  07/18/14 209 lb (94.802 kg)    Usual Body Weight: "low 190's"  % Usual Body Weight: ~98%  BMI:  Body mass index is 21.61 kg/(m^2).  Estimated Nutritional Needs: Kcal: 2100-2300 Protein: 110-120 g Fluid:  2.1-2.3 L/day  Skin: Intact  Diet Order: Diet Heart  EDUCATION NEEDS: -Education needs addressed   Intake/Output Summary (Last 24 hours) at 11/29/14 1053 Last data filed at 11/29/14 0600  Gross per 24 hour  Intake 926.25 ml  Output      0 ml  Net 926.25 ml    Last BM: prior to admission   Labs:   Recent Labs Lab 11/22/14 1451 11/25/14 1207 11/28/14 1239 11/28/14 1809 11/29/14 0440  NA  --  134 134  --  133*  K  --  4.3 3.2*  --  3.3*  CL  --  92* 94*  --  95*  CO2  --  28 29  --  27  BUN  --  18 16  --  15  CREATININE  --  1.0 1.2 1.13 0.98  CALCIUM  --  8.6 8.3  --  8.1*  MG 0.9* 1.2*  --   --   --   GLUCOSE  --  112 105  --  92    CBG (last 3)  No results for input(s): GLUCAP in the last 72 hours.  Scheduled Meds: . levofloxacin (LEVAQUIN) IV  500 mg Intravenous Q24H  . magnesium oxide  800 mg Oral Daily  . potassium chloride SA  40 mEq Oral BID  . propranolol ER  80 mg Oral QHS    Continuous Infusions: . sodium chloride 75 mL/hr at 11/28/14 1739    Past Medical History  Diagnosis Date  . Atrial fibrillation  with RVR while hospitalized in 2014  . Hyperthyroidism     thyrotoxicosis , graves  . H/O [facility] spotted fever   . Gout   . History of antinuclear antibodies   . GERD (gastroesophageal reflux disease)   . Cancer 06/2014    esopageal ca-7/15  . Esophageal cancer 07/26/2014  . Maintenance chemotherapy     every 3 weeks/last chemo  was on 11/14/14  . Portacath in place     right side upper chest    Past Surgical History  Procedure Laterality Date  . Leg surgery      Fx, rod removed/right leg  . Transthoracic echocardiogram  4/292014    EF 55-60%; severe bi-atrial enlargement; RV mod dilated  . Cardioversion N/A 09/21/2013    Procedure: CARDIOVERSION;  Surgeon: Pixie Casino, MD;  Location: [facility] ENDOSCOPY;  Service: Cardiovascular;  Laterality: N/A;  . Tonsillectomy and adenoidectomy    . Porta cath      right side chest     Laurette Schimke MS, RD, LDN

## 2014-11-30 DIAGNOSIS — R040 Epistaxis: Secondary | ICD-10-CM | POA: Insufficient documentation

## 2014-11-30 DIAGNOSIS — R197 Diarrhea, unspecified: Secondary | ICD-10-CM

## 2014-11-30 LAB — BASIC METABOLIC PANEL
ANION GAP: 11 (ref 5–15)
BUN: 10 mg/dL (ref 6–23)
CO2: 26 mEq/L (ref 19–32)
Calcium: 8.3 mg/dL — ABNORMAL LOW (ref 8.4–10.5)
Chloride: 98 mEq/L (ref 96–112)
Creatinine, Ser: 0.84 mg/dL (ref 0.50–1.35)
GFR calc non Af Amer: >90 mL/min (ref >90)
Glucose, Bld: 89 mg/dL (ref 70–99)
POTASSIUM: 3.8 meq/L (ref 3.7–5.3)
Sodium: 135 mEq/L — ABNORMAL LOW (ref 137–147)

## 2014-11-30 LAB — CBC
HCT: 30 % — ABNORMAL LOW (ref 39.0–52.0)
Hemoglobin: 10.1 g/dL — ABNORMAL LOW (ref 13.0–17.0)
MCH: 31 pg (ref 26.0–34.0)
MCHC: 33.7 g/dL (ref 30.0–36.0)
MCV: 92 fL (ref 78.0–100.0)
Platelets: 132 10*3/uL — ABNORMAL LOW (ref 150–400)
RBC: 3.26 MIL/uL — ABNORMAL LOW (ref 4.22–5.81)
RDW: 16.8 % — AB (ref 11.5–15.5)
WBC: 12 10*3/uL — AB (ref 4.0–10.5)

## 2014-11-30 LAB — URINE CULTURE
CULTURE: NO GROWTH
Colony Count: NO GROWTH

## 2014-11-30 MED ORDER — IPRATROPIUM-ALBUTEROL 0.5-2.5 (3) MG/3ML IN SOLN: 3.0000 mL | Freq: Four times a day (QID) | RESPIRATORY_TRACT | Status: DC | PRN

## 2014-11-30 MED ORDER — SACCHAROMYCES BOULARDII 250 MG PO CAPS
250.0000 mg | ORAL_CAPSULE | Freq: Two times a day (BID) | ORAL | Status: DC
  Administered 2014-11-30 – 2014-12-02 (×5): 250 mg via ORAL
  Filled 2014-11-30 (×6): qty 1

## 2014-11-30 MED ORDER — ENOXAPARIN SODIUM 40 MG/0.4ML ~~LOC~~ SOLN
40.0000 mg | SUBCUTANEOUS | Status: DC
  Administered 2014-11-30: 40 mg via SUBCUTANEOUS
  Filled 2014-11-30 (×2): qty 0.4

## 2014-11-30 MED ORDER — PANTOPRAZOLE SODIUM 40 MG PO TBEC
40.0000 mg | DELAYED_RELEASE_TABLET | Freq: Every day | ORAL | Status: DC
  Administered 2014-11-30 – 2014-12-02 (×3): 40 mg via ORAL
  Filled 2014-11-30 (×3): qty 1

## 2014-11-30 NOTE — Progress Notes (Addendum)
Mr. Smeltzer seems to be getting his memory back a little bit. He does seem to be better this morning. I'm still not sure as to why.  He did have his lumbar puncture yesterday. So far, the fluid results have been pretty much negative. He has slightly increased protein. Glucose was normal. Culture was negative to date.  He had some diarrhea last night.  He ate better last night. He's had no nausea or vomiting.  He's had no fever. There's been no bleeding.  His wife was very concerned that she was told that he had to go to a rehabilitation facility. I might sure why this recommendation was made. However, he seems to be improving. She definitely can care for him at home.  I spoke to his gastroenterologist's nurse yesterday. He will do the upper endoscopy on Monday.   His vital signs look good on his physical exam. Blood pressure 129/66. He is afebrile. Pulse is 74. Lungs are clear with some expiratory wheezes, more so on left side. Cardiac exam regular rate and rhythm. Abdomen is soft. He has decent bowel sounds. There is no guarding or rebound tenderness. Extremity shows no clubbing, cyanosis or edema.  Again, his mental status seems to be improving. He knew who I was. He knew where he was. He could carry on a logical conversation.  I think it would be nice to follow watch him over the weekend. I will order some physical therapy for him. He needs his upper endoscopy. This would be a lot easier as inpatient.  As always, I very much appreciate all the great care that he is getting on the floor.  Lum Keas  Psalm 27:4

## 2014-11-30 NOTE — Progress Notes (Signed)
[facility] PROGRESS NOTE  Willie Owens WJX:[id number] DOB: July 18, 1951 DOA: 11/28/2014 PCP: Chesley Noon, MD  Assessment/Plan: 1-acute encephalopathy and transient global a,nesia: presumed to be secondary to medications and UTI -will continue treatment with levaquin and follow cx's results -continue supportive care -Adderall, effexor and Marinol has been discontinued -will continue low dose klonopin TID PRN for anxiety -follow results of LP (so far neg)  2-hyperthyroidism: mild elevation of free T4 and normal range TSH -was recently taken off tapazole -will need intermittent monitoring   3-hypokalemia: continue repletion as needed  4-anxiety: will treat with low dose klonopin PRN -improved and good response  5-esophageal cancer: per oncology -plan is for potential EGD on Monday as planned -EGD plan for Monday   6-epistaxis: will discontinue lovenox and use SCD's  7-diarrhea: continue probiotics  Code Status: Full Family Communication: wife at bedside Disposition Plan: to be determine    Consultants:  Oncology   Procedures:  LP 12/4  See below for x-ray reports   Antibiotics:  levaquin 12/4  HPI/Subjective: Feeling better, no CP and no SOB. Less anxious and AAOX3; able to follow multi-steps commands.  Complaining of epistaxis (X1) and diarrhea.   Objective: Filed Vitals:   11/30/14 0510  BP: 129/66  Pulse: 74  Temp: 97.9 F (36.6 C)  Resp: 16    Intake/Output Summary (Last 24 hours) at 11/30/14 1120 Last data filed at 11/29/14 1900  Gross per 24 hour  Intake   1080 ml  Output      0 ml  Net   1080 ml   Filed Weights   11/28/14 1552  Weight: 84.823 kg (187 lb)    Exam:   General:  AAOX3, no fever. Feeling much better and able to follow multi-steps commands. Reports complains of epistaxis and diarrhea. Good response to klonopin  Cardiovascular: S1 and S2, no rubs or gallops  Respiratory: good air movement, scattered  rhonchi  Abdomen: soft, NT, ND, positive BS  Musculoskeletal: no edema or cyanosis   Data Reviewed: Basic Metabolic Panel:  Recent Labs Lab 11/25/14 1207 11/28/14 1239 11/28/14 1809 11/29/14 0440 11/30/14 0500  NA 134 134  --  133* 135*  K 4.3 3.2*  --  3.3* 3.8  CL 92* 94*  --  95* 98  CO2 28 29  --  27 26  GLUCOSE 112 105  --  92 89  BUN 18 16  --  15 10  CREATININE 1.0 1.2 1.13 0.98 0.84  CALCIUM 8.6 8.3  --  8.1* 8.3*  MG 1.2*  --   --   --   --    Liver Function Tests:  Recent Labs Lab 11/25/14 1207 11/28/14 1239 11/29/14 0440  AST 19 22 16   ALT 27 26 17   ALKPHOS 70 70 66  BILITOT 0.70 0.90 0.6  PROT 5.8* 6.2* 5.1*  ALBUMIN  --   --  2.9*   CBC:  Recent Labs Lab 11/25/14 1207 11/28/14 1239 11/28/14 1809 11/29/14 0440 11/30/14 0500  WBC 19.6* 18.4* 18.5* 10.1 12.0*  NEUTROABS 17.7* 15.7*  --   --   --   HGB 11.4* 12.3* 11.6* 10.0* 10.1*  HCT 33.9* 35.9* 34.3* 30.5* 30.0*  MCV 93 91 90.3 90.8 92.0  PLT 186 167 Large platelets present 157 140* 132*    Studies: Dg Chest 2 View  11/28/2014   CLINICAL DATA:  Altered mental status.  Amnesia.  EXAM: CHEST  2 VIEW  COMPARISON:  09/17/2013  FINDINGS: Right internal  jugular Port-A-Cath place with its tip at the cavoatrial junction. Lungs are hyper aerated and clear. No pleural effusion or pneumothorax. Normal heart size.  IMPRESSION: No active cardiopulmonary disease.   Electronically Signed   By: Maryclare Bean M.D.   On: 11/28/2014 17:55   Ct Head Wo Contrast  11/28/2014   CLINICAL DATA:  Diffuse headache, dizziness and photophobia for 3 days, altered mental status, history esophageal cancer, the atrial fibrillation  EXAM: CT HEAD WITHOUT CONTRAST  TECHNIQUE: Contiguous axial images were obtained from the base of the skull through the vertex without intravenous contrast.  COMPARISON:  MR brain 09/03/2014  FINDINGS: Generalized atrophy.  Normal ventricular morphology.  No midline shift or mass effect.  Otherwise  normal appearance of brain parenchyma.  No intracranial hemorrhage, mass lesion or evidence acute infarction.  No extra-axial fluid collections.  Atherosclerotic calcifications at carotid siphons.  Bones demineralized.  Visualized paranasal sinuses and mastoid air cells clear.  IMPRESSION: Mild generalized atrophy.  No acute intracranial abnormalities.   Electronically Signed   By: Lavonia Dana M.D.   On: 11/28/2014 17:04   Dg Fluoro Guide Lumbar Puncture  11/29/2014   CLINICAL DATA:  Mental status change, history of [facility] spotted fever, history of esophageal cancer  EXAM: DIAGNOSTIC LUMBAR PUNCTURE UNDER FLUOROSCOPIC GUIDANCE  FLUOROSCOPY TIME:  9 seconds  PROCEDURE: Informed consent was obtained from the patient prior to the procedure, including potential complications of headache, allergy, and pain. With the patient prone, the lower back was prepped with Betadine. 1% Lidocaine was used for local anesthesia. Lumbar puncture was performed at the L3-4 level using a 22 gauge needle with return of clear CSF with an opening pressure of 7 cm water. 29ml of CSF were obtained for laboratory studies. The patient tolerated the procedure well and there were no apparent complications.  IMPRESSION: Successful fluoroscopic guided lumbar puncture.   Electronically Signed   By: Kathreen Devoid   On: 11/29/2014 15:40    Scheduled Meds: . magnesium oxide  800 mg Oral Daily  . potassium chloride SA  40 mEq Oral BID  . propranolol ER  80 mg Oral QHS  . saccharomyces boulardii  250 mg Oral BID   Continuous Infusions: . sodium chloride 50 mL/hr at 11/30/14 1042    Active Problems:   Hyperthyroidism   Esophageal cancer   Transient global amnesia   Acute encephalopathy   Time spent:30 minutes    Barton Dubois  [facility] Pager [phone]. If 7PM-7AM, please contact night-coverage at [email or web address], password [facility] 11/30/2014, 11:20 AM  LOS: 2 days

## 2014-11-30 NOTE — Evaluation (Signed)
Physical Therapy Evaluation Patient Details Name: Willie Owens MRN: [id number] DOB: 17-May-1951 Today's Date: 11/30/2014   History of Present Illness  63 year old male adm for worsening mental status; past medical history of Atrial fibrillation; Hyperthyroidism; H/O [facility] spotted fever; Gout; History of antinuclear antibodies; GERD (gastroesophageal reflux disease); Cancer (06/2014); Esophageal cancer (07/26/2014); Maintenance chemotherapy; and Portacath in place.  Clinical Impression  Pt seen for one time PT eval today, no further needs at this time; I have encouraged pt to amb with staff as tolerated; He is doing quite well from a mobility standpoint and mental status seems clear,  Appropriate, follows multi-step commands, etc without difficulty this am; PT will sign off; Thank you for this referral and if status should change, re-consult.    Follow Up Recommendations No PT follow up    Equipment Recommendations  None recommended by PT    Recommendations for Other Services       Precautions / Restrictions Precautions Precautions: None Restrictions Weight Bearing Restrictions: No      Mobility  Bed Mobility Overal bed mobility: Independent                Transfers Overall transfer level: Independent               General transfer comment: from lowest bed setting  Ambulation/Gait Ambulation/Gait assistance: Supervision;Independent Ambulation Distance (Feet): 360 Feet Assistive device: None Gait Pattern/deviations: WFL(Within Functional Limits)     General Gait Details: pt reports feeling amb is at his baseline; he does touch rail every now and then but not really using it for true support; no LOB without use of rail or other UE support;   Stairs            Wheelchair Mobility    Modified Rankin (Stroke Patients Only)       Balance Overall balance assessment: Independent Sitting-balance support: Feet supported;No upper extremity  supported Sitting balance-Leahy Scale: Normal       Standing balance-Leahy Scale: Normal               High level balance activites: Backward walking;Direction changes;Sudden stops;Head turns High Level Balance Comments: no LOB with activities above             Pertinent Vitals/Pain Pain Assessment: No/denies pain    Home Living Family/patient expects to be discharged to:: Private residence Living Arrangements: Spouse/significant other Available Help at Discharge: Family Type of Home: House Home Access: Stairs to enter   Technical brewer of Steps: 2 Home Layout: Multi-level Home Equipment: None      Prior Function Level of Independence: Independent               Hand Dominance        Extremity/Trunk Assessment   Upper Extremity Assessment: Overall WFL for tasks assessed           Lower Extremity Assessment: Overall WFL for tasks assessed         Communication   Communication: No difficulties  Cognition Arousal/Alertness: Awake/alert Behavior During Therapy: WFL for tasks assessed/performed Overall Cognitive Status: Within Functional Limits for tasks assessed                      General Comments      Exercises        Assessment/Plan    PT Assessment Patent does not need any further PT services  PT Diagnosis Difficulty walking   PT Problem List    PT Treatment  Interventions     PT Goals (Current goals can be found in the Care Plan section) Acute Rehab PT Goals Patient Stated Goal: home, back to active lifestyle PT Goal Formulation: All assessment and education complete, DC therapy    Frequency     Barriers to discharge        Co-evaluation               End of Session   Activity Tolerance: Patient tolerated treatment well Patient left: in chair;with call bell/phone within reach Nurse Communication: Mobility status         Time: 1046-1100 PT Time Calculation (min) (ACUTE ONLY): 14  min   Charges:   PT Evaluation $Initial PT Evaluation Tier I: 1 Procedure PT Treatments $Gait Training: 8-22 mins   PT G Codes:          Willie Owens 12-12-14, 11:04 AM
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
## 2014-12-01 LAB — CBC
HCT: 30.8 % — ABNORMAL LOW (ref 39.0–52.0)
Hemoglobin: 10.2 g/dL — ABNORMAL LOW (ref 13.0–17.0)
MCH: 31.4 pg (ref 26.0–34.0)
MCHC: 33.1 g/dL (ref 30.0–36.0)
MCV: 94.8 fL (ref 78.0–100.0)
Platelets: 135 10*3/uL — ABNORMAL LOW (ref 150–400)
RBC: 3.25 MIL/uL — ABNORMAL LOW (ref 4.22–5.81)
RDW: 17.1 % — ABNORMAL HIGH (ref 11.5–15.5)
WBC: 8.7 10*3/uL (ref 4.0–10.5)

## 2014-12-01 LAB — COMPREHENSIVE METABOLIC PANEL
ALBUMIN: 2.8 g/dL — AB (ref 3.5–5.2)
ALT: 14 U/L (ref 0–53)
ANION GAP: 12 (ref 5–15)
AST: 14 U/L (ref 0–37)
Alkaline Phosphatase: 56 U/L (ref 39–117)
BILIRUBIN TOTAL: 0.5 mg/dL (ref 0.3–1.2)
BUN: 8 mg/dL (ref 6–23)
CHLORIDE: 99 meq/L (ref 96–112)
CO2: 24 meq/L (ref 19–32)
CREATININE: 0.83 mg/dL (ref 0.50–1.35)
Calcium: 8.4 mg/dL (ref 8.4–10.5)
GFR calc Af Amer: >90 mL/min (ref >90)
GFR calc non Af Amer: >90 mL/min (ref >90)
Glucose, Bld: 88 mg/dL (ref 70–99)
Potassium: 4.2 mEq/L (ref 3.7–5.3)
SODIUM: 135 meq/L — AB (ref 137–147)
Total Protein: 5.2 g/dL — ABNORMAL LOW (ref 6.0–8.3)

## 2014-12-01 LAB — MAGNESIUM: Magnesium: 1.2 mg/dL — ABNORMAL LOW (ref 1.5–2.5)

## 2014-12-01 MED ORDER — LEVOFLOXACIN 500 MG PO TABS
500.0000 mg | ORAL_TABLET | Freq: Every day | ORAL | Status: DC
  Administered 2014-12-01 – 2014-12-02 (×2): 500 mg via ORAL
  Filled 2014-12-01 (×2): qty 1

## 2014-12-01 NOTE — Progress Notes (Signed)
[facility] PROGRESS NOTE  Willie Owens MGQ:[id number] DOB: July 13, 1951 DOA: 11/28/2014 PCP: Chesley Noon, MD  Assessment/Plan: 1-acute encephalopathy and transient global amnesia: presumed to be secondary to medications and UTI -will continue treatment with levaquin (plan is to complete a total of 7 days); cx's w/o isolated microorganism. -continue supportive care -Adderall, effexor and Marinol has been discontinued -will continue low dose klonopin TID PRN for anxiety -follow results of LP (so far neg)  2-hyperthyroidism: mild elevation of free T4 and normal range TSH -was recently taken off tapazole -will need intermittent monitoring   3-hypokalemia: continue repletion as needed  4-anxiety:  -will continue tx with klonopin  -mood is improved   5-esophageal cancer: per oncology -plan is for potential EGD on Monday as planned  6-epistaxis: no further episodes of epistaxis. Will continue avoiding heparin products.  7-Diarrhea: continue probiotics  DVT: SCD's  Code Status: Full Family Communication: wife at bedside Disposition Plan: to be determine    Consultants:  Oncology   GI  Procedures:  LP 12/4  See below for x-ray reports   Antibiotics:  levaquin 12/4  HPI/Subjective: AAOX3 (slightly less lucid than yesterday 12/5), no fever. Feeling much better overall and able to follow multi-steps commands. Reports no further episodes of epistaxis and diarrhea is improving.   Objective: Filed Vitals:   12/01/14 0501  BP: 134/70  Pulse: 70  Temp: 97.8 F (36.6 C)  Resp: 16    Intake/Output Summary (Last 24 hours) at 12/01/14 1249 Last data filed at 12/01/14 0600  Gross per 24 hour  Intake    480 ml  Output      0 ml  Net    480 ml   Filed Weights   11/28/14 1552  Weight: 84.823 kg (187 lb)    Exam:   General:  AAOX3 (slightly less lucid than yesterday 12/5), no fever. Feeling much better overall and able to follow multi-steps  commands. Reports no further episodes of epistaxis and diarrhea is improving.   Cardiovascular: S1 and S2, no rubs or gallops  Respiratory: good air movement, scattered rhonchi  Abdomen: soft, NT, ND, positive BS  Musculoskeletal: no edema or cyanosis   Data Reviewed: Basic Metabolic Panel:  Recent Labs Lab 11/25/14 1207 11/28/14 1239 11/28/14 1809 11/29/14 0440 11/30/14 0500 12/01/14 0600  NA 134 134  --  133* 135* 135*  K 4.3 3.2*  --  3.3* 3.8 4.2  CL 92* 94*  --  95* 98 99  CO2 28 29  --  27 26 24   GLUCOSE 112 105  --  92 89 88  BUN 18 16  --  15 10 8   CREATININE 1.0 1.2 1.13 0.98 0.84 0.83  CALCIUM 8.6 8.3  --  8.1* 8.3* 8.4  MG 1.2*  --   --   --   --  1.2*   Liver Function Tests:  Recent Labs Lab 11/25/14 1207 11/28/14 1239 11/29/14 0440 12/01/14 0600  AST 19 22 16 14   ALT 27 26 17 14   ALKPHOS 70 70 66 56  BILITOT 0.70 0.90 0.6 0.5  PROT 5.8* 6.2* 5.1* 5.2*  ALBUMIN  --   --  2.9* 2.8*   CBC:  Recent Labs Lab 11/25/14 1207 11/28/14 1239 11/28/14 1809 11/29/14 0440 11/30/14 0500 12/01/14 0600  WBC 19.6* 18.4* 18.5* 10.1 12.0* 8.7  NEUTROABS 17.7* 15.7*  --   --   --   --   HGB 11.4* 12.3* 11.6* 10.0* 10.1* 10.2*  HCT 33.9*  35.9* 34.3* 30.5* 30.0* 30.8*  MCV 93 91 90.3 90.8 92.0 94.8  PLT 186 167 Large platelets present 157 140* 132* 135*   Studies: Dg Fluoro Guide Lumbar Puncture  11/29/2014   CLINICAL DATA:  Mental status change, history of [facility] spotted fever, history of esophageal cancer  EXAM: DIAGNOSTIC LUMBAR PUNCTURE UNDER FLUOROSCOPIC GUIDANCE  FLUOROSCOPY TIME:  9 seconds  PROCEDURE: Informed consent was obtained from the patient prior to the procedure, including potential complications of headache, allergy, and pain. With the patient prone, the lower back was prepped with Betadine. 1% Lidocaine was used for local anesthesia. Lumbar puncture was performed at the L3-4 level using a 22 gauge needle with return of clear CSF with an  opening pressure of 7 cm water. 30ml of CSF were obtained for laboratory studies. The patient tolerated the procedure well and there were no apparent complications.  IMPRESSION: Successful fluoroscopic guided lumbar puncture.   Electronically Signed   By: Kathreen Devoid   On: 11/29/2014 15:40    Scheduled Meds: . magnesium oxide  800 mg Oral Daily  . pantoprazole  40 mg Oral Daily  . potassium chloride SA  40 mEq Oral BID  . propranolol ER  80 mg Oral QHS  . saccharomyces boulardii  250 mg Oral BID   Continuous Infusions: . sodium chloride 50 mL/hr at 12/01/14 1239    Active Problems:   Hyperthyroidism   Esophageal cancer   Transient global amnesia   Acute encephalopathy   Diarrhea   Epistaxis   Time spent: 30 minutes    Barton Dubois  [facility] Pager [phone]. If 7PM-7AM, please contact night-coverage at [email or web address], password [facility] 12/01/2014, 12:49 PM  LOS: 3 days

## 2014-12-01 NOTE — Progress Notes (Signed)
Willie Owens is not as lucid this morning. Again, I'm not sure as to why this is. We will await the results from his cytology on the spinal fluid.  His labs look okay. I cannot find anything on his physical exam that looks unusual.   I think the diarrhea is better. According to his wife, he is not eating that much.  There's been no obvious pain. He's had improved with his breathing. I think he maybe got 1 or 2 nebulizer treatments yesterday.  On his physical exam, his vital signs are stable. His vital signs show temperature of 97.8. Pulse 70. Blood pressure 134/70. His oxygen saturation is 99%. Head and neck exam shows no ocular or oral lesions. There are no palpable cervical or supraclavicular lymph nodes. Lungs are clear. Cardiac exam regular rate and rhythm. He has no murmurs, rubs or bruits. Abdomen soft. Has good bowel sounds. Extremities shows no clubbing, cyanosis or edema.  Again, I have no clue as to why he is having this mental status issue. I cannot say that this is from chemotherapy. The medicines that we had been using on him do not cause these issues. We have checked him for a neurologic paraneoplastic process. This is been negative. Again it might be that he has a paraneoplastic process. If so, I don't think there is any therapy for her.  We will see what the cytology shows on the spinal fluid.  He is supposed to have the upper endoscopy tomorrow. Dr. Hilarie Fredrickson of [facility] is supposed to be doing this.  I appreciate all the great care that he is getting from all the staff on [address]:22

## 2014-12-02 ENCOUNTER — Telehealth: Payer: Self-pay

## 2014-12-02 ENCOUNTER — Encounter: Payer: 59 | Admitting: Internal Medicine

## 2014-12-02 ENCOUNTER — Other Ambulatory Visit: Payer: Self-pay

## 2014-12-02 DIAGNOSIS — C159 Malignant neoplasm of esophagus, unspecified: Secondary | ICD-10-CM

## 2014-12-02 MED ORDER — LEVOFLOXACIN 500 MG PO TABS: 500.0000 mg | ORAL_TABLET | Freq: Every day | ORAL | Status: DC

## 2014-12-02 MED ORDER — CLONAZEPAM 0.5 MG PO TABS: 0.5000 mg | ORAL_TABLET | Freq: Three times a day (TID) | ORAL | Status: DC | PRN

## 2014-12-02 MED ORDER — HEPARIN SOD (PORK) LOCK FLUSH 100 UNIT/ML IV SOLN
500.0000 [IU] | Freq: Once | INTRAVENOUS | Status: AC
  Administered 2014-12-02: 500 [IU] via INTRAVENOUS
  Filled 2014-12-02: qty 5

## 2014-12-02 NOTE — Progress Notes (Signed)
CSW continuing to follow.  Pt discharged home before CSW to able to follow up to provide support.  CSW noted that weekend CSW was able to provide information on financial resources.  CSW contacted [facility] CSW, Johnnye Lana and notified her that pt may benefit from outpatient social work follow up.  No further social work needs identified at this time.  CSW signing off.   Alison Murray, MSW, [place] Work [phone]

## 2014-12-02 NOTE — Telephone Encounter (Signed)
Pt scheduled for EGD in the [facility] 12/04/14@9am . Wife aware of appt and knows pt to be NPO after midnight. Pt not coming for previsit, Remo Lipps aware.

## 2014-12-02 NOTE — Discharge Summary (Signed)
Physician Discharge Summary  Willie Owens PYP:[id number] DOB: September 11, 1951 DOA: 11/28/2014  PCP: Chesley Noon, MD  Admit date: 11/28/2014 Discharge date: 12/02/2014  Time spent: >30 minutes  Recommendations for Outpatient Follow-up:  Repeat BMEt to follow electrolytes and renal function Please recheck CBC to follow Hgb and platelets trend Reassess patient mentation and response to medication  Discharge Diagnoses:  Active Problems:   Hyperthyroidism   Esophageal cancer   Transient global amnesia   Acute encephalopathy   Diarrhea   Epistaxis UTI Insomnia   Discharge Condition: stable and improved. Patient will follow with PCP, oncology service and GI service for EGD.  Diet recommendation: regular diet  Filed Weights   11/28/14 1552  Weight: 84.823 kg (187 lb)    History of present illness:  63 year old male who has a past medical history of Atrial fibrillation; Hyperthyroidism; H/O [facility] spotted fever; Gout; History of antinuclear antibodies; GERD (gastroesophageal reflux disease); Esophageal cancer (07/26/2014); Maintenance chemotherapy; and Portacath in place. Who was brought to the hospital for worsening mental status. Patient has history of metastatic adenocarcinoma of distal esophagus, status post chemotherapy with cis-platinum/Taxotere. Patient has been having memory issues since starting chemotherapy, as per wife he usually gets memory problems where he would be behaving inappropriately and confused which would last few days and then get better of its own. Patient also was admitted in September 2015 for the same at that time he was seen by neurology and was diagnosed with transient global amnesia. Patient also underwent an MRI of the brain as well as EEG which did not reveal any significant abnormality. Patient also has been started on Marinol as well as Adderall almost a week ago but patient's wife feels that patient has been getting worse, he was unable to  sleep at night, has been more confused. Patient is unable to provide any significant history but as per wife patient did not have nausea vomiting or diarrhea. There was no dysuria urgency or frequency of urination. He denied any chest pain or shortness of breath.  Hospital Course:  1-acute encephalopathy and transient global amnesia: presumed to be secondary to medications and UTI -will treat with levaquin for 5 more days to finish antibiotic therapy -patient advise to keep himself well hydrated -Adderall, effexor and Marinol has been discontinued -will continue low dose klonopin TID PRN for anxiety -follow results of LP (so far neg); oncology service will follow final results and treat accordingly as needed   2-hyperthyroidism: mild elevation of free T4 and normal range TSH -was recently taken off tapazole -will need intermittent monitoring   3-hypokalemia: repleted and WNL at discharge -will monitor.  4-anxiety:  -will continue tx with klonopin  -mood is improved and stable at discharge  5-esophageal cancer: per oncology -plan is for EGD on 12/9 as an outpatient   6-epistaxis: no further episodes of epistaxis.  -advise to stop picking on his nose and to be careful when blowing nose out.  7-Diarrhea: continue probiotics -C. Diff has been neg in multiple occasions -patient endorses at discharge diarrhea is pretty much resolved.  8-insomnia: continue QHS trazodone   9-UTI: treatment as mentioned above with the use of levaquin.  *rest of medical problems remains stable and the plan is to continue current medication regimen and follow up with PCP for medication adjustments as needed.  Procedures:  LP 12/4 (up to moment of discharge no abnormalities seen on LP results)  Consultations:  Oncology  GI  Discharge Exam: Filed Vitals:   12/02/14 2458  BP: 136/70  Pulse: 71  Temp: 98.1 F (36.7 C)  Resp: 16    General: AAOX3 and pretty much at baseline according to  wife; no fever. Feeling much better overall and able to follow multi-steps commands. No further episodes of epistaxis appreciated and diarrhea improving.   Cardiovascular: S1 and S2, no rubs or gallops  Respiratory: good air movement, scattered rhonchi; no wheezing.  Abdomen: soft, NT, ND, positive BS  Musculoskeletal: no edema or cyanosis   Discharge Instructions You were cared for by a hospitalist during your hospital stay. If you have any questions about your discharge medications or the care you received while you were in the hospital after you are discharged, you can call the unit and asked to speak with the hospitalist on call if the hospitalist that took care of you is not available. Once you are discharged, your primary care physician will handle any further medical issues. Please note that NO REFILLS for any discharge medications will be authorized once you are discharged, as it is imperative that you return to your primary care physician (or establish a relationship with a primary care physician if you do not have one) for your aftercare needs so that they can reassess your need for medications and monitor your lab values.  Discharge Instructions    Discharge instructions    Complete by:  As directed   Maintain good hydration Take medications as prescribed Follow with GI service as instructed on (129/15) Call Dr. Marin Olp office for info regarding follow up appointment          Current Discharge Medication List    START taking these medications   Details  clonazePAM (KLONOPIN) 0.5 MG tablet Take 1 tablet (0.5 mg total) by mouth 3 (three) times daily as needed (anxiety). Qty: 30 tablet, Refills: 0    levofloxacin (LEVAQUIN) 500 MG tablet Take 1 tablet (500 mg total) by mouth daily. Qty: 5 tablet, Refills: 0      CONTINUE these medications which have NOT CHANGED   Details  B Complex-C (SUPER B COMPLEX PO) Take 1 tablet by mouth daily.    Cholecalciferol (VITAMIN D) 2000  UNITS tablet Take 2,000 Units by mouth daily.    dexamethasone (DECADRON) 4 MG tablet Take 8 mg by mouth 2 (two) times daily with a meal. Start the day after chemo for three days    diphenoxylate-atropine (LOMOTIL) 2.5-0.025 MG per tablet Take 1 tablet by mouth 4 (four) times daily as needed for diarrhea or loose stools. Qty: 30 tablet, Refills: 2   Associated Diagnoses: Esophageal cancer    esomeprazole (NEXIUM) 40 MG capsule Take 40 mg by mouth 2 (two) times daily before a meal.    lidocaine-prilocaine (EMLA) cream Apply 1 application topically as needed. Qty: 30 g, Refills: 10    Multiple Vitamins-Minerals (MULTIVITAMIN GUMMIES ADULT PO) Take by mouth every morning.   Associated Diagnoses: Community acquired pneumonia; Esophageal cancer    Probiotic Product (ALIGN) 4 MG CAPS Take by mouth every morning.   Associated Diagnoses: Acute confusional state; Esophageal cancer    prochlorperazine (COMPAZINE) 10 MG tablet Take 10 mg by mouth as needed for nausea or vomiting.     propranolol ER (INDERAL LA) 80 MG 24 hr capsule Take 80 mg by mouth at bedtime.    traZODone (DESYREL) 50 MG tablet Take 1 tablet (50 mg total) by mouth at bedtime. Qty: 30 tablet, Refills: 3   Associated Diagnoses: Insomnia      STOP taking these  medications     LORazepam (ATIVAN) 0.5 MG tablet      metroNIDAZOLE (FLAGYL) 500 MG tablet      magnesium oxide (MAG-OX) 400 (241.3 MG) MG tablet      potassium chloride SA (K-DUR,KLOR-CON) 20 MEQ tablet        Allergies  Allergen Reactions  . Penicillins Other (See Comments)    Hallucinations, from childhood   Follow-up Information    Follow up with BADGER,MICHAEL C, MD. Schedule an appointment as soon as possible for a visit in 10 days.   Specialty:  Family Medicine   Contact information:   [place] [address] [phone]       Follow up with Jerene Bears, MD On 12/04/2014.   Specialty:  Gastroenterology   Contact  information:   [address] [place] [address] [phone]       Follow up with [name], MD.   Specialty:  Oncology   Why:  office for follow up appointment details    Contact information:   [place], [place] [address] [phone]       The results of significant diagnostics from this hospitalization (including imaging, microbiology, ancillary and laboratory) are listed below for reference.    Significant Diagnostic Studies: Dg Chest 2 View  11/28/2014   CLINICAL DATA:  Altered mental status.  Amnesia.  EXAM: CHEST  2 VIEW  COMPARISON:  09/17/2013  FINDINGS: Right internal jugular Port-A-Cath place with its tip at the cavoatrial junction. Lungs are hyper aerated and clear. No pleural effusion or pneumothorax. Normal heart size.  IMPRESSION: No active cardiopulmonary disease.   Electronically Signed   By: Maryclare Bean M.D.   On: 11/28/2014 17:55   Ct Head Wo Contrast  11/28/2014   CLINICAL DATA:  Diffuse headache, dizziness and photophobia for 3 days, altered mental status, history esophageal cancer, the atrial fibrillation  EXAM: CT HEAD WITHOUT CONTRAST  TECHNIQUE: Contiguous axial images were obtained from the base of the skull through the vertex without intravenous contrast.  COMPARISON:  MR brain 09/03/2014  FINDINGS: Generalized atrophy.  Normal ventricular morphology.  No midline shift or mass effect.  Otherwise normal appearance of brain parenchyma.  No intracranial hemorrhage, mass lesion or evidence acute infarction.  No extra-axial fluid collections.  Atherosclerotic calcifications at carotid siphons.  Bones demineralized.  Visualized paranasal sinuses and mastoid air cells clear.  IMPRESSION: Mild generalized atrophy.  No acute intracranial abnormalities.   Electronically Signed   By: Lavonia Dana M.D.   On: 11/28/2014 17:04   Nm Pet Image Restag (ps) Skull Base To Thigh  11/07/2014   CLINICAL DATA:  Subsequent treatment strategy for  esophageal cancer.  EXAM: NUCLEAR MEDICINE PET SKULL BASE TO THIGH  TECHNIQUE: 10.0 mCi F-18 FDG was injected intravenously. Full-ring PET imaging was performed from the skull base to thigh after the radiotracer. CT data was obtained and used for attenuation correction and anatomic localization.  FASTING BLOOD GLUCOSE:  Value: 90 mg/dl  COMPARISON:  09/09/2005  FINDINGS: NECK  No hypermetabolic lymph nodes in the neck.  CHEST  No suspicious pulmonary nodules on the CT scan.  No hypermetabolic mediastinal or hilar nodes.  Mild diffuse mid/distal esophageal hypermetabolism, nonspecific, max SUV 4.4. No discrete mass is evident on CT.  Small hiatal hernia.  ABDOMEN/PELVIS  No abnormal hypermetabolic activity within the liver, pancreas, adrenal glands, or spleen.  12 mm short axis gastrohepatic ligament node, decreased, max SUV 3.0.  8 mm short axis left para-aortic node, decreased, without appreciable hypermetabolism.  SKELETON  No focal hypermetabolic activity to suggest skeletal metastasis.  IMPRESSION: Mild diffuse mid/distal esophageal hypermetabolism, nonspecific, without discrete mass on CT.  Small residual hypermetabolic gastrohepatic ligament node, decreased.   Electronically Signed   By: Julian Hy M.D.   On: 11/07/2014 10:05   Dg Fluoro Guide Lumbar Puncture  11/29/2014   CLINICAL DATA:  Mental status change, history of [facility] spotted fever, history of esophageal cancer  EXAM: DIAGNOSTIC LUMBAR PUNCTURE UNDER FLUOROSCOPIC GUIDANCE  FLUOROSCOPY TIME:  9 seconds  PROCEDURE: Informed consent was obtained from the patient prior to the procedure, including potential complications of headache, allergy, and pain. With the patient prone, the lower back was prepped with Betadine. 1% Lidocaine was used for local anesthesia. Lumbar puncture was performed at the L3-4 level using a 22 gauge needle with return of clear CSF with an opening pressure of 7 cm water. 29ml of CSF were obtained for laboratory  studies. The patient tolerated the procedure well and there were no apparent complications.  IMPRESSION: Successful fluoroscopic guided lumbar puncture.   Electronically Signed   By: Kathreen Devoid   On: 11/29/2014 15:40    Microbiology: Recent Results (from the past 240 hour(s))  Culture, blood (routine x 2)     Status: None (Preliminary result)   Collection Time: 11/28/14  6:09 PM  Result Value Ref Range Status   Specimen Description BLOOD LEFT ARM  Final   Special Requests BOTTLES DRAWN AEROBIC AND ANAEROBIC 10CC  Final   Culture  Setup Time   Final    11/29/2014 07:51 Performed at [facility]    Culture   Final           BLOOD CULTURE RECEIVED NO GROWTH TO DATE CULTURE WILL BE HELD FOR 5 DAYS BEFORE ISSUING A FINAL NEGATIVE REPORT Performed at [facility]    Report Status PENDING  Incomplete  Culture, blood (routine x 2)     Status: None (Preliminary result)   Collection Time: 11/28/14  6:29 PM  Result Value Ref Range Status   Specimen Description BLOOD RIGHT ARM  Final   Special Requests BOTTLES DRAWN AEROBIC AND ANAEROBIC 10CC  Final   Culture  Setup Time   Final    11/29/2014 07:51 Performed at [facility]    Culture   Final           BLOOD CULTURE RECEIVED NO GROWTH TO DATE CULTURE WILL BE HELD FOR 5 DAYS BEFORE ISSUING A FINAL NEGATIVE REPORT Performed at [facility]    Report Status PENDING  Incomplete  Culture, Urine     Status: None   Collection Time: 11/28/14  8:36 PM  Result Value Ref Range Status   Specimen Description URINE, RANDOM  Final   Special Requests NONE  Final   Culture  Setup Time   Final    11/29/2014 01:25 Performed at [place] Performed at [facility]   Final   Culture NO GROWTH Performed at [facility]   Final   Report Status 11/30/2014 FINAL  Final  CSF culture     Status: None (Preliminary result)   Collection Time: 11/29/14  1:58 PM  Result  Value Ref Range Status   Specimen Description CSF  Final   Special Requests NONE  Final   Gram Stain   Final    WBC PRESENT,BOTH  PMN AND MONONUCLEAR NO ORGANISMS SEEN CYTOSPIN Performed at [facility]    Culture   Final    NO GROWTH 2 DAYS Performed at [facility]    Report Status PENDING  Incomplete     Labs: Basic Metabolic Panel:  Recent Labs Lab 11/25/14 1207 11/28/14 1239 11/28/14 1809 11/29/14 0440 11/30/14 0500 12/01/14 0600  NA 134 134  --  133* 135* 135*  K 4.3 3.2*  --  3.3* 3.8 4.2  CL 92* 94*  --  95* 98 99  CO2 28 29  --  27 26 24   GLUCOSE 112 105  --  92 89 88  BUN 18 16  --  15 10 8   CREATININE 1.0 1.2 1.13 0.98 0.84 0.83  CALCIUM 8.6 8.3  --  8.1* 8.3* 8.4  MG 1.2*  --   --   --   --  1.2*   Liver Function Tests:  Recent Labs Lab 11/25/14 1207 11/28/14 1239 11/29/14 0440 12/01/14 0600  AST 19 22 16 14   ALT 27 26 17 14   ALKPHOS 70 70 66 56  BILITOT 0.70 0.90 0.6 0.5  PROT 5.8* 6.2* 5.1* 5.2*  ALBUMIN  --   --  2.9* 2.8*   CBC:  Recent Labs Lab 11/25/14 1207 11/28/14 1239 11/28/14 1809 11/29/14 0440 11/30/14 0500 12/01/14 0600  WBC 19.6* 18.4* 18.5* 10.1 12.0* 8.7  NEUTROABS 17.7* 15.7*  --   --   --   --   HGB 11.4* 12.3* 11.6* 10.0* 10.1* 10.2*  HCT 33.9* 35.9* 34.3* 30.5* 30.0* 30.8*  MCV 93 91 90.3 90.8 92.0 94.8  PLT 186 167 Large platelets present 157 140* 132* 135*   Signed:  Barton Dubois  [facility] 12/02/2014, 10:05 AM

## 2014-12-02 NOTE — Progress Notes (Signed)
Willie Owens seems to be doing a little bit better today. He seems to be a little bit more alert.  He is supposed to have the upper endoscopy today. He is nothing by mouth. I have not yet seen any note from gastroenterology.  He ate well yesterday. His diarrhea is better.  The cytology on his CSF should be back today.  He is not hurting.  His no cough. He has a little bit of wheezing.  There is no bleeding. He's had no nausea or vomiting.  On his physical exam, all as vital signs stable. Temperature 98.1. Blood pressure 136/70. His head and neck exam shows no ocular or oral lesions. There are no palpable cervical or supraclavicular lymph nodes. Lungs are clear. Cardiac exam regular rate and rhythm. Abdomen is soft. Has good bowel sounds. There is no fluid wave. There is no palpable liver or spleen tip. Neurological exam shows no focal neurological deficits. There is still some disorientation. He is very pleasant.  From my point of view, he can be discharged. I still think we can do anything else for him. All the tests that need to be done have been done. He just has to have the upper endoscopy.  I appreciate the gray care that he's gotten on the floor from all the staff and the hospitalist. You all did a fantastic job!!!   Laurey Arrow E.  Isaiah 41:10

## 2014-12-02 NOTE — Telephone Encounter (Signed)
-----   Message from Irene Shipper, MD sent at 12/02/2014  8:30 AM EST ----- I spoke with Dr. Hilarie Fredrickson regarding Mr. Dimmick. He is being discharged home from the hospital today. He needs a diagnostic EGD as outpatient. Thanks

## 2014-12-03 LAB — CSF CULTURE

## 2014-12-03 LAB — CSF CULTURE W GRAM STAIN: Culture: NO GROWTH

## 2014-12-04 ENCOUNTER — Encounter: Payer: Self-pay | Admitting: Internal Medicine

## 2014-12-04 ENCOUNTER — Ambulatory Visit (AMBULATORY_SURGERY_CENTER): Payer: 59 | Admitting: Internal Medicine

## 2014-12-04 VITALS — BP 138/75 | HR 81 | Temp 97.6°F | Resp 13 | Ht 78.0 in | Wt 190.0 lb

## 2014-12-04 DIAGNOSIS — K2270 Barrett's esophagus without dysplasia: Secondary | ICD-10-CM

## 2014-12-04 DIAGNOSIS — Z8501 Personal history of malignant neoplasm of esophagus: Secondary | ICD-10-CM

## 2014-12-04 MED ORDER — ESOMEPRAZOLE MAGNESIUM 40 MG PO PACK: 40.0000 mg | PACK | Freq: Every day | ORAL | Status: DC

## 2014-12-04 MED ORDER — SODIUM CHLORIDE 0.9 % IV SOLN: 500.0000 mL | INTRAVENOUS | Status: DC

## 2014-12-04 NOTE — Op Note (Signed)
[facility]  [address] [place], [address]   ENDOSCOPY PROCEDURE REPORT  PATIENT: Willie, Owens  MR#: [id number] BIRTHDATE: June 27, 1951 , 63  yrs. old GENDER: male ENDOSCOPIST: Jerene Bears, MD REFERRED BY:  Burney Gauze, M.D. PROCEDURE DATE:  12/04/2014 PROCEDURE:  EGD w/ biopsy ASA CLASS:     Class III INDICATIONS:  follow-up of malignant esophageal adenocarcinoma, diagnosed July 2015. MEDICATIONS: Monitored anesthesia care and Propofol 150 mg IV TOPICAL ANESTHETIC: none  DESCRIPTION OF PROCEDURE: After the risks benefits and alternatives of the procedure were thoroughly explained, informed consent was obtained.  The LB [id number] endoscope was introduced through the mouth and advanced to the second portion of the duodenum , Without limitations.  The instrument was slowly withdrawn as the mucosa was fully examined.   ESOPHAGUS: No residual tumor/mass is evident in the distal esophagus. There is evidence of patchy Barrett's mucosa and a nonobstructing ring at the proximal end of a 4 cm hiatal hernia (38-42 cm from the incisors).   The distal esophagus was examined under narrow band imaging and biopsies were performed from multiple locations in the distal esophagus.  STOMACH: A 4 cm hiatal hernia was noted.   The mucosa of the stomach appeared normal.  DUODENUM: The duodenal mucosa showed no abnormalities in the bulb and 2nd part of the duodenum.  Retroflexed views revealed a hiatal hernia.     The scope was then withdrawn from the patient and the procedure completed.  COMPLICATIONS: There were no immediate complications.     ENDOSCOPIC IMPRESSION: 1.   No definite residual esophageal tumor/mass.  Patchy Barrett's mucosa.  Biopsies performed from the distal esophagus 2.   4 cm hiatal hernia 3.   The mucosa of the stomach appeared normal 4.   The duodenal mucosa showed no abnormalities in the bulb and 2nd part of the  duodenum  RECOMMENDATIONS: 1.  Await biopsy results 2.  Follow-up with Dr.  Marin Olp   eSigned:  Jerene Bears, MD 12/04/2014 9:27 AM    VV:ZSMOL Marin Olp, MD, The Patient, and Anastasia Pall, MD  PATIENT NAME:  Willie, Owens MR#: [id number]

## 2014-12-04 NOTE — Progress Notes (Signed)
A/ox3 pleased with MAC, report to Annette RN 

## 2014-12-04 NOTE — Patient Instructions (Signed)
YOU HAD AN ENDOSCOPIC PROCEDURE TODAY AT THE [facility]: Refer to the procedure report that was given to you for any specific questions about what was found during the examination.  If the procedure report does not answer your questions, please call your gastroenterologist to clarify.  If you requested that your care partner not be given the details of your procedure findings, then the procedure report has been included in a sealed envelope for you to review at your convenience later.  YOU SHOULD EXPECT: Some feelings of bloating in the abdomen. Passage of more gas than usual.  Walking can help get rid of the air that was put into your GI tract during the procedure and reduce the bloating. If you had a lower endoscopy (such as a colonoscopy or flexible sigmoidoscopy) you may notice spotting of blood in your stool or on the toilet paper. If you underwent a bowel prep for your procedure, then you may not have a normal bowel movement for a few days.  DIET: Your first meal following the procedure should be a light meal and then it is ok to progress to your normal diet.  A half-sandwich or bowl of soup is an example of a good first meal.  Heavy or fried foods are harder to digest and may make you feel nauseous or bloated.  Likewise meals heavy in dairy and vegetables can cause extra gas to form and this can also increase the bloating.  Drink plenty of fluids but you should avoid alcoholic beverages for 24 hours.  ACTIVITY: Your care partner should take you home directly after the procedure.  You should plan to take it easy, moving slowly for the rest of the day.  You can resume normal activity the day after the procedure however you should NOT DRIVE or use heavy machinery for 24 hours (because of the sedation medicines used during the test).    SYMPTOMS TO REPORT IMMEDIATELY: A gastroenterologist can be reached at any hour.  During normal business hours, 8:30 AM to 5:00 PM Monday through Friday,  call [phone].  After hours and on weekends, please call the GI answering service at [phone] who will take a message and have the physician on call contact you.    Following upper endoscopy (EGD)  Vomiting of blood or coffee ground material  New chest pain or pain under the shoulder blades  Painful or persistently difficult swallowing  New shortness of breath  Fever of 100F or higher  Black, tarry-looking stools  FOLLOW UP: If any biopsies were taken you will be contacted by phone or by letter within the next 1-3 weeks.  Call your gastroenterologist if you have not heard about the biopsies in 3 weeks.  Our staff will call the home number listed on your records the next business day following your procedure to check on you and address any questions or concerns that you may have at that time regarding the information given to you following your procedure. This is a courtesy call and so if there is no answer at the home number and we have not heard from you through the emergency physician on call, we will assume that you have returned to your regular daily activities without incident.  SIGNATURES/CONFIDENTIALITY: You and/or your care partner have signed paperwork which will be entered into your electronic medical record.  These signatures attest to the fact that that the information above on your After Visit Summary has been reviewed and is understood.  Full   responsibility of the confidentiality of this discharge information lies with you and/or your care-partner.    Handouts were given to your care partner on GERD, hiatal hernia and barrett's esophagus. You may resume your current medications today.  Nexium change to take every am.  Rx sent to [facility] in [place]. Await biopsy results. Please call if any questions or concerns.

## 2014-12-04 NOTE — Progress Notes (Signed)
Called to room to assist during endoscopic procedure.  Patient ID and intended procedure confirmed with present staff. Received instructions for my participation in the procedure from the performing physician.  

## 2014-12-04 NOTE — Progress Notes (Signed)
No problems noted in the recovery room. Willie Owens  Pt emotional in the recovery room.  Tears of joy that Dr. Hilarie Fredrickson did not see any recurrent tumor in esophagus.  Willie Owens

## 2014-12-05 ENCOUNTER — Telehealth: Payer: Self-pay | Admitting: Hematology & Oncology

## 2014-12-05 ENCOUNTER — Ambulatory Visit: Payer: 59

## 2014-12-05 ENCOUNTER — Other Ambulatory Visit: Payer: 59 | Admitting: Lab

## 2014-12-05 ENCOUNTER — Ambulatory Visit: Payer: 59 | Admitting: Family

## 2014-12-05 ENCOUNTER — Telehealth: Payer: Self-pay | Admitting: *Deleted

## 2014-12-05 LAB — CULTURE, BLOOD (ROUTINE X 2)
CULTURE: NO GROWTH
Culture: NO GROWTH

## 2014-12-05 NOTE — Telephone Encounter (Signed)
  Follow up Call-  Call back number 12/04/2014 07/17/2014  Post procedure Call Back phone  # [phone]  Permission to leave phone message Yes Yes     Patient questions:  Do you have a fever, pain , or abdominal swelling? No. Pain Score  0 *  Have you tolerated food without any problems? Yes.    Have you been able to return to your normal activities? Yes.    Do you have any questions about your discharge instructions: Diet   No. Medications  No. Follow up visit  No.  Do you have questions or concerns about your Care? No.  Actions: * If pain score is 4 or above: No action needed, pain <4.

## 2014-12-05 NOTE — Telephone Encounter (Signed)
UPDATE Case APPROVED: [id number] Valid: 10/03/2014 - 10/04/2015 Esophageal cancer - Primary ICD-9-150.9 ICD-10-C15.9      J9171 DOCEtaxel (TAXOTERE) 130 mg in dextrose 5 % 250 mL chemo infusion,   J9060 CISplatin (PLATINOL) 130 mg in sodium chloride 0.9 % 500 mL chemo infusion   NPA - [id number]     Efc: 09/26/2014 to current              [facility]- NPR  J2405 ondansetron (ZOFRAN) 8 MG tablet J1100 dexamethasone (DECADRON) 4 MG tablet J2060  LORazepam (ATIVAN) 0.5 MG tablet   J9178 epirubicin (ELLENCE) chemo injection 110 mg J9263 oxaliplatin (ELOXATIN) 285 mg in dextrose 5 % 500 mL chemo infusion J0780 prochlorperazine (COMPAZINE) 10 MG tablet J7030 J0610 J3480 J3475 J7050 J1453 J2469

## 2014-12-06 ENCOUNTER — Ambulatory Visit (HOSPITAL_BASED_OUTPATIENT_CLINIC_OR_DEPARTMENT_OTHER): Payer: 59

## 2014-12-06 ENCOUNTER — Other Ambulatory Visit (HOSPITAL_BASED_OUTPATIENT_CLINIC_OR_DEPARTMENT_OTHER): Payer: 59 | Admitting: Lab

## 2014-12-06 ENCOUNTER — Other Ambulatory Visit: Payer: 59 | Admitting: Lab

## 2014-12-06 ENCOUNTER — Ambulatory Visit (HOSPITAL_BASED_OUTPATIENT_CLINIC_OR_DEPARTMENT_OTHER): Payer: 59 | Admitting: Family

## 2014-12-06 DIAGNOSIS — C159 Malignant neoplasm of esophagus, unspecified: Secondary | ICD-10-CM

## 2014-12-06 DIAGNOSIS — C155 Malignant neoplasm of lower third of esophagus: Secondary | ICD-10-CM

## 2014-12-06 DIAGNOSIS — Z5111 Encounter for antineoplastic chemotherapy: Secondary | ICD-10-CM

## 2014-12-06 LAB — CMP (CANCER CENTER ONLY)
ALBUMIN: 3 g/dL — AB (ref 3.3–5.5)
ALT(SGPT): 19 U/L (ref 10–47)
AST: 20 U/L (ref 11–38)
Alkaline Phosphatase: 57 U/L (ref 26–84)
BUN, Bld: 12 mg/dL (ref 7–22)
CO2: 25 mEq/L (ref 18–33)
Calcium: 8.7 mg/dL (ref 8.0–10.3)
Chloride: 98 mEq/L (ref 98–108)
Creat: 1 mg/dl (ref 0.6–1.2)
GLUCOSE: 143 mg/dL — AB (ref 73–118)
POTASSIUM: 3.8 meq/L (ref 3.3–4.7)
SODIUM: 135 meq/L (ref 128–145)
TOTAL PROTEIN: 6.1 g/dL — AB (ref 6.4–8.1)
Total Bilirubin: 0.7 mg/dl (ref 0.20–1.60)

## 2014-12-06 LAB — CBC WITH DIFFERENTIAL (CANCER CENTER ONLY)
BASO#: 0.1 10*3/uL (ref 0.0–0.2)
BASO%: 0.5 % (ref 0.0–2.0)
EOS%: 0.6 % (ref 0.0–7.0)
Eosinophils Absolute: 0.1 10*3/uL (ref 0.0–0.5)
HCT: 34.8 % — ABNORMAL LOW (ref 38.7–49.9)
HEMOGLOBIN: 11.6 g/dL — AB (ref 13.0–17.1)
LYMPH#: 2 10*3/uL (ref 0.9–3.3)
LYMPH%: 20.6 % (ref 14.0–48.0)
MCH: 31.7 pg (ref 28.0–33.4)
MCHC: 33.3 g/dL (ref 32.0–35.9)
MCV: 95 fL (ref 82–98)
MONO#: 0.7 10*3/uL (ref 0.1–0.9)
MONO%: 7.4 % (ref 0.0–13.0)
NEUT%: 70.9 % (ref 40.0–80.0)
NEUTROS ABS: 6.9 10*3/uL — AB (ref 1.5–6.5)
Platelets: 220 10*3/uL (ref 145–400)
RBC: 3.66 10*6/uL — ABNORMAL LOW (ref 4.20–5.70)
RDW: 16.8 % — ABNORMAL HIGH (ref 11.1–15.7)
WBC: 9.7 10*3/uL (ref 4.0–10.0)

## 2014-12-06 LAB — TECHNOLOGIST REVIEW CHCC SATELLITE

## 2014-12-06 MED ORDER — IPRATROPIUM BROMIDE 0.02 % IN SOLN
RESPIRATORY_TRACT | Status: AC
  Filled 2014-12-06: qty 2.5

## 2014-12-06 MED ORDER — DEXAMETHASONE SODIUM PHOSPHATE 20 MG/5ML IJ SOLN
12.0000 mg | Freq: Once | INTRAMUSCULAR | Status: AC
  Administered 2014-12-06: 12 mg via INTRAVENOUS

## 2014-12-06 MED ORDER — PALONOSETRON HCL INJECTION 0.25 MG/5ML
0.2500 mg | Freq: Once | INTRAVENOUS | Status: AC
  Administered 2014-12-06: 0.25 mg via INTRAVENOUS

## 2014-12-06 MED ORDER — HEPARIN SOD (PORK) LOCK FLUSH 100 UNIT/ML IV SOLN
500.0000 [IU] | Freq: Once | INTRAVENOUS | Status: AC | PRN
  Administered 2014-12-06: 500 [IU]
  Filled 2014-12-06: qty 5

## 2014-12-06 MED ORDER — SODIUM CHLORIDE 0.9 % IV SOLN
60.0000 mg/m2 | Freq: Once | INTRAVENOUS | Status: AC
  Administered 2014-12-06: 130 mg via INTRAVENOUS
  Filled 2014-12-06: qty 130

## 2014-12-06 MED ORDER — DOCETAXEL CHEMO INJECTION 160 MG/16ML
60.0000 mg/m2 | Freq: Once | INTRAVENOUS | Status: AC
  Administered 2014-12-06: 130 mg via INTRAVENOUS
  Filled 2014-12-06: qty 13

## 2014-12-06 MED ORDER — PALONOSETRON HCL INJECTION 0.25 MG/5ML
INTRAVENOUS | Status: AC
  Filled 2014-12-06: qty 5

## 2014-12-06 MED ORDER — ALBUTEROL SULFATE (2.5 MG/3ML) 0.083% IN NEBU
2.5000 mg | INHALATION_SOLUTION | Freq: Once | RESPIRATORY_TRACT | Status: AC
  Administered 2014-12-06: 2.5 mg via RESPIRATORY_TRACT
  Filled 2014-12-06: qty 3

## 2014-12-06 MED ORDER — BENZONATATE 100 MG PO CAPS: 100.0000 mg | ORAL_CAPSULE | Freq: Three times a day (TID) | ORAL | Status: AC | PRN

## 2014-12-06 MED ORDER — POTASSIUM CHLORIDE 2 MEQ/ML IV SOLN
Freq: Once | INTRAVENOUS | Status: AC
  Administered 2014-12-06: 11:00:00 via INTRAVENOUS
  Filled 2014-12-06: qty 10

## 2014-12-06 MED ORDER — IPRATROPIUM BROMIDE 0.02 % IN SOLN
0.5000 mg | Freq: Once | RESPIRATORY_TRACT | Status: AC
  Administered 2014-12-06: 0.5 mg via RESPIRATORY_TRACT
  Filled 2014-12-06: qty 2.5

## 2014-12-06 MED ORDER — IPRATROPIUM-ALBUTEROL 0.5-2.5 (3) MG/3ML IN SOLN: 3.0000 mL | RESPIRATORY_TRACT | Status: DC

## 2014-12-06 MED ORDER — FOSAPREPITANT DIMEGLUMINE INJECTION 150 MG
150.0000 mg | Freq: Once | INTRAVENOUS | Status: AC
  Administered 2014-12-06: 150 mg via INTRAVENOUS
  Filled 2014-12-06: qty 5

## 2014-12-06 MED ORDER — IPRATROPIUM-ALBUTEROL 0.5-2.5 (3) MG/3ML IN SOLN: 3.0000 mL | Freq: Once | RESPIRATORY_TRACT | Status: DC

## 2014-12-06 MED ORDER — SODIUM CHLORIDE 0.9 % IJ SOLN
10.0000 mL | INTRAMUSCULAR | Status: DC | PRN
  Administered 2014-12-06: 10 mL
  Filled 2014-12-06: qty 10

## 2014-12-06 MED ORDER — DEXAMETHASONE SODIUM PHOSPHATE 20 MG/5ML IJ SOLN
INTRAMUSCULAR | Status: AC
  Filled 2014-12-06: qty 5

## 2014-12-06 NOTE — Patient Instructions (Signed)
Docetaxel injection What is this medicine? DOCETAXEL (doe se TAX el) is a chemotherapy drug. It targets fast dividing cells, like cancer cells, and causes these cells to die. This medicine is used to treat many types of cancers like breast cancer, certain stomach cancers, head and neck cancer, lung cancer, and prostate cancer. This medicine may be used for other purposes; ask your health care provider or pharmacist if you have questions. COMMON BRAND NAME(S): Docefrez, Taxotere What should I tell my health care provider before I take this medicine? They need to know if you have any of these conditions: -infection (especially a virus infection such as chickenpox, cold sores, or herpes) -liver disease -low blood counts, like low white cell, platelet, or red cell counts -an unusual or allergic reaction to docetaxel, polysorbate 80, other chemotherapy agents, other medicines, foods, dyes, or preservatives -pregnant or trying to get pregnant -breast-feeding How should I use this medicine? This drug is given as an infusion into a vein. It is administered in a hospital or clinic by a specially trained health care professional. Talk to your pediatrician regarding the use of this medicine in children. Special care may be needed. Overdosage: If you think you have taken too much of this medicine contact a poison control center or emergency room at once. NOTE: This medicine is only for you. Do not share this medicine with others. What if I miss a dose? It is important not to miss your dose. Call your doctor or health care professional if you are unable to keep an appointment. What may interact with this medicine? -cyclosporine -erythromycin -ketoconazole -medicines to increase blood counts like filgrastim, pegfilgrastim, sargramostim -vaccines Talk to your doctor or health care professional before taking any of these medicines: -acetaminophen -aspirin -ibuprofen -ketoprofen -naproxen This list  may not describe all possible interactions. Give your health care provider a list of all the medicines, herbs, non-prescription drugs, or dietary supplements you use. Also tell them if you smoke, drink alcohol, or use illegal drugs. Some items may interact with your medicine. What should I watch for while using this medicine? Your condition will be monitored carefully while you are receiving this medicine. You will need important blood work done while you are taking this medicine. This drug may make you feel generally unwell. This is not uncommon, as chemotherapy can affect healthy cells as well as cancer cells. Report any side effects. Continue your course of treatment even though you feel ill unless your doctor tells you to stop. In some cases, you may be given additional medicines to help with side effects. Follow all directions for their use. Call your doctor or health care professional for advice if you get a fever, chills or sore throat, or other symptoms of a cold or flu. Do not treat yourself. This drug decreases your body's ability to fight infections. Try to avoid being around people who are sick. This medicine may increase your risk to bruise or bleed. Call your doctor or health care professional if you notice any unusual bleeding. Be careful brushing and flossing your teeth or using a toothpick because you may get an infection or bleed more easily. If you have any dental work done, tell your dentist you are receiving this medicine. Avoid taking products that contain aspirin, acetaminophen, ibuprofen, naproxen, or ketoprofen unless instructed by your doctor. These medicines may hide a fever. This medicine contains an alcohol in the product. You may get drowsy or dizzy. Do not drive, use machinery, or do anything  that needs mental alertness until you know how this medicine affects you. Do not stand or sit up quickly, especially if you are an older patient. This reduces the risk of dizzy or  fainting spells. Avoid alcoholic drinks Do not become pregnant while taking this medicine. Women should inform their doctor if they wish to become pregnant or think they might be pregnant. There is a potential for serious side effects to an unborn child. Talk to your health care professional or pharmacist for more information. Do not breast-feed an infant while taking this medicine. What side effects may I notice from receiving this medicine? Side effects that you should report to your doctor or health care professional as soon as possible: -allergic reactions like skin rash, itching or hives, swelling of the face, lips, or tongue -low blood counts - This drug may decrease the number of white blood cells, red blood cells and platelets. You may be at increased risk for infections and bleeding. -signs of infection - fever or chills, cough, sore throat, pain or difficulty passing urine -signs of decreased platelets or bleeding - bruising, pinpoint red spots on the skin, black, tarry stools, nosebleeds -signs of decreased red blood cells - unusually weak or tired, fainting spells, lightheadedness -breathing problems -fast or irregular heartbeat -low blood pressure -mouth sores -nausea and vomiting -pain, swelling, redness or irritation at the injection site -pain, tingling, numbness in the hands or feet -swelling of the ankle, feet, hands -weight gain Side effects that usually do not require medical attention (report to your prescriber or health care professional if they continue or are bothersome): -bone pain -complete hair loss including hair on your head, underarms, pubic hair, eyebrows, and eyelashes -diarrhea -excessive tearing -changes in the color of fingernails -loosening of the fingernails -nausea -muscle pain -red flush to skin -sweating -weak or tired This list may not describe all possible side effects. Call your doctor for medical advice about side effects. You may report side  effects to FDA at 1-800-FDA-1088. Where should I keep my medicine? This drug is given in a hospital or clinic and will not be stored at home. NOTE: This sheet is a summary. It may not cover all possible information. If you have questions about this medicine, talk to your doctor, pharmacist, or health care provider.  2015, Elsevier/Gold Standard. (2013-11-08 22:21:02) Cisplatin injection What is this medicine? CISPLATIN (SIS pla tin) is a chemotherapy drug. It targets fast dividing cells, like cancer cells, and causes these cells to die. This medicine is used to treat many types of cancer like bladder, ovarian, and testicular cancers. This medicine may be used for other purposes; ask your health care provider or pharmacist if you have questions. COMMON BRAND NAME(S): Platinol, Platinol -AQ What should I tell my health care provider before I take this medicine? They need to know if you have any of these conditions: -blood disorders -hearing problems -kidney disease -recent or ongoing radiation therapy -an unusual or allergic reaction to cisplatin, carboplatin, other chemotherapy, other medicines, foods, dyes, or preservatives -pregnant or trying to get pregnant -breast-feeding How should I use this medicine? This drug is given as an infusion into a vein. It is administered in a hospital or clinic by a specially trained health care professional. Talk to your pediatrician regarding the use of this medicine in children. Special care may be needed. Overdosage: If you think you have taken too much of this medicine contact a poison control center or emergency room at once. NOTE: This  medicine is only for you. Do not share this medicine with others. What if I miss a dose? It is important not to miss a dose. Call your doctor or health care professional if you are unable to keep an appointment. What may interact with this medicine? -dofetilide -foscarnet -medicines for seizures -medicines to  increase blood counts like filgrastim, pegfilgrastim, sargramostim -probenecid -pyridoxine used with altretamine -rituximab -some antibiotics like amikacin, gentamicin, neomycin, polymyxin B, streptomycin, tobramycin -sulfinpyrazone -vaccines -zalcitabine Talk to your doctor or health care professional before taking any of these medicines: -acetaminophen -aspirin -ibuprofen -ketoprofen -naproxen This list may not describe all possible interactions. Give your health care provider a list of all the medicines, herbs, non-prescription drugs, or dietary supplements you use. Also tell them if you smoke, drink alcohol, or use illegal drugs. Some items may interact with your medicine. What should I watch for while using this medicine? Your condition will be monitored carefully while you are receiving this medicine. You will need important blood work done while you are taking this medicine. This drug may make you feel generally unwell. This is not uncommon, as chemotherapy can affect healthy cells as well as cancer cells. Report any side effects. Continue your course of treatment even though you feel ill unless your doctor tells you to stop. In some cases, you may be given additional medicines to help with side effects. Follow all directions for their use. Call your doctor or health care professional for advice if you get a fever, chills or sore throat, or other symptoms of a cold or flu. Do not treat yourself. This drug decreases your body's ability to fight infections. Try to avoid being around people who are sick. This medicine may increase your risk to bruise or bleed. Call your doctor or health care professional if you notice any unusual bleeding. Be careful brushing and flossing your teeth or using a toothpick because you may get an infection or bleed more easily. If you have any dental work done, tell your dentist you are receiving this medicine. Avoid taking products that contain aspirin,  acetaminophen, ibuprofen, naproxen, or ketoprofen unless instructed by your doctor. These medicines may hide a fever. Do not become pregnant while taking this medicine. Women should inform their doctor if they wish to become pregnant or think they might be pregnant. There is a potential for serious side effects to an unborn child. Talk to your health care professional or pharmacist for more information. Do not breast-feed an infant while taking this medicine. Drink fluids as directed while you are taking this medicine. This will help protect your kidneys. Call your doctor or health care professional if you get diarrhea. Do not treat yourself. What side effects may I notice from receiving this medicine? Side effects that you should report to your doctor or health care professional as soon as possible: -allergic reactions like skin rash, itching or hives, swelling of the face, lips, or tongue -signs of infection - fever or chills, cough, sore throat, pain or difficulty passing urine -signs of decreased platelets or bleeding - bruising, pinpoint red spots on the skin, black, tarry stools, nosebleeds -signs of decreased red blood cells - unusually weak or tired, fainting spells, lightheadedness -breathing problems -changes in hearing -gout pain -low blood counts - This drug may decrease the number of white blood cells, red blood cells and platelets. You may be at increased risk for infections and bleeding. -nausea and vomiting -pain, swelling, redness or irritation at the injection site -pain,  tingling, numbness in the hands or feet -problems with balance, movement -trouble passing urine or change in the amount of urine Side effects that usually do not require medical attention (report to your doctor or health care professional if they continue or are bothersome): -changes in vision -loss of appetite -metallic taste in the mouth or changes in taste This list may not describe all possible side  effects. Call your doctor for medical advice about side effects. You may report side effects to FDA at 1-800-FDA-1088. Where should I keep my medicine? This drug is given in a hospital or clinic and will not be stored at home. NOTE: This sheet is a summary. It may not cover all possible information. If you have questions about this medicine, talk to your doctor, pharmacist, or health care provider.  2015, Elsevier/Gold Standard. (2008-03-19 14:40:54)

## 2014-12-06 NOTE — Progress Notes (Signed)
[place]  Telephone:(336) [phone] Fax:(336) [phone]  ID: Willie Owens OB: 1951/06/09 MR#: [id number] Patient Care Team: Chesley Noon, MD as PCP - General (Family Medicine)  DIAGNOSIS: Metastatic adenocarcinoma of the distal esophagus  INTERVAL HISTORY: Willie Owens is here today for follow-up and treatment. He has a cough and was nauseated yesterday. His wife states that he is having problems with his allergies.   His memory is still a little fuzzy at times but continues to improve.  He denies fever, chills, vomiting, rash, headache, dizziness, SOB, chest pain, palpitations, abdominal pain, constipation, diarrhea, blood in urine or stool.  No swelling, tenderness, numbness or tingling in his extremities.  His appetite is better but he is still not drinking a lot of fluids. His weight is down 3 lbs this week.   CURRENT TREATMENT: Cisplatin/Taxotere  REVIEW OF SYSTEMS: All other 10 point review of systems is negative except for those issues mentioned above.    PAST MEDICAL HISTORY: Past Medical History  Diagnosis Date  . Atrial fibrillation     with RVR while hospitalized in 2014  . Hyperthyroidism     thyrotoxicosis , graves  . H/O [facility] spotted fever   . Gout   . History of antinuclear antibodies   . GERD (gastroesophageal reflux disease)   . Cancer 06/2014    esopageal ca-7/15  . Esophageal cancer 07/26/2014  . Maintenance chemotherapy     every 3 weeks/last chemo  was on 11/14/14  . Portacath in place     right side upper chest   PAST SURGICAL HISTORY: Past Surgical History  Procedure Laterality Date  . Leg surgery      Fx, rod removed/right leg  . Transthoracic echocardiogram  4/292014    EF 55-60%; severe bi-atrial enlargement; RV mod dilated  . Cardioversion N/A 09/21/2013    Procedure: CARDIOVERSION;  Surgeon: Pixie Casino, MD;  Location: [facility] ENDOSCOPY;  Service: Cardiovascular;  Laterality: N/A;  . Tonsillectomy  and adenoidectomy    . Porta cath      right side chest   FAMILY HISTORY Family History  Problem Relation Age of Onset  . Pulmonary embolism Neg Hx   . CAD Neg Hx   . Colon cancer Neg Hx   . Stomach cancer Neg Hx   . Esophageal cancer Neg Hx   . Diabetes Mother    SOCIAL HISTORY:  History   Social History  . Marital Status: Married    Spouse Name: Willie Owens    Number of Children: 3  . Years of Education: N/A   Occupational History  . sales    Social History Main Topics  . Smoking status: Former Smoker -- 2.00 packs/day for 34 years    Types: Cigarettes    Start date: 03/19/1971    Quit date: 12/30/2004  . Smokeless tobacco: Never Used     Comment: quit smoking 9 years ago  . Alcohol Use: No     Comment: vodka daily  . Drug Use: No  . Sexual Activity: Not on file   Other Topics Concern  . Not on file   Social History Narrative   ADVANCED DIRECTIVES: <no information>  HEALTH MAINTENANCE: History  Substance Use Topics  . Smoking status: Former Smoker -- 2.00 packs/day for 34 years    Types: Cigarettes    Start date: 03/19/1971    Quit date: 12/30/2004  . Smokeless tobacco: Never Used     Comment: quit smoking 9 years ago  .  Alcohol Use: No     Comment: vodka daily   Colonoscopy: PAP: Bone density: Lipid panel:  Allergies  Allergen Reactions  . Penicillins Other (See Comments)    Hallucinations, from childhood   Current Outpatient Prescriptions  Medication Sig Dispense Refill  . B Complex-C (SUPER B COMPLEX PO) Take 1 tablet by mouth daily.    . Cholecalciferol (VITAMIN D) 2000 UNITS tablet Take 2,000 Units by mouth daily.    . clonazePAM (KLONOPIN) 0.5 MG tablet Take 1 tablet (0.5 mg total) by mouth 3 (three) times daily as needed (anxiety). (Patient not taking: Reported on 12/06/2014) 30 tablet 0  . dexamethasone (DECADRON) 4 MG tablet Take 8 mg by mouth 2 (two) times daily with a meal. Start the day after chemo for three days    .  diphenoxylate-atropine (LOMOTIL) 2.5-0.025 MG per tablet Take 1 tablet by mouth 4 (four) times daily as needed for diarrhea or loose stools. (Patient not taking: Reported on 12/06/2014) 30 tablet 2  . esomeprazole (NEXIUM) 40 MG capsule Take 40 mg by mouth 2 (two) times daily before a meal.    . esomeprazole (NEXIUM) 40 MG packet Take 40 mg by mouth daily before breakfast. (Patient not taking: Reported on 12/06/2014) 90 each 3  . levofloxacin (LEVAQUIN) 500 MG tablet Take 1 tablet (500 mg total) by mouth daily. 5 tablet 0  . lidocaine-prilocaine (EMLA) cream Apply 1 application topically as needed. 30 g 10  . Multiple Vitamins-Minerals (MULTIVITAMIN GUMMIES ADULT PO) Take by mouth every morning.    . Probiotic Product (ALIGN) 4 MG CAPS Take by mouth every morning.    . prochlorperazine (COMPAZINE) 10 MG tablet Take 10 mg by mouth as needed for nausea or vomiting.     . propranolol ER (INDERAL LA) 80 MG 24 hr capsule Take 80 mg by mouth at bedtime.    . traZODone (DESYREL) 50 MG tablet Take 1 tablet (50 mg total) by mouth at bedtime. (Patient not taking: Reported on 12/04/2014) 30 tablet 3   No current facility-administered medications for this visit.   OBJECTIVE: There were no vitals filed for this visit. There is no weight on file to calculate BMI. ECOG FS:1 - Symptomatic but completely ambulatory Ocular: Sclerae unicteric, pupils equal, round and reactive to light Ear-nose-throat: Oropharynx clear, dentition fair Lymphatic: No cervical or supraclavicular adenopathy Lungs no rales or rhonchi, good excursion bilaterally Heart regular rate and rhythm, no murmur appreciated Abd soft, nontender, positive bowel sounds MSK no focal spinal tenderness, no joint edema Neuro: non-focal, well-oriented, appropriate affect  LAB RESULTS: CMP     Component Value Date/Time   NA 135* 12/01/2014 0600   NA 134 11/28/2014 1239   K 4.2 12/01/2014 0600   K 3.2* 11/28/2014 1239   CL 99 12/01/2014 0600   CL  94* 11/28/2014 1239   CO2 24 12/01/2014 0600   CO2 29 11/28/2014 1239   GLUCOSE 88 12/01/2014 0600   GLUCOSE 105 11/28/2014 1239   BUN 8 12/01/2014 0600   BUN 16 11/28/2014 1239   CREATININE 0.83 12/01/2014 0600   CREATININE 1.2 11/28/2014 1239   CALCIUM 8.4 12/01/2014 0600   CALCIUM 8.3 11/28/2014 1239   PROT 5.2* 12/01/2014 0600   PROT 6.2* 11/28/2014 1239   ALBUMIN 2.8* 12/01/2014 0600   AST 14 12/01/2014 0600   AST 22 11/28/2014 1239   ALT 14 12/01/2014 0600   ALT 26 11/28/2014 1239   ALKPHOS 56 12/01/2014 0600   ALKPHOS 70 11/28/2014 1239  BILITOT 0.5 12/01/2014 0600   BILITOT 0.90 11/28/2014 1239   GFRNONAA >90 12/01/2014 0600   GFRAA >90 12/01/2014 0600   No results found for: SPEP Lab Results  Component Value Date   WBC 9.7 12/06/2014   NEUTROABS 6.9* 12/06/2014   HGB 11.6* 12/06/2014   HCT 34.8* 12/06/2014   MCV 95 12/06/2014   PLT 220 12/06/2014   No results found for: LABCA2 No components found for: SLHTD428 No results for input(s): INR in the last 168 hours.  STUDIES: None  ASSESSMENT/PLAN: Mr. Fearnow is a 63 year old gentleman with stage IV adenocarcinoma of the distal esophagus. We had him on chemotherapy with EOX but he had a hard time with this. He is now oncisplatin/Taxotere.  We will proceed with Cycle 4 of treatment today as planned.  His CBC and CMP today are ok.  I gave him a prescription for Tessalon Perles to help with his cough. His wife will also start him on Claritin daily.  We will also give him a breathing treatment today.  He will come in once weekly for fluids.  We will get him a new treatment schedule today. He knows to call here with any questions or concerns and to go to the ED in the event of an emergency. We can certainly see him sooner if need be.  Eliezer Bottom, NP 12/06/2014 10:14 AM

## 2014-12-07 ENCOUNTER — Ambulatory Visit (HOSPITAL_BASED_OUTPATIENT_CLINIC_OR_DEPARTMENT_OTHER): Payer: 59

## 2014-12-07 DIAGNOSIS — Z5189 Encounter for other specified aftercare: Secondary | ICD-10-CM

## 2014-12-07 DIAGNOSIS — C155 Malignant neoplasm of lower third of esophagus: Secondary | ICD-10-CM

## 2014-12-07 MED ORDER — PEGFILGRASTIM INJECTION 6 MG/0.6ML ~~LOC~~
6.0000 mg | PREFILLED_SYRINGE | Freq: Once | SUBCUTANEOUS | Status: AC
  Administered 2014-12-07: 6 mg via SUBCUTANEOUS

## 2014-12-09 ENCOUNTER — Encounter: Payer: Self-pay | Admitting: Internal Medicine

## 2014-12-11 ENCOUNTER — Ambulatory Visit (HOSPITAL_BASED_OUTPATIENT_CLINIC_OR_DEPARTMENT_OTHER): Payer: 59

## 2014-12-11 VITALS — BP 124/68 | HR 74 | Temp 98.1°F | Resp 18

## 2014-12-11 DIAGNOSIS — C159 Malignant neoplasm of esophagus, unspecified: Secondary | ICD-10-CM

## 2014-12-11 DIAGNOSIS — C155 Malignant neoplasm of lower third of esophagus: Secondary | ICD-10-CM

## 2014-12-11 MED ORDER — SODIUM CHLORIDE 0.9 % IV SOLN
INTRAVENOUS | Status: DC
  Administered 2014-12-11: 12:00:00 via INTRAVENOUS

## 2014-12-11 NOTE — Patient Instructions (Signed)
Dehydration, Adult Dehydration is when you lose more fluids from the body than you take in. Vital organs like the kidneys, brain, and heart cannot function without a proper amount of fluids and salt. Any loss of fluids from the body can cause dehydration.  CAUSES   Vomiting.  Diarrhea.  Excessive sweating.  Excessive urine output.  Fever. SYMPTOMS  Mild dehydration  Thirst.  Dry lips.  Slightly dry mouth. Moderate dehydration  Very dry mouth.  Sunken eyes.  Skin does not bounce back quickly when lightly pinched and released.  Dark urine and decreased urine production.  Decreased tear production.  Headache. Severe dehydration  Very dry mouth.  Extreme thirst.  Rapid, weak pulse (more than 100 beats per minute at rest).  Cold hands and feet.  Not able to sweat in spite of heat and temperature.  Rapid breathing.  Blue lips.  Confusion and lethargy.  Difficulty being awakened.  Minimal urine production.  No tears. DIAGNOSIS  Your caregiver will diagnose dehydration based on your symptoms and your exam. Blood and urine tests will help confirm the diagnosis. The diagnostic evaluation should also identify the cause of dehydration. TREATMENT  Treatment of mild or moderate dehydration can often be done at home by increasing the amount of fluids that you drink. It is best to drink small amounts of fluid more often. Drinking too much at one time can make vomiting worse. Refer to the home care instructions below. Severe dehydration needs to be treated at the hospital where you will probably be given intravenous (IV) fluids that contain water and electrolytes. HOME CARE INSTRUCTIONS   Ask your caregiver about specific rehydration instructions.  Drink enough fluids to keep your urine clear or pale yellow.  Drink small amounts frequently if you have nausea and vomiting.  Eat as you normally do.  Avoid:  Foods or drinks high in sugar.  Carbonated  drinks.  Juice.  Extremely hot or cold fluids.  Drinks with caffeine.  Fatty, greasy foods.  Alcohol.  Tobacco.  Overeating.  Gelatin desserts.  Wash your hands well to avoid spreading bacteria and viruses.  Only take over-the-counter or prescription medicines for pain, discomfort, or fever as directed by your caregiver.  Ask your caregiver if you should continue all prescribed and over-the-counter medicines.  Keep all follow-up appointments with your caregiver. SEEK MEDICAL CARE IF:  You have abdominal pain and it increases or stays in one area (localizes).  You have a rash, stiff neck, or severe headache.  You are irritable, sleepy, or difficult to awaken.  You are weak, dizzy, or extremely thirsty. SEEK IMMEDIATE MEDICAL CARE IF:   You are unable to keep fluids down or you get worse despite treatment.  You have frequent episodes of vomiting or diarrhea.  You have blood or green matter (bile) in your vomit.  You have blood in your stool or your stool looks black and tarry.  You have not urinated in 6 to 8 hours, or you have only urinated a small amount of very dark urine.  You have a fever.  You faint. MAKE SURE YOU:   Understand these instructions.  Will watch your condition.  Will get help right away if you are not doing well or get worse. Document Released: 12/13/2005 Document Revised: 03/06/2012 Document Reviewed: 08/02/2011 ExitCare Patient Information 2015 [employer]. This information is not intended to replace advice given to you by your health care provider. Make sure you discuss any questions you have with your health care   provider.  

## 2014-12-13 ENCOUNTER — Encounter: Payer: Self-pay | Admitting: Hematology & Oncology

## 2014-12-16 ENCOUNTER — Encounter: Payer: Self-pay | Admitting: *Deleted

## 2014-12-16 NOTE — Progress Notes (Signed)
Most recent office note faxed per the instructions below:  ----- Message -----    From: [name]    Sent: 12/13/2014  3:53 PM EST      To: [name], MD Subject: Non-Urgent Medical Question  We are working on getting Clif on disability.  They said that if his doctor could fax something stating he has stage 4 cancer; they could spend on the process.  Thanks for any help you can offer.  Fax [phone] Attention:  Ms. Pablo Ledger

## 2014-12-17 ENCOUNTER — Telehealth: Payer: Self-pay | Admitting: Hematology & Oncology

## 2014-12-17 NOTE — Telephone Encounter (Signed)
Faxed medical records today to:  [facility] F: [phone] P: [phone] [id number]   CASE: [id number]       COPY SCANNED

## 2014-12-18 ENCOUNTER — Ambulatory Visit (HOSPITAL_BASED_OUTPATIENT_CLINIC_OR_DEPARTMENT_OTHER): Payer: 59 | Admitting: Lab

## 2014-12-18 ENCOUNTER — Other Ambulatory Visit: Payer: Self-pay | Admitting: *Deleted

## 2014-12-18 ENCOUNTER — Ambulatory Visit (HOSPITAL_BASED_OUTPATIENT_CLINIC_OR_DEPARTMENT_OTHER): Payer: 59

## 2014-12-18 VITALS — BP 112/61 | HR 78 | Temp 97.6°F | Resp 16 | Wt 186.0 lb

## 2014-12-18 DIAGNOSIS — C155 Malignant neoplasm of lower third of esophagus: Secondary | ICD-10-CM

## 2014-12-18 DIAGNOSIS — Z5189 Encounter for other specified aftercare: Secondary | ICD-10-CM

## 2014-12-18 DIAGNOSIS — C159 Malignant neoplasm of esophagus, unspecified: Secondary | ICD-10-CM

## 2014-12-18 LAB — CMP (CANCER CENTER ONLY)
ALBUMIN: 3.2 g/dL — AB (ref 3.3–5.5)
ALT: 10 U/L (ref 10–47)
AST: 22 U/L (ref 11–38)
Alkaline Phosphatase: 74 U/L (ref 26–84)
BUN, Bld: 12 mg/dL (ref 7–22)
CO2: 27 meq/L (ref 18–33)
Calcium: 8.4 mg/dL (ref 8.0–10.3)
Chloride: 103 mEq/L (ref 98–108)
Creat: 1.1 mg/dl (ref 0.6–1.2)
GLUCOSE: 90 mg/dL (ref 73–118)
POTASSIUM: 4.5 meq/L (ref 3.3–4.7)
SODIUM: 137 meq/L (ref 128–145)
TOTAL PROTEIN: 5.7 g/dL — AB (ref 6.4–8.1)
Total Bilirubin: 0.6 mg/dl (ref 0.20–1.60)

## 2014-12-18 LAB — CBC WITH DIFFERENTIAL (CANCER CENTER ONLY)
BASO#: 0.1 10*3/uL (ref 0.0–0.2)
BASO%: 0.3 % (ref 0.0–2.0)
EOS ABS: 0 10*3/uL (ref 0.0–0.5)
EOS%: 0 % (ref 0.0–7.0)
HCT: 31.1 % — ABNORMAL LOW (ref 38.7–49.9)
HGB: 10.3 g/dL — ABNORMAL LOW (ref 13.0–17.1)
LYMPH#: 2 10*3/uL (ref 0.9–3.3)
LYMPH%: 10.4 % — ABNORMAL LOW (ref 14.0–48.0)
MCH: 31.9 pg (ref 28.0–33.4)
MCHC: 33.1 g/dL (ref 32.0–35.9)
MCV: 96 fL (ref 82–98)
MONO#: 1.3 10*3/uL — AB (ref 0.1–0.9)
MONO%: 6.7 % (ref 0.0–13.0)
NEUT#: 16.1 10*3/uL — ABNORMAL HIGH (ref 1.5–6.5)
NEUT%: 82.6 % — ABNORMAL HIGH (ref 40.0–80.0)
PLATELETS: 224 10*3/uL (ref 145–400)
RBC: 3.23 10*6/uL — AB (ref 4.20–5.70)
RDW: 16.6 % — ABNORMAL HIGH (ref 11.1–15.7)
WBC: 19.5 10*3/uL — ABNORMAL HIGH (ref 4.0–10.0)

## 2014-12-18 MED ORDER — HEPARIN SOD (PORK) LOCK FLUSH 100 UNIT/ML IV SOLN
500.0000 [IU] | Freq: Once | INTRAVENOUS | Status: AC
  Administered 2014-12-18: 500 [IU] via INTRAVENOUS
  Filled 2014-12-18: qty 5

## 2014-12-18 MED ORDER — SODIUM CHLORIDE 0.9 % IV SOLN
1000.0000 mL | INTRAVENOUS | Status: DC
  Administered 2014-12-18: 11:00:00 via INTRAVENOUS

## 2014-12-18 MED ORDER — VENLAFAXINE HCL 37.5 MG PO TABS: 37.5000 mg | ORAL_TABLET | Freq: Two times a day (BID) | ORAL | Status: DC

## 2014-12-18 MED ORDER — SODIUM CHLORIDE 0.9 % IJ SOLN
10.0000 mL | Freq: Once | INTRAMUSCULAR | Status: AC
  Administered 2014-12-18: 10 mL via INTRAVENOUS
  Filled 2014-12-18: qty 10

## 2014-12-18 NOTE — Patient Instructions (Signed)
Dehydration, Adult Dehydration is when you lose more fluids from the body than you take in. Vital organs like the kidneys, brain, and heart cannot function without a proper amount of fluids and salt. Any loss of fluids from the body can cause dehydration.  CAUSES   Vomiting.  Diarrhea.  Excessive sweating.  Excessive urine output.  Fever. SYMPTOMS  Mild dehydration  Thirst.  Dry lips.  Slightly dry mouth. Moderate dehydration  Very dry mouth.  Sunken eyes.  Skin does not bounce back quickly when lightly pinched and released.  Dark urine and decreased urine production.  Decreased tear production.  Headache. Severe dehydration  Very dry mouth.  Extreme thirst.  Rapid, weak pulse (more than 100 beats per minute at rest).  Cold hands and feet.  Not able to sweat in spite of heat and temperature.  Rapid breathing.  Blue lips.  Confusion and lethargy.  Difficulty being awakened.  Minimal urine production.  No tears. DIAGNOSIS  Your caregiver will diagnose dehydration based on your symptoms and your exam. Blood and urine tests will help confirm the diagnosis. The diagnostic evaluation should also identify the cause of dehydration. TREATMENT  Treatment of mild or moderate dehydration can often be done at home by increasing the amount of fluids that you drink. It is best to drink small amounts of fluid more often. Drinking too much at one time can make vomiting worse. Refer to the home care instructions below. Severe dehydration needs to be treated at the hospital where you will probably be given intravenous (IV) fluids that contain water and electrolytes. HOME CARE INSTRUCTIONS   Ask your caregiver about specific rehydration instructions.  Drink enough fluids to keep your urine clear or pale yellow.  Drink small amounts frequently if you have nausea and vomiting.  Eat as you normally do.  Avoid:  Foods or drinks high in sugar.  Carbonated  drinks.  Juice.  Extremely hot or cold fluids.  Drinks with caffeine.  Fatty, greasy foods.  Alcohol.  Tobacco.  Overeating.  Gelatin desserts.  Wash your hands well to avoid spreading bacteria and viruses.  Only take over-the-counter or prescription medicines for pain, discomfort, or fever as directed by your caregiver.  Ask your caregiver if you should continue all prescribed and over-the-counter medicines.  Keep all follow-up appointments with your caregiver. SEEK MEDICAL CARE IF:  You have abdominal pain and it increases or stays in one area (localizes).  You have a rash, stiff neck, or severe headache.  You are irritable, sleepy, or difficult to awaken.  You are weak, dizzy, or extremely thirsty. SEEK IMMEDIATE MEDICAL CARE IF:   You are unable to keep fluids down or you get worse despite treatment.  You have frequent episodes of vomiting or diarrhea.  You have blood or green matter (bile) in your vomit.  You have blood in your stool or your stool looks black and tarry.  You have not urinated in 6 to 8 hours, or you have only urinated a small amount of very dark urine.  You have a fever.  You faint. MAKE SURE YOU:   Understand these instructions.  Will watch your condition.  Will get help right away if you are not doing well or get worse. Document Released: 12/13/2005 Document Revised: 03/06/2012 Document Reviewed: 08/02/2011 ExitCare Patient Information 2015 [employer]. This information is not intended to replace advice given to you by your health care provider. Make sure you discuss any questions you have with your health care   provider.  

## 2014-12-25 ENCOUNTER — Other Ambulatory Visit: Payer: 59 | Admitting: Lab

## 2014-12-25 ENCOUNTER — Ambulatory Visit (HOSPITAL_BASED_OUTPATIENT_CLINIC_OR_DEPARTMENT_OTHER): Payer: Self-pay | Admitting: Hematology & Oncology

## 2014-12-25 ENCOUNTER — Ambulatory Visit: Payer: 59 | Admitting: Family

## 2014-12-25 ENCOUNTER — Ambulatory Visit: Payer: 59

## 2014-12-25 DIAGNOSIS — C155 Malignant neoplasm of lower third of esophagus: Secondary | ICD-10-CM

## 2014-12-25 DIAGNOSIS — C159 Malignant neoplasm of esophagus, unspecified: Secondary | ICD-10-CM

## 2014-12-25 NOTE — Progress Notes (Signed)
Hematology and Oncology Follow Up Visit  Willie Owens [id number] 02/22/1951 63 y.o. 12/25/2014   Principle Diagnosis:  Metastatic adenocarcinoma of the distal esophagus  Current Therapy:    Status post 3 cycles of cis-platinum/Taxotere     Interim History:  Mr.  Willie Owens wife came in by herself today. He is at home.  She just wanted to talk to me about what was going on with him. He's been hospitalized. He's had this neurological issue with respect amnesia and total disorientation. His been unclear as to why he has had this. He is had a very extensive workup.  To me, it seems as if he has had a stroke. We have not found this on our scans.  He is not eating much. He is on Marinol. This may be helping.  He rests quite a bit.  I don't know if there is element of depression.   She really does not want him to take any more chemotherapy right now. I can understand this.  Medications: Current outpatient prescriptions: B Complex-C (SUPER B COMPLEX PO), Take 1 tablet by mouth daily., Disp: , Rfl: ;  benzonatate (TESSALON PERLES) 100 MG capsule, Take 1 capsule (100 mg total) by mouth 3 (three) times daily as needed for cough., Disp: 90 capsule, Rfl: 0;  Cholecalciferol (VITAMIN D) 2000 UNITS tablet, Take 2,000 Units by mouth daily., Disp: , Rfl:  clonazePAM (KLONOPIN) 0.5 MG tablet, Take 1 tablet (0.5 mg total) by mouth 3 (three) times daily as needed (anxiety). (Patient not taking: Reported on 12/06/2014), Disp: 30 tablet, Rfl: 0;  dexamethasone (DECADRON) 4 MG tablet, Take 8 mg by mouth 2 (two) times daily with a meal. Start the day after chemo for three days, Disp: , Rfl:  diphenoxylate-atropine (LOMOTIL) 2.5-0.025 MG per tablet, Take 1 tablet by mouth 4 (four) times daily as needed for diarrhea or loose stools. (Patient not taking: Reported on 12/06/2014), Disp: 30 tablet, Rfl: 2;  esomeprazole (NEXIUM) 40 MG capsule, Take 40 mg by mouth 2 (two) times daily before a meal., Disp: , Rfl:   esomeprazole (NEXIUM) 40 MG packet, Take 40 mg by mouth daily before breakfast. (Patient not taking: Reported on 12/06/2014), Disp: 90 each, Rfl: 3;  levofloxacin (LEVAQUIN) 500 MG tablet, Take 1 tablet (500 mg total) by mouth daily., Disp: 5 tablet, Rfl: 0;  lidocaine-prilocaine (EMLA) cream, Apply 1 application topically as needed., Disp: 30 g, Rfl: 10 Multiple Vitamins-Minerals (MULTIVITAMIN GUMMIES ADULT PO), Take by mouth every morning., Disp: , Rfl: ;  Probiotic Product (ALIGN) 4 MG CAPS, Take by mouth every morning., Disp: , Rfl: ;  prochlorperazine (COMPAZINE) 10 MG tablet, Take 10 mg by mouth as needed for nausea or vomiting. , Disp: , Rfl: ;  propranolol ER (INDERAL LA) 80 MG 24 hr capsule, Take 80 mg by mouth at bedtime., Disp: , Rfl:  traZODone (DESYREL) 50 MG tablet, Take 1 tablet (50 mg total) by mouth at bedtime. (Patient not taking: Reported on 12/04/2014), Disp: 30 tablet, Rfl: 3;  venlafaxine (EFFEXOR) 37.5 MG tablet, Take 1 tablet (37.5 mg total) by mouth 2 (two) times daily., Disp: 60 tablet, Rfl: 1  Allergies:  Allergies  Allergen Reactions  . Penicillins Other (See Comments)    Hallucinations, from childhood    Past Medical History, Surgical history, Social history, and Family History were reviewed and updated.  Review of Systems: As above  Physical Exam:  vitals were not taken for this visit. The patient was not present so no physical was  done.  Lab Results  Component Value Date   WBC 19.5* 12/18/2014   HGB 10.3* 12/18/2014   HCT 31.1* 12/18/2014   MCV 96 12/18/2014   PLT 224 12/18/2014     Chemistry      Component Value Date/Time   NA 137 12/18/2014 1305   NA 135* 12/01/2014 0600   K 4.5 12/18/2014 1305   K 4.2 12/01/2014 0600   CL 103 12/18/2014 1305   CL 99 12/01/2014 0600   CO2 27 12/18/2014 1305   CO2 24 12/01/2014 0600   BUN 12 12/18/2014 1305   BUN 8 12/01/2014 0600   CREATININE 1.1 12/18/2014 1305   CREATININE 0.83 12/01/2014 0600        Component Value Date/Time   CALCIUM 8.4 12/18/2014 1305   CALCIUM 8.4 12/01/2014 0600   ALKPHOS 74 12/18/2014 1305   ALKPHOS 56 12/01/2014 0600   AST 22 12/18/2014 1305   AST 14 12/01/2014 0600   ALT 10 12/18/2014 1305   ALT 14 12/01/2014 0600   BILITOT 0.60 12/18/2014 1305   BILITOT 0.5 12/01/2014 0600         Impression and Plan: [name] is 63 year old gentleman with metastatic adenocarcinoma the distal esophagus.  His wife came in by herself. I talked to her for about 45 minutes. We decided that we should forego further therapy for right now.  He has this undefined neurological issue. We've evaluated this extensively. He's had MRI. He's had lumbar puncture. He's had blood test for neurologic paraneoplastic syndromes. Everything has come back normal. He has bouts of amnesia and total disorientation. I don't know if he's had a stroke. If he had, one with think that an MRI which show this.  Since he is in remission by his last scans, we will just watch him for right now.  I will do another PET scan on him in mid January.  I think if we have to do any further treatment, we can utilize Cyramza which is a monoclonal antibody therapy. I would like to hope that the FDA will approve one of the immuno- oncology drugs.  His wife has a fantastic job with him. This is just been incredible he tough for her.  I did give him Adderall before. I told her to try him on the Adderall since he appears to be sleeping from what she says. This might give him more energy.  He is on Marinol.  I will plan to see him back after the PET scan is done. I think that if he remains in remission, then we can just follow him along.   [name], MD 12/30/20159:41 AM

## 2014-12-26 ENCOUNTER — Ambulatory Visit: Payer: 59

## 2015-01-15 ENCOUNTER — Telehealth: Payer: Self-pay | Admitting: *Deleted

## 2015-01-15 ENCOUNTER — Encounter (HOSPITAL_COMMUNITY): Payer: Self-pay

## 2015-01-15 ENCOUNTER — Ambulatory Visit (HOSPITAL_COMMUNITY)
Admission: RE | Admit: 2015-01-15 | Discharge: 2015-01-15 | Disposition: A | Discharge status: Home / Self Care | Payer: 59 | Source: Ambulatory Visit | Attending: Hematology & Oncology | Admitting: Hematology & Oncology

## 2015-01-15 DIAGNOSIS — C159 Malignant neoplasm of esophagus, unspecified: Secondary | ICD-10-CM | POA: Diagnosis not present

## 2015-01-15 LAB — GLUCOSE, CAPILLARY: Glucose-Capillary: 97 mg/dL (ref 70–99)

## 2015-01-15 MED ORDER — FLUDEOXYGLUCOSE F - 18 (FDG) INJECTION
9.2000 | Freq: Once | INTRAVENOUS | Status: AC | PRN
  Administered 2015-01-15: 9.2 via INTRAVENOUS

## 2015-01-15 NOTE — Telephone Encounter (Addendum)
Glad to have results.   ----- Message from [name], MD sent at 01/15/2015  1:27 PM EST ----- Call - everything looks stable.  No obvious active cancer.  He is in remission, but NOT cured.  The cancer will start to grow again. i would probably repeat the PET scan in 2-3 months.  pete

## 2015-01-24 ENCOUNTER — Encounter: Payer: Self-pay | Admitting: Hematology & Oncology

## 2015-01-24 ENCOUNTER — Ambulatory Visit (HOSPITAL_BASED_OUTPATIENT_CLINIC_OR_DEPARTMENT_OTHER): Payer: 59 | Admitting: Hematology & Oncology

## 2015-01-24 ENCOUNTER — Other Ambulatory Visit (HOSPITAL_BASED_OUTPATIENT_CLINIC_OR_DEPARTMENT_OTHER): Payer: 59 | Admitting: Lab

## 2015-01-24 VITALS — BP 99/54 | HR 65 | Temp 97.5°F | Resp 18 | Ht 74.0 in | Wt 188.0 lb

## 2015-01-24 DIAGNOSIS — C159 Malignant neoplasm of esophagus, unspecified: Secondary | ICD-10-CM

## 2015-01-24 DIAGNOSIS — I4891 Unspecified atrial fibrillation: Secondary | ICD-10-CM

## 2015-01-24 DIAGNOSIS — C155 Malignant neoplasm of lower third of esophagus: Secondary | ICD-10-CM

## 2015-01-24 LAB — LACTATE DEHYDROGENASE: LDH: 179 U/L (ref 94–250)

## 2015-01-24 LAB — CBC WITH DIFFERENTIAL (CANCER CENTER ONLY)
BASO#: 0 10*3/uL (ref 0.0–0.2)
BASO%: 0.4 % (ref 0.0–2.0)
EOS ABS: 0.2 10*3/uL (ref 0.0–0.5)
EOS%: 2.4 % (ref 0.0–7.0)
HEMATOCRIT: 34.3 % — AB (ref 38.7–49.9)
HEMOGLOBIN: 11.5 g/dL — AB (ref 13.0–17.1)
LYMPH#: 2.3 10*3/uL (ref 0.9–3.3)
LYMPH%: 27 % (ref 14.0–48.0)
MCH: 32.4 pg (ref 28.0–33.4)
MCHC: 33.5 g/dL (ref 32.0–35.9)
MCV: 97 fL (ref 82–98)
MONO#: 0.8 10*3/uL (ref 0.1–0.9)
MONO%: 9.5 % (ref 0.0–13.0)
NEUT#: 5.1 10*3/uL (ref 1.5–6.5)
NEUT%: 60.7 % (ref 40.0–80.0)
Platelets: 201 10*3/uL (ref 145–400)
RBC: 3.55 10*6/uL — ABNORMAL LOW (ref 4.20–5.70)
RDW: 14.9 % (ref 11.1–15.7)
WBC: 8.4 10*3/uL (ref 4.0–10.0)

## 2015-01-24 LAB — CMP (CANCER CENTER ONLY)
ALT: 11 U/L (ref 10–47)
AST: 27 U/L (ref 11–38)
Albumin: 3.4 g/dL (ref 3.3–5.5)
Alkaline Phosphatase: 53 U/L (ref 26–84)
BUN, Bld: 15 mg/dL (ref 7–22)
CO2: 27 mEq/L (ref 18–33)
CREATININE: 0.9 mg/dL (ref 0.6–1.2)
Calcium: 9.1 mg/dL (ref 8.0–10.3)
Chloride: 102 mEq/L (ref 98–108)
GLUCOSE: 79 mg/dL (ref 73–118)
Potassium: 4 mEq/L (ref 3.3–4.7)
Sodium: 143 mEq/L (ref 128–145)
Total Bilirubin: 1.1 mg/dl (ref 0.20–1.60)
Total Protein: 6.5 g/dL (ref 6.4–8.1)

## 2015-01-24 LAB — MAGNESIUM: MAGNESIUM: 1.3 mg/dL — AB (ref 1.5–2.5)

## 2015-01-24 MED ORDER — PROPRANOLOL HCL ER 60 MG PO CP24: 80.0000 mg | ORAL_CAPSULE | Freq: Every day | ORAL | Status: DC

## 2015-01-24 NOTE — Progress Notes (Signed)
Hematology and Oncology Follow Up Visit  Willie Owens [id number] [birth year] 64 y.o. 01/24/2015   Principle Diagnosis:  Metastatic adenocarcinoma of the distal esophagus  Current Therapy:    Status post 3 cycles of cis-platinum/Taxotere     Interim History:  Mr.  Willie Owens comes in for follow-up. He really looks much better. His memory is coming back. He is eating better. He looks better overall.  We did a PET scan on him. There is some stable, low level, hypermetabolism in the esophagus. No masses noted. There is no active adenopathy that we can see. Everything is pretty much stable compared to the last scan that he had.  He is eating well. He has no dysphagia or odynophagia.  He did have a fall a couple days ago. His blood pressures been on the low side. I think that his Inderal dose can be decreased a little bit.  He's had no problems with diarrhea. He's had no pain. He's had no rashes. He's had no leg swelling.  Again, his memory is much better. He's able to answer questions and talk and is much more coherent.  Currently, his performance status is ECOG 1.  Medications:  Current outpatient prescriptions:  .  B Complex-C (SUPER B COMPLEX PO), Take 1 tablet by mouth daily., Disp: , Rfl:  .  benzonatate (TESSALON PERLES) 100 MG capsule, Take 1 capsule (100 mg total) by mouth 3 (three) times daily as needed for cough., Disp: 90 capsule, Rfl: 0 .  Cholecalciferol (VITAMIN D) 2000 UNITS tablet, Take 2,000 Units by mouth daily., Disp: , Rfl:  .  dronabinol (MARINOL) 5 MG capsule, Take 5 mg by mouth 2 (two) times daily before a meal., Disp: , Rfl:  .  esomeprazole (NEXIUM) 40 MG packet, Take 40 mg by mouth daily before breakfast., Disp: 90 each, Rfl: 3 .  lidocaine-prilocaine (EMLA) cream, Apply 1 application topically as needed., Disp: 30 g, Rfl: 10 .  Multiple Vitamins-Minerals (MULTIVITAMIN GUMMIES ADULT PO), Take by mouth every morning., Disp: , Rfl:  .  potassium chloride SA  (K-DUR,KLOR-CON) 20 MEQ tablet, Take 20 mEq by mouth daily., Disp: , Rfl:  .  Probiotic Product (ALIGN) 4 MG CAPS, Take by mouth every morning., Disp: , Rfl:  .  prochlorperazine (COMPAZINE) 10 MG tablet, Take 10 mg by mouth as needed for nausea or vomiting. , Disp: , Rfl:  .  propranolol ER (INDERAL LA) 60 MG 24 hr capsule, Take 1 capsule (60 mg total) by mouth at bedtime., Disp: 30 capsule, Rfl: 6 .  venlafaxine (EFFEXOR) 37.5 MG tablet, Take 1 tablet (37.5 mg total) by mouth 2 (two) times daily., Disp: 60 tablet, Rfl: 1 .  clonazePAM (KLONOPIN) 0.5 MG tablet, Take 1 tablet (0.5 mg total) by mouth 3 (three) times daily as needed (anxiety). (Patient not taking: Reported on 12/06/2014), Disp: 30 tablet, Rfl: 0 .  dexamethasone (DECADRON) 4 MG tablet, Take 8 mg by mouth 2 (two) times daily with a meal. Start the day after chemo for three days, Disp: , Rfl:  .  diphenoxylate-atropine (LOMOTIL) 2.5-0.025 MG per tablet, Take 1 tablet by mouth 4 (four) times daily as needed for diarrhea or loose stools. (Patient not taking: Reported on 12/06/2014), Disp: 30 tablet, Rfl: 2 .  traZODone (DESYREL) 50 MG tablet, Take 1 tablet (50 mg total) by mouth at bedtime. (Patient not taking: Reported on 12/04/2014), Disp: 30 tablet, Rfl: 3  Allergies:  Allergies  Allergen Reactions  . Penicillins Other (See Comments)  Hallucinations, from childhood    Past Medical History, Surgical history, Social history, and Family History were reviewed and updated.  Review of Systems: As above  Physical Exam:  height is 6\' 2"  (1.88 m) and weight is 188 lb (85.276 kg). His oral temperature is 97.5 F (36.4 C). His blood pressure is 99/54 and his pulse is 65. His respiration is 18.  Well-developed and well-nourished white gentleman. Head and neck exam shows no ocular or oral lesions. He has no palpable cervical or supraclavicular lymph nodes. Lungs are clear. Cardiac exam regular rate and rhythm with no murmurs, rubs or  bruits. Abdomen is soft. Has good bowel sounds. There is no fluid wave. There is no palpable liver or spleen tip. Back exam shows no tenderness over the spine, ribs or hips. Extremities shows no clubbing, cyanosis or edema. Neurological exam is nonfocal.  Lab Results  Component Value Date   WBC 8.4 01/24/2015   HGB 11.5* 01/24/2015   HCT 34.3* 01/24/2015   MCV 97 01/24/2015   PLT 201 01/24/2015     Chemistry      Component Value Date/Time   NA 143 01/24/2015 1417   NA 135* 12/01/2014 0600   K 4.0 01/24/2015 1417   K 4.2 12/01/2014 0600   CL 102 01/24/2015 1417   CL 99 12/01/2014 0600   CO2 27 01/24/2015 1417   CO2 24 12/01/2014 0600   BUN 15 01/24/2015 1417   BUN 8 12/01/2014 0600   CREATININE 0.9 01/24/2015 1417   CREATININE 0.83 12/01/2014 0600      Component Value Date/Time   CALCIUM 9.1 01/24/2015 1417   CALCIUM 8.4 12/01/2014 0600   ALKPHOS 53 01/24/2015 1417   ALKPHOS 56 12/01/2014 0600   AST 27 01/24/2015 1417   AST 14 12/01/2014 0600   ALT 11 01/24/2015 1417   ALT 14 12/01/2014 0600   BILITOT 1.10 01/24/2015 1417   BILITOT 0.5 12/01/2014 0600         Impression and Plan: [name] is 64 year old gentleman with metastatic adenocarcinoma the distal esophagus.  He is looking much better now. I still cannot figure out why or how the chemotherapy may have affected his memory.  By his PET scan, things are looking pretty stable. He is totally asymptomatic. As such, I really don't see that we need to embark upon any therapy for him right now area did  I'm just happy that his quality of life is better. His memory is better.  I think we should repeat a PET scan in about 2 months. If we start to see any activity, and if he is in good shape, then we can readdress therapy.  I spent about 25-30 minutes with he and his wife. I went over the scans and his labs.  [name], MD 1/29/20166:36 PM

## 2015-01-27 ENCOUNTER — Telehealth: Payer: Self-pay | Admitting: *Deleted

## 2015-01-27 DIAGNOSIS — C159 Malignant neoplasm of esophagus, unspecified: Secondary | ICD-10-CM

## 2015-01-27 MED ORDER — MAGNESIUM OXIDE 400 (241.3 MG) MG PO TABS: 400.0000 mg | ORAL_TABLET | Freq: Two times a day (BID) | ORAL | Status: DC

## 2015-01-27 NOTE — Telephone Encounter (Addendum)
Spoke with Willie Owens. She's aware of new prescription.  ----- Message from [name], MD sent at 01/27/2015  7:31 AM EST ----- Call - magnesium is very low.  Please call in Magnesium Oxide 400mg  po BID. Laurey Arrow

## 2015-01-30 ENCOUNTER — Ambulatory Visit (HOSPITAL_BASED_OUTPATIENT_CLINIC_OR_DEPARTMENT_OTHER): Payer: 59

## 2015-01-30 VITALS — BP 120/64 | HR 69 | Temp 97.5°F | Resp 16

## 2015-01-30 DIAGNOSIS — C159 Malignant neoplasm of esophagus, unspecified: Secondary | ICD-10-CM

## 2015-01-30 DIAGNOSIS — Z452 Encounter for adjustment and management of vascular access device: Secondary | ICD-10-CM

## 2015-01-30 DIAGNOSIS — C155 Malignant neoplasm of lower third of esophagus: Secondary | ICD-10-CM

## 2015-01-30 MED ORDER — HEPARIN SOD (PORK) LOCK FLUSH 100 UNIT/ML IV SOLN
500.0000 [IU] | Freq: Once | INTRAVENOUS | Status: AC
  Administered 2015-01-30: 500 [IU] via INTRAVENOUS
  Filled 2015-01-30: qty 5

## 2015-01-30 MED ORDER — SODIUM CHLORIDE 0.9 % IJ SOLN
10.0000 mL | INTRAMUSCULAR | Status: DC | PRN
  Administered 2015-01-30: 10 mL via INTRAVENOUS
  Filled 2015-01-30: qty 10

## 2015-01-30 NOTE — Patient Instructions (Signed)

## 2015-02-19 ENCOUNTER — Encounter: Payer: Self-pay | Admitting: Hematology & Oncology

## 2015-02-19 ENCOUNTER — Other Ambulatory Visit: Payer: Self-pay | Admitting: Hematology & Oncology

## 2015-02-19 ENCOUNTER — Other Ambulatory Visit: Payer: Self-pay | Admitting: *Deleted

## 2015-02-19 ENCOUNTER — Telehealth: Payer: Self-pay | Admitting: *Deleted

## 2015-02-19 DIAGNOSIS — C159 Malignant neoplasm of esophagus, unspecified: Secondary | ICD-10-CM

## 2015-02-19 MED ORDER — AMPHETAMINE-DEXTROAMPHETAMINE 10 MG PO TABS
10.0000 mg | ORAL_TABLET | Freq: Two times a day (BID) | ORAL | Status: DC
Start: 2015-02-19 — End: 2015-03-20

## 2015-02-19 NOTE — Telephone Encounter (Signed)
Patient's wife called for adderall refill. Dr Marin Olp does not want patient to continue on this medication at this time. She also mentioned numbness and tingling of his feet. Dr Marin Olp made aware, but no new orders at this time.

## 2015-03-07 ENCOUNTER — Encounter (HOSPITAL_COMMUNITY)
Admission: RE | Admit: 2015-03-07 | Discharge: 2015-03-07 | Disposition: A | Discharge status: Home / Self Care | Payer: 59 | Source: Ambulatory Visit | Attending: Hematology & Oncology | Admitting: Hematology & Oncology

## 2015-03-07 DIAGNOSIS — C159 Malignant neoplasm of esophagus, unspecified: Secondary | ICD-10-CM | POA: Insufficient documentation

## 2015-03-07 LAB — GLUCOSE, CAPILLARY: Glucose-Capillary: 81 mg/dL (ref 70–99)

## 2015-03-07 MED ORDER — FLUDEOXYGLUCOSE F - 18 (FDG) INJECTION
9.4000 | Freq: Once | INTRAVENOUS | Status: AC | PRN
  Administered 2015-03-07: 9.4 via INTRAVENOUS

## 2015-03-13 ENCOUNTER — Other Ambulatory Visit: Payer: Self-pay | Admitting: Family

## 2015-03-19 ENCOUNTER — Other Ambulatory Visit: Payer: Self-pay | Admitting: Hematology & Oncology

## 2015-03-19 DIAGNOSIS — C159 Malignant neoplasm of esophagus, unspecified: Secondary | ICD-10-CM

## 2015-03-20 ENCOUNTER — Encounter: Payer: Self-pay | Admitting: Hematology & Oncology

## 2015-03-20 ENCOUNTER — Other Ambulatory Visit (HOSPITAL_BASED_OUTPATIENT_CLINIC_OR_DEPARTMENT_OTHER): Payer: 59

## 2015-03-20 ENCOUNTER — Ambulatory Visit (HOSPITAL_BASED_OUTPATIENT_CLINIC_OR_DEPARTMENT_OTHER): Payer: 59 | Admitting: Hematology & Oncology

## 2015-03-20 ENCOUNTER — Ambulatory Visit (HOSPITAL_BASED_OUTPATIENT_CLINIC_OR_DEPARTMENT_OTHER): Payer: 59

## 2015-03-20 VITALS — BP 128/70 | HR 71 | Temp 97.5°F | Resp 18 | Ht 73.0 in | Wt 199.0 lb

## 2015-03-20 DIAGNOSIS — C155 Malignant neoplasm of lower third of esophagus: Secondary | ICD-10-CM

## 2015-03-20 DIAGNOSIS — R64 Cachexia: Secondary | ICD-10-CM | POA: Diagnosis not present

## 2015-03-20 DIAGNOSIS — C159 Malignant neoplasm of esophagus, unspecified: Secondary | ICD-10-CM

## 2015-03-20 DIAGNOSIS — G5790 Unspecified mononeuropathy of unspecified lower limb: Secondary | ICD-10-CM

## 2015-03-20 LAB — COMPREHENSIVE METABOLIC PANEL
ALK PHOS: 64 U/L (ref 39–117)
ALT: 13 U/L (ref 0–53)
AST: 20 U/L (ref 0–37)
Albumin: 3.8 g/dL (ref 3.5–5.2)
BUN: 22 mg/dL (ref 6–23)
CO2: 25 mEq/L (ref 19–32)
Calcium: 8.9 mg/dL (ref 8.4–10.5)
Chloride: 101 mEq/L (ref 96–112)
Creatinine, Ser: 1.04 mg/dL (ref 0.50–1.35)
Glucose, Bld: 89 mg/dL (ref 70–99)
Potassium: 4.1 mEq/L (ref 3.5–5.3)
Sodium: 138 mEq/L (ref 135–145)
Total Bilirubin: 0.6 mg/dL (ref 0.2–1.2)
Total Protein: 6.2 g/dL (ref 6.0–8.3)

## 2015-03-20 LAB — CBC WITH DIFFERENTIAL (CANCER CENTER ONLY)
BASO#: 0 10*3/uL (ref 0.0–0.2)
BASO%: 0.4 % (ref 0.0–2.0)
EOS%: 1.6 % (ref 0.0–7.0)
Eosinophils Absolute: 0.1 10*3/uL (ref 0.0–0.5)
HCT: 35.4 % — ABNORMAL LOW (ref 38.7–49.9)
HEMOGLOBIN: 12 g/dL — AB (ref 13.0–17.1)
LYMPH#: 2.1 10*3/uL (ref 0.9–3.3)
LYMPH%: 28 % (ref 14.0–48.0)
MCH: 31.8 pg (ref 28.0–33.4)
MCHC: 33.9 g/dL (ref 32.0–35.9)
MCV: 94 fL (ref 82–98)
MONO#: 0.8 10*3/uL (ref 0.1–0.9)
MONO%: 10.1 % (ref 0.0–13.0)
NEUT#: 4.6 10*3/uL (ref 1.5–6.5)
NEUT%: 59.9 % (ref 40.0–80.0)
Platelets: 194 10*3/uL (ref 145–400)
RBC: 3.77 10*6/uL — AB (ref 4.20–5.70)
RDW: 13 % (ref 11.1–15.7)
WBC: 7.6 10*3/uL (ref 4.0–10.0)

## 2015-03-20 MED ORDER — DRONABINOL 5 MG PO CAPS: 5.0000 mg | ORAL_CAPSULE | Freq: Two times a day (BID) | ORAL | Status: DC

## 2015-03-20 MED ORDER — SODIUM CHLORIDE 0.9 % IJ SOLN
10.0000 mL | INTRAMUSCULAR | Status: DC | PRN
  Administered 2015-03-20: 10 mL via INTRAVENOUS
  Filled 2015-03-20: qty 10

## 2015-03-20 MED ORDER — HEPARIN SOD (PORK) LOCK FLUSH 100 UNIT/ML IV SOLN
500.0000 [IU] | Freq: Once | INTRAVENOUS | Status: AC
  Administered 2015-03-20: 500 [IU] via INTRAVENOUS
  Filled 2015-03-20: qty 5

## 2015-03-20 MED ORDER — VITAMIN B-6 100 MG PO TABS: ORAL_TABLET | ORAL | Status: AC

## 2015-03-20 NOTE — Patient Instructions (Signed)

## 2015-03-20 NOTE — Progress Notes (Signed)
Hematology and Oncology Follow Up Visit  Willie Owens [id number] 11/10/1951 64 y.o. 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 We did go ahead and do a PET scan on him. Unfortunately, the PET scan did show that there was new lymphadenopathy in the upper abdomen. This clearly was not present on the prior PET scan.  He actually looks quite good. He is eating. He's clear in his thinking. He is able to converse without any problems.  He is not hurting. He's having no nausea or vomiting. He's having no change in bowel or bladder habits. There is no leg swelling. He's had no rashes.  Overall, his performance status is ECOG 1.  Medications:  Current outpatient prescriptions:  .  B Complex-C (SUPER B COMPLEX PO), Take 1 tablet by mouth daily., Disp: , Rfl:  .  benzonatate (TESSALON PERLES) 100 MG capsule, Take 1 capsule (100 mg total) by mouth 3 (three) times daily as needed for cough., Disp: 90 capsule, Rfl: 0 .  Cholecalciferol (VITAMIN D) 2000 UNITS tablet, Take 2,000 Units by mouth daily., Disp: , Rfl:  .  diphenoxylate-atropine (LOMOTIL) 2.5-0.025 MG per tablet, Take 1 tablet by mouth 4 (four) times daily as needed for diarrhea or loose stools., Disp: 30 tablet, Rfl: 2 .  dronabinol (MARINOL) 5 MG capsule, Take 1 capsule (5 mg total) by mouth 2 (two) times daily before a meal., Disp: 60 capsule, Rfl: 0 .  esomeprazole (NEXIUM) 40 MG packet, Take 40 mg by mouth daily before breakfast., Disp: 90 each, Rfl: 3 .  lidocaine-prilocaine (EMLA) cream, Apply 1 application topically as needed., Disp: 30 g, Rfl: 10 .  magnesium oxide (MAG-OX) 400 (241.3 MG) MG tablet, Take 1 tablet (400 mg total) by mouth 2 (two) times daily., Disp: 60 tablet, Rfl: 3 .  Multiple Vitamins-Minerals (MULTIVITAMIN GUMMIES ADULT PO), Take by mouth every morning.,  Disp: , Rfl:  .  potassium chloride SA (K-DUR,KLOR-CON) 20 MEQ tablet, Take 20 mEq by mouth daily., Disp: , Rfl:  .  Probiotic Product (ALIGN) 4 MG CAPS, Take by mouth every morning., Disp: , Rfl:  .  propranolol ER (INDERAL LA) 60 MG 24 hr capsule, Take 1 capsule (60 mg total) by mouth at bedtime., Disp: 30 capsule, Rfl: 6 .  traZODone (DESYREL) 50 MG tablet, Take 1 tablet (50 mg total) by mouth at bedtime., Disp: 30 tablet, Rfl: 3 .  venlafaxine (EFFEXOR) 37.5 MG tablet, TAKE 1 TABLET BY MOUTH TWICE DAILY, Disp: 60 tablet, Rfl: 0 .  clonazePAM (KLONOPIN) 0.5 MG tablet, Take 1 tablet (0.5 mg total) by mouth 3 (three) times daily as needed (anxiety). (Patient not taking: Reported on 12/06/2014), Disp: 30 tablet, Rfl: 0 .  dexamethasone (DECADRON) 4 MG tablet, Take 8 mg by mouth 2 (two) times daily with a meal. Start the day after chemo for three days, Disp: , Rfl:  .  prochlorperazine (COMPAZINE) 10 MG tablet, Take 10 mg by mouth as needed for nausea or vomiting. , Disp: , Rfl:  .  pyridOXINE (VITAMIN B-6) 100 MG tablet, Take 2 pills at one time once a day., Disp: 60 tablet, Rfl: 6 No current facility-administered medications for this visit.  Facility-Administered Medications Ordered in Other Visits:  .  sodium chloride 0.9 % injection 10 mL, 10 mL, Intravenous, PRN, [name], MD, 10 mL  at 03/20/15 1524  Allergies:  Allergies  Allergen Reactions  . Penicillins Other (See Comments)    Hallucinations, from childhood    Past Medical History, Surgical history, Social history, and Family History were reviewed and updated.  Review of Systems: As above  Physical Exam:  height is 6' 1"  (1.854 m) and weight is 199 lb (90.266 kg). His oral temperature is 97.5 F (36.4 C). His blood pressure is 128/70 and his pulse is 71. His respiration is 18.   Wt Readings from Last 3 Encounters:  03/20/15 199 lb (90.266 kg)  01/24/15 188 lb (85.276 kg)  12/18/14 186 lb (84.369 kg)      Well-developed and well-nourished white gym in no obvious distress. Head and neck exam shows no ocular or oral lesions. There are no palpable cervical or supraclavicular lymph nodes. Lungs are clear. Cardiac exam regular rate and rhythm with no murmurs, rubs or bruits. Abdomen is soft. There is no palpable abdominal mass. There is no fluid wave. There is no guarding or rebound tenderness. There is no palpable liver or spleen tip. Back exam shows no tenderness over the spine, ribs or hips. Extremities shows no clubbing, cyanosis or edema. He has good strength in his joints. He has good range of motion of his joints. Neurological exam is nonfocal. Skin exam no rashes, ecchymoses or petechia.  Lab Results  Component Value Date   WBC 7.6 03/20/2015   HGB 12.0* 03/20/2015   HCT 35.4* 03/20/2015   MCV 94 03/20/2015   PLT 194 03/20/2015     Chemistry      Component Value Date/Time   NA 143 01/24/2015 1417   NA 135* 12/01/2014 0600   K 4.0 01/24/2015 1417   K 4.2 12/01/2014 0600   CL 102 01/24/2015 1417   CL 99 12/01/2014 0600   CO2 27 01/24/2015 1417   CO2 24 12/01/2014 0600   BUN 15 01/24/2015 1417   BUN 8 12/01/2014 0600   CREATININE 0.9 01/24/2015 1417   CREATININE 0.83 12/01/2014 0600      Component Value Date/Time   CALCIUM 9.1 01/24/2015 1417   CALCIUM 8.4 12/01/2014 0600   ALKPHOS 53 01/24/2015 1417   ALKPHOS 56 12/01/2014 0600   AST 27 01/24/2015 1417   AST 14 12/01/2014 0600   ALT 11 01/24/2015 1417   ALT 14 12/01/2014 0600   BILITOT 1.10 01/24/2015 1417   BILITOT 0.5 12/01/2014 0600         Impression and Plan: Willie Owens is 64 year old gentleman. He has metastatic adenocarcinoma of the distal esophagus. He actually responded fairly well to systemic chemotherapy. However, he had a incredibly unusual and rare neurological issue in which he had amnesia., Still not sure as to what was the "trigger".  I think that he is in good enough shape that he could tolerate  second line therapy.  I talked to he and his wife about Cyramza. I think single agent Cyramza would be worthwhile. I know that there is using commendation with chemotherapy but I just think that single agent therapy would be reasonable. He does not have a large bulk of disease and I think this is very helpful.  I would go ahead and treat him with 4 cycles of Cyramza and then plan for another PET scan. I think this would be very reasonable.  I spent about 40 minutes with he and his wife. I showed them the PET scan. I again, went over the side effects of Cyramza. They understand  this.  We will get started next week.  He will then come back 2 weeks after his first cycle for follow-up.   [name], MD 3/24/20164:19 PM

## 2015-03-21 ENCOUNTER — Telehealth: Payer: Self-pay | Admitting: Hematology & Oncology

## 2015-03-21 LAB — LACTATE DEHYDROGENASE: LDH: 140 U/L (ref 94–250)

## 2015-03-21 LAB — PREALBUMIN: PREALBUMIN: 35 mg/dL (ref 21–43)

## 2015-03-21 NOTE — Telephone Encounter (Signed)
Case APPROVED: [id number] - CHEMO Valid: 03/21/2015 - 03/25/2016 Esophageal cancer - Primary ICD-9-150.9 ICD-10-C15.9    [id number] - CYRAMZA RAMUCIRUMAB

## 2015-03-26 ENCOUNTER — Ambulatory Visit: Payer: 59

## 2015-03-26 ENCOUNTER — Other Ambulatory Visit (HOSPITAL_BASED_OUTPATIENT_CLINIC_OR_DEPARTMENT_OTHER): Payer: 59

## 2015-03-26 ENCOUNTER — Other Ambulatory Visit: Payer: Self-pay | Admitting: Nurse Practitioner

## 2015-03-26 ENCOUNTER — Ambulatory Visit (HOSPITAL_BASED_OUTPATIENT_CLINIC_OR_DEPARTMENT_OTHER): Payer: 59

## 2015-03-26 DIAGNOSIS — C159 Malignant neoplasm of esophagus, unspecified: Secondary | ICD-10-CM

## 2015-03-26 DIAGNOSIS — Z5112 Encounter for antineoplastic immunotherapy: Secondary | ICD-10-CM | POA: Diagnosis not present

## 2015-03-26 LAB — UA PROTEIN, DIPSTICK - CHCC SATELLITE: Protein, Urine: <30 mg/dL

## 2015-03-26 MED ORDER — OMEPRAZOLE 40 MG PO CPDR: 40.0000 mg | DELAYED_RELEASE_CAPSULE | Freq: Every day | ORAL | Status: DC

## 2015-03-26 MED ORDER — HEPARIN SOD (PORK) LOCK FLUSH 100 UNIT/ML IV SOLN
500.0000 [IU] | Freq: Once | INTRAVENOUS | Status: AC | PRN
  Administered 2015-03-26: 500 [IU]
  Filled 2015-03-26: qty 5

## 2015-03-26 MED ORDER — ACETAMINOPHEN 325 MG PO TABS
ORAL_TABLET | ORAL | Status: AC
Start: 2015-03-26 — End: 2015-03-26
  Filled 2015-03-26: qty 2

## 2015-03-26 MED ORDER — DIPHENHYDRAMINE HCL 50 MG/ML IJ SOLN
INTRAMUSCULAR | Status: AC
Start: 2015-03-26 — End: 2015-03-26
  Filled 2015-03-26: qty 1

## 2015-03-26 MED ORDER — SODIUM CHLORIDE 0.9 % IJ SOLN
10.0000 mL | INTRAMUSCULAR | Status: DC | PRN
  Administered 2015-03-26: 10 mL
  Filled 2015-03-26: qty 10

## 2015-03-26 MED ORDER — SODIUM CHLORIDE 0.9 % IV SOLN
8.0000 mg/kg | Freq: Once | INTRAVENOUS | Status: AC
  Administered 2015-03-26: 700 mg via INTRAVENOUS
  Filled 2015-03-26: qty 70

## 2015-03-26 MED ORDER — SODIUM CHLORIDE 0.9 % IV SOLN
Freq: Once | INTRAVENOUS | Status: AC
  Administered 2015-03-26: 14:00:00 via INTRAVENOUS

## 2015-03-26 MED ORDER — ACETAMINOPHEN 325 MG PO TABS
650.0000 mg | ORAL_TABLET | Freq: Once | ORAL | Status: AC
  Administered 2015-03-26: 650 mg via ORAL

## 2015-03-26 MED ORDER — DIPHENHYDRAMINE HCL 50 MG/ML IJ SOLN
50.0000 mg | Freq: Once | INTRAMUSCULAR | Status: AC
  Administered 2015-03-26: 50 mg via INTRAVENOUS

## 2015-03-26 NOTE — Patient Instructions (Signed)
Ramucirumab injection What is this medicine? RAMUCIRUMAB (ra mue SIR ue mab) is a chemotherapy drug. It is used to treat stomach cancer, colorectal cancer, or lung cancer. This drug targets a specific protein receptor on cancer cells and stops the cancer cells from growing. This medicine may be used for other purposes; ask your health care provider or pharmacist if you have questions. COMMON BRAND NAME(S): Cyramza What should I tell my health care provider before I take this medicine? They need to know if you have any of these conditions: -bleeding disorders -blood clots -heart disease, including heart failure, heart attack, or chest pain (angina) -high blood pressure -infection (especially a virus infection such as chickenpox, cold sores, or herpes) -protein in your urine -recent surgery -stroke -an unusual or allergic reaction to ramucirumab, other medicines, foods, dyes, or preservatives -pregnant or trying to get pregnant -breast-feeding How should I use this medicine? This medicine is for infusion into a vein. It is given by a health care professional in a hospital or clinic setting. Talk to your pediatrician regarding the use of this medicine in children. Special care may be needed. Overdosage: If you think you've taken too much of this medicine contact a poison control center or emergency room at once. Overdosage: If you think you have taken too much of this medicine contact a poison control center or emergency room at once. NOTE: This medicine is only for you. Do not share this medicine with others. What if I miss a dose? It is important not to miss your dose. Call your doctor or health care professional if you are unable to keep an appointment. What may interact with this medicine? Interactions have not been studied. This list may not describe all possible interactions. Give your health care provider a list of all the medicines, herbs, non-prescription drugs, or dietary  supplements you use. Also tell them if you smoke, drink alcohol, or use illegal drugs. Some items may interact with your medicine. What should I watch for while using this medicine? Your condition will be monitored carefully while you are receiving this medicine. You will need to to check your blood pressure and have your blood and urine tested while you are taking this medicine. Your condition will be monitored carefully while you are receiving this medicine. This medicine may increase your risk to bruise or bleed. Call your doctor or health care professional if you notice any unusual bleeding. This medicine may rarely cause 'gastrointestinal perforation' (holes in the stomach, intestines or colon), a serious side effect requiring surgery to repair. This medicine should be started at least 28 days following major surgery and the site of the surgery should be totally healed. Check with your doctor before scheduling dental work or surgery while you are receiving this treatment. Talk to your doctor if you have recently had surgery or if you have a wound that has not healed. Do not become pregnant while taking this medicine or for 3 months after stopping it. Women should inform their doctor if they wish to become pregnant or think they might be pregnant. There is a potential for serious side effects to an unborn child. Talk to your health care professional or pharmacist for more information. What side effects may I notice from receiving this medicine? Side effects that you should report to your doctor or health care professional as soon as possible: -allergic reactions like skin rash, itching or hives, breathing problems, swelling of the face, lips, or tongue -signs of infection - fever  or chills, cough, sore throat -chest pain or chest tightness -confusion -dizziness -feeling faint or lightheaded, falls -severe abdominal pain -severe nausea, vomiting -signs and symptoms of bleeding such as bloody or  black, tarry stools; red or dark-brown urine; spitting up blood or brown material that looks like coffee grounds; red spots on the skin; unusual bruising or bleeding from the eye, gums, or nose -signs and symptoms of a blood clot such as breathing problems; changes in vision; chest pain; severe, sudden headache; pain, swelling, warmth in the leg; trouble speaking; sudden numbness or weakness of the face, arm or leg -symptoms of a stroke: change in mental awareness, inability to talk or move one side of the body -trouble walking, dizziness, loss of balance or coordination Side effects that usually do not require medical attention (Report these to your doctor or health care professional if they continue or are bothersome.): -cold, clammy skin -constipation -diarrhea -headache -nausea, vomiting -stomach pain -unusually slow heartbeat -unusually weak or tired This list may not describe all possible side effects. Call your doctor for medical advice about side effects. You may report side effects to FDA at 1-800-FDA-1088. Where should I keep my medicine? This drug is given in a hospital or clinic and will not be stored at home. NOTE: This sheet is a summary. It may not cover all possible information. If you have questions about this medicine, talk to your doctor, pharmacist, or health care provider.  2015, [employer] Standard. (2014-04-23 13:05:27)

## 2015-03-27 ENCOUNTER — Encounter: Payer: Self-pay | Admitting: Hematology & Oncology

## 2015-03-27 ENCOUNTER — Ambulatory Visit: Payer: 59 | Admitting: Hematology & Oncology

## 2015-03-27 ENCOUNTER — Other Ambulatory Visit: Payer: 59

## 2015-04-09 ENCOUNTER — Ambulatory Visit (HOSPITAL_BASED_OUTPATIENT_CLINIC_OR_DEPARTMENT_OTHER): Payer: 59

## 2015-04-09 ENCOUNTER — Other Ambulatory Visit (HOSPITAL_BASED_OUTPATIENT_CLINIC_OR_DEPARTMENT_OTHER): Payer: 59

## 2015-04-09 ENCOUNTER — Ambulatory Visit (HOSPITAL_BASED_OUTPATIENT_CLINIC_OR_DEPARTMENT_OTHER): Payer: 59 | Admitting: Family

## 2015-04-09 ENCOUNTER — Encounter: Payer: Self-pay | Admitting: Family

## 2015-04-09 VITALS — BP 136/71 | HR 69 | Temp 97.3°F | Resp 18 | Wt 201.0 lb

## 2015-04-09 DIAGNOSIS — C159 Malignant neoplasm of esophagus, unspecified: Secondary | ICD-10-CM

## 2015-04-09 DIAGNOSIS — Z5111 Encounter for antineoplastic chemotherapy: Secondary | ICD-10-CM | POA: Diagnosis not present

## 2015-04-09 DIAGNOSIS — C155 Malignant neoplasm of lower third of esophagus: Secondary | ICD-10-CM | POA: Diagnosis not present

## 2015-04-09 DIAGNOSIS — R197 Diarrhea, unspecified: Secondary | ICD-10-CM

## 2015-04-09 DIAGNOSIS — R110 Nausea: Secondary | ICD-10-CM | POA: Diagnosis not present

## 2015-04-09 LAB — CBC WITH DIFFERENTIAL (CANCER CENTER ONLY)
BASO#: 0 10*3/uL (ref 0.0–0.2)
BASO%: 0.4 % (ref 0.0–2.0)
EOS%: 1.8 % (ref 0.0–7.0)
Eosinophils Absolute: 0.1 10*3/uL (ref 0.0–0.5)
HCT: 39.4 % (ref 38.7–49.9)
HGB: 13.4 g/dL (ref 13.0–17.1)
LYMPH#: 2.2 10*3/uL (ref 0.9–3.3)
LYMPH%: 32.1 % (ref 14.0–48.0)
MCH: 31.7 pg (ref 28.0–33.4)
MCHC: 34 g/dL (ref 32.0–35.9)
MCV: 93 fL (ref 82–98)
MONO#: 0.6 10*3/uL (ref 0.1–0.9)
MONO%: 8.8 % (ref 0.0–13.0)
NEUT%: 56.9 % (ref 40.0–80.0)
NEUTROS ABS: 3.9 10*3/uL (ref 1.5–6.5)
PLATELETS: 159 10*3/uL (ref 145–400)
RBC: 4.23 10*6/uL (ref 4.20–5.70)
RDW: 13.8 % (ref 11.1–15.7)
WBC: 6.8 10*3/uL (ref 4.0–10.0)

## 2015-04-09 LAB — CMP (CANCER CENTER ONLY)
ALBUMIN: 3.3 g/dL (ref 3.3–5.5)
ALT(SGPT): 22 U/L (ref 10–47)
AST: 32 U/L (ref 11–38)
Alkaline Phosphatase: 72 U/L (ref 26–84)
BUN: 20 mg/dL (ref 7–22)
CALCIUM: 9.2 mg/dL (ref 8.0–10.3)
CHLORIDE: 104 meq/L (ref 98–108)
CO2: 31 mEq/L (ref 18–33)
CREATININE: 1.5 mg/dL — AB (ref 0.6–1.2)
GLUCOSE: 62 mg/dL — AB (ref 73–118)
POTASSIUM: 4.2 meq/L (ref 3.3–4.7)
SODIUM: 141 meq/L (ref 128–145)
Total Bilirubin: 0.8 mg/dl (ref 0.20–1.60)
Total Protein: 7.1 g/dL (ref 6.4–8.1)

## 2015-04-09 LAB — LACTATE DEHYDROGENASE: LDH: 152 U/L (ref 94–250)

## 2015-04-09 MED ORDER — SODIUM CHLORIDE 0.9 % IJ SOLN
10.0000 mL | INTRAMUSCULAR | Status: DC | PRN
  Administered 2015-04-09: 10 mL
  Filled 2015-04-09: qty 10

## 2015-04-09 MED ORDER — ACETAMINOPHEN 325 MG PO TABS
ORAL_TABLET | ORAL | Status: AC
  Filled 2015-04-09: qty 2

## 2015-04-09 MED ORDER — DIPHENHYDRAMINE HCL 50 MG/ML IJ SOLN
50.0000 mg | Freq: Once | INTRAMUSCULAR | Status: AC
  Administered 2015-04-09: 50 mg via INTRAVENOUS

## 2015-04-09 MED ORDER — SODIUM CHLORIDE 0.9 % IV SOLN
Freq: Once | INTRAVENOUS | Status: AC
  Administered 2015-04-09: 10:00:00 via INTRAVENOUS

## 2015-04-09 MED ORDER — HEPARIN SOD (PORK) LOCK FLUSH 100 UNIT/ML IV SOLN
500.0000 [IU] | Freq: Once | INTRAVENOUS | Status: AC | PRN
  Administered 2015-04-09: 500 [IU]
  Filled 2015-04-09: qty 5

## 2015-04-09 MED ORDER — ACETAMINOPHEN 325 MG PO TABS
650.0000 mg | ORAL_TABLET | Freq: Once | ORAL | Status: AC
  Administered 2015-04-09: 650 mg via ORAL

## 2015-04-09 MED ORDER — SODIUM CHLORIDE 0.9 % IV SOLN
8.0000 mg/kg | Freq: Once | INTRAVENOUS | Status: AC
  Administered 2015-04-09: 700 mg via INTRAVENOUS
  Filled 2015-04-09: qty 50

## 2015-04-09 MED ORDER — DIPHENHYDRAMINE HCL 50 MG/ML IJ SOLN
INTRAMUSCULAR | Status: AC
  Filled 2015-04-09: qty 1

## 2015-04-09 NOTE — Progress Notes (Signed)
Hematology and Oncology Follow Up Visit  Willie Owens [id number] 1951/11/25 64 y.o. 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 He is doing well and has experienced minimal side effects. He had some nausea afterwards that was relieved with Compazine. He has also had some diarrhea which was relived with Lomotil.  He is coherent and oriented x3. He denies fever, chills, vomiting, cough, rash, dizziness, SOB, chest pain, palpitations, abdominal pain, problems urinating, constipation, blood in urine or stool.  No swelling, tenderness, numbness or tingling in his extremities. No new aches or pains.   No trouble swallowing. His appetite is good and he is staying hydrated. His weight is stable.   Medications:    Medication List       This list is accurate as of: 04/09/15 10:39 AM.  Always use your most recent med list.               ALIGN 4 MG Caps  Take by mouth every morning.     benzonatate 100 MG capsule  Commonly known as:  TESSALON PERLES  Take 1 capsule (100 mg total) by mouth 3 (three) times daily as needed for cough.     clonazePAM 0.5 MG tablet  Commonly known as:  KLONOPIN  Take 1 tablet (0.5 mg total) by mouth 3 (three) times daily as needed (anxiety).     dexamethasone 4 MG tablet  Commonly known as:  DECADRON  Take 8 mg by mouth 2 (two) times daily with a meal. Start the day after chemo for three days     diphenoxylate-atropine 2.5-0.025 MG per tablet  Commonly known as:  LOMOTIL  Take 1 tablet by mouth 4 (four) times daily as needed for diarrhea or loose stools.     dronabinol 5 MG capsule  Commonly known as:  MARINOL  Take 1 capsule (5 mg total) by mouth 2 (two) times daily before a meal.     lidocaine-prilocaine cream  Commonly known as:  EMLA  Apply 1  application topically as needed.     magnesium oxide 400 (241.3 MG) MG tablet  Commonly known as:  MAG-OX  Take 1 tablet (400 mg total) by mouth 2 (two) times daily.     MULTIVITAMIN GUMMIES ADULT PO  Take by mouth every morning.     omeprazole 40 MG capsule  Commonly known as:  PRILOSEC  Take 1 capsule (40 mg total) by mouth daily.     potassium chloride SA 20 MEQ tablet  Commonly known as:  K-DUR,KLOR-CON  Take 20 mEq by mouth daily.     prochlorperazine 10 MG tablet  Commonly known as:  COMPAZINE  Take 10 mg by mouth as needed for nausea or vomiting.     propranolol ER 60 MG 24 hr capsule  Commonly known as:  INDERAL LA  Take 1 capsule (60 mg total) by mouth at bedtime.     pyridOXINE 100 MG tablet  Commonly known as:  VITAMIN B-6  Take 2 pills at one time once a day.     SUPER B COMPLEX PO  Take 1 tablet by mouth daily.     traZODone 50 MG tablet  Commonly known as:  DESYREL  Take 1 tablet (50 mg total) by mouth at bedtime.  venlafaxine 37.5 MG tablet  Commonly known as:  EFFEXOR  TAKE 1 TABLET BY MOUTH TWICE DAILY     Vitamin D 2000 UNITS tablet  Take 2,000 Units by mouth daily.        Allergies:  Allergies  Allergen Reactions  . Penicillins Other (See Comments)    Hallucinations, from childhood    Past Medical History, Surgical history, Social history, and Family History were reviewed and updated.  Review of Systems: All other 10 point review of systems is negative.   Physical Exam:  weight is 201 lb (91.173 kg). His oral temperature is 97.3 F (36.3 C). His blood pressure is 136/71 and his pulse is 69. His respiration is 18.   Wt Readings from Last 3 Encounters:  04/09/15 201 lb (91.173 kg)  03/26/15 198 lb 6.4 oz (89.994 kg)  03/20/15 199 lb (90.266 kg)    Ocular: Sclerae unicteric, pupils equal, round and reactive to light Ear-nose-throat: Oropharynx clear, dentition fair Lymphatic: No cervical or supraclavicular adenopathy Lungs no  rales or rhonchi, good excursion bilaterally Heart regular rate and rhythm, no murmur appreciated Abd soft, nontender, positive bowel sounds MSK no focal spinal tenderness, no joint edema Neuro: non-focal, well-oriented, appropriate affect  Lab Results  Component Value Date   WBC 6.8 04/09/2015   HGB 13.4 04/09/2015   HCT 39.4 04/09/2015   MCV 93 04/09/2015   PLT 159 04/09/2015   Lab Results  Component Value Date   FERRITIN 35 10/03/2014   IRON 40* 10/03/2014   TIBC 279 10/03/2014   UIBC 239 10/03/2014   IRONPCTSAT 14* 10/03/2014   Lab Results  Component Value Date   RBC 4.23 04/09/2015   No results found for: KPAFRELGTCHN, LAMBDASER, KAPLAMBRATIO No results found for: IGGSERUM, IGA, IGMSERUM No results found for: Odetta Pink, SPEI   Chemistry      Component Value Date/Time   NA 141 04/09/2015 0912   NA 138 03/20/2015 1516   K 4.2 04/09/2015 0912   K 4.1 03/20/2015 1516   CL 104 04/09/2015 0912   CL 101 03/20/2015 1516   CO2 31 04/09/2015 0912   CO2 25 03/20/2015 1516   BUN 20 04/09/2015 0912   BUN 22 03/20/2015 1516   CREATININE 1.5* 04/09/2015 0912   CREATININE 1.04 03/20/2015 1516      Component Value Date/Time   CALCIUM 9.2 04/09/2015 0912   CALCIUM 8.9 03/20/2015 1516   ALKPHOS 72 04/09/2015 0912   ALKPHOS 64 03/20/2015 1516   AST 32 04/09/2015 0912   AST 20 03/20/2015 1516   ALT 22 04/09/2015 0912   ALT 13 03/20/2015 1516   BILITOT 0.80 04/09/2015 0912   BILITOT 0.6 03/20/2015 1516     Impression and Plan: Willie Owens is 64 year old gentleman with recurrent metastatic adenocarcinoma of the distal esophagus. He did very well with cycle 1 of Cyramza. His nausea and diarrhea have been effectively controlled with medication. He has had no neurological side effects so far.  We will proceed with cycle 2 of treatment today as planned. With the goal of completing 4 cycles and then repeating a PET scan.    He has his appointment schedule.  Both he and his wife know to call here with any questions or concerns. We can certainly see him sooner if need be.   Eliezer Bottom, NP 4/13/201610:39 AM

## 2015-04-23 ENCOUNTER — Ambulatory Visit (HOSPITAL_BASED_OUTPATIENT_CLINIC_OR_DEPARTMENT_OTHER): Payer: 59 | Admitting: Hematology & Oncology

## 2015-04-23 ENCOUNTER — Ambulatory Visit (HOSPITAL_BASED_OUTPATIENT_CLINIC_OR_DEPARTMENT_OTHER): Payer: 59

## 2015-04-23 ENCOUNTER — Other Ambulatory Visit (HOSPITAL_BASED_OUTPATIENT_CLINIC_OR_DEPARTMENT_OTHER): Payer: 59

## 2015-04-23 ENCOUNTER — Encounter: Payer: Self-pay | Admitting: Hematology & Oncology

## 2015-04-23 VITALS — BP 138/59 | HR 63 | Temp 97.0°F | Resp 18 | Ht 77.0 in | Wt 202.0 lb

## 2015-04-23 VITALS — BP 126/61 | HR 61 | Resp 16

## 2015-04-23 DIAGNOSIS — C159 Malignant neoplasm of esophagus, unspecified: Secondary | ICD-10-CM

## 2015-04-23 DIAGNOSIS — C155 Malignant neoplasm of lower third of esophagus: Secondary | ICD-10-CM

## 2015-04-23 DIAGNOSIS — Z5111 Encounter for antineoplastic chemotherapy: Secondary | ICD-10-CM | POA: Diagnosis not present

## 2015-04-23 LAB — CBC WITH DIFFERENTIAL (CANCER CENTER ONLY)
BASO#: 0 10*3/uL (ref 0.0–0.2)
BASO%: 0.3 % (ref 0.0–2.0)
EOS%: 1.9 % (ref 0.0–7.0)
Eosinophils Absolute: 0.1 10*3/uL (ref 0.0–0.5)
HEMATOCRIT: 38.5 % — AB (ref 38.7–49.9)
HGB: 12.7 g/dL — ABNORMAL LOW (ref 13.0–17.1)
LYMPH#: 2.3 10*3/uL (ref 0.9–3.3)
LYMPH%: 36.4 % (ref 14.0–48.0)
MCH: 31.5 pg (ref 28.0–33.4)
MCHC: 33 g/dL (ref 32.0–35.9)
MCV: 96 fL (ref 82–98)
MONO#: 0.7 10*3/uL (ref 0.1–0.9)
MONO%: 10.5 % (ref 0.0–13.0)
NEUT#: 3.2 10*3/uL (ref 1.5–6.5)
NEUT%: 50.9 % (ref 40.0–80.0)
Platelets: 164 10*3/uL (ref 145–400)
RBC: 4.03 10*6/uL — ABNORMAL LOW (ref 4.20–5.70)
RDW: 14.7 % (ref 11.1–15.7)
WBC: 6.3 10*3/uL (ref 4.0–10.0)

## 2015-04-23 LAB — CMP (CANCER CENTER ONLY)
ALBUMIN: 3.2 g/dL — AB (ref 3.3–5.5)
ALT: 23 U/L (ref 10–47)
AST: 37 U/L (ref 11–38)
Alkaline Phosphatase: 61 U/L (ref 26–84)
BUN, Bld: 21 mg/dL (ref 7–22)
CALCIUM: 9.3 mg/dL (ref 8.0–10.3)
CO2: 28 meq/L (ref 18–33)
Chloride: 105 mEq/L (ref 98–108)
Creat: 1.3 mg/dl — ABNORMAL HIGH (ref 0.6–1.2)
Glucose, Bld: 106 mg/dL (ref 73–118)
Potassium: 4.1 mEq/L (ref 3.3–4.7)
SODIUM: 143 meq/L (ref 128–145)
TOTAL PROTEIN: 7.1 g/dL (ref 6.4–8.1)
Total Bilirubin: 0.7 mg/dl (ref 0.20–1.60)

## 2015-04-23 LAB — LACTATE DEHYDROGENASE: LDH: 146 U/L (ref 94–250)

## 2015-04-23 MED ORDER — HEPARIN SOD (PORK) LOCK FLUSH 100 UNIT/ML IV SOLN
500.0000 [IU] | Freq: Once | INTRAVENOUS | Status: AC | PRN
  Administered 2015-04-23: 500 [IU]
  Filled 2015-04-23: qty 5

## 2015-04-23 MED ORDER — ACETAMINOPHEN 325 MG PO TABS
ORAL_TABLET | ORAL | Status: AC
  Filled 2015-04-23: qty 2

## 2015-04-23 MED ORDER — DIPHENHYDRAMINE HCL 50 MG/ML IJ SOLN
50.0000 mg | Freq: Once | INTRAMUSCULAR | Status: AC
  Administered 2015-04-23: 50 mg via INTRAVENOUS

## 2015-04-23 MED ORDER — SODIUM CHLORIDE 0.9 % IJ SOLN
10.0000 mL | INTRAMUSCULAR | Status: DC | PRN
  Administered 2015-04-23: 10 mL
  Filled 2015-04-23: qty 10

## 2015-04-23 MED ORDER — SODIUM CHLORIDE 0.9 % IV SOLN
Freq: Once | INTRAVENOUS | Status: AC
Start: 2015-04-23 — End: 2015-04-23
  Administered 2015-04-23: 09:00:00 via INTRAVENOUS

## 2015-04-23 MED ORDER — OMEPRAZOLE 40 MG PO CPDR: 40.0000 mg | DELAYED_RELEASE_CAPSULE | Freq: Every day | ORAL | Status: DC

## 2015-04-23 MED ORDER — DIPHENHYDRAMINE HCL 50 MG/ML IJ SOLN
INTRAMUSCULAR | Status: AC
  Filled 2015-04-23: qty 1

## 2015-04-23 MED ORDER — ACETAMINOPHEN 325 MG PO TABS
650.0000 mg | ORAL_TABLET | Freq: Once | ORAL | Status: AC
  Administered 2015-04-23: 650 mg via ORAL

## 2015-04-23 MED ORDER — RAMUCIRUMAB CHEMO INJECTION 500 MG/50ML
8.0000 mg/kg | Freq: Once | INTRAVENOUS | Status: AC
  Administered 2015-04-23: 700 mg via INTRAVENOUS
  Filled 2015-04-23: qty 20

## 2015-04-23 NOTE — Progress Notes (Signed)
Hematology and Oncology Follow Up Visit  NYSHAWN GOWDY [id number] 07/21/1951 64 y.o. 04/23/2015   Principle Diagnosis:   Recurrent adenocarcinoma of the distal esophagus - HER2 negative  Current Therapy:    Status post 2 cycles of Cyramza     Interim History:  Mr. Elsass is back for follow-up. He is looking pretty good. He is swallowing okay. He's had a problem with pain. He has had some tingling in the hands or feet. He is on some vitamin B6.  He's had no cough. He's had no skin rashes.  He has had some diarrhea but Imodium helps with this.  He's had no bleeding. He had a little bit of blood per urine but this was transient.  Overall, his performance status is ECOG 1.  His mental status seems to be doing quite well. He is alert and oriented. There is no obvious confusion. He was     Medications:  Current outpatient prescriptions:  .  bismuth subsalicylate (PEPTO BISMOL) 262 MG/15ML suspension, Take 30 mLs by mouth every 6 (six) hours as needed for diarrhea or loose stools., Disp: , Rfl:  .  cetirizine (ZYRTEC) 10 MG tablet, Take 10 mg by mouth as needed for allergies., Disp: , Rfl:  .  diphenhydrAMINE (BENADRYL) 25 MG tablet, Take 25 mg by mouth every 6 (six) hours as needed for allergies., Disp: , Rfl:  .  B Complex-C (SUPER B COMPLEX PO), Take 1 tablet by mouth daily., Disp: , Rfl:  .  benzonatate (TESSALON PERLES) 100 MG capsule, Take 1 capsule (100 mg total) by mouth 3 (three) times daily as needed for cough., Disp: 90 capsule, Rfl: 0 .  Cholecalciferol (VITAMIN D) 2000 UNITS tablet, Take 2,000 Units by mouth daily., Disp: , Rfl:  .  dexamethasone (DECADRON) 4 MG tablet, Take 8 mg by mouth 2 (two) times daily with a meal. Start the day after chemo for three days, Disp: , Rfl:  .  diphenoxylate-atropine (LOMOTIL) 2.5-0.025 MG per tablet, Take 1 tablet by mouth 4 (four) times daily as needed for diarrhea or loose stools., Disp: 30 tablet, Rfl: 2 .  dronabinol  (MARINOL) 5 MG capsule, Take 1 capsule (5 mg total) by mouth 2 (two) times daily before a meal., Disp: 60 capsule, Rfl: 0 .  lidocaine-prilocaine (EMLA) cream, Apply 1 application topically as needed., Disp: 30 g, Rfl: 10 .  magnesium oxide (MAG-OX) 400 (241.3 MG) MG tablet, Take 1 tablet (400 mg total) by mouth 2 (two) times daily., Disp: 60 tablet, Rfl: 3 .  Multiple Vitamins-Minerals (MULTIVITAMIN GUMMIES ADULT PO), Take by mouth every morning., Disp: , Rfl:  .  omeprazole (PRILOSEC) 40 MG capsule, Take 1 capsule (40 mg total) by mouth daily., Disp: 30 capsule, Rfl: 6 .  potassium chloride SA (K-DUR,KLOR-CON) 20 MEQ tablet, Take 20 mEq by mouth daily., Disp: , Rfl:  .  Probiotic Product (ALIGN) 4 MG CAPS, Take by mouth every morning., Disp: , Rfl:  .  prochlorperazine (COMPAZINE) 10 MG tablet, Take 10 mg by mouth as needed for nausea or vomiting. , Disp: , Rfl:  .  propranolol ER (INDERAL LA) 60 MG 24 hr capsule, Take 1 capsule (60 mg total) by mouth at bedtime., Disp: 30 capsule, Rfl: 6 .  pyridOXINE (VITAMIN B-6) 100 MG tablet, Take 2 pills at one time once a day., Disp: 60 tablet, Rfl: 6 .  traZODone (DESYREL) 50 MG tablet, Take 1 tablet (50 mg total) by mouth at bedtime., Disp: 30 tablet, Rfl:  3 .  venlafaxine (EFFEXOR) 37.5 MG tablet, TAKE 1 TABLET BY MOUTH TWICE DAILY, Disp: 60 tablet, Rfl: 0  Allergies:  Allergies  Allergen Reactions  . Penicillins Other (See Comments)    Hallucinations, from childhood    Past Medical History, Surgical history, Social history, and Family History were reviewed and updated.  Review of Systems: As above  Physical Exam:  height is 6' 5"  (1.956 m) and weight is 202 lb (91.627 kg). His oral temperature is 97 F (36.1 C). His blood pressure is 138/59 and his pulse is 63. His respiration is 18.   Wt Readings from Last 3 Encounters:  04/23/15 202 lb (91.627 kg)  04/09/15 201 lb (91.173 kg)  03/26/15 198 lb 6.4 oz (89.994 kg)     Well-developed  and well-nourished white gym in no obvious distress. Head and neck exam shows no ocular or oral lesions. There are no palpable cervical or supraclavicular lymph nodes. Lungs are clear. Cardiac exam regular rate and rhythm with no murmurs, rubs or bruits. Abdomen is soft. There is no palpable abdominal mass. There is no fluid wave. There is no guarding or rebound tenderness. There is no palpable liver or spleen tip. Back exam shows no tenderness over the spine, ribs or hips. Extremities shows no clubbing, cyanosis or edema. He has good strength in his joints. He has good range of motion of his joints. Neurological exam is nonfocal. Skin exam no rashes, ecchymoses or petechia.  Lab Results  Component Value Date   WBC 6.3 04/23/2015   HGB 12.7* 04/23/2015   HCT 38.5* 04/23/2015   MCV 96 04/23/2015   PLT 164 04/23/2015     Chemistry      Component Value Date/Time   NA 141 04/09/2015 0912   NA 138 03/20/2015 1516   K 4.2 04/09/2015 0912   K 4.1 03/20/2015 1516   CL 104 04/09/2015 0912   CL 101 03/20/2015 1516   CO2 31 04/09/2015 0912   CO2 25 03/20/2015 1516   BUN 20 04/09/2015 0912   BUN 22 03/20/2015 1516   CREATININE 1.5* 04/09/2015 0912   CREATININE 1.04 03/20/2015 1516      Component Value Date/Time   CALCIUM 9.2 04/09/2015 0912   CALCIUM 8.9 03/20/2015 1516   ALKPHOS 72 04/09/2015 0912   ALKPHOS 64 03/20/2015 1516   AST 32 04/09/2015 0912   AST 20 03/20/2015 1516   ALT 22 04/09/2015 0912   ALT 13 03/20/2015 1516   BILITOT 0.80 04/09/2015 0912   BILITOT 0.6 03/20/2015 1516         Impression and Plan: Mr. Rappaport is 64 year old gentleman. He has metastatic adenocarcinoma of the distal esophagus. He actually responded fairly well to systemic chemotherapy. However, he had a incredibly unusual and rare neurological issue in which he had amnesia., Still not sure as to what was the "trigger".  I think he looks quite good. The Cyramza seems to be helping.  Again I want to do  for cycles and repeat his PET scan.  I will have him come back in 2 weeks for his fourth cycle. [name], MD 4/27/20168:37 AM

## 2015-04-23 NOTE — Patient Instructions (Signed)
Ramucirumab injection What is this medicine? RAMUCIRUMAB (ra mue SIR ue mab) is a chemotherapy drug. It is used to treat stomach cancer, colorectal cancer, or lung cancer. This drug targets a specific protein receptor on cancer cells and stops the cancer cells from growing. This medicine may be used for other purposes; ask your health care provider or pharmacist if you have questions. COMMON BRAND NAME(S): Cyramza What should I tell my health care provider before I take this medicine? They need to know if you have any of these conditions: -bleeding disorders -blood clots -heart disease, including heart failure, heart attack, or chest pain (angina) -high blood pressure -infection (especially a virus infection such as chickenpox, cold sores, or herpes) -protein in your urine -recent surgery -stroke -an unusual or allergic reaction to ramucirumab, other medicines, foods, dyes, or preservatives -pregnant or trying to get pregnant -breast-feeding How should I use this medicine? This medicine is for infusion into a vein. It is given by a health care professional in a hospital or clinic setting. Talk to your pediatrician regarding the use of this medicine in children. Special care may be needed. Overdosage: If you think you've taken too much of this medicine contact a poison control center or emergency room at once. Overdosage: If you think you have taken too much of this medicine contact a poison control center or emergency room at once. NOTE: This medicine is only for you. Do not share this medicine with others. What if I miss a dose? It is important not to miss your dose. Call your doctor or health care professional if you are unable to keep an appointment. What may interact with this medicine? Interactions have not been studied. This list may not describe all possible interactions. Give your health care provider a list of all the medicines, herbs, non-prescription drugs, or dietary  supplements you use. Also tell them if you smoke, drink alcohol, or use illegal drugs. Some items may interact with your medicine. What should I watch for while using this medicine? Your condition will be monitored carefully while you are receiving this medicine. You will need to to check your blood pressure and have your blood and urine tested while you are taking this medicine. Your condition will be monitored carefully while you are receiving this medicine. This medicine may increase your risk to bruise or bleed. Call your doctor or health care professional if you notice any unusual bleeding. This medicine may rarely cause 'gastrointestinal perforation' (holes in the stomach, intestines or colon), a serious side effect requiring surgery to repair. This medicine should be started at least 28 days following major surgery and the site of the surgery should be totally healed. Check with your doctor before scheduling dental work or surgery while you are receiving this treatment. Talk to your doctor if you have recently had surgery or if you have a wound that has not healed. Do not become pregnant while taking this medicine or for 3 months after stopping it. Women should inform their doctor if they wish to become pregnant or think they might be pregnant. There is a potential for serious side effects to an unborn child. Talk to your health care professional or pharmacist for more information. What side effects may I notice from receiving this medicine? Side effects that you should report to your doctor or health care professional as soon as possible: -allergic reactions like skin rash, itching or hives, breathing problems, swelling of the face, lips, or tongue -signs of infection - fever  or chills, cough, sore throat -chest pain or chest tightness -confusion -dizziness -feeling faint or lightheaded, falls -severe abdominal pain -severe nausea, vomiting -signs and symptoms of bleeding such as bloody or  black, tarry stools; red or dark-brown urine; spitting up blood or brown material that looks like coffee grounds; red spots on the skin; unusual bruising or bleeding from the eye, gums, or nose -signs and symptoms of a blood clot such as breathing problems; changes in vision; chest pain; severe, sudden headache; pain, swelling, warmth in the leg; trouble speaking; sudden numbness or weakness of the face, arm or leg -symptoms of a stroke: change in mental awareness, inability to talk or move one side of the body -trouble walking, dizziness, loss of balance or coordination Side effects that usually do not require medical attention (Report these to your doctor or health care professional if they continue or are bothersome.): -cold, clammy skin -constipation -diarrhea -headache -nausea, vomiting -stomach pain -unusually slow heartbeat -unusually weak or tired This list may not describe all possible side effects. Call your doctor for medical advice about side effects. You may report side effects to FDA at 1-800-FDA-1088. Where should I keep my medicine? This drug is given in a hospital or clinic and will not be stored at home. NOTE: This sheet is a summary. It may not cover all possible information. If you have questions about this medicine, talk to your doctor, pharmacist, or health care provider.  2015, [employer] Standard. (2014-04-23 13:05:27)

## 2015-05-07 ENCOUNTER — Other Ambulatory Visit (HOSPITAL_BASED_OUTPATIENT_CLINIC_OR_DEPARTMENT_OTHER): Payer: 59

## 2015-05-07 ENCOUNTER — Ambulatory Visit (HOSPITAL_BASED_OUTPATIENT_CLINIC_OR_DEPARTMENT_OTHER): Payer: 59 | Admitting: Hematology & Oncology

## 2015-05-07 ENCOUNTER — Ambulatory Visit (HOSPITAL_BASED_OUTPATIENT_CLINIC_OR_DEPARTMENT_OTHER): Payer: 59

## 2015-05-07 VITALS — BP 146/71 | HR 67 | Temp 97.8°F | Resp 16

## 2015-05-07 VITALS — BP 135/74 | HR 57 | Resp 18

## 2015-05-07 DIAGNOSIS — Z5112 Encounter for antineoplastic immunotherapy: Secondary | ICD-10-CM | POA: Diagnosis not present

## 2015-05-07 DIAGNOSIS — C155 Malignant neoplasm of lower third of esophagus: Secondary | ICD-10-CM | POA: Diagnosis not present

## 2015-05-07 DIAGNOSIS — C159 Malignant neoplasm of esophagus, unspecified: Secondary | ICD-10-CM

## 2015-05-07 LAB — CBC WITH DIFFERENTIAL (CANCER CENTER ONLY)
BASO#: 0 10*3/uL (ref 0.0–0.2)
BASO%: 0.4 % (ref 0.0–2.0)
EOS ABS: 0.2 10*3/uL (ref 0.0–0.5)
EOS%: 2.6 % (ref 0.0–7.0)
HCT: 36.7 % — ABNORMAL LOW (ref 38.7–49.9)
HEMOGLOBIN: 12.6 g/dL — AB (ref 13.0–17.1)
LYMPH#: 2.1 10*3/uL (ref 0.9–3.3)
LYMPH%: 37.5 % (ref 14.0–48.0)
MCH: 32.4 pg (ref 28.0–33.4)
MCHC: 34.3 g/dL (ref 32.0–35.9)
MCV: 94 fL (ref 82–98)
MONO#: 0.6 10*3/uL (ref 0.1–0.9)
MONO%: 10.2 % (ref 0.0–13.0)
NEUT#: 2.8 10*3/uL (ref 1.5–6.5)
NEUT%: 49.3 % (ref 40.0–80.0)
Platelets: 184 10*3/uL (ref 145–400)
RBC: 3.89 10*6/uL — AB (ref 4.20–5.70)
RDW: 15.3 % (ref 11.1–15.7)
WBC: 5.7 10*3/uL (ref 4.0–10.0)

## 2015-05-07 LAB — CMP (CANCER CENTER ONLY)
ALK PHOS: 51 U/L (ref 26–84)
ALT(SGPT): 25 U/L (ref 10–47)
AST: 38 U/L (ref 11–38)
Albumin: 3.2 g/dL — ABNORMAL LOW (ref 3.3–5.5)
BILIRUBIN TOTAL: 0.9 mg/dL (ref 0.20–1.60)
BUN, Bld: 21 mg/dL (ref 7–22)
CO2: 27 mEq/L (ref 18–33)
Calcium: 9.5 mg/dL (ref 8.0–10.3)
Chloride: 103 mEq/L (ref 98–108)
Creat: 1.4 mg/dl — ABNORMAL HIGH (ref 0.6–1.2)
Glucose, Bld: 97 mg/dL (ref 73–118)
Potassium: 4.5 mEq/L (ref 3.3–4.7)
Sodium: 140 mEq/L (ref 128–145)
Total Protein: 6.8 g/dL (ref 6.4–8.1)

## 2015-05-07 LAB — UA PROTEIN, DIPSTICK - CHCC SATELLITE: Protein, Urine: NEGATIVE mg/dL

## 2015-05-07 MED ORDER — DIPHENHYDRAMINE HCL 50 MG/ML IJ SOLN
INTRAMUSCULAR | Status: AC
  Filled 2015-05-07: qty 1

## 2015-05-07 MED ORDER — ACETAMINOPHEN 325 MG PO TABS
650.0000 mg | ORAL_TABLET | Freq: Once | ORAL | Status: AC
  Administered 2015-05-07: 650 mg via ORAL

## 2015-05-07 MED ORDER — HEPARIN SOD (PORK) LOCK FLUSH 100 UNIT/ML IV SOLN
500.0000 [IU] | Freq: Once | INTRAVENOUS | Status: AC | PRN
  Administered 2015-05-07: 500 [IU]
  Filled 2015-05-07: qty 5

## 2015-05-07 MED ORDER — SODIUM CHLORIDE 0.9 % IJ SOLN
10.0000 mL | INTRAMUSCULAR | Status: DC | PRN
  Administered 2015-05-07: 10 mL
  Filled 2015-05-07: qty 10

## 2015-05-07 MED ORDER — SODIUM CHLORIDE 0.9 % IV SOLN
8.0000 mg/kg | Freq: Once | INTRAVENOUS | Status: AC
  Administered 2015-05-07: 700 mg via INTRAVENOUS
  Filled 2015-05-07: qty 60

## 2015-05-07 MED ORDER — SODIUM CHLORIDE 0.9 % IJ SOLN
3.0000 mL | INTRAMUSCULAR | Status: DC | PRN
  Filled 2015-05-07: qty 10

## 2015-05-07 MED ORDER — SODIUM CHLORIDE 0.9 % IV SOLN
Freq: Once | INTRAVENOUS | Status: AC
  Administered 2015-05-07: 10:00:00 via INTRAVENOUS

## 2015-05-07 MED ORDER — ACETAMINOPHEN 325 MG PO TABS
ORAL_TABLET | ORAL | Status: AC
  Filled 2015-05-07: qty 2

## 2015-05-07 MED ORDER — HEPARIN SOD (PORK) LOCK FLUSH 100 UNIT/ML IV SOLN
250.0000 [IU] | Freq: Once | INTRAVENOUS | Status: DC | PRN
  Filled 2015-05-07: qty 5

## 2015-05-07 MED ORDER — ALTEPLASE 2 MG IJ SOLR
2.0000 mg | Freq: Once | INTRAMUSCULAR | Status: DC | PRN
  Filled 2015-05-07: qty 2

## 2015-05-07 MED ORDER — DIPHENHYDRAMINE HCL 50 MG/ML IJ SOLN
50.0000 mg | Freq: Once | INTRAMUSCULAR | Status: AC
  Administered 2015-05-07: 50 mg via INTRAVENOUS

## 2015-05-07 NOTE — Progress Notes (Signed)
Hematology and Oncology Follow Up Visit  TAG WURTZ [id number] [birth year] 64 y.o. 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 is back for follow-up. He is looking pretty good. He is swallowing okay. He is having issues with neuropathy in his feet. I have him on some vitamin B6. I am a little bit concerned about putting him on a neuroleptic agent. He's had these neurological issues in the past. I do not want to potentially exaggerate that.  He sees (around okay.  He is eating well. His appetite is good. He's had no change in bowel or bladder habits. He's had no rashes. He's had no leg swelling.  Overall, his performance status is ECOG 1.  The good news is that they have a new grandchild. She was born in [place] on Mother[place] Day.   Really not sure if she'll monitor the oh is good as  Medications:  Current outpatient prescriptions:  .  B Complex-C (SUPER B COMPLEX PO), Take 1 tablet by mouth daily., Disp: , Rfl:  .  benzonatate (TESSALON PERLES) 100 MG capsule, Take 1 capsule (100 mg total) by mouth 3 (three) times daily as needed for cough., Disp: 90 capsule, Rfl: 0 .  bismuth subsalicylate (PEPTO BISMOL) 262 MG/15ML suspension, Take 30 mLs by mouth every 6 (six) hours as needed for diarrhea or loose stools., Disp: , Rfl:  .  cetirizine (ZYRTEC) 10 MG tablet, Take 10 mg by mouth as needed for allergies., Disp: , Rfl:  .  Cholecalciferol (VITAMIN D) 2000 UNITS tablet, Take 2,000 Units by mouth daily., Disp: , Rfl:  .  dexamethasone (DECADRON) 4 MG tablet, Take 8 mg by mouth 2 (two) times daily with a meal. Start the day after chemo for three days, Disp: , Rfl:  .  diphenhydrAMINE (BENADRYL) 25 MG tablet, Take 25 mg by mouth every 6 (six) hours as needed for allergies., Disp: , Rfl:  .  diphenoxylate-atropine (LOMOTIL) 2.5-0.025 MG per  tablet, Take 1 tablet by mouth 4 (four) times daily as needed for diarrhea or loose stools., Disp: 30 tablet, Rfl: 2 .  dronabinol (MARINOL) 5 MG capsule, Take 1 capsule (5 mg total) by mouth 2 (two) times daily before a meal., Disp: 60 capsule, Rfl: 0 .  lidocaine-prilocaine (EMLA) cream, Apply 1 application topically as needed., Disp: 30 g, Rfl: 10 .  magnesium oxide (MAG-OX) 400 (241.3 MG) MG tablet, Take 1 tablet (400 mg total) by mouth 2 (two) times daily., Disp: 60 tablet, Rfl: 3 .  Multiple Vitamins-Minerals (MULTIVITAMIN GUMMIES ADULT PO), Take by mouth every morning., Disp: , Rfl:  .  omeprazole (PRILOSEC) 40 MG capsule, Take 1 capsule (40 mg total) by mouth daily., Disp: 30 capsule, Rfl: 6 .  potassium chloride SA (K-DUR,KLOR-CON) 20 MEQ tablet, Take 20 mEq by mouth daily., Disp: , Rfl:  .  Probiotic Product (ALIGN) 4 MG CAPS, Take by mouth every morning., Disp: , Rfl:  .  prochlorperazine (COMPAZINE) 10 MG tablet, Take 10 mg by mouth as needed for nausea or vomiting. , Disp: , Rfl:  .  propranolol ER (INDERAL LA) 60 MG 24 hr capsule, Take 1 capsule (60 mg total) by mouth at bedtime., Disp: 30 capsule, Rfl: 6 .  pyridOXINE (VITAMIN B-6) 100 MG tablet, Take 2 pills at one time once a day., Disp: 60 tablet,  Rfl: 6 .  traZODone (DESYREL) 50 MG tablet, Take 1 tablet (50 mg total) by mouth at bedtime., Disp: 30 tablet, Rfl: 3 .  venlafaxine (EFFEXOR) 37.5 MG tablet, TAKE 1 TABLET BY MOUTH TWICE DAILY, Disp: 60 tablet, Rfl: 0  Allergies:  Allergies  Allergen Reactions  . Penicillins Other (See Comments)    Hallucinations, from childhood    Past Medical History, Surgical history, Social history, and Family History were reviewed and updated.  Review of Systems: As above  Physical Exam:  oral temperature is 97.8 F (36.6 C). His blood pressure is 146/71 and his pulse is 67. His respiration is 16.   Wt Readings from Last 3 Encounters:  04/23/15 202 lb (91.627 kg)  04/09/15 201 lb  (91.173 kg)  03/26/15 198 lb 6.4 oz (89.994 kg)     Well-developed and well-nourished white gym in no obvious distress. Head and neck exam shows no ocular or oral lesions. There are no palpable cervical or supraclavicular lymph nodes. Lungs are clear. Cardiac exam regular rate and rhythm with no murmurs, rubs or bruits. Abdomen is soft. There is no palpable abdominal mass. There is no fluid wave. There is no guarding or rebound tenderness. There is no palpable liver or spleen tip. Back exam shows no tenderness over the spine, ribs or hips. Extremities shows no clubbing, cyanosis or edema. He has good strength in his joints. He has good range of motion of his joints. Neurological exam is nonfocal. Skin exam no rashes, ecchymoses or petechia.  Lab Results  Component Value Date   WBC 5.7 05/07/2015   HGB 12.6* 05/07/2015   HCT 36.7* 05/07/2015   MCV 94 05/07/2015   PLT 184 05/07/2015     Chemistry      Component Value Date/Time   NA 143 04/23/2015 0812   NA 138 03/20/2015 1516   K 4.1 04/23/2015 0812   K 4.1 03/20/2015 1516   CL 105 04/23/2015 0812   CL 101 03/20/2015 1516   CO2 28 04/23/2015 0812   CO2 25 03/20/2015 1516   BUN 21 04/23/2015 0812   BUN 22 03/20/2015 1516   CREATININE 1.3* 04/23/2015 0812   CREATININE 1.04 03/20/2015 1516      Component Value Date/Time   CALCIUM 9.3 04/23/2015 0812   CALCIUM 8.9 03/20/2015 1516   ALKPHOS 61 04/23/2015 0812   ALKPHOS 64 03/20/2015 1516   AST 37 04/23/2015 0812   AST 20 03/20/2015 1516   ALT 23 04/23/2015 0812   ALT 13 03/20/2015 1516   BILITOT 0.70 04/23/2015 0812   BILITOT 0.6 03/20/2015 1516         Impression and Plan: Willie Owens is 64 year old gentleman. He has metastatic adenocarcinoma of the distal esophagus. He actually responded fairly well to systemic chemotherapy. However, he had a incredibly unusual and rare neurological issue in which he had amnesia., Still not sure as to what was the "trigger".  I think he  looks quite good. The Cyramza seems to be helping.  Again I will get a PET scan after this cycle of treatment..  I will have him come back in 2 weeks for follow-up [name], MD 5/11/20169:40 AM

## 2015-05-07 NOTE — Patient Instructions (Signed)
Ramucirumab injection What is this medicine? RAMUCIRUMAB (ra mue SIR ue mab) is a chemotherapy drug. It is used to treat stomach cancer, colorectal cancer, or lung cancer. This drug targets a specific protein receptor on cancer cells and stops the cancer cells from growing. This medicine may be used for other purposes; ask your health care provider or pharmacist if you have questions. COMMON BRAND NAME(S): Cyramza What should I tell my health care provider before I take this medicine? They need to know if you have any of these conditions: -bleeding disorders -blood clots -heart disease, including heart failure, heart attack, or chest pain (angina) -high blood pressure -infection (especially a virus infection such as chickenpox, cold sores, or herpes) -protein in your urine -recent surgery -stroke -an unusual or allergic reaction to ramucirumab, other medicines, foods, dyes, or preservatives -pregnant or trying to get pregnant -breast-feeding How should I use this medicine? This medicine is for infusion into a vein. It is given by a health care professional in a hospital or clinic setting. Talk to your pediatrician regarding the use of this medicine in children. Special care may be needed. Overdosage: If you think you've taken too much of this medicine contact a poison control center or emergency room at once. Overdosage: If you think you have taken too much of this medicine contact a poison control center or emergency room at once. NOTE: This medicine is only for you. Do not share this medicine with others. What if I miss a dose? It is important not to miss your dose. Call your doctor or health care professional if you are unable to keep an appointment. What may interact with this medicine? Interactions have not been studied. This list may not describe all possible interactions. Give your health care provider a list of all the medicines, herbs, non-prescription drugs, or dietary  supplements you use. Also tell them if you smoke, drink alcohol, or use illegal drugs. Some items may interact with your medicine. What should I watch for while using this medicine? Your condition will be monitored carefully while you are receiving this medicine. You will need to to check your blood pressure and have your blood and urine tested while you are taking this medicine. Your condition will be monitored carefully while you are receiving this medicine. This medicine may increase your risk to bruise or bleed. Call your doctor or health care professional if you notice any unusual bleeding. This medicine may rarely cause 'gastrointestinal perforation' (holes in the stomach, intestines or colon), a serious side effect requiring surgery to repair. This medicine should be started at least 28 days following major surgery and the site of the surgery should be totally healed. Check with your doctor before scheduling dental work or surgery while you are receiving this treatment. Talk to your doctor if you have recently had surgery or if you have a wound that has not healed. Do not become pregnant while taking this medicine or for 3 months after stopping it. Women should inform their doctor if they wish to become pregnant or think they might be pregnant. There is a potential for serious side effects to an unborn child. Talk to your health care professional or pharmacist for more information. What side effects may I notice from receiving this medicine? Side effects that you should report to your doctor or health care professional as soon as possible: -allergic reactions like skin rash, itching or hives, breathing problems, swelling of the face, lips, or tongue -signs of infection - fever  or chills, cough, sore throat -chest pain or chest tightness -confusion -dizziness -feeling faint or lightheaded, falls -severe abdominal pain -severe nausea, vomiting -signs and symptoms of bleeding such as bloody or  black, tarry stools; red or dark-brown urine; spitting up blood or brown material that looks like coffee grounds; red spots on the skin; unusual bruising or bleeding from the eye, gums, or nose -signs and symptoms of a blood clot such as breathing problems; changes in vision; chest pain; severe, sudden headache; pain, swelling, warmth in the leg; trouble speaking; sudden numbness or weakness of the face, arm or leg -symptoms of a stroke: change in mental awareness, inability to talk or move one side of the body -trouble walking, dizziness, loss of balance or coordination Side effects that usually do not require medical attention (Report these to your doctor or health care professional if they continue or are bothersome.): -cold, clammy skin -constipation -diarrhea -headache -nausea, vomiting -stomach pain -unusually slow heartbeat -unusually weak or tired This list may not describe all possible side effects. Call your doctor for medical advice about side effects. You may report side effects to FDA at 1-800-FDA-1088. Where should I keep my medicine? This drug is given in a hospital or clinic and will not be stored at home. NOTE: This sheet is a summary. It may not cover all possible information. If you have questions about this medicine, talk to your doctor, pharmacist, or health care provider.  2015, [employer] Standard. (2014-04-23 13:05:27)

## 2015-05-19 ENCOUNTER — Encounter (HOSPITAL_COMMUNITY)
Admission: RE | Admit: 2015-05-19 | Discharge: 2015-05-19 | Disposition: A | Discharge status: Home / Self Care | Payer: 59 | Source: Ambulatory Visit | Attending: Hematology & Oncology | Admitting: Hematology & Oncology

## 2015-05-19 DIAGNOSIS — C159 Malignant neoplasm of esophagus, unspecified: Secondary | ICD-10-CM | POA: Insufficient documentation

## 2015-05-19 LAB — GLUCOSE, CAPILLARY: Glucose-Capillary: 95 mg/dL (ref 65–99)

## 2015-05-19 MED ORDER — FLUDEOXYGLUCOSE F - 18 (FDG) INJECTION
9.9200 | Freq: Once | INTRAVENOUS | Status: AC | PRN
  Administered 2015-05-19: 9.92 via INTRAVENOUS

## 2015-05-21 ENCOUNTER — Telehealth: Payer: Self-pay | Admitting: Hematology & Oncology

## 2015-05-21 ENCOUNTER — Ambulatory Visit (HOSPITAL_BASED_OUTPATIENT_CLINIC_OR_DEPARTMENT_OTHER): Payer: 59 | Admitting: Hematology & Oncology

## 2015-05-21 ENCOUNTER — Ambulatory Visit: Payer: 59

## 2015-05-21 ENCOUNTER — Other Ambulatory Visit (HOSPITAL_BASED_OUTPATIENT_CLINIC_OR_DEPARTMENT_OTHER): Payer: 59

## 2015-05-21 VITALS — BP 146/86 | HR 67 | Resp 18 | Ht 77.0 in | Wt 205.0 lb

## 2015-05-21 DIAGNOSIS — C155 Malignant neoplasm of lower third of esophagus: Secondary | ICD-10-CM | POA: Diagnosis not present

## 2015-05-21 DIAGNOSIS — C159 Malignant neoplasm of esophagus, unspecified: Secondary | ICD-10-CM

## 2015-05-21 LAB — COMPREHENSIVE METABOLIC PANEL
ALT: 21 U/L (ref 0–53)
AST: 29 U/L (ref 0–37)
Albumin: 3.8 g/dL (ref 3.5–5.2)
Alkaline Phosphatase: 54 U/L (ref 39–117)
BILIRUBIN TOTAL: 0.7 mg/dL (ref 0.2–1.2)
BUN: 24 mg/dL — AB (ref 6–23)
CO2: 27 meq/L (ref 19–32)
Calcium: 9.2 mg/dL (ref 8.4–10.5)
Chloride: 104 mEq/L (ref 96–112)
Creatinine, Ser: 1.41 mg/dL — ABNORMAL HIGH (ref 0.50–1.35)
Glucose, Bld: 108 mg/dL — ABNORMAL HIGH (ref 70–99)
POTASSIUM: 4.7 meq/L (ref 3.5–5.3)
Sodium: 138 mEq/L (ref 135–145)
Total Protein: 6.8 g/dL (ref 6.0–8.3)

## 2015-05-21 LAB — CBC WITH DIFFERENTIAL (CANCER CENTER ONLY)
BASO#: 0 10*3/uL (ref 0.0–0.2)
BASO%: 0.3 % (ref 0.0–2.0)
EOS%: 2.1 % (ref 0.0–7.0)
Eosinophils Absolute: 0.1 10*3/uL (ref 0.0–0.5)
HCT: 37.3 % — ABNORMAL LOW (ref 38.7–49.9)
HEMOGLOBIN: 12.6 g/dL — AB (ref 13.0–17.1)
LYMPH#: 1.9 10*3/uL (ref 0.9–3.3)
LYMPH%: 29.4 % (ref 14.0–48.0)
MCH: 32.3 pg (ref 28.0–33.4)
MCHC: 33.8 g/dL (ref 32.0–35.9)
MCV: 96 fL (ref 82–98)
MONO#: 0.6 10*3/uL (ref 0.1–0.9)
MONO%: 9.7 % (ref 0.0–13.0)
NEUT#: 3.7 10*3/uL (ref 1.5–6.5)
NEUT%: 58.5 % (ref 40.0–80.0)
Platelets: 156 10*3/uL (ref 145–400)
RBC: 3.9 10*6/uL — AB (ref 4.20–5.70)
RDW: 15.9 % — ABNORMAL HIGH (ref 11.1–15.7)
WBC: 6.3 10*3/uL (ref 4.0–10.0)

## 2015-05-21 NOTE — Telephone Encounter (Signed)
Case APPROVED: [id number] - CHEMO Valid: 06/02/2015 - 06/01/2016 Esophageal cancer - Primary ICD-9-150.9 ICD-10-C15.9    [id number] - CAMPTOSAR [id number] - ADRUCIL J0640 - LEUCOVORIN  P: [phone] F: [phone]

## 2015-05-22 NOTE — Progress Notes (Signed)
Hematology and Oncology Follow Up Visit  Willie Owens [id number] 10-22-1951 64 y.o. 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 He is looking pretty good. He is swallowing okay. He is having issues with neuropathy in his feet but this might be a little bit better.  Unfortunately, his PET scan does show that he has progressive disease. This is unfortunate. I thought that he would respond to the [place]. He has active lymphadenopathy. He has some nodules in his lung which appear to be new.   He is eating well. His appetite is good. He's had no change in bowel or bladder habits. He's had no rashes. He's had no leg swelling.  He's had no chest wall pain. He's had no shortness of breath. He's had no cough.  Overall, his performance status is ECOG 1.   Medications:  Current outpatient prescriptions:  .  B Complex-C (SUPER B COMPLEX PO), Take 1 tablet by mouth daily., Disp: , Rfl:  .  benzonatate (TESSALON PERLES) 100 MG capsule, Take 1 capsule (100 mg total) by mouth 3 (three) times daily as needed for cough., Disp: 90 capsule, Rfl: 0 .  bismuth subsalicylate (PEPTO BISMOL) 262 MG/15ML suspension, Take 30 mLs by mouth every 6 (six) hours as needed for diarrhea or loose stools., Disp: , Rfl:  .  cetirizine (ZYRTEC) 10 MG tablet, Take 10 mg by mouth as needed for allergies., Disp: , Rfl:  .  Cholecalciferol (VITAMIN D) 2000 UNITS tablet, Take 2,000 Units by mouth daily., Disp: , Rfl:  .  dexamethasone (DECADRON) 4 MG tablet, Take 8 mg by mouth 2 (two) times daily with a meal. Start the day after chemo for three days, Disp: , Rfl:  .  diphenhydrAMINE (BENADRYL) 25 MG tablet, Take 25 mg by mouth every 6 (six) hours as needed for allergies., Disp: , Rfl:  .  diphenoxylate-atropine (LOMOTIL) 2.5-0.025 MG per tablet, Take 1  tablet by mouth 4 (four) times daily as needed for diarrhea or loose stools., Disp: 30 tablet, Rfl: 2 .  dronabinol (MARINOL) 5 MG capsule, Take 1 capsule (5 mg total) by mouth 2 (two) times daily before a meal., Disp: 60 capsule, Rfl: 0 .  lidocaine-prilocaine (EMLA) cream, Apply 1 application topically as needed., Disp: 30 g, Rfl: 10 .  magnesium oxide (MAG-OX) 400 (241.3 MG) MG tablet, Take 1 tablet (400 mg total) by mouth 2 (two) times daily., Disp: 60 tablet, Rfl: 3 .  Multiple Vitamins-Minerals (MULTIVITAMIN GUMMIES ADULT PO), Take by mouth every morning., Disp: , Rfl:  .  omeprazole (PRILOSEC) 40 MG capsule, Take 1 capsule (40 mg total) by mouth daily., Disp: 30 capsule, Rfl: 6 .  potassium chloride SA (K-DUR,KLOR-CON) 20 MEQ tablet, Take 20 mEq by mouth daily., Disp: , Rfl:  .  Probiotic Product (ALIGN) 4 MG CAPS, Take by mouth every morning., Disp: , Rfl:  .  prochlorperazine (COMPAZINE) 10 MG tablet, Take 10 mg by mouth as needed for nausea or vomiting. , Disp: , Rfl:  .  propranolol ER (INDERAL LA) 60 MG 24 hr capsule, Take 1 capsule (60 mg total) by mouth at bedtime., Disp: 30 capsule, Rfl: 6 .  pyridOXINE (VITAMIN B-6) 100 MG tablet, Take 2 pills at one time once a day., Disp: 60 tablet, Rfl: 6 .  traZODone (DESYREL)  50 MG tablet, Take 1 tablet (50 mg total) by mouth at bedtime., Disp: 30 tablet, Rfl: 3 .  venlafaxine (EFFEXOR) 37.5 MG tablet, TAKE 1 TABLET BY MOUTH TWICE DAILY, Disp: 60 tablet, Rfl: 0  Allergies:  Allergies  Allergen Reactions  . Penicillins Other (See Comments)    Hallucinations, from childhood    Past Medical History, Surgical history, Social history, and Family History were reviewed and updated.  Review of Systems: As above  Physical Exam:  height is _0  (1.956 m) and weight is 205 lb (92.987 kg). His blood pressure is 146/86 and his pulse is 67. His respiration is 18.   Wt Readings from Last 3 Encounters:  05/21/15 205 lb (92.987 kg)  04/23/15 202  lb (91.627 kg)  04/09/15 201 lb (91.173 kg)     Well-developed and well-nourished white gym in no obvious distress. Head and neck exam shows no ocular or oral lesions. There are no palpable cervical or supraclavicular lymph nodes. Lungs are clear. Cardiac exam regular rate and rhythm with no murmurs, rubs or bruits. Abdomen is soft. There is no palpable abdominal mass. There is no fluid wave. There is no guarding or rebound tenderness. There is no palpable liver or spleen tip. Back exam shows no tenderness over the spine, ribs or hips. Extremities shows no clubbing, cyanosis or edema. He has good strength in his joints. He has good range of motion of his joints. Neurological exam is nonfocal. Skin exam no rashes, ecchymoses or petechia.  Lab Results  Component Value Date   WBC 6.3 05/21/2015   HGB 12.6* 05/21/2015   HCT 37.3* 05/21/2015   MCV 96 05/21/2015   PLT 156 05/21/2015     Chemistry      Component Value Date/Time   NA 138 05/21/2015 1000   NA 140 05/07/2015 0843   K 4.7 05/21/2015 1000   K 4.5 05/07/2015 0843   CL 104 05/21/2015 1000   CL 103 05/07/2015 0843   CO2 27 05/21/2015 1000   CO2 27 05/07/2015 0843   BUN 24* 05/21/2015 1000   BUN 21 05/07/2015 0843   CREATININE 1.41* 05/21/2015 1000   CREATININE 1.4* 05/07/2015 0843      Component Value Date/Time   CALCIUM 9.2 05/21/2015 1000   CALCIUM 9.5 05/07/2015 0843   ALKPHOS 54 05/21/2015 1000   ALKPHOS 51 05/07/2015 0843   AST 29 05/21/2015 1000   AST 38 05/07/2015 0843   ALT 21 05/21/2015 1000   ALT 25 05/07/2015 0843   BILITOT 0.7 05/21/2015 1000   BILITOT 0.90 05/07/2015 0843         Impression and Plan: Willie Owens is 64 year old gentleman. He has metastatic adenocarcinoma of the distal esophagus. He actually responded fairly well to systemic chemotherapy. However, he had a incredibly unusual and rare neurological issue in which he had amnesia.,   I thought that we would get more mileage out of the  [place]. However, the PET scan clearly shows that he has progressive disease.  His performance status is still quite good. As such, I think that we could try him on systemic chemotherapy with FOLFIRI. I think that he would be able to tolerate this. I think that the neurological issues that he had with his initial chemotherapy were probably from the oxaliplatin.  I spoke to he and his wife about all this. They are ready to try some different. He and his wife will be going to [place] next week to visit family. We can  easily get started when he gets back. He already has a Port-A-Cath in place.  I went over the side effects of chemotherapy with he and his wife. I explained to them the possibility of diarrhea, weight loss, infection because of low white cells, and fatigue. The understand all of this. He wishes to proceed.  I will plan to see him back for the second cycle of treatment. I will plan for 4 cycles of treatment before I rescan him.   I will have him come back in 2 weeks for follow-up [name], MD 5/26/20168:56 AM

## 2015-05-29 ENCOUNTER — Ambulatory Visit: Payer: 59 | Admitting: Family Medicine

## 2015-06-02 ENCOUNTER — Ambulatory Visit (HOSPITAL_BASED_OUTPATIENT_CLINIC_OR_DEPARTMENT_OTHER): Payer: 59

## 2015-06-02 VITALS — BP 138/67 | HR 69 | Temp 97.6°F | Resp 18

## 2015-06-02 DIAGNOSIS — Z5111 Encounter for antineoplastic chemotherapy: Secondary | ICD-10-CM

## 2015-06-02 DIAGNOSIS — C155 Malignant neoplasm of lower third of esophagus: Secondary | ICD-10-CM | POA: Diagnosis not present

## 2015-06-02 DIAGNOSIS — C159 Malignant neoplasm of esophagus, unspecified: Secondary | ICD-10-CM

## 2015-06-02 MED ORDER — IRINOTECAN HCL CHEMO INJECTION 100 MG/5ML
178.0000 mg/m2 | Freq: Once | INTRAVENOUS | Status: AC
  Administered 2015-06-02: 400 mg via INTRAVENOUS
  Filled 2015-06-02: qty 20

## 2015-06-02 MED ORDER — LOPERAMIDE HCL 2 MG PO TABS: ORAL_TABLET | ORAL | Status: DC

## 2015-06-02 MED ORDER — ONDANSETRON HCL 8 MG PO TABS: 8.0000 mg | ORAL_TABLET | Freq: Two times a day (BID) | ORAL | Status: DC

## 2015-06-02 MED ORDER — LORAZEPAM 0.5 MG PO TABS: 0.5000 mg | ORAL_TABLET | Freq: Four times a day (QID) | ORAL | Status: DC | PRN

## 2015-06-02 MED ORDER — ATROPINE SULFATE 1 MG/ML IJ SOLN
INTRAMUSCULAR | Status: AC
  Filled 2015-06-02: qty 1

## 2015-06-02 MED ORDER — PROCHLORPERAZINE MALEATE 10 MG PO TABS: 10.0000 mg | ORAL_TABLET | Freq: Four times a day (QID) | ORAL | Status: DC | PRN

## 2015-06-02 MED ORDER — ATROPINE SULFATE 1 MG/ML IJ SOLN
0.5000 mg | Freq: Once | INTRAMUSCULAR | Status: AC | PRN
  Administered 2015-06-02: 0.5 mg via INTRAVENOUS

## 2015-06-02 MED ORDER — SODIUM CHLORIDE 0.9 % IV SOLN
2225.0000 mg/m2 | INTRAVENOUS | Status: DC
  Administered 2015-06-02: 5000 mg via INTRAVENOUS
  Filled 2015-06-02: qty 100

## 2015-06-02 MED ORDER — DEXTROSE 5 % IV SOLN
400.0000 mg/m2 | Freq: Once | INTRAVENOUS | Status: AC
  Administered 2015-06-02: 900 mg via INTRAVENOUS
  Filled 2015-06-02: qty 45

## 2015-06-02 MED ORDER — SODIUM CHLORIDE 0.9 % IV SOLN
Freq: Once | INTRAVENOUS | Status: AC
  Administered 2015-06-02: 10:00:00 via INTRAVENOUS

## 2015-06-02 MED ORDER — SODIUM CHLORIDE 0.9 % IV SOLN
Freq: Once | INTRAVENOUS | Status: AC
  Administered 2015-06-02: 10:00:00 via INTRAVENOUS
  Filled 2015-06-02: qty 8

## 2015-06-02 MED ORDER — FLUOROURACIL CHEMO INJECTION 2.5 GM/50ML
400.0000 mg/m2 | Freq: Once | INTRAVENOUS | Status: AC
  Administered 2015-06-02: 900 mg via INTRAVENOUS
  Filled 2015-06-02: qty 18

## 2015-06-02 MED ORDER — DEXAMETHASONE 4 MG PO TABS: 8.0000 mg | ORAL_TABLET | Freq: Two times a day (BID) | ORAL | Status: DC

## 2015-06-02 NOTE — Patient Instructions (Signed)
Leucovorin injection What is this medicine? LEUCOVORIN (loo koe VOR in) is used to prevent or treat the harmful effects of some medicines. This medicine is used to treat anemia caused by a low amount of folic acid in the body. It is also used with 5-fluorouracil (5-FU) to treat colon cancer. This medicine may be used for other purposes; ask your health care provider or pharmacist if you have questions. What should I tell my health care provider before I take this medicine? They need to know if you have any of these conditions: -anemia from low levels of vitamin B-12 in the blood -an unusual or allergic reaction to leucovorin, folic acid, other medicines, foods, dyes, or preservatives -pregnant or trying to get pregnant -breast-feeding How should I use this medicine? This medicine is for injection into a muscle or into a vein. It is given by a health care professional in a hospital or clinic setting. Talk to your pediatrician regarding the use of this medicine in children. Special care may be needed. Overdosage: If you think you have taken too much of this medicine contact a poison control center or emergency room at once. NOTE: This medicine is only for you. Do not share this medicine with others. What if I miss a dose? This does not apply. What may interact with this medicine? -capecitabine -fluorouracil -phenobarbital -phenytoin -primidone -trimethoprim-sulfamethoxazole This list may not describe all possible interactions. Give your health care provider a list of all the medicines, herbs, non-prescription drugs, or dietary supplements you use. Also tell them if you smoke, drink alcohol, or use illegal drugs. Some items may interact with your medicine. What should I watch for while using this medicine? Your condition will be monitored carefully while you are receiving this medicine. This medicine may increase the side effects of 5-fluorouracil, 5-FU. Tell your doctor or health care  professional if you have diarrhea or mouth sores that do not get better or that get worse. What side effects may I notice from receiving this medicine? Side effects that you should report to your doctor or health care professional as soon as possible: -allergic reactions like skin rash, itching or hives, swelling of the face, lips, or tongue -breathing problems -fever, infection -mouth sores -unusual bleeding or bruising -unusually weak or tired Side effects that usually do not require medical attention (report to your doctor or health care professional if they continue or are bothersome): -constipation or diarrhea -loss of appetite -nausea, vomiting This list may not describe all possible side effects. Call your doctor for medical advice about side effects. You may report side effects to FDA at 1-800-FDA-1088. Where should I keep my medicine? This drug is given in a hospital or clinic and will not be stored at home. NOTE: This sheet is a summary. It may not cover all possible information. If you have questions about this medicine, talk to your doctor, pharmacist, or health care provider.  2015, Elsevier/Gold Standard. (2008-06-18 16:50:29)   Irinotecan injection What is this medicine? IRINOTECAN (ir in oh TEE kan ) is a chemotherapy drug. It is used to treat colon and rectal cancer. This medicine may be used for other purposes; ask your health care provider or pharmacist if you have questions. COMMON BRAND NAME(S): Camptosar What should I tell my health care provider before I take this medicine? They need to know if you have any of these conditions: -blood disorders -dehydration -diarrhea -infection (especially a virus infection such as chickenpox, cold sores, or herpes) -liver disease -low  blood counts, like low white cell, platelet, or red cell counts -recent or ongoing radiation therapy -an unusual or allergic reaction to irinotecan, sorbitol, other chemotherapy, other  medicines, foods, dyes, or preservatives -pregnant or trying to get pregnant -breast-feeding How should I use this medicine? This drug is given as an infusion into a vein. It is administered in a hospital or clinic by a specially trained health care professional. Talk to your pediatrician regarding the use of this medicine in children. Special care may be needed. Overdosage: If you think you have taken too much of this medicine contact a poison control center or emergency room at once. NOTE: This medicine is only for you. Do not share this medicine with others. What if I miss a dose? It is important not to miss your dose. Call your doctor or health care professional if you are unable to keep an appointment. What may interact with this medicine? Do not take this medicine with any of the following medications: -atazanavir -certain medicines for fungal infections like itraconazole and ketoconazole -St. John's Wort This medicine may also interact with the following medications: -dexamethasone -diuretics -laxatives -medicines for seizures like carbamazepine, mephobarbital, phenobarbital, phenytoin, primidone -medicines to increase blood counts like filgrastim, pegfilgrastim, sargramostim -prochlorperazine -vaccines This list may not describe all possible interactions. Give your health care provider a list of all the medicines, herbs, non-prescription drugs, or dietary supplements you use. Also tell them if you smoke, drink alcohol, or use illegal drugs. Some items may interact with your medicine. What should I watch for while using this medicine? Your condition will be monitored carefully while you are receiving this medicine. You will need important blood work done while you are taking this medicine. This drug may make you feel generally unwell. This is not uncommon, as chemotherapy can affect healthy cells as well as cancer cells. Report any side effects. Continue your course of treatment even  though you feel ill unless your doctor tells you to stop. In some cases, you may be given additional medicines to help with side effects. Follow all directions for their use. You may get drowsy or dizzy. Do not drive, use machinery, or do anything that needs mental alertness until you know how this medicine affects you. Do not stand or sit up quickly, especially if you are an older patient. This reduces the risk of dizzy or fainting spells. Call your doctor or health care professional for advice if you get a fever, chills or sore throat, or other symptoms of a cold or flu. Do not treat yourself. This drug decreases your body's ability to fight infections. Try to avoid being around people who are sick. This medicine may increase your risk to bruise or bleed. Call your doctor or health care professional if you notice any unusual bleeding. Be careful brushing and flossing your teeth or using a toothpick because you may get an infection or bleed more easily. If you have any dental work done, tell your dentist you are receiving this medicine. Avoid taking products that contain aspirin, acetaminophen, ibuprofen, naproxen, or ketoprofen unless instructed by your doctor. These medicines may hide a fever. Do not become pregnant while taking this medicine. Women should inform their doctor if they wish to become pregnant or think they might be pregnant. There is a potential for serious side effects to an unborn child. Talk to your health care professional or pharmacist for more information. Do not breast-feed an infant while taking this medicine. What side effects may I  notice from receiving this medicine? Side effects that you should report to your doctor or health care professional as soon as possible: -allergic reactions like skin rash, itching or hives, swelling of the face, lips, or tongue -low blood counts - this medicine may decrease the number of white blood cells, red blood cells and platelets. You may be  at increased risk for infections and bleeding. -signs of infection - fever or chills, cough, sore throat, pain or difficulty passing urine -signs of decreased platelets or bleeding - bruising, pinpoint red spots on the skin, black, tarry stools, blood in the urine -signs of decreased red blood cells - unusually weak or tired, fainting spells, lightheadedness -breathing problems -chest pain -diarrhea -feeling faint or lightheaded, falls -flushing, runny nose, sweating during infusion -mouth sores or pain -pain, swelling, redness or irritation where injected -pain, swelling, warmth in the leg -pain, tingling, numbness in the hands or feet -problems with balance, talking, walking -stomach cramps, pain -trouble passing urine or change in the amount of urine -vomiting as to be unable to hold down drinks or food -yellowing of the eyes or skin Side effects that usually do not require medical attention (report to your doctor or health care professional if they continue or are bothersome): -constipation -hair loss -headache -loss of appetite -nausea, vomiting -stomach upset This list may not describe all possible side effects. Call your doctor for medical advice about side effects. You may report side effects to FDA at 1-800-FDA-1088. Where should I keep my medicine? This drug is given in a hospital or clinic and will not be stored at home. NOTE: This sheet is a summary. It may not cover all possible information. If you have questions about this medicine, talk to your doctor, pharmacist, or health care provider.  2015, Elsevier/Gold Standard. (2013-06-11 16:29:32)  Fluorouracil, 5FU; Diclofenac topical cream What is this medicine? FLUOROURACIL; DICLOFENAC (flure oh YOOR a sil; dye KLOE fen ak) is a combination of a topical chemotherapy agent and non-steroidal anti-inflammatory drug (NSAID). It is used on the skin to treat skin cancer and skin conditions that could become cancer. This  medicine may be used for other purposes; ask your health care provider or pharmacist if you have questions. COMMON BRAND NAME(S): FLUORAC What should I tell my health care provider before I take this medicine? They need to know if you have any of these conditions: -bleeding problems -cigarette smoker -DPD enzyme deficiency -heart disease -high blood pressure -if you frequently drink alcohol containing drinks -kidney disease -liver disease -open or infected skin -stomach problems -swelling or open sores at the treatment site -recent or planned coronary artery bypass graft (CABG) surgery -an unusual or allergic reaction to fluorouracil, diclofenac, aspirin, other NSAIDs, other medicines, foods, dyes, or preservatives -pregnant or trying to get pregnant -breast-feeding How should I use this medicine? This medicine is only for use on the skin. Follow the directions on the prescription label. Wash hands before and after use. Wash affected area and gently pat dry. To apply this medicine use a cotton-tipped applicator, or use gloves if applying with fingertips. If applied with unprotected fingertips, it is very important to wash your hands well after you apply this medicine. Avoid applying to the eyes, nose, or mouth. Apply enough medicine to cover the affected area. You can cover the area with a light gauze dressing, but do not use tight or air-tight dressings. Finish the full course prescribed by your doctor or health care professional, even if you think your  condition is better. Do not stop taking except on the advice of your doctor or health care professional. Talk to your pediatrician regarding the use of this medicine in children. Special care may be needed. Overdosage: If you think you've taken too much of this medicine contact a poison control center or emergency room at once. Overdosage: If you think you have taken too much of this medicine contact a poison control center or emergency room  at once. NOTE: This medicine is only for you. Do not share this medicine with others. What if I miss a dose? If you miss a dose, apply it as soon as you can. If it is almost time for your next dose, only use that dose. Do not apply extra doses. Contact your doctor or health care professional if you miss more than one dose. What may interact with this medicine? Interactions are not expected. Do not use any other skin products without telling your doctor or health care professional. This list may not describe all possible interactions. Give your health care provider a list of all the medicines, herbs, non-prescription drugs, or dietary supplements you use. Also tell them if you smoke, drink alcohol, or use illegal drugs. Some items may interact with your medicine. What should I watch for while using this medicine? Visit your doctor or health care professional for checks on your progress. You will need to use this medicine for 2 to 6 weeks. This may be longer depending on the condition being treated. You may not see full healing for another 1 to 2 months after you stop using the medicine. Treated areas of skin can look unsightly during and for several weeks after treatment with this medicine. This medicine can make you more sensitive to the sun. Keep out of the sun. If you cannot avoid being in the sun, wear protective clothing and use sunscreen. Do not use sun lamps or tanning beds/booths. Where should I keep my What side effects may I notice from receiving this medicine? Side effects that you should report to your doctor or health care professional as soon as possible: -allergic reactions like skin rash, itching or hives, swelling of the face, lips, or tongue -black or bloody stools, blood in the urine or vomit -blurred vision -chest pain -difficulty breathing or wheezing -redness, blistering, peeling or loosening of the skin, including inside the mouth -severe redness and swelling of normal  skin -slurred speech or weakness on one side of the body -trouble passing urine or change in the amount of urine -unexplained weight gain or swelling -unusually weak or tired -yellowing of eyes or skin Side effects that usually do not require medical attention (Report these to your doctor or health care professional if they continue or are bothersome.): -increased sensitivity of the skin to sun and ultraviolet light -pain and burning of the affected area -scaling or swelling of the affected area -skin rash, itching of the affected area -tenderness This list may not describe all possible side effects. Call your doctor for medical advice about side effects. You may report side effects to FDA at 1-800-FDA-1088. Where should I keep my medicine? Keep out of the reach of children. Store at room temperature between 20 and 25 degrees C (68 and 77 degrees F). Throw away any unused medicine after the expiration date. NOTE: This sheet is a summary. It may not cover all possible information. If you have questions about this medicine, talk to your doctor, pharmacist, or health care provider.  2015, Elsevier/Gold  Standard. (2014-04-15 11:09:58)

## 2015-06-04 ENCOUNTER — Ambulatory Visit (HOSPITAL_BASED_OUTPATIENT_CLINIC_OR_DEPARTMENT_OTHER): Payer: 59

## 2015-06-04 VITALS — BP 147/65 | HR 63 | Temp 97.9°F | Resp 18

## 2015-06-04 DIAGNOSIS — C159 Malignant neoplasm of esophagus, unspecified: Secondary | ICD-10-CM

## 2015-06-04 DIAGNOSIS — Z452 Encounter for adjustment and management of vascular access device: Secondary | ICD-10-CM

## 2015-06-04 DIAGNOSIS — C155 Malignant neoplasm of lower third of esophagus: Secondary | ICD-10-CM | POA: Diagnosis not present

## 2015-06-04 MED ORDER — HEPARIN SOD (PORK) LOCK FLUSH 100 UNIT/ML IV SOLN
500.0000 [IU] | Freq: Once | INTRAVENOUS | Status: AC | PRN
  Administered 2015-06-04: 500 [IU]
  Filled 2015-06-04: qty 5

## 2015-06-04 MED ORDER — SODIUM CHLORIDE 0.9 % IJ SOLN
10.0000 mL | INTRAMUSCULAR | Status: DC | PRN
  Administered 2015-06-04: 10 mL
  Filled 2015-06-04: qty 10

## 2015-06-04 NOTE — Patient Instructions (Signed)

## 2015-06-05 ENCOUNTER — Telehealth: Payer: Self-pay | Admitting: *Deleted

## 2015-06-05 NOTE — Telephone Encounter (Signed)
Patient is doing well. No complaints. No side effects at this time. He is taking his medications as prescribed. He had no problems with this new regimen including ambulatory pump.  He will call the office with any problems and/or questions.

## 2015-06-16 ENCOUNTER — Other Ambulatory Visit (HOSPITAL_BASED_OUTPATIENT_CLINIC_OR_DEPARTMENT_OTHER): Payer: 59

## 2015-06-16 ENCOUNTER — Ambulatory Visit (HOSPITAL_BASED_OUTPATIENT_CLINIC_OR_DEPARTMENT_OTHER): Payer: 59 | Admitting: Hematology & Oncology

## 2015-06-16 ENCOUNTER — Ambulatory Visit (HOSPITAL_BASED_OUTPATIENT_CLINIC_OR_DEPARTMENT_OTHER): Payer: 59

## 2015-06-16 ENCOUNTER — Telehealth: Payer: Self-pay

## 2015-06-16 ENCOUNTER — Telehealth: Payer: Self-pay | Admitting: Hematology & Oncology

## 2015-06-16 VITALS — BP 161/76 | HR 61 | Temp 97.4°F | Resp 20 | Wt 203.0 lb

## 2015-06-16 DIAGNOSIS — C155 Malignant neoplasm of lower third of esophagus: Secondary | ICD-10-CM

## 2015-06-16 DIAGNOSIS — G629 Polyneuropathy, unspecified: Secondary | ICD-10-CM

## 2015-06-16 DIAGNOSIS — C159 Malignant neoplasm of esophagus, unspecified: Secondary | ICD-10-CM

## 2015-06-16 DIAGNOSIS — E860 Dehydration: Secondary | ICD-10-CM | POA: Diagnosis not present

## 2015-06-16 LAB — CBC WITH DIFFERENTIAL (CANCER CENTER ONLY)
BASO#: 0 10*3/uL (ref 0.0–0.2)
BASO%: 1.4 % (ref 0.0–2.0)
EOS%: 5 % (ref 0.0–7.0)
Eosinophils Absolute: 0.1 10*3/uL (ref 0.0–0.5)
HEMATOCRIT: 36 % — AB (ref 38.7–49.9)
HGB: 12.3 g/dL — ABNORMAL LOW (ref 13.0–17.1)
LYMPH#: 2.1 10*3/uL (ref 0.9–3.3)
LYMPH%: 72.7 % — AB (ref 14.0–48.0)
MCH: 33.5 pg — ABNORMAL HIGH (ref 28.0–33.4)
MCHC: 34.2 g/dL (ref 32.0–35.9)
MCV: 98 fL (ref 82–98)
MONO#: 0.4 10*3/uL (ref 0.1–0.9)
MONO%: 14.9 % — AB (ref 0.0–13.0)
NEUT#: 0.2 10*3/uL — CL (ref 1.5–6.5)
NEUT%: 6 % — ABNORMAL LOW (ref 40.0–80.0)
Platelets: 138 10*3/uL — ABNORMAL LOW (ref 145–400)
RBC: 3.67 10*6/uL — ABNORMAL LOW (ref 4.20–5.70)
RDW: 15.7 % (ref 11.1–15.7)
WBC: 2.8 10*3/uL — ABNORMAL LOW (ref 4.0–10.0)

## 2015-06-16 LAB — CMP (CANCER CENTER ONLY)
ALK PHOS: 48 U/L (ref 26–84)
ALT(SGPT): 22 U/L (ref 10–47)
AST: 25 U/L (ref 11–38)
Albumin: 3.2 g/dL — ABNORMAL LOW (ref 3.3–5.5)
BUN: 26 mg/dL — AB (ref 7–22)
CHLORIDE: 103 meq/L (ref 98–108)
CO2: 27 mEq/L (ref 18–33)
Calcium: 8.8 mg/dL (ref 8.0–10.3)
Creat: 1.8 mg/dl — ABNORMAL HIGH (ref 0.6–1.2)
Glucose, Bld: 100 mg/dL (ref 73–118)
Potassium: 4.3 mEq/L (ref 3.3–4.7)
SODIUM: 139 meq/L (ref 128–145)
Total Bilirubin: 1 mg/dl (ref 0.20–1.60)
Total Protein: 6.5 g/dL (ref 6.4–8.1)

## 2015-06-16 LAB — LACTATE DEHYDROGENASE: LDH: 122 U/L (ref 94–250)

## 2015-06-16 MED ORDER — MAGNESIUM OXIDE 400 (241.3 MG) MG PO TABS: 400.0000 mg | ORAL_TABLET | Freq: Two times a day (BID) | ORAL | Status: DC

## 2015-06-16 MED ORDER — HEPARIN SOD (PORK) LOCK FLUSH 100 UNIT/ML IV SOLN
500.0000 [IU] | Freq: Once | INTRAVENOUS | Status: AC
  Administered 2015-06-16: 500 [IU] via INTRAVENOUS
  Filled 2015-06-16: qty 5

## 2015-06-16 MED ORDER — POTASSIUM CHLORIDE CRYS ER 20 MEQ PO TBCR: 20.0000 meq | EXTENDED_RELEASE_TABLET | Freq: Every day | ORAL | Status: DC

## 2015-06-16 MED ORDER — SODIUM CHLORIDE 0.9 % IJ SOLN
10.0000 mL | INTRAMUSCULAR | Status: DC | PRN
Start: 2015-06-16 — End: 2015-06-16
  Administered 2015-06-16: 10 mL via INTRAVENOUS
  Filled 2015-06-16: qty 10

## 2015-06-16 MED ORDER — SODIUM CHLORIDE 0.9 % IV SOLN
INTRAVENOUS | Status: DC
  Administered 2015-06-16: 13:00:00 via INTRAVENOUS

## 2015-06-16 NOTE — Progress Notes (Signed)
Dr Marin Olp aware of panic low neutrophils. dph

## 2015-06-16 NOTE — Progress Notes (Signed)
Hematology and Oncology Follow Up Visit  OTHER ATIENZA [id number] 09/07/1951 64 y.o. 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 Gingras is back for follow-up. He is looking pretty good. He is swallowing okay. He is having issues with neuropathy in his feet but this might be a little bit better.  He tolerated the first cycle of FOLFIRI pretty well.  He's had no issues with diarrhea. He did have some short-lived bouts of diarrhea but these were well-controlled.  His wife says that he is not drinking nearly enough fluid. I will give him some IV fluids today.  He's had no leg swelling.  He has had no neurological issues. There's been no problems with amnesia. had no cough.  Overall, his performance status is ECOG 1.   Medications:  Current outpatient prescriptions:  .  B Complex-C (SUPER B COMPLEX PO), Take 1 tablet by mouth daily., Disp: , Rfl:  .  benzonatate (TESSALON PERLES) 100 MG capsule, Take 1 capsule (100 mg total) by mouth 3 (three) times daily as needed for cough., Disp: 90 capsule, Rfl: 0 .  bismuth subsalicylate (PEPTO BISMOL) 262 MG/15ML suspension, Take 30 mLs by mouth every 6 (six) hours as needed for diarrhea or loose stools., Disp: , Rfl:  .  cetirizine (ZYRTEC) 10 MG tablet, Take 10 mg by mouth as needed for allergies., Disp: , Rfl:  .  Cholecalciferol (VITAMIN D) 2000 UNITS tablet, Take 2,000 Units by mouth daily., Disp: , Rfl:  .  dexamethasone (DECADRON) 4 MG tablet, Take 2 tablets (8 mg total) by mouth 2 (two) times daily with a meal. Start the day after chemotherapy for 3 days. Take with food., Disp: 30 tablet, Rfl: 1 .  diphenhydrAMINE (BENADRYL) 25 MG tablet, Take 25 mg by mouth every 6 (six) hours as needed for allergies., Disp: , Rfl:  .  diphenoxylate-atropine (LOMOTIL) 2.5-0.025 MG per tablet, Take 1 tablet by  mouth 4 (four) times daily as needed for diarrhea or loose stools., Disp: 30 tablet, Rfl: 2 .  dronabinol (MARINOL) 5 MG capsule, Take 1 capsule (5 mg total) by mouth 2 (two) times daily before a meal., Disp: 60 capsule, Rfl: 0 .  lidocaine-prilocaine (EMLA) cream, Apply 1 application topically as needed., Disp: 30 g, Rfl: 10 .  loperamide (IMODIUM A-D) 2 MG tablet, Take 2 at onset of diarrhea, then 1 every 2hrs until 12hr without a BM. May take 2 tab every 4hrs at bedtime. If diarrhea recurs repeat., Disp: 100 tablet, Rfl: 1 .  LORazepam (ATIVAN) 0.5 MG tablet, Take 1 tablet (0.5 mg total) by mouth every 6 (six) hours as needed for anxiety., Disp: 30 tablet, Rfl: 0 .  magnesium oxide (MAG-OX) 400 (241.3 MG) MG tablet, Take 1 tablet (400 mg total) by mouth 2 (two) times daily., Disp: 60 tablet, Rfl: 6 .  Multiple Vitamins-Minerals (MULTIVITAMIN GUMMIES ADULT PO), Take by mouth every morning., Disp: , Rfl:  .  omeprazole (PRILOSEC) 40 MG capsule, Take 1 capsule (40 mg total) by mouth daily., Disp: 30 capsule, Rfl: 6 .  ondansetron (ZOFRAN) 8 MG tablet, Take 1 tablet (8 mg total) by mouth 2 (two) times daily. Start the day after chemo for 3 days. Then take as needed for nausea or vomiting., Disp: 30 tablet, Rfl: 1 .  potassium chloride SA (K-DUR,KLOR-CON) 20 MEQ tablet, Take  1 tablet (20 mEq total) by mouth daily., Disp: 30 tablet, Rfl: 6 .  Probiotic Product (ALIGN) 4 MG CAPS, Take by mouth every morning., Disp: , Rfl:  .  prochlorperazine (COMPAZINE) 10 MG tablet, Take 10 mg by mouth as needed for nausea or vomiting. , Disp: , Rfl:  .  propranolol ER (INDERAL LA) 60 MG 24 hr capsule, Take 1 capsule (60 mg total) by mouth at bedtime., Disp: 30 capsule, Rfl: 6 .  pyridOXINE (VITAMIN B-6) 100 MG tablet, Take 2 pills at one time once a day., Disp: 60 tablet, Rfl: 6 .  traZODone (DESYREL) 50 MG tablet, Take 1 tablet (50 mg total) by mouth at bedtime., Disp: 30 tablet, Rfl: 3 .  venlafaxine (EFFEXOR) 37.5  MG tablet, TAKE 1 TABLET BY MOUTH TWICE DAILY, Disp: 60 tablet, Rfl: 0  Allergies:  Allergies  Allergen Reactions  . Penicillins Other (See Comments)    Hallucinations, from childhood    Past Medical History, Surgical history, Social history, and Family History were reviewed and updated.  Review of Systems: As above  Physical Exam:  weight is 203 lb (92.08 kg). His oral temperature is 97.4 F (36.3 C). His blood pressure is 161/76 and his pulse is 61. His respiration is 20.   Wt Readings from Last 3 Encounters:  06/16/15 203 lb (92.08 kg)  05/21/15 205 lb (92.987 kg)  04/23/15 202 lb (91.627 kg)     Well-developed and well-nourished white gym in no obvious distress. Head and neck exam shows no ocular or oral lesions. There are no palpable cervical or supraclavicular lymph nodes. Lungs are clear. Cardiac exam regular rate and rhythm with no murmurs, rubs or bruits. Abdomen is soft. There is no palpable abdominal mass. There is no fluid wave. There is no guarding or rebound tenderness. There is no palpable liver or spleen tip. Back exam shows no tenderness over the spine, ribs or hips. Extremities shows no clubbing, cyanosis or edema. He has good strength in his joints. He has good range of motion of his joints. Neurological exam is nonfocal. Skin exam no rashes, ecchymoses or petechia.  Lab Results  Component Value Date   WBC 2.8* 06/16/2015   HGB 12.3* 06/16/2015   HCT 36.0* 06/16/2015   MCV 98 06/16/2015   PLT 138* 06/16/2015     Chemistry      Component Value Date/Time   NA 139 06/16/2015 1127   NA 138 05/21/2015 1000   K 4.3 06/16/2015 1127   K 4.7 05/21/2015 1000   CL 103 06/16/2015 1127   CL 104 05/21/2015 1000   CO2 27 06/16/2015 1127   CO2 27 05/21/2015 1000   BUN 26* 06/16/2015 1127   BUN 24* 05/21/2015 1000   CREATININE 1.8* 06/16/2015 1127   CREATININE 1.41* 05/21/2015 1000      Component Value Date/Time   CALCIUM 8.8 06/16/2015 1127   CALCIUM 9.2  05/21/2015 1000   ALKPHOS 48 06/16/2015 1127   ALKPHOS 54 05/21/2015 1000   AST 25 06/16/2015 1127   AST 29 05/21/2015 1000   ALT 22 06/16/2015 1127   ALT 21 05/21/2015 1000   BILITOT 1.00 06/16/2015 1127   BILITOT 0.7 05/21/2015 1000         Impression and Plan: Mr. Kimrey is 64 year old gentleman. He has metastatic adenocarcinoma of the distal esophagus.  We cannot treat him today. His neutrophil count is just too low. Even though the monocytes were up, I still think that we would be "  tempting fate" by treating him.  We will give him IV fluids today.  I think his blood counts should be okay for his second cycle of treatment to be given next week.  I will still plan for 4 cycles of therapy and then we can rescan him.   I will have him come back in 2 weeks for follow-up\  [name], MD 6/20/20161:03 PM

## 2015-06-16 NOTE — Telephone Encounter (Signed)
Erroneous

## 2015-06-16 NOTE — Addendum Note (Signed)
Addended by: Burney Gauze R on: 06/16/2015 01:08 PM   Modules accepted: Orders, Medications

## 2015-06-16 NOTE — Patient Instructions (Signed)
Dehydration, Adult Dehydration is when you lose more fluids from the body than you take in. Vital organs like the kidneys, brain, and heart cannot function without a proper amount of fluids and salt. Any loss of fluids from the body can cause dehydration.  CAUSES   Vomiting.  Diarrhea.  Excessive sweating.  Excessive urine output.  Fever. SYMPTOMS  Mild dehydration  Thirst.  Dry lips.  Slightly dry mouth. Moderate dehydration  Very dry mouth.  Sunken eyes.  Skin does not bounce back quickly when lightly pinched and released.  Dark urine and decreased urine production.  Decreased tear production.  Headache. Severe dehydration  Very dry mouth.  Extreme thirst.  Rapid, weak pulse (more than 100 beats per minute at rest).  Cold hands and feet.  Not able to sweat in spite of heat and temperature.  Rapid breathing.  Blue lips.  Confusion and lethargy.  Difficulty being awakened.  Minimal urine production.  No tears. DIAGNOSIS  Your caregiver will diagnose dehydration based on your symptoms and your exam. Blood and urine tests will help confirm the diagnosis. The diagnostic evaluation should also identify the cause of dehydration. TREATMENT  Treatment of mild or moderate dehydration can often be done at home by increasing the amount of fluids that you drink. It is best to drink small amounts of fluid more often. Drinking too much at one time can make vomiting worse. Refer to the home care instructions below. Severe dehydration needs to be treated at the hospital where you will probably be given intravenous (IV) fluids that contain water and electrolytes. HOME CARE INSTRUCTIONS   Ask your caregiver about specific rehydration instructions.  Drink enough fluids to keep your urine clear or pale yellow.  Drink small amounts frequently if you have nausea and vomiting.  Eat as you normally do.  Avoid:  Foods or drinks high in sugar.  Carbonated  drinks.  Juice.  Extremely hot or cold fluids.  Drinks with caffeine.  Fatty, greasy foods.  Alcohol.  Tobacco.  Overeating.  Gelatin desserts.  Wash your hands well to avoid spreading bacteria and viruses.  Only take over-the-counter or prescription medicines for pain, discomfort, or fever as directed by your caregiver.  Ask your caregiver if you should continue all prescribed and over-the-counter medicines.  Keep all follow-up appointments with your caregiver. SEEK MEDICAL CARE IF:  You have abdominal pain and it increases or stays in one area (localizes).  You have a rash, stiff neck, or severe headache.  You are irritable, sleepy, or difficult to awaken.  You are weak, dizzy, or extremely thirsty. SEEK IMMEDIATE MEDICAL CARE IF:   You are unable to keep fluids down or you get worse despite treatment.  You have frequent episodes of vomiting or diarrhea.  You have blood or green matter (bile) in your vomit.  You have blood in your stool or your stool looks black and tarry.  You have not urinated in 6 to 8 hours, or you have only urinated a small amount of very dark urine.  You have a fever.  You faint. MAKE SURE YOU:   Understand these instructions.  Will watch your condition.  Will get help right away if you are not doing well or get worse. Document Released: 12/13/2005 Document Revised: 03/06/2012 Document Reviewed: 08/02/2011 ExitCare Patient Information 2015 [employer]. This information is not intended to replace advice given to you by your health care provider. Make sure you discuss any questions you have with your health care   provider.  

## 2015-06-16 NOTE — Telephone Encounter (Signed)
Spoke with pt's wife regarding 6/27 appt. Pt's wife confirmed appt.

## 2015-06-23 ENCOUNTER — Ambulatory Visit (HOSPITAL_BASED_OUTPATIENT_CLINIC_OR_DEPARTMENT_OTHER): Payer: 59

## 2015-06-23 ENCOUNTER — Other Ambulatory Visit (HOSPITAL_BASED_OUTPATIENT_CLINIC_OR_DEPARTMENT_OTHER): Payer: 59

## 2015-06-23 VITALS — BP 140/70 | HR 63 | Temp 97.7°F | Resp 18

## 2015-06-23 DIAGNOSIS — Z5111 Encounter for antineoplastic chemotherapy: Secondary | ICD-10-CM

## 2015-06-23 DIAGNOSIS — C155 Malignant neoplasm of lower third of esophagus: Secondary | ICD-10-CM

## 2015-06-23 DIAGNOSIS — C159 Malignant neoplasm of esophagus, unspecified: Secondary | ICD-10-CM

## 2015-06-23 LAB — CBC WITH DIFFERENTIAL (CANCER CENTER ONLY)
BASO#: 0.1 10*3/uL (ref 0.0–0.2)
BASO%: 1.5 % (ref 0.0–2.0)
EOS%: 2 % (ref 0.0–7.0)
Eosinophils Absolute: 0.1 10*3/uL (ref 0.0–0.5)
HEMATOCRIT: 38 % — AB (ref 38.7–49.9)
HEMOGLOBIN: 13.1 g/dL (ref 13.0–17.1)
LYMPH#: 2.1 10*3/uL (ref 0.9–3.3)
LYMPH%: 53.1 % — ABNORMAL HIGH (ref 14.0–48.0)
MCH: 33.8 pg — ABNORMAL HIGH (ref 28.0–33.4)
MCHC: 34.5 g/dL (ref 32.0–35.9)
MCV: 98 fL (ref 82–98)
MONO#: 0.5 10*3/uL (ref 0.1–0.9)
MONO%: 13.4 % — ABNORMAL HIGH (ref 0.0–13.0)
NEUT%: 30 % — ABNORMAL LOW (ref 40.0–80.0)
NEUTROS ABS: 1.2 10*3/uL — AB (ref 1.5–6.5)
Platelets: 183 10*3/uL (ref 145–400)
RBC: 3.88 10*6/uL — ABNORMAL LOW (ref 4.20–5.70)
RDW: 16.2 % — ABNORMAL HIGH (ref 11.1–15.7)
WBC: 4 10*3/uL (ref 4.0–10.0)

## 2015-06-23 LAB — CMP (CANCER CENTER ONLY)
ALT: 21 U/L (ref 10–47)
AST: 30 U/L (ref 11–38)
Albumin: 3.3 g/dL (ref 3.3–5.5)
Alkaline Phosphatase: 52 U/L (ref 26–84)
BILIRUBIN TOTAL: 1 mg/dL (ref 0.20–1.60)
BUN, Bld: 20 mg/dL (ref 7–22)
CALCIUM: 8.8 mg/dL (ref 8.0–10.3)
CO2: 25 meq/L (ref 18–33)
Chloride: 102 mEq/L (ref 98–108)
Creat: 1.4 mg/dl — ABNORMAL HIGH (ref 0.6–1.2)
Glucose, Bld: 107 mg/dL (ref 73–118)
Potassium: 4.3 mEq/L (ref 3.3–4.7)
Sodium: 139 mEq/L (ref 128–145)
Total Protein: 6.7 g/dL (ref 6.4–8.1)

## 2015-06-23 MED ORDER — SODIUM CHLORIDE 0.9 % IV SOLN
Freq: Once | INTRAVENOUS | Status: AC
  Administered 2015-06-23: 09:00:00 via INTRAVENOUS

## 2015-06-23 MED ORDER — ATROPINE SULFATE 1 MG/ML IJ SOLN
INTRAMUSCULAR | Status: AC
  Filled 2015-06-23: qty 1

## 2015-06-23 MED ORDER — DEXTROSE 5 % IV SOLN
400.0000 mg/m2 | Freq: Once | INTRAVENOUS | Status: AC
  Administered 2015-06-23: 900 mg via INTRAVENOUS
  Filled 2015-06-23: qty 45

## 2015-06-23 MED ORDER — FLUOROURACIL CHEMO INJECTION 5 GM/100ML
2225.0000 mg/m2 | INTRAVENOUS | Status: DC
  Administered 2015-06-23: 5000 mg via INTRAVENOUS
  Filled 2015-06-23: qty 100

## 2015-06-23 MED ORDER — ATROPINE SULFATE 1 MG/ML IJ SOLN
0.5000 mg | Freq: Once | INTRAMUSCULAR | Status: AC | PRN
  Administered 2015-06-23: 0.5 mg via INTRAVENOUS

## 2015-06-23 MED ORDER — SODIUM CHLORIDE 0.9 % IV SOLN
Freq: Once | INTRAVENOUS | Status: AC
  Administered 2015-06-23: 10:00:00 via INTRAVENOUS
  Filled 2015-06-23: qty 8

## 2015-06-23 MED ORDER — FLUOROURACIL CHEMO INJECTION 2.5 GM/50ML
400.0000 mg/m2 | Freq: Once | INTRAVENOUS | Status: AC
  Administered 2015-06-23: 900 mg via INTRAVENOUS
  Filled 2015-06-23: qty 18

## 2015-06-23 MED ORDER — DEXTROSE 5 % IV SOLN
142.0000 mg/m2 | Freq: Once | INTRAVENOUS | Status: AC
  Administered 2015-06-23: 320 mg via INTRAVENOUS
  Filled 2015-06-23: qty 16

## 2015-06-23 NOTE — Patient Instructions (Signed)
[facility] Discharge Instructions for Patients Receiving Chemotherapy  Today you received the following chemotherapy agents Camptosar, Leucovorin and 5FU.  To help prevent nausea and vomiting after your treatment, we encourage you to take your nausea medication.   If you develop nausea and vomiting that is not controlled by your nausea medication, call the clinic.   BELOW ARE SYMPTOMS THAT SHOULD BE REPORTED IMMEDIATELY:  *FEVER GREATER THAN 100.5 F  *CHILLS WITH OR WITHOUT FEVER  NAUSEA AND VOMITING THAT IS NOT CONTROLLED WITH YOUR NAUSEA MEDICATION  *UNUSUAL SHORTNESS OF BREATH  *UNUSUAL BRUISING OR BLEEDING  TENDERNESS IN MOUTH AND THROAT WITH OR WITHOUT PRESENCE OF ULCERS  *URINARY PROBLEMS  *BOWEL PROBLEMS  UNUSUAL RASH Items with * indicate a potential emergency and should be followed up as soon as possible.  Feel free to call the clinic you have any questions or concerns. The clinic phone number is [phone].  Please show the [facility] at check-in to the Emergency Department and triage nurse.

## 2015-06-25 ENCOUNTER — Telehealth: Payer: Self-pay | Admitting: Hematology & Oncology

## 2015-06-25 ENCOUNTER — Ambulatory Visit (HOSPITAL_BASED_OUTPATIENT_CLINIC_OR_DEPARTMENT_OTHER): Payer: 59

## 2015-06-25 VITALS — BP 139/68 | HR 64 | Temp 97.0°F | Resp 20

## 2015-06-25 DIAGNOSIS — C155 Malignant neoplasm of lower third of esophagus: Secondary | ICD-10-CM | POA: Diagnosis not present

## 2015-06-25 DIAGNOSIS — C159 Malignant neoplasm of esophagus, unspecified: Secondary | ICD-10-CM

## 2015-06-25 MED ORDER — HEPARIN SOD (PORK) LOCK FLUSH 100 UNIT/ML IV SOLN
500.0000 [IU] | Freq: Once | INTRAVENOUS | Status: AC | PRN
  Administered 2015-06-25: 500 [IU]
  Filled 2015-06-25: qty 5

## 2015-06-25 MED ORDER — SODIUM CHLORIDE 0.9 % IJ SOLN
10.0000 mL | INTRAMUSCULAR | Status: DC | PRN
  Administered 2015-06-25: 10 mL
  Filled 2015-06-25: qty 10

## 2015-06-25 NOTE — Patient Instructions (Signed)
Implanted Port Insertion, Care After Refer to this sheet in the next few weeks. These instructions provide you with information on caring for yourself after your procedure. Your health care provider may also give you more specific instructions. Your treatment has been planned according to current medical practices, but problems sometimes occur. Call your health care provider if you have any problems or questions after your procedure. WHAT TO EXPECT AFTER THE PROCEDURE After your procedure, it is typical to have the following:   Discomfort at the port insertion site. Ice packs to the area will help.  Bruising on the skin over the port. This will subside in 3-4 days. HOME CARE INSTRUCTIONS  After your port is placed, you will get a manufacturer's information card. The card has information about your port. Keep this card with you at all times.   Know what kind of port you have. There are many types of ports available.   Wear a medical alert bracelet in case of an emergency. This can help alert health care workers that you have a port.   The port can stay in for as long as your health care provider believes it is necessary.   A home health care nurse may give medicines and take care of the port.   You or a family member can get special training and directions for giving medicine and taking care of the port at home.  SEEK MEDICAL CARE IF:   Your port does not flush or you are unable to get a blood return.   You have a fever or chills. SEEK IMMEDIATE MEDICAL CARE IF:  You have new fluid or pus coming from your incision.   You notice a bad smell coming from your incision site.   You have swelling, pain, or more redness at the incision or port site.   You have chest pain or shortness of breath. Document Released: 10/03/2013 Document Revised: 12/18/2013 Document Reviewed: 10/03/2013 ExitCare Patient Information 2015 [employer]. This information is not intended to replace  advice given to you by your health care provider. Make sure you discuss any questions you have with your health care provider.  

## 2015-06-27 ENCOUNTER — Ambulatory Visit: Payer: 59

## 2015-07-07 ENCOUNTER — Encounter: Payer: Self-pay | Admitting: Hematology & Oncology

## 2015-07-07 ENCOUNTER — Ambulatory Visit (HOSPITAL_BASED_OUTPATIENT_CLINIC_OR_DEPARTMENT_OTHER): Payer: 59

## 2015-07-07 ENCOUNTER — Ambulatory Visit (HOSPITAL_BASED_OUTPATIENT_CLINIC_OR_DEPARTMENT_OTHER): Payer: 59 | Admitting: Hematology & Oncology

## 2015-07-07 ENCOUNTER — Other Ambulatory Visit (HOSPITAL_BASED_OUTPATIENT_CLINIC_OR_DEPARTMENT_OTHER): Payer: 59

## 2015-07-07 VITALS — BP 124/71 | HR 78 | Temp 98.7°F | Resp 18 | Ht 73.0 in | Wt 203.0 lb

## 2015-07-07 DIAGNOSIS — C159 Malignant neoplasm of esophagus, unspecified: Secondary | ICD-10-CM

## 2015-07-07 DIAGNOSIS — C155 Malignant neoplasm of lower third of esophagus: Secondary | ICD-10-CM

## 2015-07-07 DIAGNOSIS — D709 Neutropenia, unspecified: Secondary | ICD-10-CM | POA: Diagnosis not present

## 2015-07-07 LAB — CBC WITH DIFFERENTIAL (CANCER CENTER ONLY)
BASO#: 0 10*3/uL (ref 0.0–0.2)
BASO%: 0.8 % (ref 0.0–2.0)
EOS ABS: 0.1 10*3/uL (ref 0.0–0.5)
EOS%: 3.4 % (ref 0.0–7.0)
HEMATOCRIT: 37 % — AB (ref 38.7–49.9)
HGB: 12.7 g/dL — ABNORMAL LOW (ref 13.0–17.1)
LYMPH#: 1.7 10*3/uL (ref 0.9–3.3)
LYMPH%: 72.9 % — ABNORMAL HIGH (ref 14.0–48.0)
MCH: 34.5 pg — ABNORMAL HIGH (ref 28.0–33.4)
MCHC: 34.3 g/dL (ref 32.0–35.9)
MCV: 101 fL — AB (ref 82–98)
MONO#: 0.2 10*3/uL (ref 0.1–0.9)
MONO%: 8.1 % (ref 0.0–13.0)
NEUT#: 0.4 10*3/uL — CL (ref 1.5–6.5)
NEUT%: 14.8 % — AB (ref 40.0–80.0)
Platelets: 160 10*3/uL (ref 145–400)
RBC: 3.68 10*6/uL — ABNORMAL LOW (ref 4.20–5.70)
RDW: 16.1 % — AB (ref 11.1–15.7)
WBC: 2.4 10*3/uL — AB (ref 4.0–10.0)

## 2015-07-07 LAB — CMP (CANCER CENTER ONLY)
ALK PHOS: 48 U/L (ref 26–84)
ALT(SGPT): 23 U/L (ref 10–47)
AST: 29 U/L (ref 11–38)
Albumin: 3.5 g/dL (ref 3.3–5.5)
BUN, Bld: 15 mg/dL (ref 7–22)
CALCIUM: 9 mg/dL (ref 8.0–10.3)
CO2: 25 mEq/L (ref 18–33)
CREATININE: 1.4 mg/dL — AB (ref 0.6–1.2)
Chloride: 104 mEq/L (ref 98–108)
Glucose, Bld: 121 mg/dL — ABNORMAL HIGH (ref 73–118)
Potassium: 4.1 mEq/L (ref 3.3–4.7)
Sodium: 138 mEq/L (ref 128–145)
Total Bilirubin: 1 mg/dl (ref 0.20–1.60)
Total Protein: 6.9 g/dL (ref 6.4–8.1)

## 2015-07-07 LAB — LACTATE DEHYDROGENASE: LDH: 167 U/L (ref 94–250)

## 2015-07-07 LAB — CHCC SATELLITE - SMEAR

## 2015-07-07 MED ORDER — SODIUM CHLORIDE 0.9 % IV SOLN
Freq: Once | INTRAVENOUS | Status: DC
  Administered 2015-07-07: 10:00:00 via INTRAVENOUS

## 2015-07-07 MED ORDER — SODIUM CHLORIDE 0.9 % IV SOLN: Freq: Once | INTRAVENOUS | Status: DC

## 2015-07-07 MED ORDER — HEPARIN SOD (PORK) LOCK FLUSH 100 UNIT/ML IV SOLN
500.0000 [IU] | Freq: Once | INTRAVENOUS | Status: AC | PRN
  Administered 2015-07-07: 500 [IU]
  Filled 2015-07-07: qty 5

## 2015-07-07 MED ORDER — LEUCOVORIN CALCIUM INJECTION 350 MG: 400.0000 mg/m2 | Freq: Once | INTRAVENOUS | Status: DC

## 2015-07-07 MED ORDER — IRINOTECAN HCL CHEMO INJECTION 100 MG/5ML: 144.0000 mg/m2 | Freq: Once | INTRAVENOUS | Status: DC

## 2015-07-07 MED ORDER — SODIUM CHLORIDE 0.9 % IJ SOLN
10.0000 mL | INTRAMUSCULAR | Status: DC | PRN
  Administered 2015-07-07: 10 mL
  Filled 2015-07-07: qty 10

## 2015-07-07 MED ORDER — ATROPINE SULFATE 1 MG/ML IJ SOLN: 0.5000 mg | Freq: Once | INTRAMUSCULAR | Status: DC | PRN

## 2015-07-07 MED ORDER — FLUOROURACIL CHEMO INJECTION 2.5 GM/50ML: 400.0000 mg/m2 | Freq: Once | INTRAVENOUS | Status: DC

## 2015-07-07 MED ORDER — FLUOROURACIL CHEMO INJECTION 5 GM/100ML: 2200.0000 mg/m2 | INTRAVENOUS | Status: DC

## 2015-07-07 NOTE — Progress Notes (Signed)
Hematology and Oncology Follow Up Visit  JAEL KOSTICK [id number] Mar 13, 1951 64 y.o. 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 Pasion is back for follow-up. He is looking pretty good. He is swallowing okay.  He is wife discovered back from the [place]. He had a great time. He ate well. He did not go into the water. However, he was at the beach. He wore sunscreen.  His memory difficulties seem to be stabilizing.  There's been no pain. He's had no nausea or vomiting. He's had some diarrhea but does not take anything for this. I recommended Pepto-Bismol.  He's had no leg swelling.  He's had no rashes.  His been no cough or shortness of breath.  Overall, his performance status is ECOG 1.   Medications:  Current outpatient prescriptions:  .  B Complex-C (SUPER B COMPLEX PO), Take 1 tablet by mouth daily., Disp: , Rfl:  .  benzonatate (TESSALON PERLES) 100 MG capsule, Take 1 capsule (100 mg total) by mouth 3 (three) times daily as needed for cough., Disp: 90 capsule, Rfl: 0 .  bismuth subsalicylate (PEPTO BISMOL) 262 MG/15ML suspension, Take 30 mLs by mouth every 6 (six) hours as needed for diarrhea or loose stools., Disp: , Rfl:  .  cetirizine (ZYRTEC) 10 MG tablet, Take 10 mg by mouth as needed for allergies., Disp: , Rfl:  .  Cholecalciferol (VITAMIN D) 2000 UNITS tablet, Take 2,000 Units by mouth daily., Disp: , Rfl:  .  dexamethasone (DECADRON) 4 MG tablet, Take 2 tablets (8 mg total) by mouth 2 (two) times daily with a meal. Start the day after chemotherapy for 3 days. Take with food., Disp: 30 tablet, Rfl: 1 .  diphenhydrAMINE (BENADRYL) 25 MG tablet, Take 25 mg by mouth every 6 (six) hours as needed for allergies., Disp: , Rfl:  .  diphenoxylate-atropine (LOMOTIL) 2.5-0.025 MG per tablet, Take 1 tablet by mouth 4 (four) times daily as  needed for diarrhea or loose stools., Disp: 30 tablet, Rfl: 2 .  dronabinol (MARINOL) 5 MG capsule, Take 1 capsule (5 mg total) by mouth 2 (two) times daily before a meal., Disp: 60 capsule, Rfl: 0 .  lidocaine-prilocaine (EMLA) cream, Apply 1 application topically as needed., Disp: 30 g, Rfl: 10 .  loperamide (IMODIUM A-D) 2 MG tablet, Take 2 at onset of diarrhea, then 1 every 2hrs until 12hr without a BM. May take 2 tab every 4hrs at bedtime. If diarrhea recurs repeat., Disp: 100 tablet, Rfl: 1 .  LORazepam (ATIVAN) 0.5 MG tablet, Take 1 tablet (0.5 mg total) by mouth every 6 (six) hours as needed for anxiety., Disp: 30 tablet, Rfl: 0 .  magnesium oxide (MAG-OX) 400 (241.3 MG) MG tablet, Take 1 tablet (400 mg total) by mouth 2 (two) times daily., Disp: 60 tablet, Rfl: 6 .  Multiple Vitamins-Minerals (MULTIVITAMIN GUMMIES ADULT PO), Take by mouth every morning., Disp: , Rfl:  .  omeprazole (PRILOSEC) 40 MG capsule, Take 1 capsule (40 mg total) by mouth daily., Disp: 30 capsule, Rfl: 6 .  ondansetron (ZOFRAN) 8 MG tablet, Take 1 tablet (8 mg total) by mouth 2 (two) times daily. Start the day after chemo for 3 days. Then take as needed for nausea or vomiting., Disp: 30 tablet, Rfl: 1 .  potassium chloride SA (K-DUR,KLOR-CON) 20 MEQ tablet, Take 1 tablet (  20 mEq total) by mouth daily., Disp: 30 tablet, Rfl: 6 .  Probiotic Product (ALIGN) 4 MG CAPS, Take by mouth every morning., Disp: , Rfl:  .  prochlorperazine (COMPAZINE) 10 MG tablet, Take 10 mg by mouth as needed for nausea or vomiting. , Disp: , Rfl:  .  propranolol ER (INDERAL LA) 60 MG 24 hr capsule, Take 1 capsule (60 mg total) by mouth at bedtime., Disp: 30 capsule, Rfl: 6 .  pyridOXINE (VITAMIN B-6) 100 MG tablet, Take 2 pills at one time once a day., Disp: 60 tablet, Rfl: 6 .  traZODone (DESYREL) 50 MG tablet, Take 1 tablet (50 mg total) by mouth at bedtime., Disp: 30 tablet, Rfl: 3 .  venlafaxine (EFFEXOR) 37.5 MG tablet, TAKE 1 TABLET BY  MOUTH TWICE DAILY, Disp: 60 tablet, Rfl: 0  Allergies:  Allergies  Allergen Reactions  . Penicillins Other (See Comments)    Hallucinations, from childhood    Past Medical History, Surgical history, Social history, and Family History were reviewed and updated.  Review of Systems: As above  Physical Exam:  vitals were not taken for this visit.  Wt Readings from Last 3 Encounters:  06/16/15 203 lb (92.08 kg)  05/21/15 205 lb (92.987 kg)  04/23/15 202 lb (91.627 kg)     Well-developed and well-nourished white gym in no obvious distress. Head and neck exam shows no ocular or oral lesions. There are no palpable cervical or supraclavicular lymph nodes. Lungs are clear. Cardiac exam regular rate and rhythm with no murmurs, rubs or bruits. Abdomen is soft. There is no palpable abdominal mass. There is no fluid wave. There is no guarding or rebound tenderness. There is no palpable liver or spleen tip. Back exam shows no tenderness over the spine, ribs or hips. Extremities shows no clubbing, cyanosis or edema. He has good strength in his joints. He has good range of motion of his joints. Neurological exam is nonfocal. Skin exam no rashes, ecchymoses or petechia.  Lab Results  Component Value Date   WBC 4.0 06/23/2015   HGB 13.1 06/23/2015   HCT 38.0* 06/23/2015   MCV 98 06/23/2015   PLT 183 06/23/2015     Chemistry      Component Value Date/Time   NA 139 06/23/2015 0921   NA 138 05/21/2015 1000   K 4.3 06/23/2015 0921   K 4.7 05/21/2015 1000   CL 102 06/23/2015 0921   CL 104 05/21/2015 1000   CO2 25 06/23/2015 0921   CO2 27 05/21/2015 1000   BUN 20 06/23/2015 0921   BUN 24* 05/21/2015 1000   CREATININE 1.4* 06/23/2015 0921   CREATININE 1.41* 05/21/2015 1000      Component Value Date/Time   CALCIUM 8.8 06/23/2015 0921   CALCIUM 9.2 05/21/2015 1000   ALKPHOS 52 06/23/2015 0921   ALKPHOS 54 05/21/2015 1000   AST 30 06/23/2015 0921   AST 29 05/21/2015 1000   ALT 21  06/23/2015 0921   ALT 21 05/21/2015 1000   BILITOT 1.00 06/23/2015 0921   BILITOT 0.7 05/21/2015 1000         Impression and Plan: Mr. Dubey is 64 year old gentleman. He has metastatic adenocarcinoma of the distal esophagus.  We cannot treat him today. His neutrophil count is just too low. Even though the monocytes were up, I still think that we would be "tempting fate" by treating him.  We will give him IV fluids today.  I think his blood counts should be okay for his  second cycle of treatment to be given next week.  I will still plan for 4 cycles of therapy and then we can rescan him.   I will have him come back in 2 weeks for follow-up\  [name], MD 7/11/20169:23 AM

## 2015-07-07 NOTE — Patient Instructions (Signed)
Dehydration, Adult Dehydration is when you lose more fluids from the body than you take in. Vital organs like the kidneys, brain, and heart cannot function without a proper amount of fluids and salt. Any loss of fluids from the body can cause dehydration.  CAUSES   Vomiting.  Diarrhea.  Excessive sweating.  Excessive urine output.  Fever. SYMPTOMS  Mild dehydration  Thirst.  Dry lips.  Slightly dry mouth. Moderate dehydration  Very dry mouth.  Sunken eyes.  Skin does not bounce back quickly when lightly pinched and released.  Dark urine and decreased urine production.  Decreased tear production.  Headache. Severe dehydration  Very dry mouth.  Extreme thirst.  Rapid, weak pulse (more than 100 beats per minute at rest).  Cold hands and feet.  Not able to sweat in spite of heat and temperature.  Rapid breathing.  Blue lips.  Confusion and lethargy.  Difficulty being awakened.  Minimal urine production.  No tears. DIAGNOSIS  Your caregiver will diagnose dehydration based on your symptoms and your exam. Blood and urine tests will help confirm the diagnosis. The diagnostic evaluation should also identify the cause of dehydration. TREATMENT  Treatment of mild or moderate dehydration can often be done at home by increasing the amount of fluids that you drink. It is best to drink small amounts of fluid more often. Drinking too much at one time can make vomiting worse. Refer to the home care instructions below. Severe dehydration needs to be treated at the hospital where you will probably be given intravenous (IV) fluids that contain water and electrolytes. HOME CARE INSTRUCTIONS   Ask your caregiver about specific rehydration instructions.  Drink enough fluids to keep your urine clear or pale yellow.  Drink small amounts frequently if you have nausea and vomiting.  Eat as you normally do.  Avoid:  Foods or drinks high in sugar.  Carbonated  drinks.  Juice.  Extremely hot or cold fluids.  Drinks with caffeine.  Fatty, greasy foods.  Alcohol.  Tobacco.  Overeating.  Gelatin desserts.  Wash your hands well to avoid spreading bacteria and viruses.  Only take over-the-counter or prescription medicines for pain, discomfort, or fever as directed by your caregiver.  Ask your caregiver if you should continue all prescribed and over-the-counter medicines.  Keep all follow-up appointments with your caregiver. SEEK MEDICAL CARE IF:  You have abdominal pain and it increases or stays in one area (localizes).  You have a rash, stiff neck, or severe headache.  You are irritable, sleepy, or difficult to awaken.  You are weak, dizzy, or extremely thirsty. SEEK IMMEDIATE MEDICAL CARE IF:   You are unable to keep fluids down or you get worse despite treatment.  You have frequent episodes of vomiting or diarrhea.  You have blood or green matter (bile) in your vomit.  You have blood in your stool or your stool looks black and tarry.  You have not urinated in 6 to 8 hours, or you have only urinated a small amount of very dark urine.  You have a fever.  You faint. MAKE SURE YOU:   Understand these instructions.  Will watch your condition.  Will get help right away if you are not doing well or get worse. Document Released: 12/13/2005 Document Revised: 03/06/2012 Document Reviewed: 08/02/2011 ExitCare Patient Information 2015 [employer]. This information is not intended to replace advice given to you by your health care provider. Make sure you discuss any questions you have with your health care   provider.  

## 2015-07-07 NOTE — Progress Notes (Signed)
No treatment today per Dr. Marin Olp  NEUT 0.4

## 2015-07-14 ENCOUNTER — Ambulatory Visit: Payer: 59

## 2015-07-14 ENCOUNTER — Telehealth: Payer: Self-pay | Admitting: Hematology & Oncology

## 2015-07-14 ENCOUNTER — Other Ambulatory Visit: Payer: 59

## 2015-07-14 NOTE — Telephone Encounter (Signed)
Talked with wife regarding rescheduling the appt for 7/19

## 2015-07-15 ENCOUNTER — Other Ambulatory Visit (HOSPITAL_BASED_OUTPATIENT_CLINIC_OR_DEPARTMENT_OTHER): Payer: 59

## 2015-07-15 ENCOUNTER — Ambulatory Visit (HOSPITAL_BASED_OUTPATIENT_CLINIC_OR_DEPARTMENT_OTHER): Payer: 59

## 2015-07-15 VITALS — BP 134/61 | HR 66 | Temp 97.9°F | Resp 18

## 2015-07-15 DIAGNOSIS — C159 Malignant neoplasm of esophagus, unspecified: Secondary | ICD-10-CM

## 2015-07-15 DIAGNOSIS — C155 Malignant neoplasm of lower third of esophagus: Secondary | ICD-10-CM

## 2015-07-15 DIAGNOSIS — Z5111 Encounter for antineoplastic chemotherapy: Secondary | ICD-10-CM | POA: Diagnosis not present

## 2015-07-15 LAB — CBC WITH DIFFERENTIAL (CANCER CENTER ONLY)
BASO#: 0.1 10*3/uL (ref 0.0–0.2)
BASO%: 1.4 % (ref 0.0–2.0)
EOS ABS: 0.1 10*3/uL (ref 0.0–0.5)
EOS%: 3.1 % (ref 0.0–7.0)
HCT: 37.4 % — ABNORMAL LOW (ref 38.7–49.9)
HGB: 12.7 g/dL — ABNORMAL LOW (ref 13.0–17.1)
LYMPH#: 2.1 10*3/uL (ref 0.9–3.3)
LYMPH%: 50.7 % — ABNORMAL HIGH (ref 14.0–48.0)
MCH: 34.6 pg — ABNORMAL HIGH (ref 28.0–33.4)
MCHC: 34 g/dL (ref 32.0–35.9)
MCV: 102 fL — AB (ref 82–98)
MONO#: 0.8 10*3/uL (ref 0.1–0.9)
MONO%: 18.6 % — AB (ref 0.0–13.0)
NEUT%: 26.2 % — ABNORMAL LOW (ref 40.0–80.0)
NEUTROS ABS: 1.1 10*3/uL — AB (ref 1.5–6.5)
Platelets: 233 10*3/uL (ref 145–400)
RBC: 3.67 10*6/uL — ABNORMAL LOW (ref 4.20–5.70)
RDW: 16.7 % — AB (ref 11.1–15.7)
WBC: 4.2 10*3/uL (ref 4.0–10.0)

## 2015-07-15 MED ORDER — SODIUM CHLORIDE 0.9 % IV SOLN
Freq: Once | INTRAVENOUS | Status: AC
  Administered 2015-07-15: 10:00:00 via INTRAVENOUS

## 2015-07-15 MED ORDER — SODIUM CHLORIDE 0.9 % IV SOLN
Freq: Once | INTRAVENOUS | Status: AC
  Administered 2015-07-15: 11:00:00 via INTRAVENOUS
  Filled 2015-07-15: qty 8

## 2015-07-15 MED ORDER — SODIUM CHLORIDE 0.9 % IV SOLN
5000.0000 mg | INTRAVENOUS | Status: DC
  Administered 2015-07-15: 5000 mg via INTRAVENOUS
  Filled 2015-07-15: qty 100

## 2015-07-15 MED ORDER — LEUCOVORIN CALCIUM INJECTION 350 MG
400.0000 mg/m2 | Freq: Once | INTRAVENOUS | Status: AC
  Administered 2015-07-15: 900 mg via INTRAVENOUS
  Filled 2015-07-15: qty 45

## 2015-07-15 MED ORDER — ATROPINE SULFATE 1 MG/ML IJ SOLN
0.5000 mg | Freq: Once | INTRAMUSCULAR | Status: AC | PRN
  Administered 2015-07-15: 0.5 mg via INTRAVENOUS

## 2015-07-15 MED ORDER — ATROPINE SULFATE 1 MG/ML IJ SOLN
INTRAMUSCULAR | Status: AC
  Filled 2015-07-15: qty 1

## 2015-07-15 MED ORDER — FLUOROURACIL CHEMO INJECTION 2.5 GM/50ML
400.0000 mg/m2 | Freq: Once | INTRAVENOUS | Status: AC
  Administered 2015-07-15: 900 mg via INTRAVENOUS
  Filled 2015-07-15: qty 18

## 2015-07-15 MED ORDER — IRINOTECAN HCL CHEMO INJECTION 100 MG/5ML
320.0000 mg | Freq: Once | INTRAVENOUS | Status: AC
  Administered 2015-07-15: 320 mg via INTRAVENOUS
  Filled 2015-07-15: qty 5.33

## 2015-07-15 NOTE — Patient Instructions (Signed)
[facility] Discharge Instructions for Patients Receiving Chemotherapy  Today you received the following chemotherapy agents Camptosar, Leucovorin and 5FU.  To help prevent nausea and vomiting after your treatment, we encourage you to take your nausea medication.   If you develop nausea and vomiting that is not controlled by your nausea medication, call the clinic.   BELOW ARE SYMPTOMS THAT SHOULD BE REPORTED IMMEDIATELY:  *FEVER GREATER THAN 100.5 F  *CHILLS WITH OR WITHOUT FEVER  NAUSEA AND VOMITING THAT IS NOT CONTROLLED WITH YOUR NAUSEA MEDICATION  *UNUSUAL SHORTNESS OF BREATH  *UNUSUAL BRUISING OR BLEEDING  TENDERNESS IN MOUTH AND THROAT WITH OR WITHOUT PRESENCE OF ULCERS  *URINARY PROBLEMS  *BOWEL PROBLEMS  UNUSUAL RASH Items with * indicate a potential emergency and should be followed up as soon as possible.  Feel free to call the clinic you have any questions or concerns. The clinic phone number is [phone].  Please show the [facility] at check-in to the Emergency Department and triage nurse.

## 2015-07-17 ENCOUNTER — Ambulatory Visit (HOSPITAL_BASED_OUTPATIENT_CLINIC_OR_DEPARTMENT_OTHER): Payer: 59

## 2015-07-17 VITALS — BP 119/62 | HR 63 | Temp 97.9°F | Resp 18

## 2015-07-17 DIAGNOSIS — Z452 Encounter for adjustment and management of vascular access device: Secondary | ICD-10-CM

## 2015-07-17 DIAGNOSIS — C159 Malignant neoplasm of esophagus, unspecified: Secondary | ICD-10-CM

## 2015-07-17 DIAGNOSIS — C155 Malignant neoplasm of lower third of esophagus: Secondary | ICD-10-CM | POA: Diagnosis not present

## 2015-07-17 MED ORDER — SODIUM CHLORIDE 0.9 % IJ SOLN
10.0000 mL | INTRAMUSCULAR | Status: DC | PRN
  Administered 2015-07-17: 10 mL
  Filled 2015-07-17: qty 10

## 2015-07-17 MED ORDER — HEPARIN SOD (PORK) LOCK FLUSH 100 UNIT/ML IV SOLN
500.0000 [IU] | Freq: Once | INTRAVENOUS | Status: AC | PRN
  Administered 2015-07-17: 500 [IU]
  Filled 2015-07-17: qty 5

## 2015-07-17 NOTE — Patient Instructions (Signed)
Fluorouracil, 5FU; Diclofenac topical cream What is this medicine? FLUOROURACIL; DICLOFENAC (flure oh YOOR a sil; dye KLOE fen ak) is a combination of a topical chemotherapy agent and non-steroidal anti-inflammatory drug (NSAID). It is used on the skin to treat skin cancer and skin conditions that could become cancer. This medicine may be used for other purposes; ask your health care provider or pharmacist if you have questions. COMMON BRAND NAME(S): FLUORAC What should I tell my health care provider before I take this medicine? They need to know if you have any of these conditions: -bleeding problems -cigarette smoker -DPD enzyme deficiency -heart disease -high blood pressure -if you frequently drink alcohol containing drinks -kidney disease -liver disease -open or infected skin -stomach problems -swelling or open sores at the treatment site -recent or planned coronary artery bypass graft (CABG) surgery -an unusual or allergic reaction to fluorouracil, diclofenac, aspirin, other NSAIDs, other medicines, foods, dyes, or preservatives -pregnant or trying to get pregnant -breast-feeding How should I use this medicine? This medicine is only for use on the skin. Follow the directions on the prescription label. Wash hands before and after use. Wash affected area and gently pat dry. To apply this medicine use a cotton-tipped applicator, or use gloves if applying with fingertips. If applied with unprotected fingertips, it is very important to wash your hands well after you apply this medicine. Avoid applying to the eyes, nose, or mouth. Apply enough medicine to cover the affected area. You can cover the area with a light gauze dressing, but do not use tight or air-tight dressings. Finish the full course prescribed by your doctor or health care professional, even if you think your condition is better. Do not stop taking except on the advice of your doctor or health care professional. Talk to your  pediatrician regarding the use of this medicine in children. Special care may be needed. Overdosage: If you think you've taken too much of this medicine contact a poison control center or emergency room at once. Overdosage: If you think you have taken too much of this medicine contact a poison control center or emergency room at once. NOTE: This medicine is only for you. Do not share this medicine with others. What if I miss a dose? If you miss a dose, apply it as soon as you can. If it is almost time for your next dose, only use that dose. Do not apply extra doses. Contact your doctor or health care professional if you miss more than one dose. What may interact with this medicine? Interactions are not expected. Do not use any other skin products without telling your doctor or health care professional. This list may not describe all possible interactions. Give your health care provider a list of all the medicines, herbs, non-prescription drugs, or dietary supplements you use. Also tell them if you smoke, drink alcohol, or use illegal drugs. Some items may interact with your medicine. What should I watch for while using this medicine? Visit your doctor or health care professional for checks on your progress. You will need to use this medicine for 2 to 6 weeks. This may be longer depending on the condition being treated. You may not see full healing for another 1 to 2 months after you stop using the medicine. Treated areas of skin can look unsightly during and for several weeks after treatment with this medicine. This medicine can make you more sensitive to the sun. Keep out of the sun. If you cannot avoid being in   the sun, wear protective clothing and use sunscreen. Do not use sun lamps or tanning beds/booths. Where should I keep my What side effects may I notice from receiving this medicine? Side effects that you should report to your doctor or health care professional as soon as possible: -allergic  reactions like skin rash, itching or hives, swelling of the face, lips, or tongue -black or bloody stools, blood in the urine or vomit -blurred vision -chest pain -difficulty breathing or wheezing -redness, blistering, peeling or loosening of the skin, including inside the mouth -severe redness and swelling of normal skin -slurred speech or weakness on one side of the body -trouble passing urine or change in the amount of urine -unexplained weight gain or swelling -unusually weak or tired -yellowing of eyes or skin Side effects that usually do not require medical attention (Report these to your doctor or health care professional if they continue or are bothersome.): -increased sensitivity of the skin to sun and ultraviolet light -pain and burning of the affected area -scaling or swelling of the affected area -skin rash, itching of the affected area -tenderness This list may not describe all possible side effects. Call your doctor for medical advice about side effects. You may report side effects to FDA at 1-800-FDA-1088. Where should I keep my medicine? Keep out of the reach of children. Store at room temperature between 20 and 25 degrees C (68 and 77 degrees F). Throw away any unused medicine after the expiration date. NOTE: This sheet is a summary. It may not cover all possible information. If you have questions about this medicine, talk to your doctor, pharmacist, or health care provider.  2015, [employer]. (2014-04-15 11:09:58)  

## 2015-07-21 ENCOUNTER — Other Ambulatory Visit (HOSPITAL_BASED_OUTPATIENT_CLINIC_OR_DEPARTMENT_OTHER): Payer: 59

## 2015-07-21 DIAGNOSIS — C159 Malignant neoplasm of esophagus, unspecified: Secondary | ICD-10-CM

## 2015-07-21 LAB — CMP (CANCER CENTER ONLY)
ALK PHOS: 50 U/L (ref 26–84)
ALT: 12 U/L (ref 10–47)
AST: 21 U/L (ref 11–38)
Albumin: 3.4 g/dL (ref 3.3–5.5)
BUN, Bld: 20 mg/dL (ref 7–22)
CALCIUM: 9.6 mg/dL (ref 8.0–10.3)
CO2: 29 meq/L (ref 18–33)
Chloride: 104 mEq/L (ref 98–108)
Creat: 1.4 mg/dl — ABNORMAL HIGH (ref 0.6–1.2)
Glucose, Bld: 103 mg/dL (ref 73–118)
Potassium: 4.1 mEq/L (ref 3.3–4.7)
SODIUM: 138 meq/L (ref 128–145)
TOTAL PROTEIN: 6.8 g/dL (ref 6.4–8.1)
Total Bilirubin: 1.3 mg/dl (ref 0.20–1.60)

## 2015-07-21 LAB — CBC WITH DIFFERENTIAL (CANCER CENTER ONLY)
BASO#: 0 10*3/uL (ref 0.0–0.2)
BASO%: 0.8 % (ref 0.0–2.0)
EOS ABS: 0 10*3/uL (ref 0.0–0.5)
EOS%: 0.8 % (ref 0.0–7.0)
HCT: 36.9 % — ABNORMAL LOW (ref 38.7–49.9)
HEMOGLOBIN: 12.6 g/dL — AB (ref 13.0–17.1)
LYMPH#: 1.6 10*3/uL (ref 0.9–3.3)
LYMPH%: 41.1 % (ref 14.0–48.0)
MCH: 34.7 pg — ABNORMAL HIGH (ref 28.0–33.4)
MCHC: 34.1 g/dL (ref 32.0–35.9)
MCV: 102 fL — ABNORMAL HIGH (ref 82–98)
MONO#: 0.1 10*3/uL (ref 0.1–0.9)
MONO%: 2.3 % (ref 0.0–13.0)
NEUT#: 2.2 10*3/uL (ref 1.5–6.5)
NEUT%: 55 % (ref 40.0–80.0)
Platelets: 178 10*3/uL (ref 145–400)
RBC: 3.63 10*6/uL — ABNORMAL LOW (ref 4.20–5.70)
RDW: 15.2 % (ref 11.1–15.7)
WBC: 4 10*3/uL (ref 4.0–10.0)

## 2015-07-22 ENCOUNTER — Other Ambulatory Visit: Payer: Self-pay | Admitting: Hematology & Oncology

## 2015-07-22 NOTE — Telephone Encounter (Signed)
na

## 2015-07-25 ENCOUNTER — Other Ambulatory Visit: Payer: Self-pay

## 2015-07-25 DIAGNOSIS — C159 Malignant neoplasm of esophagus, unspecified: Secondary | ICD-10-CM

## 2015-07-28 ENCOUNTER — Other Ambulatory Visit (HOSPITAL_BASED_OUTPATIENT_CLINIC_OR_DEPARTMENT_OTHER): Payer: 59

## 2015-07-28 ENCOUNTER — Ambulatory Visit (HOSPITAL_BASED_OUTPATIENT_CLINIC_OR_DEPARTMENT_OTHER): Payer: 59

## 2015-07-28 ENCOUNTER — Ambulatory Visit (HOSPITAL_BASED_OUTPATIENT_CLINIC_OR_DEPARTMENT_OTHER): Payer: 59 | Admitting: Hematology & Oncology

## 2015-07-28 VITALS — BP 117/79 | HR 60 | Temp 98.2°F | Resp 20 | Ht 73.0 in | Wt 195.8 lb

## 2015-07-28 DIAGNOSIS — C155 Malignant neoplasm of lower third of esophagus: Secondary | ICD-10-CM | POA: Diagnosis not present

## 2015-07-28 DIAGNOSIS — Z5111 Encounter for antineoplastic chemotherapy: Secondary | ICD-10-CM

## 2015-07-28 DIAGNOSIS — D709 Neutropenia, unspecified: Secondary | ICD-10-CM | POA: Diagnosis not present

## 2015-07-28 DIAGNOSIS — K8681 Exocrine pancreatic insufficiency: Secondary | ICD-10-CM

## 2015-07-28 DIAGNOSIS — C159 Malignant neoplasm of esophagus, unspecified: Secondary | ICD-10-CM

## 2015-07-28 LAB — CMP (CANCER CENTER ONLY)
ALBUMIN: 3.2 g/dL — AB (ref 3.3–5.5)
ALT: 13 U/L (ref 10–47)
AST: 23 U/L (ref 11–38)
Alkaline Phosphatase: 50 U/L (ref 26–84)
BUN: 17 mg/dL (ref 7–22)
CO2: 23 mEq/L (ref 18–33)
Calcium: 9.3 mg/dL (ref 8.0–10.3)
Chloride: 108 mEq/L (ref 98–108)
Creat: 1.3 mg/dl — ABNORMAL HIGH (ref 0.6–1.2)
Glucose, Bld: 121 mg/dL — ABNORMAL HIGH (ref 73–118)
Potassium: 3.4 mEq/L (ref 3.3–4.7)
Sodium: 139 mEq/L (ref 128–145)
TOTAL PROTEIN: 6.4 g/dL (ref 6.4–8.1)
Total Bilirubin: 0.8 mg/dl (ref 0.20–1.60)

## 2015-07-28 LAB — LACTATE DEHYDROGENASE: LDH: 133 U/L (ref 94–250)

## 2015-07-28 LAB — CBC WITH DIFFERENTIAL (CANCER CENTER ONLY)
BASO#: 0 10*3/uL (ref 0.0–0.2)
BASO%: 0.4 % (ref 0.0–2.0)
EOS%: 4 % (ref 0.0–7.0)
Eosinophils Absolute: 0.1 10*3/uL (ref 0.0–0.5)
HCT: 35.1 % — ABNORMAL LOW (ref 38.7–49.9)
HGB: 11.9 g/dL — ABNORMAL LOW (ref 13.0–17.1)
LYMPH#: 1.5 10*3/uL (ref 0.9–3.3)
LYMPH%: 62.3 % — AB (ref 14.0–48.0)
MCH: 34.6 pg — ABNORMAL HIGH (ref 28.0–33.4)
MCHC: 33.9 g/dL (ref 32.0–35.9)
MCV: 102 fL — ABNORMAL HIGH (ref 82–98)
MONO#: 0.2 10*3/uL (ref 0.1–0.9)
MONO%: 7.3 % (ref 0.0–13.0)
NEUT%: 26 % — ABNORMAL LOW (ref 40.0–80.0)
NEUTROS ABS: 0.6 10*3/uL — AB (ref 1.5–6.5)
Platelets: 169 10*3/uL (ref 145–400)
RBC: 3.44 10*6/uL — ABNORMAL LOW (ref 4.20–5.70)
RDW: 16.1 % — AB (ref 11.1–15.7)
WBC: 2.5 10*3/uL — ABNORMAL LOW (ref 4.0–10.0)

## 2015-07-28 MED ORDER — SODIUM CHLORIDE 0.9 % IV SOLN
1980.0000 mg/m2 | INTRAVENOUS | Status: DC
  Administered 2015-07-28: 4450 mg via INTRAVENOUS
  Filled 2015-07-28: qty 89

## 2015-07-28 MED ORDER — SODIUM CHLORIDE 0.9 % IV SOLN
Freq: Once | INTRAVENOUS | Status: AC
  Administered 2015-07-28: 09:00:00 via INTRAVENOUS
  Filled 2015-07-28: qty 8

## 2015-07-28 MED ORDER — IRINOTECAN HCL CHEMO INJECTION 100 MG/5ML
133.0000 mg/m2 | Freq: Once | INTRAVENOUS | Status: AC
  Administered 2015-07-28: 300 mg via INTRAVENOUS
  Filled 2015-07-28: qty 5

## 2015-07-28 MED ORDER — SODIUM CHLORIDE 0.9 % IV SOLN
Freq: Once | INTRAVENOUS | Status: AC
  Administered 2015-07-28: 09:00:00 via INTRAVENOUS

## 2015-07-28 MED ORDER — LEUCOVORIN CALCIUM INJECTION 350 MG
400.0000 mg/m2 | Freq: Once | INTRAMUSCULAR | Status: AC
  Administered 2015-07-28: 900 mg via INTRAVENOUS
  Filled 2015-07-28: qty 45

## 2015-07-28 MED ORDER — ATROPINE SULFATE 1 MG/ML IJ SOLN
INTRAMUSCULAR | Status: AC
  Filled 2015-07-28: qty 1

## 2015-07-28 MED ORDER — FLUOROURACIL CHEMO INJECTION 2.5 GM/50ML
400.0000 mg/m2 | Freq: Once | INTRAVENOUS | Status: AC
  Administered 2015-07-28: 900 mg via INTRAVENOUS
  Filled 2015-07-28: qty 18

## 2015-07-28 MED ORDER — ATROPINE SULFATE 1 MG/ML IJ SOLN
0.5000 mg | Freq: Once | INTRAMUSCULAR | Status: AC | PRN
  Administered 2015-07-28: 0.5 mg via INTRAVENOUS

## 2015-07-28 NOTE — Patient Instructions (Signed)
[facility] Discharge Instructions for Patients Receiving Chemotherapy  Today you received the following chemotherapy agents Camptosar, Leucovorin and 5FU.   To help prevent nausea and vomiting after your treatment, we encourage you to take your nausea medication.   If you develop nausea and vomiting that is not controlled by your nausea medication, call the clinic.   BELOW ARE SYMPTOMS THAT SHOULD BE REPORTED IMMEDIATELY:  *FEVER GREATER THAN 100.5 F  *CHILLS WITH OR WITHOUT FEVER  NAUSEA AND VOMITING THAT IS NOT CONTROLLED WITH YOUR NAUSEA MEDICATION  *UNUSUAL SHORTNESS OF BREATH  *UNUSUAL BRUISING OR BLEEDING  TENDERNESS IN MOUTH AND THROAT WITH OR WITHOUT PRESENCE OF ULCERS  *URINARY PROBLEMS  *BOWEL PROBLEMS  UNUSUAL RASH Items with * indicate a potential emergency and should be followed up as soon as possible.  Feel free to call the clinic you have any questions or concerns. The clinic phone number is [phone].  Please show the [place] at check-in to the Emergency Department and triage nurse.

## 2015-07-28 NOTE — Progress Notes (Signed)
Per Dr Marin Olp, ok to treat with ANC 0.6. Dose reduced per Dr Marin Olp.

## 2015-07-28 NOTE — Progress Notes (Signed)
Hematology and Oncology Follow Up Visit  KEYMANI MCLEAN [id number] Mar 02, 1951 64 y.o. 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 Velazco is back for follow-up. He is looking pretty good. He is swallowing okay.  He is not having any problems with pain. He's having no problems with nausea or vomiting. He's still having some diarrhea. His wife is trying her best to try to help with that.  He is not on any Creon. We do certainly try this to see if it might help. I gave him samples last time. I told him to take it twice a day.  He's had no issues with bleeding. He's had no infections. He's had no fever. He's had no leg swelling.  There has not been any problems with mouth sores.  Overall, his performance status is ECOG 1.   Medications:  Current outpatient prescriptions:  .  B Complex-C (SUPER B COMPLEX PO), Take 1 tablet by mouth daily., Disp: , Rfl:  .  benzonatate (TESSALON PERLES) 100 MG capsule, Take 1 capsule (100 mg total) by mouth 3 (three) times daily as needed for cough., Disp: 90 capsule, Rfl: 0 .  bismuth subsalicylate (PEPTO BISMOL) 262 MG/15ML suspension, Take 30 mLs by mouth every 6 (six) hours as needed for diarrhea or loose stools., Disp: , Rfl:  .  cetirizine (ZYRTEC) 10 MG tablet, Take 10 mg by mouth as needed for allergies., Disp: , Rfl:  .  Cholecalciferol (VITAMIN D) 2000 UNITS tablet, Take 2,000 Units by mouth daily., Disp: , Rfl:  .  dexamethasone (DECADRON) 4 MG tablet, Take 2 tablets (8 mg total) by mouth 2 (two) times daily with a meal. Start the day after chemotherapy for 3 days. Take with food., Disp: 30 tablet, Rfl: 1 .  diphenhydrAMINE (BENADRYL) 25 MG tablet, Take 25 mg by mouth every 6 (six) hours as needed for allergies., Disp: , Rfl:  .  diphenoxylate-atropine (LOMOTIL) 2.5-0.025 MG per tablet, TAKE 1 TABLET BY  MOUTH FOUR TIMES DAILY AS NEEDED FOR DIARRHEA, Disp: 60 tablet, Rfl: 2 .  dronabinol (MARINOL) 5 MG capsule, Take 1 capsule (5 mg total) by mouth 2 (two) times daily before a meal., Disp: 60 capsule, Rfl: 0 .  lidocaine-prilocaine (EMLA) cream, Apply 1 application topically as needed., Disp: 30 g, Rfl: 10 .  loperamide (IMODIUM A-D) 2 MG tablet, Take 2 at onset of diarrhea, then 1 every 2hrs until 12hr without a BM. May take 2 tab every 4hrs at bedtime. If diarrhea recurs repeat., Disp: 100 tablet, Rfl: 1 .  LORazepam (ATIVAN) 0.5 MG tablet, Take 1 tablet (0.5 mg total) by mouth every 6 (six) hours as needed for anxiety., Disp: 30 tablet, Rfl: 0 .  magnesium oxide (MAG-OX) 400 (241.3 MG) MG tablet, Take 1 tablet (400 mg total) by mouth 2 (two) times daily., Disp: 60 tablet, Rfl: 6 .  Multiple Vitamins-Minerals (MULTIVITAMIN GUMMIES ADULT PO), Take by mouth every morning., Disp: , Rfl:  .  omeprazole (PRILOSEC) 40 MG capsule, Take 1 capsule (40 mg total) by mouth daily., Disp: 30 capsule, Rfl: 6 .  ondansetron (ZOFRAN) 8 MG tablet, Take 1 tablet (8 mg total) by mouth 2 (two) times daily. Start the day after chemo for 3 days. Then take as needed for nausea or vomiting., Disp: 30 tablet, Rfl: 1 .  potassium chloride SA (  K-DUR,KLOR-CON) 20 MEQ tablet, Take 1 tablet (20 mEq total) by mouth daily., Disp: 30 tablet, Rfl: 6 .  Probiotic Product (ALIGN) 4 MG CAPS, Take by mouth every morning., Disp: , Rfl:  .  prochlorperazine (COMPAZINE) 10 MG tablet, Take 10 mg by mouth as needed for nausea or vomiting. , Disp: , Rfl:  .  propranolol ER (INDERAL LA) 60 MG 24 hr capsule, Take 1 capsule (60 mg total) by mouth at bedtime., Disp: 30 capsule, Rfl: 6 .  pyridOXINE (VITAMIN B-6) 100 MG tablet, Take 2 pills at one time once a day., Disp: 60 tablet, Rfl: 6 .  traZODone (DESYREL) 50 MG tablet, Take 1 tablet (50 mg total) by mouth at bedtime., Disp: 30 tablet, Rfl: 3 .  venlafaxine (EFFEXOR) 37.5 MG tablet, TAKE 1  TABLET BY MOUTH TWICE DAILY, Disp: 60 tablet, Rfl: 0 No current facility-administered medications for this visit.  Facility-Administered Medications Ordered in Other Visits:  .  fluorouracil (ADRUCIL) 4,450 mg in sodium chloride 0.9 % 61 mL chemo infusion, 1,980 mg/m2 (Treatment Plan Actual), Intravenous, 1 day or 1 dose, [name], MD, 4,450 mg at 07/28/15 1216  Allergies:  Allergies  Allergen Reactions  . Penicillins Other (See Comments)    Hallucinations, from childhood    Past Medical History, Surgical history, Social history, and Family History were reviewed and updated.  Review of Systems: As above  Physical Exam:  height is 6' 1"  (1.854 m) and weight is 195 lb 12.8 oz (88.814 kg). His oral temperature is 98.2 F (36.8 C). His blood pressure is 117/79 and his pulse is 60. His respiration is 20.   Wt Readings from Last 3 Encounters:  07/28/15 195 lb 12.8 oz (88.814 kg)  07/07/15 203 lb (92.08 kg)  06/16/15 203 lb (92.08 kg)     Well-developed and well-nourished white gym in no obvious distress. Head and neck exam shows no ocular or oral lesions. There are no palpable cervical or supraclavicular lymph nodes. Lungs are clear. Cardiac exam regular rate and rhythm with no murmurs, rubs or bruits. Abdomen is soft. There is no palpable abdominal mass. There is no fluid wave. There is no guarding or rebound tenderness. There is no palpable liver or spleen tip. Back exam shows no tenderness over the spine, ribs or hips. Extremities shows no clubbing, cyanosis or edema. He has good strength in his joints. He has good range of motion of his joints. Neurological exam is nonfocal. Skin exam no rashes, ecchymoses or petechia.  Lab Results  Component Value Date   WBC 2.5* 07/28/2015   HGB 11.9* 07/28/2015   HCT 35.1* 07/28/2015   MCV 102* 07/28/2015   PLT 169 07/28/2015     Chemistry      Component Value Date/Time   NA 139 07/28/2015 0806   NA 138 05/21/2015 1000   K 3.4  07/28/2015 0806   K 4.7 05/21/2015 1000   CL 108 07/28/2015 0806   CL 104 05/21/2015 1000   CO2 23 07/28/2015 0806   CO2 27 05/21/2015 1000   BUN 17 07/28/2015 0806   BUN 24* 05/21/2015 1000   CREATININE 1.3* 07/28/2015 0806   CREATININE 1.41* 05/21/2015 1000      Component Value Date/Time   CALCIUM 9.3 07/28/2015 0806   CALCIUM 9.2 05/21/2015 1000   ALKPHOS 50 07/28/2015 0806   ALKPHOS 54 05/21/2015 1000   AST 23 07/28/2015 0806   AST 29 05/21/2015 1000   ALT 13 07/28/2015 0806  ALT 21 05/21/2015 1000   BILITOT 0.80 07/28/2015 0806   BILITOT 0.7 05/21/2015 1000         Impression and Plan: Mr. Bulkley is 64 year old gentleman. He has metastatic adenocarcinoma of the distal esophagus.  We will go ahead and treat him today. He does neutrophil count is low, I think this is on the way back up.  After this cycle, we will go ahead and get him set up with a PET scan to see how he is responded. Hopefully, we will see that he is responding well.  I'm just glad that he sees be doing better.  I will use Neulasta on him when we see him back for his pump discharge.  I will have him come back in 2 weeks for follow-up\  [name], MD 8/1/20164:50 PM

## 2015-07-30 ENCOUNTER — Ambulatory Visit (HOSPITAL_BASED_OUTPATIENT_CLINIC_OR_DEPARTMENT_OTHER): Payer: 59

## 2015-07-30 VITALS — BP 122/57 | HR 57 | Resp 18

## 2015-07-30 DIAGNOSIS — D709 Neutropenia, unspecified: Secondary | ICD-10-CM

## 2015-07-30 DIAGNOSIS — C155 Malignant neoplasm of lower third of esophagus: Secondary | ICD-10-CM | POA: Diagnosis not present

## 2015-07-30 DIAGNOSIS — C159 Malignant neoplasm of esophagus, unspecified: Secondary | ICD-10-CM

## 2015-07-30 DIAGNOSIS — Z452 Encounter for adjustment and management of vascular access device: Secondary | ICD-10-CM

## 2015-07-30 DIAGNOSIS — Z5189 Encounter for other specified aftercare: Secondary | ICD-10-CM | POA: Diagnosis not present

## 2015-07-30 MED ORDER — PANCRELIPASE (LIP-PROT-AMYL) 36000-114000 UNITS PO CPEP: 36000.0000 [IU] | ORAL_CAPSULE | Freq: Three times a day (TID) | ORAL | Status: DC

## 2015-07-30 MED ORDER — PEGFILGRASTIM INJECTION 6 MG/0.6ML
6.0000 mg | Freq: Once | SUBCUTANEOUS | Status: AC
  Administered 2015-07-30: 6 mg via SUBCUTANEOUS
  Filled 2015-07-30: qty 0.6

## 2015-07-30 MED ORDER — SODIUM CHLORIDE 0.9 % IJ SOLN
10.0000 mL | INTRAMUSCULAR | Status: DC | PRN
  Administered 2015-07-30: 10 mL
  Filled 2015-07-30: qty 10

## 2015-07-30 MED ORDER — HEPARIN SOD (PORK) LOCK FLUSH 100 UNIT/ML IV SOLN
500.0000 [IU] | Freq: Once | INTRAVENOUS | Status: AC | PRN
  Administered 2015-07-30: 500 [IU]
  Filled 2015-07-30: qty 5

## 2015-07-30 NOTE — Addendum Note (Signed)
Addended by: Burney Gauze R on: 07/30/2015 10:45 AM   Modules accepted: Orders

## 2015-07-30 NOTE — Patient Instructions (Signed)
Implanted Port Home Guide An implanted port is a type of central line that is placed under the skin. Central lines are used to provide IV access when treatment or nutrition needs to be given through a person's veins. Implanted ports are used for long-term IV access. An implanted port may be placed because:   You need IV medicine that would be irritating to the small veins in your hands or arms.   You need long-term IV medicines, such as antibiotics.   You need IV nutrition for a long period.   You need frequent blood draws for lab tests.   You need dialysis.  Implanted ports are usually placed in the chest area, but they can also be placed in the upper arm, the abdomen, or the leg. An implanted port has two main parts:   Reservoir. The reservoir is round and will appear as a small, raised area under your skin. The reservoir is the part where a needle is inserted to give medicines or draw blood.   Catheter. The catheter is a thin, flexible tube that extends from the reservoir. The catheter is placed into a large vein. Medicine that is inserted into the reservoir goes into the catheter and then into the vein.  HOW WILL I CARE FOR MY INCISION SITE? Do not get the incision site wet. Bathe or shower as directed by your health care provider.  HOW IS MY PORT ACCESSED? Special steps must be taken to access the port:   Before the port is accessed, a numbing cream can be placed on the skin. This helps numb the skin over the port site.   Your health care provider uses a sterile technique to access the port.  Your health care provider must put on a mask and sterile gloves.  The skin over your port is cleaned carefully with an antiseptic and allowed to dry.  The port is gently pinched between sterile gloves, and a needle is inserted into the port.  Only "non-coring" port needles should be used to access the port. Once the port is accessed, a blood return should be checked. This helps  ensure that the port is in the vein and is not clogged.   If your port needs to remain accessed for a constant infusion, a clear (transparent) bandage will be placed over the needle site. The bandage and needle will need to be changed every week, or as directed by your health care provider.   Keep the bandage covering the needle clean and dry. Do not get it wet. Follow your health care provider's instructions on how to take a shower or bath while the port is accessed.   If your port does not need to stay accessed, no bandage is needed over the port.  WHAT IS FLUSHING? Flushing helps keep the port from getting clogged. Follow your health care provider's instructions on how and when to flush the port. Ports are usually flushed with saline solution or a medicine called heparin. The need for flushing will depend on how the port is used.   If the port is used for intermittent medicines or blood draws, the port will need to be flushed:   After medicines have been given.   After blood has been drawn.   As part of routine maintenance.   If a constant infusion is running, the port may not need to be flushed.  HOW LONG WILL MY PORT STAY IMPLANTED? The port can stay in for as long as your health care   provider thinks it is needed. When it is time for the port to come out, surgery will be done to remove it. The procedure is similar to the one performed when the port was put in.  WHEN SHOULD I SEEK IMMEDIATE MEDICAL CARE? When you have an implanted port, you should seek immediate medical care if:   You notice a bad smell coming from the incision site.   You have swelling, redness, or drainage at the incision site.   You have more swelling or pain at the port site or the surrounding area.   You have a fever that is not controlled with medicine. Document Released: 12/13/2005 Document Revised: 10/03/2013 Document Reviewed: 08/20/2013 [facility] Patient Information 2015 [place], [place]. This  information is not intended to replace advice given to you by your health care provider. Make sure you discuss any questions you have with your health care provider.   Pegfilgrastim injection What is this medicine? PEGFILGRASTIM (peg fil GRA stim) is a long-acting granulocyte colony-stimulating factor that stimulates the growth of neutrophils, a type of white blood cell important in the body's fight against infection. It is used to reduce the incidence of fever and infection in patients with certain types of cancer who are receiving chemotherapy that affects the bone marrow. This medicine may be used for other purposes; ask your health care provider or pharmacist if you have questions. COMMON BRAND NAME(S): Neulasta What should I tell my health care provider before I take this medicine? They need to know if you have any of these conditions: -latex allergy -ongoing radiation therapy -sickle cell disease -skin reactions to acrylic adhesives (On-Body Injector only) -an unusual or allergic reaction to pegfilgrastim, filgrastim, other medicines, foods, dyes, or preservatives -pregnant or trying to get pregnant -breast-feeding How should I use this medicine? This medicine is for injection under the skin. If you get this medicine at home, you will be taught how to prepare and give the pre-filled syringe or how to use the On-body Injector. Refer to the patient Instructions for Use for detailed instructions. Use exactly as directed. Take your medicine at regular intervals. Do not take your medicine more often than directed. It is important that you put your used needles and syringes in a special sharps container. Do not put them in a trash can. If you do not have a sharps container, call your pharmacist or healthcare provider to get one. Talk to your pediatrician regarding the use of this medicine in children. Special care may be needed. Overdosage: If you think you have taken too much of this medicine  contact a poison control center or emergency room at once. NOTE: This medicine is only for you. Do not share this medicine with others. What if I miss a dose? It is important not to miss your dose. Call your doctor or health care professional if you miss your dose. If you miss a dose due to an On-body Injector failure or leakage, a new dose should be administered as soon as possible using a single prefilled syringe for manual use. What may interact with this medicine? Interactions have not been studied. Give your health care provider a list of all the medicines, herbs, non-prescription drugs, or dietary supplements you use. Also tell them if you smoke, drink alcohol, or use illegal drugs. Some items may interact with your medicine. This list may not describe all possible interactions. Give your health care provider a list of all the medicines, herbs, non-prescription drugs, or dietary supplements you use.  Also tell them if you smoke, drink alcohol, or use illegal drugs. Some items may interact with your medicine. What should I watch for while using this medicine? You may need blood work done while you are taking this medicine. If you are going to need a MRI, CT scan, or other procedure, tell your doctor that you are using this medicine (On-Body Injector only). What side effects may I notice from receiving this medicine? Side effects that you should report to your doctor or health care professional as soon as possible: -allergic reactions like skin rash, itching or hives, swelling of the face, lips, or tongue -dizziness -fever -pain, redness, or irritation at site where injected -pinpoint red spots on the skin -shortness of breath or breathing problems -stomach or side pain, or pain at the shoulder -swelling -tiredness -trouble passing urine Side effects that usually do not require medical attention (report to your doctor or health care professional if they continue or are bothersome): -bone  pain -muscle pain This list may not describe all possible side effects. Call your doctor for medical advice about side effects. You may report side effects to FDA at 1-800-FDA-1088. Where should I keep my medicine? Keep out of the reach of children. Store pre-filled syringes in a refrigerator between 2 and 8 degrees C (36 and 46 degrees F). Do not freeze. Keep in carton to protect from light. Throw away this medicine if it is left out of the refrigerator for more than 48 hours. Throw away any unused medicine after the expiration date. NOTE: This sheet is a summary. It may not cover all possible information. If you have questions about this medicine, talk to your doctor, pharmacist, or health care provider.  2015, [employer]. (2014-03-14 16:14:05)

## 2015-08-02 ENCOUNTER — Encounter (HOSPITAL_COMMUNITY): Payer: Self-pay

## 2015-08-02 ENCOUNTER — Inpatient Hospital Stay (HOSPITAL_COMMUNITY): Payer: 59

## 2015-08-02 ENCOUNTER — Inpatient Hospital Stay (HOSPITAL_COMMUNITY)
Admission: EM | Admit: 2015-08-02 | Discharge: 2015-08-04 | DRG: 391 | Disposition: A | Discharge status: Home / Self Care | Payer: 59 | Attending: Internal Medicine | Admitting: Internal Medicine

## 2015-08-02 DIAGNOSIS — M109 Gout, unspecified: Secondary | ICD-10-CM | POA: Diagnosis present

## 2015-08-02 DIAGNOSIS — T451X5A Adverse effect of antineoplastic and immunosuppressive drugs, initial encounter: Secondary | ICD-10-CM | POA: Diagnosis present

## 2015-08-02 DIAGNOSIS — E43 Unspecified severe protein-calorie malnutrition: Secondary | ICD-10-CM | POA: Diagnosis present

## 2015-08-02 DIAGNOSIS — Z833 Family history of diabetes mellitus: Secondary | ICD-10-CM | POA: Diagnosis not present

## 2015-08-02 DIAGNOSIS — Z880 Allergy status to penicillin: Secondary | ICD-10-CM

## 2015-08-02 DIAGNOSIS — R1110 Vomiting, unspecified: Secondary | ICD-10-CM

## 2015-08-02 DIAGNOSIS — R748 Abnormal levels of other serum enzymes: Secondary | ICD-10-CM | POA: Diagnosis present

## 2015-08-02 DIAGNOSIS — Z79899 Other long term (current) drug therapy: Secondary | ICD-10-CM

## 2015-08-02 DIAGNOSIS — E876 Hypokalemia: Secondary | ICD-10-CM | POA: Diagnosis present

## 2015-08-02 DIAGNOSIS — D72819 Decreased white blood cell count, unspecified: Secondary | ICD-10-CM | POA: Diagnosis present

## 2015-08-02 DIAGNOSIS — N179 Acute kidney failure, unspecified: Secondary | ICD-10-CM | POA: Diagnosis present

## 2015-08-02 DIAGNOSIS — R64 Cachexia: Secondary | ICD-10-CM | POA: Diagnosis present

## 2015-08-02 DIAGNOSIS — K861 Other chronic pancreatitis: Secondary | ICD-10-CM | POA: Diagnosis present

## 2015-08-02 DIAGNOSIS — E871 Hypo-osmolality and hyponatremia: Secondary | ICD-10-CM | POA: Diagnosis present

## 2015-08-02 DIAGNOSIS — D649 Anemia, unspecified: Secondary | ICD-10-CM

## 2015-08-02 DIAGNOSIS — K219 Gastro-esophageal reflux disease without esophagitis: Secondary | ICD-10-CM | POA: Diagnosis present

## 2015-08-02 DIAGNOSIS — D709 Neutropenia, unspecified: Secondary | ICD-10-CM

## 2015-08-02 DIAGNOSIS — E0590 Thyrotoxicosis, unspecified without thyrotoxic crisis or storm: Secondary | ICD-10-CM | POA: Diagnosis present

## 2015-08-02 DIAGNOSIS — Z7952 Long term (current) use of systemic steroids: Secondary | ICD-10-CM

## 2015-08-02 DIAGNOSIS — I4891 Unspecified atrial fibrillation: Secondary | ICD-10-CM | POA: Diagnosis present

## 2015-08-02 DIAGNOSIS — E875 Hyperkalemia: Secondary | ICD-10-CM | POA: Diagnosis present

## 2015-08-02 DIAGNOSIS — R197 Diarrhea, unspecified: Secondary | ICD-10-CM | POA: Diagnosis present

## 2015-08-02 DIAGNOSIS — Z87891 Personal history of nicotine dependence: Secondary | ICD-10-CM | POA: Diagnosis not present

## 2015-08-02 DIAGNOSIS — R112 Nausea with vomiting, unspecified: Secondary | ICD-10-CM | POA: Diagnosis present

## 2015-08-02 DIAGNOSIS — D6481 Anemia due to antineoplastic chemotherapy: Secondary | ICD-10-CM | POA: Diagnosis present

## 2015-08-02 DIAGNOSIS — C159 Malignant neoplasm of esophagus, unspecified: Secondary | ICD-10-CM | POA: Diagnosis present

## 2015-08-02 DIAGNOSIS — Z6821 Body mass index (BMI) 21.0-21.9, adult: Secondary | ICD-10-CM | POA: Diagnosis not present

## 2015-08-02 LAB — CBC WITH DIFFERENTIAL/PLATELET
Basophils Absolute: 0 10*3/uL (ref 0.0–0.1)
Basophils Relative: 0 % (ref 0–1)
Eosinophils Absolute: 0 10*3/uL (ref 0.0–0.7)
Eosinophils Relative: 3 % (ref 0–5)
HEMATOCRIT: 32.1 % — AB (ref 39.0–52.0)
Hemoglobin: 11.6 g/dL — ABNORMAL LOW (ref 13.0–17.0)
LYMPHS PCT: 44 % (ref 12–46)
Lymphs Abs: 0.5 10*3/uL — ABNORMAL LOW (ref 0.7–4.0)
MCH: 34.8 pg — ABNORMAL HIGH (ref 26.0–34.0)
MCHC: 36.1 g/dL — ABNORMAL HIGH (ref 30.0–36.0)
MCV: 96.4 fL (ref 78.0–100.0)
Monocytes Absolute: 0.1 10*3/uL (ref 0.1–1.0)
Monocytes Relative: 8 % (ref 3–12)
NEUTROS ABS: 0.6 10*3/uL — AB (ref 1.7–7.7)
Neutrophils Relative %: 45 % (ref 43–77)
PLATELETS: 127 10*3/uL — AB (ref 150–400)
RBC: 3.33 MIL/uL — ABNORMAL LOW (ref 4.22–5.81)
RDW: 15.7 % — AB (ref 11.5–15.5)
WBC: 1.2 10*3/uL — CL (ref 4.0–10.5)

## 2015-08-02 LAB — TSH: TSH: 1.186 u[IU]/mL (ref 0.350–4.500)

## 2015-08-02 LAB — URINALYSIS, ROUTINE W REFLEX MICROSCOPIC
Bilirubin Urine: NEGATIVE
Glucose, UA: NEGATIVE mg/dL
Ketones, ur: NEGATIVE mg/dL
Leukocytes, UA: NEGATIVE
Nitrite: NEGATIVE
PROTEIN: 30 mg/dL — AB
SPECIFIC GRAVITY, URINE: 1.026 (ref 1.005–1.030)
Urobilinogen, UA: 0.2 mg/dL (ref 0.0–1.0)
pH: 5.5 (ref 5.0–8.0)

## 2015-08-02 LAB — COMPREHENSIVE METABOLIC PANEL
ALK PHOS: 48 U/L (ref 38–126)
ALT: 12 U/L — ABNORMAL LOW (ref 17–63)
ANION GAP: 11 (ref 5–15)
AST: 16 U/L (ref 15–41)
Albumin: 3.2 g/dL — ABNORMAL LOW (ref 3.5–5.0)
BILIRUBIN TOTAL: 1.9 mg/dL — AB (ref 0.3–1.2)
BUN: 21 mg/dL — ABNORMAL HIGH (ref 6–20)
CO2: 23 mmol/L (ref 22–32)
CREATININE: 1.23 mg/dL (ref 0.61–1.24)
Calcium: 8.7 mg/dL — ABNORMAL LOW (ref 8.9–10.3)
Chloride: 94 mmol/L — ABNORMAL LOW (ref 101–111)
GFR calc Af Amer: >60 mL/min (ref >60)
GFR calc non Af Amer: >60 mL/min (ref >60)
Glucose, Bld: 156 mg/dL — ABNORMAL HIGH (ref 65–99)
POTASSIUM: 3 mmol/L — AB (ref 3.5–5.1)
Sodium: 128 mmol/L — ABNORMAL LOW (ref 135–145)
TOTAL PROTEIN: 6.5 g/dL (ref 6.5–8.1)

## 2015-08-02 LAB — CBC
HEMATOCRIT: 28.1 % — AB (ref 39.0–52.0)
Hemoglobin: 9.6 g/dL — ABNORMAL LOW (ref 13.0–17.0)
MCH: 33.2 pg (ref 26.0–34.0)
MCHC: 34.2 g/dL (ref 30.0–36.0)
MCV: 97.2 fL (ref 78.0–100.0)
PLATELETS: 101 10*3/uL — AB (ref 150–400)
RBC: 2.89 MIL/uL — AB (ref 4.22–5.81)
RDW: 15.7 % — AB (ref 11.5–15.5)
WBC: 1.3 10*3/uL — AB (ref 4.0–10.5)

## 2015-08-02 LAB — URINE MICROSCOPIC-ADD ON

## 2015-08-02 LAB — I-STAT CG4 LACTIC ACID, ED: LACTIC ACID, VENOUS: 1.19 mmol/L (ref 0.5–2.0)

## 2015-08-02 LAB — MAGNESIUM: MAGNESIUM: 1.3 mg/dL — AB (ref 1.7–2.4)

## 2015-08-02 LAB — CREATININE, SERUM
Creatinine, Ser: 1.09 mg/dL (ref 0.61–1.24)
GFR calc Af Amer: >60 mL/min (ref >60)

## 2015-08-02 LAB — LIPASE, BLOOD: Lipase: 21 U/L — ABNORMAL LOW (ref 22–51)

## 2015-08-02 MED ORDER — ONDANSETRON HCL 4 MG/2ML IJ SOLN
4.0000 mg | Freq: Once | INTRAMUSCULAR | Status: AC
  Administered 2015-08-02: 4 mg via INTRAVENOUS
  Filled 2015-08-02: qty 2

## 2015-08-02 MED ORDER — SODIUM CHLORIDE 0.9 % IJ SOLN
3.0000 mL | Freq: Two times a day (BID) | INTRAMUSCULAR | Status: DC
  Administered 2015-08-03: 3 mL via INTRAVENOUS

## 2015-08-02 MED ORDER — MAGNESIUM OXIDE 400 (241.3 MG) MG PO TABS
400.0000 mg | ORAL_TABLET | Freq: Two times a day (BID) | ORAL | Status: DC
  Administered 2015-08-02 – 2015-08-03 (×3): 400 mg via ORAL
  Filled 2015-08-02 (×4): qty 1

## 2015-08-02 MED ORDER — SODIUM CHLORIDE 0.9 % IV SOLN
INTRAVENOUS | Status: DC
  Administered 2015-08-02 – 2015-08-03 (×2): via INTRAVENOUS

## 2015-08-02 MED ORDER — TRAZODONE HCL 50 MG PO TABS: 50.0000 mg | ORAL_TABLET | Freq: Every day | ORAL | Status: DC

## 2015-08-02 MED ORDER — ONDANSETRON HCL 4 MG PO TABS: 4.0000 mg | ORAL_TABLET | Freq: Four times a day (QID) | ORAL | Status: DC | PRN

## 2015-08-02 MED ORDER — SODIUM CHLORIDE 0.9 % IJ SOLN
10.0000 mL | INTRAMUSCULAR | Status: DC | PRN
  Administered 2015-08-02: 20 mL
  Administered 2015-08-03: 10 mL
  Administered 2015-08-03: 20 mL
  Administered 2015-08-04 (×2): 10 mL
  Filled 2015-08-02 (×5): qty 40

## 2015-08-02 MED ORDER — ONDANSETRON HCL 4 MG/2ML IJ SOLN
4.0000 mg | Freq: Four times a day (QID) | INTRAMUSCULAR | Status: DC | PRN
  Administered 2015-08-04: 4 mg via INTRAVENOUS
  Filled 2015-08-02: qty 2

## 2015-08-02 MED ORDER — LORAZEPAM 0.5 MG PO TABS
0.5000 mg | ORAL_TABLET | Freq: Four times a day (QID) | ORAL | Status: DC | PRN
  Administered 2015-08-03 (×2): 0.5 mg via ORAL
  Filled 2015-08-02 (×2): qty 1

## 2015-08-02 MED ORDER — BISMUTH SUBSALICYLATE 262 MG/15ML PO SUSP
30.0000 mL | Freq: Four times a day (QID) | ORAL | Status: DC | PRN
  Filled 2015-08-02: qty 118

## 2015-08-02 MED ORDER — PANCRELIPASE (LIP-PROT-AMYL) 36000-114000 UNITS PO CPEP
36000.0000 [IU] | ORAL_CAPSULE | Freq: Three times a day (TID) | ORAL | Status: DC
  Administered 2015-08-03 (×3): 36000 [IU] via ORAL
  Filled 2015-08-02 (×7): qty 1

## 2015-08-02 MED ORDER — SODIUM CHLORIDE 0.9 % IV BOLUS (SEPSIS)
1000.0000 mL | Freq: Once | INTRAVENOUS | Status: AC
  Administered 2015-08-02: 1000 mL via INTRAVENOUS

## 2015-08-02 MED ORDER — MORPHINE SULFATE 2 MG/ML IJ SOLN
2.0000 mg | INTRAMUSCULAR | Status: DC | PRN
  Administered 2015-08-02 – 2015-08-03 (×3): 2 mg via INTRAVENOUS
  Filled 2015-08-02 (×3): qty 1

## 2015-08-02 MED ORDER — PANTOPRAZOLE SODIUM 40 MG IV SOLR
40.0000 mg | Freq: Two times a day (BID) | INTRAVENOUS | Status: DC
  Administered 2015-08-02 – 2015-08-03 (×3): 40 mg via INTRAVENOUS
  Filled 2015-08-02 (×4): qty 40

## 2015-08-02 MED ORDER — PANTOPRAZOLE SODIUM 40 MG PO TBEC: 40.0000 mg | DELAYED_RELEASE_TABLET | Freq: Every day | ORAL | Status: DC

## 2015-08-02 MED ORDER — VENLAFAXINE HCL 37.5 MG PO TABS
37.5000 mg | ORAL_TABLET | Freq: Two times a day (BID) | ORAL | Status: DC
  Administered 2015-08-02 – 2015-08-03 (×3): 37.5 mg via ORAL
  Filled 2015-08-02 (×4): qty 1

## 2015-08-02 MED ORDER — DRONABINOL 2.5 MG PO CAPS
5.0000 mg | ORAL_CAPSULE | Freq: Two times a day (BID) | ORAL | Status: DC
  Administered 2015-08-02 – 2015-08-04 (×4): 5 mg via ORAL
  Filled 2015-08-02 (×4): qty 2

## 2015-08-02 MED ORDER — ENOXAPARIN SODIUM 40 MG/0.4ML ~~LOC~~ SOLN
40.0000 mg | SUBCUTANEOUS | Status: DC
Start: 2015-08-02 — End: 2015-08-04
  Administered 2015-08-02 – 2015-08-03 (×2): 40 mg via SUBCUTANEOUS
  Filled 2015-08-02 (×3): qty 0.4

## 2015-08-02 MED ORDER — ACETAMINOPHEN 325 MG PO TABS
650.0000 mg | ORAL_TABLET | Freq: Four times a day (QID) | ORAL | Status: DC | PRN
Start: 2015-08-02 — End: 2015-08-04
  Administered 2015-08-02: 650 mg via ORAL
  Filled 2015-08-02: qty 2

## 2015-08-02 MED ORDER — POTASSIUM CHLORIDE CRYS ER 20 MEQ PO TBCR
20.0000 meq | EXTENDED_RELEASE_TABLET | Freq: Every day | ORAL | Status: DC
  Administered 2015-08-03: 20 meq via ORAL
  Filled 2015-08-02: qty 1

## 2015-08-02 MED ORDER — ACETAMINOPHEN 650 MG RE SUPP: 650.0000 mg | Freq: Four times a day (QID) | RECTAL | Status: DC | PRN

## 2015-08-02 MED ORDER — POTASSIUM CHLORIDE 10 MEQ/100ML IV SOLN
10.0000 meq | INTRAVENOUS | Status: AC
  Administered 2015-08-02 (×5): 10 meq via INTRAVENOUS
  Filled 2015-08-02 (×4): qty 100

## 2015-08-02 MED ORDER — ONDANSETRON HCL 8 MG PO TABS
8.0000 mg | ORAL_TABLET | Freq: Two times a day (BID) | ORAL | Status: DC
Start: 2015-08-02 — End: 2015-08-04
  Administered 2015-08-02 – 2015-08-03 (×4): 8 mg via ORAL
  Filled 2015-08-02: qty 1
  Filled 2015-08-02: qty 2
  Filled 2015-08-02 (×3): qty 1
  Filled 2015-08-02: qty 2
  Filled 2015-08-02: qty 1

## 2015-08-02 NOTE — ED Notes (Signed)
Pt c/o L side abdominal pain, emesis, diarrhea, and increased falls x 2 days.  Pain score 5/10.  Hx of esophageal CA and reports he received an injection x 2 days ago.  Pt had never had that type of injection before.  Pt believes he had home chemo Monday-Wednesday.

## 2015-08-02 NOTE — ED Notes (Signed)
Patient is resting comfortably. 

## 2015-08-02 NOTE — ED Notes (Signed)
He remains drowsy and in no distress.  He is aware of plan to admit to hospital.

## 2015-08-02 NOTE — H&P (Addendum)
[facility] History and Physical  LIEUTENANT ABARCA WIO:[id number] DOB: 12-11-1951 DOA: 08/02/2015  Referring physician: Dr. Laneta Simmers PCP: Chesley Noon, MD   Chief Complaint: nausea vomting and diarhea  HPI: Willie Owens is a 64 y.o. male past medical history of adenocarcinoma of the esophagus status post chemotherapy his last one on Monday, chronic pancreatitis that comes in for nausea vomiting and diarrhea since Tuesday he relates progressively getting worse no hematemesis or melena, he relates no fevers cough or shortness of breath no sick contacts. No recent antibiotic treatment. he says his hemoglobin this morning felt dizzy and fell. Has been no change in medications no new medications. Last every 24 hours his at 3 watery bowel stools. He relates he is anorexic.  In the ED: A basic metabolic panel done was check shows hyponatremia hypokalemia rising kidney function compared to his baseline of 0.9) his CBC was done that shows a white count 1.2 hemoglobin 11.6)  Review of Systems:  Constitutional:  No weight loss, night sweats, Fevers, chills, fatigue.  HEENT:  No headaches, Difficulty swallowing,Tooth/dental problems,Sore throat,  No sneezing, itching, ear ache, nasal congestion, post nasal drip,  Cardio-vascular:  No chest pain, Orthopnea, PND, swelling in lower extremities, anasarca, dizziness, palpitations  GI:  No heartburn, indigestion,  Resp:  No shortness of breath with exertion or at rest. No excess mucus, no productive cough, No non-productive cough, No coughing up of blood.No change in color of mucus.No wheezing.No chest wall deformity  Skin:  no rash or lesions.  GU:  no dysuria, change in color of urine, no urgency or frequency. No flank pain.  Musculoskeletal:  No joint pain or swelling. No decreased range of motion. No back pain.  Psych:  No change in mood or affect. No depression or anxiety. No memory loss.   Past Medical History  Diagnosis Date    . Atrial fibrillation     with RVR while hospitalized in 2014  . Hyperthyroidism     thyrotoxicosis , graves  . H/O [facility] spotted fever   . Gout   . History of antinuclear antibodies   . GERD (gastroesophageal reflux disease)   . Cancer 06/2014    esopageal ca-7/15  . Esophageal cancer 07/26/2014  . Maintenance chemotherapy     every 3 weeks/last chemo  was on 11/14/14  . Portacath in place     right side upper chest   Past Surgical History  Procedure Laterality Date  . Leg surgery      Fx, rod removed/right leg  . Transthoracic echocardiogram  4/292014    EF 55-60%; severe bi-atrial enlargement; RV mod dilated  . Cardioversion N/A 09/21/2013    Procedure: CARDIOVERSION;  Surgeon: Pixie Casino, MD;  Location: [facility] ENDOSCOPY;  Service: Cardiovascular;  Laterality: N/A;  . Tonsillectomy and adenoidectomy    . Porta cath      right side chest   Social History:  reports that he quit smoking about 10 years ago. His smoking use included Cigarettes. He started smoking about 44 years ago. He has a 68 pack-year smoking history. He has never used smokeless tobacco. He reports that he does not drink alcohol or use illicit drugs.  Allergies  Allergen Reactions  . Penicillins Other (See Comments)    Hallucinations, from childhood    Family History  Problem Relation Age of Onset  . Pulmonary embolism Neg Hx   . CAD Neg Hx   . Colon cancer Neg Hx   . Stomach cancer  Neg Hx   . Esophageal cancer Neg Hx   . Diabetes Mother     Prior to Admission medications   Medication Sig Start Date End Date Taking? Authorizing Provider  B Complex-C (SUPER B COMPLEX PO) Take 1 tablet by mouth daily.   Yes Historical Provider, MD  benzonatate (TESSALON PERLES) 100 MG capsule Take 1 capsule (100 mg total) by mouth 3 (three) times daily as needed for cough. 12/06/14  Yes Eliezer Bottom, NP  bismuth subsalicylate (PEPTO BISMOL) 262 MG/15ML suspension Take 30 mLs by mouth every 6 (six)  hours as needed for diarrhea or loose stools.   Yes Historical Provider, MD  cetirizine (ZYRTEC) 10 MG tablet Take 10 mg by mouth as needed for allergies.   Yes Historical Provider, MD  Cholecalciferol (VITAMIN D) 2000 UNITS tablet Take 2,000 Units by mouth daily.   Yes Historical Provider, MD  dexamethasone (DECADRON) 4 MG tablet Take 2 tablets (8 mg total) by mouth 2 (two) times daily with a meal. Start the day after chemotherapy for 3 days. Take with food. 06/02/15  Yes [name], MD  diphenhydrAMINE (BENADRYL) 25 MG tablet Take 25 mg by mouth every 6 (six) hours as needed for allergies.   Yes Historical Provider, MD  diphenoxylate-atropine (LOMOTIL) 2.5-0.025 MG per tablet TAKE 1 TABLET BY MOUTH FOUR TIMES DAILY AS NEEDED FOR DIARRHEA 07/22/15  Yes [name], MD  dronabinol (MARINOL) 5 MG capsule Take 1 capsule (5 mg total) by mouth 2 (two) times daily before a meal. 03/20/15  Yes [name], MD  lidocaine-prilocaine (EMLA) cream Apply 1 application topically as needed. Patient taking differently: Apply 1 application topically as needed (numbing port every 2 weeks).  07/31/14  Yes [name], MD  lipase/protease/amylase (CREON) 36000 UNITS CPEP capsule Take 1 capsule (36,000 Units total) by mouth 3 (three) times daily before meals. 07/30/15  Yes [name], MD  loperamide (IMODIUM A-D) 2 MG tablet Take 2 at onset of diarrhea, then 1 every 2hrs until 12hr without a BM. May take 2 tab every 4hrs at bedtime. If diarrhea recurs repeat. 06/02/15  Yes [name], MD  magnesium oxide (MAG-OX) 400 (241.3 MG) MG tablet Take 1 tablet (400 mg total) by mouth 2 (two) times daily. 06/16/15  Yes [name], MD  Multiple Vitamins-Minerals (MULTIVITAMIN GUMMIES ADULT PO) Take by mouth every morning.   Yes Historical Provider, MD  omeprazole (PRILOSEC) 40 MG capsule Take 1 capsule (40 mg total) by mouth daily. 04/23/15  Yes [name], MD  ondansetron (ZOFRAN) 8 MG tablet Take 1 tablet  (8 mg total) by mouth 2 (two) times daily. Start the day after chemo for 3 days. Then take as needed for nausea or vomiting. 06/02/15  Yes [name], MD  potassium chloride SA (K-DUR,KLOR-CON) 20 MEQ tablet Take 1 tablet (20 mEq total) by mouth daily. 06/16/15  Yes [name], MD  Probiotic Product (ALIGN) 4 MG CAPS Take by mouth every morning.   Yes Historical Provider, MD  prochlorperazine (COMPAZINE) 10 MG tablet Take 10 mg by mouth as needed for nausea or vomiting.    Yes Historical Provider, MD  propranolol ER (INDERAL LA) 60 MG 24 hr capsule Take 1 capsule (60 mg total) by mouth at bedtime. 01/24/15  Yes [name], MD  pyridOXINE (VITAMIN B-6) 100 MG tablet Take 2 pills at one time once a day. 03/20/15  Yes [name], MD  traZODone (  DESYREL) 50 MG tablet Take 1 tablet (50 mg total) by mouth at bedtime. 10/24/14  Yes Eliezer Bottom, NP  venlafaxine (EFFEXOR) 37.5 MG tablet TAKE 1 TABLET BY MOUTH TWICE DAILY 02/19/15  Yes [name], MD  LORazepam (ATIVAN) 0.5 MG tablet Take 1 tablet (0.5 mg total) by mouth every 6 (six) hours as needed for anxiety. 06/02/15   [name], MD   Physical Exam: Filed Vitals:   08/02/15 1214 08/02/15 1301 08/02/15 1311 08/02/15 1409  BP: 153/74 142/74 140/66 149/71  Pulse: 85 78 78 82  Temp: 97.4 F (36.3 C)     TempSrc: Oral     Resp: 13 10 18 15   SpO2: 100% 100% 98% 96%    Wt Readings from Last 3 Encounters:  07/28/15 88.814 kg (195 lb 12.8 oz)  07/07/15 92.08 kg (203 lb)  06/16/15 92.08 kg (203 lb)    General:  Appears calm and comfortable Eyes: PERRL, normal lids, irises & conjunctiva ENT: grossly normal hearing, lips & tongue Neck: no LAD, masses or thyromegaly Cardiovascular: RRR, no m/r/g. No LE edema. Telemetry: SR, no arrhythmias  Respiratory: CTA bilaterally, no w/r/r. Normal respiratory effort. Abdomen:  soft nontender nondistended. No rebound guarding Skin: no rash or induration seen on limited  exam Musculoskeletal: grossly normal tone BUE/BLE Psychiatric: grossly normal mood and affect, speech fluent and appropriate Neurologic: grossly non-focal.          Labs on Admission:  Basic Metabolic Panel:  Recent Labs Lab 07/28/15 0806 08/02/15 1248  NA 139 128*  K 3.4 3.0*  CL 108 94*  CO2 23 23  GLUCOSE 121* 156*  BUN 17 21*  CREATININE 1.3* 1.23  CALCIUM 9.3 8.7*   Liver Function Tests:  Recent Labs Lab 07/28/15 0806 08/02/15 1248  AST 23 16  ALT 13 12*  ALKPHOS 50 48  BILITOT 0.80 1.9*  PROT 6.4 6.5  ALBUMIN  --  3.2*   No results for input(s): LIPASE, AMYLASE in the last 168 hours. No results for input(s): AMMONIA in the last 168 hours. CBC:  Recent Labs Lab 07/28/15 0806 08/02/15 1248  WBC 2.5* 1.2*  NEUTROABS 0.6* 0.6*  HGB 11.9* 11.6*  HCT 35.1* 32.1*  MCV 102* 96.4  PLT 169 127*   Cardiac Enzymes: No results for input(s): CKTOTAL, CKMB, CKMBINDEX, TROPONINI in the last 168 hours.  BNP (last 3 results) No results for input(s): BNP in the last 8760 hours.  ProBNP (last 3 results) No results for input(s): PROBNP in the last 8760 hours.  CBG: No results for input(s): GLUCAP in the last 168 hours.  Radiological Exams on Admission: No results found.  EKG: Independently reviewed. none  Assessment/Plan Intractable nausea and vomiting/  Diarrhea: - Etiology of his intractable nausea vomiting and diarrhea likely mucositis from the patient chemotherapy. - He denies any hematemesis melena or bright red blood per rectum.  - Check a C. difficile PCR. His abdominal exam is benign. - Also in differential viral gastroenteritis which seems less likely. - Check fecal lactoferrin.  Acute kidney injury: - Compared compared to previous  Creatinines going back to his records 6 months ago his creatinine is 0.9. - We'll check urinary sodium urinary creatinine, his also hyponatremic so we'll get a serum osmolarity and urine osmolarity. Will start him on  aggressive IV fluid hydration. He has already gotten 2 L in the ED will continue it at 100 mL an hour for the next 24 hours. Monitor strict I's and  O's.  Protein-calorie malnutrition, severe - Ensure TID.  Hyponatremia: - Pre-renal etiology start aggressive IV fluid hydration, check urinary sodium urinary creatinine, serum osmolarity and urine osmolarity. Check a TSH.  Hypokalemia: - We will replete IV as the patient is nausea and vomiting. Check a magnesium level.  Normocytic anemia: - Daily due to chemotherapy continue to monitor. He is hemoconcentrated will likely drop after hydration.  Elevated liver enzymes: - Likely due to nausea and vomiting, his alkaline phosphatase is 50, billi is mildly elevated, AST is normal at ALT is slightly elevated. - Recheck in the morning. - Hepatic function panel in the morning.    Code Status: full DVT Prophylaxis:enoxiparin Family Communication: none Disposition Plan: inpatient  Time spent: 70 min  Charlynne Cousins [facility] Pager [phone]

## 2015-08-02 NOTE — ED Notes (Signed)
Nurse drawing labs. 

## 2015-08-02 NOTE — ED Provider Notes (Signed)
CSN: [id number]     Arrival date & time 08/02/15  1203 History   First MD Initiated Contact with Patient 08/02/15 1207     Chief Complaint  Patient presents with  . Chemo Card   . Abdominal Pain  . Emesis  . Fall     (Consider location/radiation/quality/duration/timing/severity/associated sxs/prior Treatment) Patient is a 64 y.o. male presenting with abdominal pain, vomiting, and fall. The history is provided by the patient.  Abdominal Pain Pain location:  LLQ Pain quality: aching   Pain radiates to:  Does not radiate Pain severity:  Mild Onset quality:  Gradual Duration:  3 days Timing:  Intermittent Progression:  Waxing and waning Chronicity:  New Context comment:  Diarrhea, n/v Relieved by:  Nothing Worsened by:  Nothing tried Ineffective treatments:  None tried Associated symptoms: vomiting   Risk factors comment:  Chemo patient Emesis Associated symptoms: abdominal pain   Fall Associated symptoms include abdominal pain.    Past Medical History  Diagnosis Date  . Atrial fibrillation     with RVR while hospitalized in 2014  . Hyperthyroidism     thyrotoxicosis , graves  . H/O [facility] spotted fever   . Gout   . History of antinuclear antibodies   . GERD (gastroesophageal reflux disease)   . Cancer 06/2014    esopageal ca-7/15  . Esophageal cancer 07/26/2014  . Maintenance chemotherapy     every 3 weeks/last chemo  was on 11/14/14  . Portacath in place     right side upper chest   Past Surgical History  Procedure Laterality Date  . Leg surgery      Fx, rod removed/right leg  . Transthoracic echocardiogram  4/292014    EF 55-60%; severe bi-atrial enlargement; RV mod dilated  . Cardioversion N/A 09/21/2013    Procedure: CARDIOVERSION;  Surgeon: Pixie Casino, MD;  Location: [facility] ENDOSCOPY;  Service: Cardiovascular;  Laterality: N/A;  . Tonsillectomy and adenoidectomy    . Porta cath      right side chest   Family History  Problem Relation Age of  Onset  . Pulmonary embolism Neg Hx   . CAD Neg Hx   . Colon cancer Neg Hx   . Stomach cancer Neg Hx   . Esophageal cancer Neg Hx   . Diabetes Mother    History  Substance Use Topics  . Smoking status: Former Smoker -- 2.00 packs/day for 34 years    Types: Cigarettes    Start date: 03/19/1971    Quit date: 12/30/2004  . Smokeless tobacco: Never Used     Comment: quit smoking 9 years ago  . Alcohol Use: No     Comment: vodka daily    Review of Systems  Gastrointestinal: Positive for vomiting and abdominal pain.  All other systems reviewed and are negative.     Allergies  Penicillins  Home Medications   Prior to Admission medications   Medication Sig Start Date End Date Taking? Authorizing Provider  B Complex-C (SUPER B COMPLEX PO) Take 1 tablet by mouth daily.   Yes Historical Provider, MD  benzonatate (TESSALON PERLES) 100 MG capsule Take 1 capsule (100 mg total) by mouth 3 (three) times daily as needed for cough. 12/06/14  Yes Eliezer Bottom, NP  bismuth subsalicylate (PEPTO BISMOL) 262 MG/15ML suspension Take 30 mLs by mouth every 6 (six) hours as needed for diarrhea or loose stools.   Yes Historical Provider, MD  cetirizine (ZYRTEC) 10 MG tablet Take 10 mg by  mouth daily as needed for allergies.    Yes Historical Provider, MD  Cholecalciferol (VITAMIN D) 2000 UNITS tablet Take 2,000 Units by mouth daily.   Yes Historical Provider, MD  diphenhydrAMINE (BENADRYL) 25 MG tablet Take 25 mg by mouth every 6 (six) hours as needed for allergies.   Yes Historical Provider, MD  diphenoxylate-atropine (LOMOTIL) 2.5-0.025 MG per tablet TAKE 1 TABLET BY MOUTH FOUR TIMES DAILY AS NEEDED FOR DIARRHEA 07/22/15  Yes [name], MD  dronabinol (MARINOL) 5 MG capsule Take 1 capsule (5 mg total) by mouth 2 (two) times daily before a meal. 03/20/15  Yes [name], MD  lidocaine-prilocaine (EMLA) cream Apply 1 application topically as needed. Patient taking differently: Apply 1  application topically as needed (numbing port every 2 weeks).  07/31/14  Yes [name], MD  lipase/protease/amylase (CREON) 36000 UNITS CPEP capsule Take 1 capsule (36,000 Units total) by mouth 3 (three) times daily before meals. 07/30/15  Yes [name], MD  loperamide (IMODIUM A-D) 2 MG tablet Take 2 at onset of diarrhea, then 1 every 2hrs until 12hr without a BM. May take 2 tab every 4hrs at bedtime. If diarrhea recurs repeat. 06/02/15  Yes [name], MD  magnesium oxide (MAG-OX) 400 (241.3 MG) MG tablet Take 1 tablet (400 mg total) by mouth 2 (two) times daily. 06/16/15  Yes [name], MD  Multiple Vitamin (MULTIVITAMIN WITH MINERALS) TABS tablet Take 1 tablet by mouth daily.   Yes Historical Provider, MD  omeprazole (PRILOSEC) 40 MG capsule Take 1 capsule (40 mg total) by mouth daily. 04/23/15  Yes [name], MD  ondansetron (ZOFRAN) 8 MG tablet Take 1 tablet (8 mg total) by mouth 2 (two) times daily. Start the day after chemo for 3 days. Then take as needed for nausea or vomiting. 06/02/15  Yes [name], MD  pegfilgrastim (NEULASTA) 6 MG/0.6ML injection Inject 6 mg into the skin once.   Yes Historical Provider, MD  potassium chloride SA (K-DUR,KLOR-CON) 20 MEQ tablet Take 1 tablet (20 mEq total) by mouth daily. 06/16/15  Yes [name], MD  [place]   Yes Historical Provider, MD  Probiotic Product (ALIGN) 4 MG CAPS Take 1 capsule by mouth daily with breakfast.    Yes Historical Provider, MD  prochlorperazine (COMPAZINE) 10 MG tablet Take 10 mg by mouth every 6 (six) hours as needed for nausea or vomiting.    Yes Historical Provider, MD  propranolol ER (INDERAL LA) 60 MG 24 hr capsule Take 1 capsule (60 mg total) by mouth at bedtime. 01/24/15  Yes [name], MD  pyridOXINE (VITAMIN B-6) 100 MG tablet Take 2 pills at one time once a day. 03/20/15  Yes [name], MD  traZODone (DESYREL) 50 MG tablet Take 1 tablet (50 mg total) by  mouth at bedtime. Patient taking differently: Take 50 mg by mouth at bedtime as needed for sleep.  10/24/14  Yes Eliezer Bottom, NP  venlafaxine (EFFEXOR) 37.5 MG tablet TAKE 1 TABLET BY MOUTH TWICE DAILY 02/19/15  Yes [name], MD   BP 152/79 mmHg  Pulse 72  Temp(Src) 97.7 F (36.5 C) (Oral)  Resp 12  Ht 6\' 6"  (1.981 m)  Wt 189 lb 8 oz (85.957 kg)  BMI 21.90 kg/m2  SpO2 100% Physical Exam  Constitutional: He is oriented to person, place, and time. He appears well-developed and well-nourished. He appears ill. No distress.  HENT:  Head: Normocephalic.  Small upper lip abrasion  Eyes: Conjunctivae are normal.  Neck: Neck supple. No tracheal deviation present.  Cardiovascular: Normal rate, regular rhythm and normal heart sounds.   Pulmonary/Chest: Effort normal and breath sounds normal. No respiratory distress.  Abdominal: Soft. He exhibits no distension. There is no tenderness.  Neurological: He is alert and oriented to person, place, and time.  Skin: Skin is warm and dry.  Psychiatric: He has a normal mood and affect.    ED Course  Procedures (including critical care time) Labs Review Labs Reviewed  CBC WITH DIFFERENTIAL/PLATELET - Abnormal; Notable for the following:    WBC 1.2 (*)    RBC 3.33 (*)    Hemoglobin 11.6 (*)    HCT 32.1 (*)    MCH 34.8 (*)    MCHC 36.1 (*)    RDW 15.7 (*)    Platelets 127 (*)    Neutro Abs 0.6 (*)    Lymphs Abs 0.5 (*)    All other components within normal limits  COMPREHENSIVE METABOLIC PANEL - Abnormal; Notable for the following:    Sodium 128 (*)    Potassium 3.0 (*)    Chloride 94 (*)    Glucose, Bld 156 (*)    BUN 21 (*)    Calcium 8.7 (*)    Albumin 3.2 (*)    ALT 12 (*)    Total Bilirubin 1.9 (*)    All other components within normal limits  URINALYSIS, ROUTINE W REFLEX MICROSCOPIC (NOT AT [facility])  CREATININE, URINE, RANDOM  OSMOLALITY  OSMOLALITY, URINE  SODIUM, URINE, RANDOM  CBC  CREATININE, SERUM  MAGNESIUM   URINALYSIS, ROUTINE W REFLEX MICROSCOPIC (NOT AT [facility])  FECAL LACTOFERRIN  SODIUM, URINE, RANDOM  CREATININE, URINE, RANDOM  TSH  LIPASE, BLOOD  CBC  BASIC METABOLIC PANEL  HEPATIC FUNCTION PANEL  I-STAT CG4 LACTIC ACID, ED    Imaging Review Dg Chest Port 1 View  08/02/2015   CLINICAL DATA:  Esophageal adenocarcinoma status post chemotherapy. Chronic pancreatitis with nausea, vomiting and diarrhea.  EXAM: PORTABLE CHEST - 1 VIEW  COMPARISON:  PET-CT 05/19/2015.  Chest radiographs 11/28/2014.  FINDINGS: 1546 hours. Right IJ Port-A-Cath tip is unchanged at the level of the superior right atrium. The heart size and mediastinal contours are stable. The lungs appear stable with interstitial prominence. No progressive nodularity identified. There is no confluent airspace opacity or pleural effusion. The bones appear unchanged.  IMPRESSION: Radiographically stable appearance of the chest without progressive nodularity. Port-A-Cath unchanged at the upper right atrial level.   Electronically Signed   By: Richardean Sale M.D.   On: 08/02/2015 16:05   I independently viewed and interpreted the above radiology studies and agree with radiologist report.    EKG Interpretation None      MDM   Final diagnoses:  Diarrhea  Chemotherapy adverse reaction, initial encounter  Hyponatremia   Pt presents with chemotherapy-induced adverse reaction. Having n/v/d and loss of appetite. Mild abdominal discomfort. Has had some GLFs related to severe progressive weakness. No signs of infection currently. Lactate not elevated. Fluid resuscitated in ED, will admit for further monitoring and management. Hospitalist was consulted for admission and will see the patient in the emergency department.    Leo Grosser, MD 08/02/15 [phone]

## 2015-08-03 ENCOUNTER — Encounter: Payer: Self-pay | Admitting: Hematology & Oncology

## 2015-08-03 LAB — RENAL FUNCTION PANEL
ALBUMIN: 2.5 g/dL — AB (ref 3.5–5.0)
ANION GAP: 5 (ref 5–15)
BUN: 13 mg/dL (ref 6–20)
CALCIUM: 7.8 mg/dL — AB (ref 8.9–10.3)
CHLORIDE: 104 mmol/L (ref 101–111)
CO2: 23 mmol/L (ref 22–32)
CREATININE: 0.98 mg/dL (ref 0.61–1.24)
GFR calc Af Amer: >60 mL/min (ref >60)
GFR calc non Af Amer: >60 mL/min (ref >60)
Glucose, Bld: 113 mg/dL — ABNORMAL HIGH (ref 65–99)
Phosphorus: 1.7 mg/dL — ABNORMAL LOW (ref 2.5–4.6)
Potassium: 2.9 mmol/L — ABNORMAL LOW (ref 3.5–5.1)
Sodium: 132 mmol/L — ABNORMAL LOW (ref 135–145)

## 2015-08-03 LAB — SODIUM, URINE, RANDOM: Sodium, Ur: 26 mmol/L — ABNORMAL LOW (ref 28–272)

## 2015-08-03 LAB — HEPATIC FUNCTION PANEL
ALBUMIN: 2.7 g/dL — AB (ref 3.5–5.0)
ALT: 9 U/L — ABNORMAL LOW (ref 17–63)
AST: 10 U/L — AB (ref 15–41)
Alkaline Phosphatase: 36 U/L — ABNORMAL LOW (ref 38–126)
BILIRUBIN DIRECT: 0.3 mg/dL (ref 0.1–0.5)
BILIRUBIN TOTAL: 1.2 mg/dL (ref 0.3–1.2)
Indirect Bilirubin: 0.9 mg/dL (ref 0.3–0.9)
TOTAL PROTEIN: 5.2 g/dL — AB (ref 6.5–8.1)

## 2015-08-03 LAB — CBC
HEMATOCRIT: 26.9 % — AB (ref 39.0–52.0)
HEMOGLOBIN: 9.3 g/dL — AB (ref 13.0–17.0)
MCH: 33.9 pg (ref 26.0–34.0)
MCHC: 34.6 g/dL (ref 30.0–36.0)
MCV: 98.2 fL (ref 78.0–100.0)
Platelets: 100 10*3/uL — ABNORMAL LOW (ref 150–400)
RBC: 2.74 MIL/uL — ABNORMAL LOW (ref 4.22–5.81)
RDW: 15.9 % — AB (ref 11.5–15.5)
WBC: 1.3 10*3/uL — AB (ref 4.0–10.5)

## 2015-08-03 LAB — BASIC METABOLIC PANEL
Anion gap: 3 — ABNORMAL LOW (ref 5–15)
BUN: 18 mg/dL (ref 6–20)
CO2: 24 mmol/L (ref 22–32)
Calcium: 7.9 mg/dL — ABNORMAL LOW (ref 8.9–10.3)
Chloride: 103 mmol/L (ref 101–111)
Creatinine, Ser: 1.1 mg/dL (ref 0.61–1.24)
Glucose, Bld: 105 mg/dL — ABNORMAL HIGH (ref 65–99)
Potassium: 3 mmol/L — ABNORMAL LOW (ref 3.5–5.1)
SODIUM: 130 mmol/L — AB (ref 135–145)

## 2015-08-03 LAB — OSMOLALITY: Osmolality: 274 mOsm/kg — ABNORMAL LOW (ref 275–300)

## 2015-08-03 LAB — CREATININE, URINE, RANDOM: Creatinine, Urine: 236.2 mg/dL

## 2015-08-03 LAB — OSMOLALITY, URINE: Osmolality, Ur: 839 mOsm/kg (ref 390–1090)

## 2015-08-03 MED ORDER — POTASSIUM CHLORIDE CRYS ER 20 MEQ PO TBCR
40.0000 meq | EXTENDED_RELEASE_TABLET | Freq: Three times a day (TID) | ORAL | Status: DC
  Administered 2015-08-03 (×2): 40 meq via ORAL
  Filled 2015-08-03 (×2): qty 2

## 2015-08-03 MED ORDER — MAGNESIUM SULFATE 2 GM/50ML IV SOLN
2.0000 g | Freq: Once | INTRAVENOUS | Status: AC
  Administered 2015-08-03: 2 g via INTRAVENOUS
  Filled 2015-08-03: qty 50

## 2015-08-03 MED ORDER — SODIUM CHLORIDE 0.9 % IV BOLUS (SEPSIS)
1000.0000 mL | Freq: Once | INTRAVENOUS | Status: AC
  Administered 2015-08-03: 1000 mL via INTRAVENOUS

## 2015-08-03 MED ORDER — DIPHENOXYLATE-ATROPINE 2.5-0.025 MG/5ML PO LIQD
5.0000 mL | Freq: Four times a day (QID) | ORAL | Status: DC | PRN
  Administered 2015-08-03 – 2015-08-04 (×3): 5 mL via ORAL
  Filled 2015-08-03 (×3): qty 5

## 2015-08-03 MED ORDER — K PHOS MONO-SOD PHOS DI & MONO 155-852-130 MG PO TABS
500.0000 mg | ORAL_TABLET | Freq: Four times a day (QID) | ORAL | Status: DC
  Administered 2015-08-03 (×2): 500 mg via ORAL
  Filled 2015-08-03 (×4): qty 2

## 2015-08-03 MED ORDER — DIPHENHYDRAMINE HCL 25 MG PO CAPS
25.0000 mg | ORAL_CAPSULE | Freq: Once | ORAL | Status: AC
  Administered 2015-08-03: 25 mg via ORAL
  Filled 2015-08-03: qty 1

## 2015-08-03 MED ORDER — POTASSIUM CHLORIDE CRYS ER 20 MEQ PO TBCR: 40.0000 meq | EXTENDED_RELEASE_TABLET | Freq: Two times a day (BID) | ORAL | Status: DC

## 2015-08-03 MED ORDER — SODIUM CHLORIDE 0.9 % IV SOLN
INTRAVENOUS | Status: AC
  Administered 2015-08-03: 13:00:00 via INTRAVENOUS
  Filled 2015-08-03: qty 1000

## 2015-08-03 NOTE — Progress Notes (Signed)
[facility] PROGRESS NOTE  Assessment/Plan: Intractable nausea vomiting and diarrhea: He has had no diarrhea, DC C. difficile PCR, and lactoferrin. - full liq diet.  Acute kidney injury: Is improving with IV hydration. Phenol less than 1%. - We'll KVO IV fluids  Protein caloric malnutrition, severe: Start ensure 3 times a day.  Hyponatremia: Likely prerenal in etiology, improving with IV hydration, FENA than 1%.  Hypokalemia/hypomagnesemia: Hyperkalemia slow to improve, will start him on oral potassium and magnesium recheck in the morning.  Normocytic anemia: Stable follow-up with primary care doctor as an outpatient.  Elevated liver enzymes: Negative due to vomiting, now resolved.  Leukopenia: Stable likely due to chemo.  Code Status: full Family Communication: wife  Disposition Plan: home today if K with in normal   Consultants:  none  Procedures:  CXR  Antibiotics:  None  HPI/Subjective: Wants to eat. want to go home.  Objective: Filed Vitals:   08/02/15 1501 08/02/15 1530 08/02/15 2055 08/03/15 0619  BP: 147/72 152/79 119/59 131/67  Pulse: 72  81 73  Temp:  97.7 F (36.5 C) 97.9 F (36.6 C) 97.9 F (36.6 C)  TempSrc:  Oral Oral   Resp: 11 12 14 16   Height:  6\' 6"  (1.981 m)    Weight:  85.957 kg (189 lb 8 oz)  86.637 kg (191 lb)  SpO2: 100% 100% 98% 100%    Intake/Output Summary (Last 24 hours) at 08/03/15 1051 Last data filed at 08/03/15 0700  Gross per 24 hour  Intake 1742.5 ml  Output    625 ml  Net 1117.5 ml   Filed Weights   08/02/15 1530 08/03/15 0619  Weight: 85.957 kg (189 lb 8 oz) 86.637 kg (191 lb)    Exam:  General: Alert, awake, oriented x3, in no acute distress.  HEENT: No bruits, no goiter.  Heart: Regular rate and rhythm. Lungs: Good air movement, clear Abdomen: Soft, nontender, nondistended, positive bowel sounds.  Neuro: Grossly intact, nonfocal.   Data Reviewed: Basic Metabolic Panel:  Recent  Labs Lab 07/28/15 0806 08/02/15 1248 08/02/15 1800 08/03/15 0413  NA 139 128*  --  130*  K 3.4 3.0*  --  3.0*  CL 108 94*  --  103  CO2 23 23  --  24  GLUCOSE 121* 156*  --  105*  BUN 17 21*  --  18  CREATININE 1.3* 1.23 1.09 1.10  CALCIUM 9.3 8.7*  --  7.9*  MG  --   --  1.3*  --    Liver Function Tests:  Recent Labs Lab 07/28/15 0806 08/02/15 1248 08/03/15 0413  AST 23 16 10*  ALT 13 12* 9*  ALKPHOS 50 48 36*  BILITOT 0.80 1.9* 1.2  PROT 6.4 6.5 5.2*  ALBUMIN  --  3.2* 2.7*    Recent Labs Lab 08/02/15 1730  LIPASE 21*   No results for input(s): AMMONIA in the last 168 hours. CBC:  Recent Labs Lab 07/28/15 0806 08/02/15 1248 08/02/15 1800 08/03/15 0413  WBC 2.5* 1.2* 1.3* 1.3*  NEUTROABS 0.6* 0.6*  --   --   HGB 11.9* 11.6* 9.6* 9.3*  HCT 35.1* 32.1* 28.1* 26.9*  MCV 102* 96.4 97.2 98.2  PLT 169 127* 101* 100*   Cardiac Enzymes: No results for input(s): CKTOTAL, CKMB, CKMBINDEX, TROPONINI in the last 168 hours. BNP (last 3 results) No results for input(s): BNP in the last 8760 hours.  ProBNP (last 3 results) No results for input(s): PROBNP in the last  8760 hours.  CBG: No results for input(s): GLUCAP in the last 168 hours.  No results found for this or any previous visit (from the past 240 hour(s)).   Studies: Dg Chest Port 1 View  08/02/2015   CLINICAL DATA:  Esophageal adenocarcinoma status post chemotherapy. Chronic pancreatitis with nausea, vomiting and diarrhea.  EXAM: PORTABLE CHEST - 1 VIEW  COMPARISON:  PET-CT 05/19/2015.  Chest radiographs 11/28/2014.  FINDINGS: 1546 hours. Right IJ Port-A-Cath tip is unchanged at the level of the superior right atrium. The heart size and mediastinal contours are stable. The lungs appear stable with interstitial prominence. No progressive nodularity identified. There is no confluent airspace opacity or pleural effusion. The bones appear unchanged.  IMPRESSION: Radiographically stable appearance of the chest  without progressive nodularity. Port-A-Cath unchanged at the upper right atrial level.   Electronically Signed   By: Richardean Sale M.D.   On: 08/02/2015 16:05    Scheduled Meds: . dronabinol  5 mg Oral BID AC  . enoxaparin (LOVENOX) injection  40 mg Subcutaneous Q24H  . lipase/protease/amylase  36,000 Units Oral TID AC  . magnesium oxide  400 mg Oral BID  . magnesium sulfate 1 - 4 g bolus IVPB  2 g Intravenous Once  . ondansetron  8 mg Oral BID  . pantoprazole (PROTONIX) IV  40 mg Intravenous Q12H  . potassium chloride SA  20 mEq Oral Daily  . potassium chloride  40 mEq Oral BID  . sodium chloride  3 mL Intravenous Q12H  . venlafaxine  37.5 mg Oral BID   Continuous Infusions: . sodium chloride 75 mL/hr at 08/03/15 0631    Time Spent: 25 min   Charlynne Cousins  [facility] Pager [phone]. If 7PM-7AM, please contact night-coverage at [email or web address], password [facility] 08/03/2015, 10:51 AM  LOS: 1 day

## 2015-08-04 LAB — RENAL FUNCTION PANEL
ALBUMIN: 2.9 g/dL — AB (ref 3.5–5.0)
ANION GAP: 6 (ref 5–15)
BUN: 11 mg/dL (ref 6–20)
CO2: 21 mmol/L — AB (ref 22–32)
Calcium: 8.5 mg/dL — ABNORMAL LOW (ref 8.9–10.3)
Chloride: 108 mmol/L (ref 101–111)
Creatinine, Ser: 1.06 mg/dL (ref 0.61–1.24)
GFR calc non Af Amer: >60 mL/min (ref >60)
Glucose, Bld: 125 mg/dL — ABNORMAL HIGH (ref 65–99)
POTASSIUM: 3.1 mmol/L — AB (ref 3.5–5.1)
Phosphorus: 2.1 mg/dL — ABNORMAL LOW (ref 2.5–4.6)
Sodium: 135 mmol/L (ref 135–145)

## 2015-08-04 LAB — MAGNESIUM: Magnesium: 2.1 mg/dL (ref 1.7–2.4)

## 2015-08-04 MED ORDER — POTASSIUM CHLORIDE CRYS ER 20 MEQ PO TBCR: 40.0000 meq | EXTENDED_RELEASE_TABLET | Freq: Two times a day (BID) | ORAL | Status: DC

## 2015-08-04 MED ORDER — POTASSIUM CHLORIDE CRYS ER 20 MEQ PO TBCR: 20.0000 meq | EXTENDED_RELEASE_TABLET | Freq: Every day | ORAL | Status: DC

## 2015-08-04 MED ORDER — POTASSIUM CHLORIDE CRYS ER 20 MEQ PO TBCR
40.0000 meq | EXTENDED_RELEASE_TABLET | Freq: Three times a day (TID) | ORAL | Status: DC
  Administered 2015-08-04: 40 meq via ORAL
  Filled 2015-08-04: qty 2

## 2015-08-04 MED ORDER — HEPARIN SOD (PORK) LOCK FLUSH 100 UNIT/ML IV SOLN
500.0000 [IU] | INTRAVENOUS | Status: AC | PRN
  Administered 2015-08-04: 500 [IU]

## 2015-08-04 MED ORDER — SODIUM CHLORIDE 0.9 % IV SOLN
INTRAVENOUS | Status: DC
  Filled 2015-08-04: qty 1000

## 2015-08-04 MED ORDER — ONDANSETRON 4 MG PO TBDP: 4.0000 mg | ORAL_TABLET | Freq: Three times a day (TID) | ORAL | Status: DC | PRN

## 2015-08-04 NOTE — Progress Notes (Signed)
Completed D/C teaching with patient and family. Answered all questions. Patient will be D/C home with family once port has been de-accesed.  Cathie Hoops, RN

## 2015-08-04 NOTE — Discharge Summary (Addendum)
Physician Discharge Summary  Willie Owens ZOX:[id number] DOB: 09-26-1951 DOA: 08/02/2015  PCP: Chesley Noon, MD  Admit date: 08/02/2015 Discharge date: 08/04/2015  Time spent: 35 minutes  Recommendations for Outpatient Follow-up:  1. Follow up with Dr. Jonette Eva check a b-me tthis week.  Discharge Diagnoses:  Principal Problem:   Intractable nausea and vomiting Active Problems:   Protein-calorie malnutrition, severe   Diarrhea   Hyponatremia   Hypokalemia   Normocytic anemia   Elevated liver enzymes   Discharge Condition: stable  Diet recommendation: regular  Filed Weights   08/02/15 1530 08/03/15 0619 08/04/15 0441  Weight: 85.957 kg (189 lb 8 oz) 86.637 kg (191 lb) 86.682 kg (191 lb 1.6 oz)    History of present illness:  64 y.o. male past medical history of adenocarcinoma of the esophagus status post chemotherapy his last one on Monday, chronic pancreatitis that comes in for nausea vomiting and diarrhea since Tuesday he relates progressively getting worse no hematemesis or melena, he relates no fevers cough or shortness of breath no sick contacts. No recent antibiotic treatment. he says his hemoglobin this morning felt dizzy and fell. Has been no change in medications no new medications. Last every 24 hours his at 3 watery bowel stools. He relates he is anorexic.  Hospital Course:  Intractable nausea and vomiting and diarrhea: He had watery stool throughout his hospital stay, C. difficile was checked which was negative. He was started on Lomotil he tolerated his diet once his vomiting was controlled.  Acute kidney injury: Improved with hydration Lahey prerenal.  Severe protein caloric malnutrition he was started on a short TIA.  Hyponatremia: Likely prerenal in etiology, is improving with IV hydration, his fractional excretion of sodium was less than 1%.  Hypokalemia hypomagnesemia: Slow to improve repleted aggressively, on the day of discharge was 3.1 and the  patient insisted on going home. So he was sent home on potassium 3 times a day 40 mEq and will follow-up with Dr. Lutricia Feil as an outpatient to check a potassium. He relates as an outpatient he's been having trouble repleting his potassium.  Normocytic anemia: Follow-up with his oncologist an outpatient likely due to chemotherapy.  Elevated liver enzymes: This resolved once he stopped having vomiting.   Leukopenia: Likely due to chemotherapy.  Procedures:  CXR (i.e. Studies not automatically included, echos, thoracentesis, etc; not x-rays)  Consultations:  none  Discharge Exam: Filed Vitals:   08/04/15 0441  BP: 140/76  Pulse: 74  Temp: 98.1 F (36.7 C)  Resp: 16    General: A&O x3 Cardiovascular: RRR Respiratory: good air movement CTA B/L  Discharge Instructions   Discharge Instructions    Diet - low sodium heart healthy    Complete by:  As directed      Increase activity slowly    Complete by:  As directed           Current Discharge Medication List    START taking these medications   Details  ondansetron (ZOFRAN ODT) 4 MG disintegrating tablet Take 1 tablet (4 mg total) by mouth every 8 (eight) hours as needed for nausea or vomiting. Qty: 20 tablet, Refills: 0      CONTINUE these medications which have CHANGED   Details  !! potassium chloride SA (K-DUR,KLOR-CON) 20 MEQ tablet Take 2 tablets (40 mEq total) by mouth 2 (two) times daily. Qty: 10 tablet, Refills: 0    !! potassium chloride SA (K-DUR,KLOR-CON) 20 MEQ tablet Take 1 tablet (20 mEq total) by  mouth daily. Qty: 30 tablet, Refills: 6   Associated Diagnoses: Esophageal cancer     !! - Potential duplicate medications found. Please discuss with provider.    CONTINUE these medications which have NOT CHANGED   Details  B Complex-C (SUPER B COMPLEX PO) Take 1 tablet by mouth daily.    benzonatate (TESSALON PERLES) 100 MG capsule Take 1 capsule (100 mg total) by mouth 3 (three) times daily as needed  for cough. Qty: 90 capsule, Refills: 0   Associated Diagnoses: Esophageal cancer    bismuth subsalicylate (PEPTO BISMOL) 262 MG/15ML suspension Take 30 mLs by mouth every 6 (six) hours as needed for diarrhea or loose stools.   Associated Diagnoses: Esophageal cancer    cetirizine (ZYRTEC) 10 MG tablet Take 10 mg by mouth daily as needed for allergies.    Associated Diagnoses: Esophageal cancer    Cholecalciferol (VITAMIN D) 2000 UNITS tablet Take 2,000 Units by mouth daily.    diphenhydrAMINE (BENADRYL) 25 MG tablet Take 25 mg by mouth every 6 (six) hours as needed for allergies.   Associated Diagnoses: Esophageal cancer    diphenoxylate-atropine (LOMOTIL) 2.5-0.025 MG per tablet TAKE 1 TABLET BY MOUTH FOUR TIMES DAILY AS NEEDED FOR DIARRHEA Qty: 60 tablet, Refills: 2    dronabinol (MARINOL) 5 MG capsule Take 1 capsule (5 mg total) by mouth 2 (two) times daily before a meal. Qty: 60 capsule, Refills: 0   Associated Diagnoses: Esophageal cancer; Neuropathic pain of perineum, unspecified laterality; Malignant cachexia    lidocaine-prilocaine (EMLA) cream Apply 1 application topically as needed. Qty: 30 g, Refills: 10    lipase/protease/amylase (CREON) 36000 UNITS CPEP capsule Take 1 capsule (36,000 Units total) by mouth 3 (three) times daily before meals. Qty: 90 capsule, Refills: 4   Associated Diagnoses: Esophageal cancer; Exocrine pancreatic insufficiency    loperamide (IMODIUM A-D) 2 MG tablet Take 2 at onset of diarrhea, then 1 every 2hrs until 12hr without a BM. May take 2 tab every 4hrs at bedtime. If diarrhea recurs repeat. Qty: 100 tablet, Refills: 1   Associated Diagnoses: Esophageal cancer    magnesium oxide (MAG-OX) 400 (241.3 MG) MG tablet Take 1 tablet (400 mg total) by mouth 2 (two) times daily. Qty: 60 tablet, Refills: 6   Associated Diagnoses: Esophageal cancer    Multiple Vitamin (MULTIVITAMIN WITH MINERALS) TABS tablet Take 1 tablet by mouth daily.     omeprazole (PRILOSEC) 40 MG capsule Take 1 capsule (40 mg total) by mouth daily. Qty: 30 capsule, Refills: 6   Associated Diagnoses: Esophageal cancer    ondansetron (ZOFRAN) 8 MG tablet Take 1 tablet (8 mg total) by mouth 2 (two) times daily. Start the day after chemo for 3 days. Then take as needed for nausea or vomiting. Qty: 30 tablet, Refills: 1   Associated Diagnoses: Esophageal cancer    pegfilgrastim (NEULASTA) 6 MG/0.6ML injection Inject 6 mg into the skin once.    PRESCRIPTION MEDICATION Chemo - [facility]    Probiotic Product (ALIGN) 4 MG CAPS Take 1 capsule by mouth daily with breakfast.    Associated Diagnoses: Acute confusional state; Esophageal cancer    prochlorperazine (COMPAZINE) 10 MG tablet Take 10 mg by mouth every 6 (six) hours as needed for nausea or vomiting.     propranolol ER (INDERAL LA) 60 MG 24 hr capsule Take 1 capsule (60 mg total) by mouth at bedtime. Qty: 30 capsule, Refills: 6   Associated Diagnoses: Atrial fibrillation with RVR    pyridOXINE (VITAMIN B-6) 100  MG tablet Take 2 pills at one time once a day. Qty: 60 tablet, Refills: 6   Associated Diagnoses: Neuropathic pain of perineum, unspecified laterality; Esophageal cancer; Malignant cachexia    traZODone (DESYREL) 50 MG tablet Take 1 tablet (50 mg total) by mouth at bedtime. Qty: 30 tablet, Refills: 3   Associated Diagnoses: Insomnia    venlafaxine (EFFEXOR) 37.5 MG tablet TAKE 1 TABLET BY MOUTH TWICE DAILY Qty: 60 tablet, Refills: 0      STOP taking these medications     LORazepam (ATIVAN) 0.5 MG tablet        Allergies  Allergen Reactions  . Penicillins Other (See Comments)    Hallucinations, from childhood   Follow-up Information    Follow up with [name], MD In 2 days.   Specialty:  Oncology   Why:  Chekc electrolytes   Contact information:   [address] [place] [address] [phone]        The results of significant diagnostics from this  hospitalization (including imaging, microbiology, ancillary and laboratory) are listed below for reference.    Significant Diagnostic Studies: Dg Chest Port 1 View  08/02/2015   CLINICAL DATA:  Esophageal adenocarcinoma status post chemotherapy. Chronic pancreatitis with nausea, vomiting and diarrhea.  EXAM: PORTABLE CHEST - 1 VIEW  COMPARISON:  PET-CT 05/19/2015.  Chest radiographs 11/28/2014.  FINDINGS: 1546 hours. Right IJ Port-A-Cath tip is unchanged at the level of the superior right atrium. The heart size and mediastinal contours are stable. The lungs appear stable with interstitial prominence. No progressive nodularity identified. There is no confluent airspace opacity or pleural effusion. The bones appear unchanged.  IMPRESSION: Radiographically stable appearance of the chest without progressive nodularity. Port-A-Cath unchanged at the upper right atrial level.   Electronically Signed   By: Richardean Sale M.D.   On: 08/02/2015 16:05    Microbiology: No results found for this or any previous visit (from the past 240 hour(s)).   Labs: Basic Metabolic Panel:  Recent Labs Lab 07/28/15 0806 08/02/15 1248 08/02/15 1800 08/03/15 0413 08/03/15 1430 08/04/15 0403  NA 139 128*  --  130* 132* 135  K 3.4 3.0*  --  3.0* 2.9* 3.1*  CL 108 94*  --  103 104 108  CO2 23 23  --  24 23 21*  GLUCOSE 121* 156*  --  105* 113* 125*  BUN 17 21*  --  18 13 11   CREATININE 1.3* 1.23 1.09 1.10 0.98 1.06  CALCIUM 9.3 8.7*  --  7.9* 7.8* 8.5*  MG  --   --  1.3*  --   --  2.1  PHOS  --   --   --   --  1.7* 2.1*   Liver Function Tests:  Recent Labs Lab 07/28/15 0806 08/02/15 1248 08/03/15 0413 08/03/15 1430 08/04/15 0403  AST 23 16 10*  --   --   ALT 13 12* 9*  --   --   ALKPHOS 50 48 36*  --   --   BILITOT 0.80 1.9* 1.2  --   --   PROT 6.4 6.5 5.2*  --   --   ALBUMIN  --  3.2* 2.7* 2.5* 2.9*    Recent Labs Lab 08/02/15 1730  LIPASE 21*   No results for input(s): AMMONIA in the last 168  hours. CBC:  Recent Labs Lab 07/28/15 0806 08/02/15 1248 08/02/15 1800 08/03/15 0413  WBC 2.5* 1.2* 1.3* 1.3*  NEUTROABS 0.6* 0.6*  --   --  HGB 11.9* 11.6* 9.6* 9.3*  HCT 35.1* 32.1* 28.1* 26.9*  MCV 102* 96.4 97.2 98.2  PLT 169 127* 101* 100*   Cardiac Enzymes: No results for input(s): CKTOTAL, CKMB, CKMBINDEX, TROPONINI in the last 168 hours. BNP: BNP (last 3 results) No results for input(s): BNP in the last 8760 hours.  ProBNP (last 3 results) No results for input(s): PROBNP in the last 8760 hours.  CBG: No results for input(s): GLUCAP in the last 168 hours.     Signed:  Charlynne Cousins  [facility] 08/04/2015, 7:46 AM

## 2015-08-05 ENCOUNTER — Emergency Department (HOSPITAL_COMMUNITY)
Admission: EM | Admit: 2015-08-05 | Discharge: 2015-08-05 | Disposition: A | Discharge status: Home / Self Care | Payer: 59 | Source: Home / Self Care | Attending: Emergency Medicine | Admitting: Emergency Medicine

## 2015-08-05 ENCOUNTER — Encounter (HOSPITAL_COMMUNITY): Payer: Self-pay | Admitting: Emergency Medicine

## 2015-08-05 ENCOUNTER — Emergency Department (HOSPITAL_COMMUNITY): Payer: 59

## 2015-08-05 DIAGNOSIS — R112 Nausea with vomiting, unspecified: Secondary | ICD-10-CM

## 2015-08-05 DIAGNOSIS — I4891 Unspecified atrial fibrillation: Secondary | ICD-10-CM | POA: Insufficient documentation

## 2015-08-05 DIAGNOSIS — R197 Diarrhea, unspecified: Secondary | ICD-10-CM

## 2015-08-05 DIAGNOSIS — Z87891 Personal history of nicotine dependence: Secondary | ICD-10-CM | POA: Insufficient documentation

## 2015-08-05 DIAGNOSIS — Z79899 Other long term (current) drug therapy: Secondary | ICD-10-CM

## 2015-08-05 DIAGNOSIS — Z8619 Personal history of other infectious and parasitic diseases: Secondary | ICD-10-CM | POA: Insufficient documentation

## 2015-08-05 DIAGNOSIS — Z8739 Personal history of other diseases of the musculoskeletal system and connective tissue: Secondary | ICD-10-CM | POA: Insufficient documentation

## 2015-08-05 DIAGNOSIS — Z880 Allergy status to penicillin: Secondary | ICD-10-CM

## 2015-08-05 DIAGNOSIS — R1084 Generalized abdominal pain: Secondary | ICD-10-CM | POA: Insufficient documentation

## 2015-08-05 DIAGNOSIS — Z8501 Personal history of malignant neoplasm of esophagus: Secondary | ICD-10-CM

## 2015-08-05 DIAGNOSIS — K219 Gastro-esophageal reflux disease without esophagitis: Secondary | ICD-10-CM | POA: Insufficient documentation

## 2015-08-05 DIAGNOSIS — E876 Hypokalemia: Secondary | ICD-10-CM

## 2015-08-05 DIAGNOSIS — N179 Acute kidney failure, unspecified: Secondary | ICD-10-CM | POA: Diagnosis not present

## 2015-08-05 DIAGNOSIS — C801 Malignant (primary) neoplasm, unspecified: Secondary | ICD-10-CM

## 2015-08-05 LAB — COMPREHENSIVE METABOLIC PANEL
ALT: 14 U/L — AB (ref 17–63)
ANION GAP: 12 (ref 5–15)
AST: 20 U/L (ref 15–41)
Albumin: 3.4 g/dL — ABNORMAL LOW (ref 3.5–5.0)
Alkaline Phosphatase: 42 U/L (ref 38–126)
BILIRUBIN TOTAL: 1.1 mg/dL (ref 0.3–1.2)
BUN: 14 mg/dL (ref 6–20)
CALCIUM: 9.4 mg/dL (ref 8.9–10.3)
CO2: 17 mmol/L — AB (ref 22–32)
Chloride: 104 mmol/L (ref 101–111)
Creatinine, Ser: 1.15 mg/dL (ref 0.61–1.24)
Glucose, Bld: 161 mg/dL — ABNORMAL HIGH (ref 65–99)
POTASSIUM: 3.2 mmol/L — AB (ref 3.5–5.1)
SODIUM: 133 mmol/L — AB (ref 135–145)
Total Protein: 6.9 g/dL (ref 6.5–8.1)

## 2015-08-05 LAB — URINALYSIS, ROUTINE W REFLEX MICROSCOPIC
Glucose, UA: NEGATIVE mg/dL
Hgb urine dipstick: NEGATIVE
Ketones, ur: NEGATIVE mg/dL
Nitrite: POSITIVE — AB
PROTEIN: 30 mg/dL — AB
SPECIFIC GRAVITY, URINE: 1.033 — AB (ref 1.005–1.030)
UROBILINOGEN UA: 0.2 mg/dL (ref 0.0–1.0)
pH: 5.5 (ref 5.0–8.0)

## 2015-08-05 LAB — URINE MICROSCOPIC-ADD ON

## 2015-08-05 LAB — LIPASE, BLOOD: Lipase: 49 U/L (ref 22–51)

## 2015-08-05 LAB — CBC
HCT: 33.7 % — ABNORMAL LOW (ref 39.0–52.0)
Hemoglobin: 11.8 g/dL — ABNORMAL LOW (ref 13.0–17.0)
MCH: 34.4 pg — AB (ref 26.0–34.0)
MCHC: 35 g/dL (ref 30.0–36.0)
MCV: 98.3 fL (ref 78.0–100.0)
Platelets: 215 10*3/uL (ref 150–400)
RBC: 3.43 MIL/uL — ABNORMAL LOW (ref 4.22–5.81)
RDW: 16.1 % — ABNORMAL HIGH (ref 11.5–15.5)
WBC: 1.3 10*3/uL — AB (ref 4.0–10.5)

## 2015-08-05 LAB — MAGNESIUM: Magnesium: 1.7 mg/dL (ref 1.7–2.4)

## 2015-08-05 MED ORDER — HEPARIN SOD (PORK) LOCK FLUSH 100 UNIT/ML IV SOLN
500.0000 [IU] | Freq: Once | INTRAVENOUS | Status: AC
  Administered 2015-08-05: 500 [IU]

## 2015-08-05 MED ORDER — DEXTROSE 5 % IV SOLN
1.0000 g | Freq: Once | INTRAVENOUS | Status: AC
  Administered 2015-08-05: 1 g via INTRAVENOUS
  Filled 2015-08-05: qty 10

## 2015-08-05 MED ORDER — METOCLOPRAMIDE HCL 5 MG/ML IJ SOLN
10.0000 mg | INTRAMUSCULAR | Status: AC
  Administered 2015-08-05: 10 mg via INTRAVENOUS
  Filled 2015-08-05: qty 2

## 2015-08-05 MED ORDER — SODIUM CHLORIDE 0.9 % IV BOLUS (SEPSIS)
1000.0000 mL | Freq: Once | INTRAVENOUS | Status: AC
  Administered 2015-08-05: 1000 mL via INTRAVENOUS

## 2015-08-05 MED ORDER — HYDROMORPHONE HCL 1 MG/ML IJ SOLN
1.0000 mg | Freq: Once | INTRAMUSCULAR | Status: AC
  Administered 2015-08-05: 1 mg via INTRAVENOUS
  Filled 2015-08-05: qty 1

## 2015-08-05 MED ORDER — CEPHALEXIN 500 MG PO CAPS: 500.0000 mg | ORAL_CAPSULE | Freq: Two times a day (BID) | ORAL | Status: DC

## 2015-08-05 MED ORDER — MORPHINE SULFATE 4 MG/ML IJ SOLN
4.0000 mg | Freq: Once | INTRAMUSCULAR | Status: AC
  Administered 2015-08-05: 4 mg via INTRAVENOUS
  Filled 2015-08-05: qty 1

## 2015-08-05 MED ORDER — ONDANSETRON HCL 4 MG/2ML IJ SOLN
4.0000 mg | Freq: Once | INTRAMUSCULAR | Status: AC
  Administered 2015-08-05: 4 mg via INTRAVENOUS
  Filled 2015-08-05: qty 2

## 2015-08-05 MED ORDER — IOHEXOL 300 MG/ML  SOLN
100.0000 mL | Freq: Once | INTRAMUSCULAR | Status: AC | PRN
  Administered 2015-08-05: 100 mL via INTRAVENOUS

## 2015-08-05 MED ORDER — HYDROMORPHONE HCL 4 MG PO TABS: 4.0000 mg | ORAL_TABLET | ORAL | Status: DC | PRN

## 2015-08-05 MED ORDER — IOHEXOL 300 MG/ML  SOLN: 25.0000 mL | Freq: Once | INTRAMUSCULAR | Status: DC | PRN

## 2015-08-05 NOTE — ED Notes (Signed)
WBC 1.3  Notified primary nurse

## 2015-08-05 NOTE — ED Notes (Signed)
Unable to collect labs at this time patient wants port access.  I made nurse aware.

## 2015-08-05 NOTE — ED Notes (Signed)
Patient transported to X-ray 

## 2015-08-05 NOTE — ED Provider Notes (Signed)
CSN: [id number]     Arrival date & time 08/05/15  1314 History   First MD Initiated Contact with Patient 08/05/15 1328     Chief Complaint  Patient presents with  . Nausea  . Emesis     (Consider location/radiation/quality/duration/timing/severity/associated sxs/prior Treatment) HPI   Pt with hx adenocarcinoma of the esophagus, chronic pancreatitis, currently undergoing chemotherapy, last treatment 8 days ago, also with injection by oncology to increase blood counts 5 days ago with recent admission 8/6-8/8 for intractable N/V/D, AKI, hypokalemia, hypomagnesemia p/w continued N/V/D and abdominal pain.  Pt was discharged from hospital yesterday and did well until last night when he developed profuse vomiting and diarrhea.  His wife gave him ODT zofran and pt has not had any vomiting since 9am.  His urination has improved since recent hospitalization, is still dark but not as dark as it previously was.  There has been no blood in his emesis or diarrhea, no black stools.  Abdominal pain is located in the LLQ but pt notes "I think that's where the tumors are."  He and wife note pt is very gassy and feeling bloated.  Denies fevers, chills, myalgias, CP, SOB, cough, urinary symptoms.    Past Medical History  Diagnosis Date  . Atrial fibrillation     with RVR while hospitalized in 2014  . Hyperthyroidism     thyrotoxicosis , graves  . H/O [facility] spotted fever   . Gout   . History of antinuclear antibodies   . GERD (gastroesophageal reflux disease)   . Cancer 06/2014    esopageal ca-7/15  . Esophageal cancer 07/26/2014  . Maintenance chemotherapy     every 3 weeks/last chemo  was on 11/14/14  . Portacath in place     right side upper chest   Past Surgical History  Procedure Laterality Date  . Leg surgery      Fx, rod removed/right leg  . Transthoracic echocardiogram  4/292014    EF 55-60%; severe bi-atrial enlargement; RV mod dilated  . Cardioversion N/A 09/21/2013    Procedure:  CARDIOVERSION;  Surgeon: Pixie Casino, MD;  Location: [facility] ENDOSCOPY;  Service: Cardiovascular;  Laterality: N/A;  . Tonsillectomy and adenoidectomy    . Porta cath      right side chest   Family History  Problem Relation Age of Onset  . Pulmonary embolism Neg Hx   . CAD Neg Hx   . Colon cancer Neg Hx   . Stomach cancer Neg Hx   . Esophageal cancer Neg Hx   . Diabetes Mother    History  Substance Use Topics  . Smoking status: Former Smoker -- 2.00 packs/day for 34 years    Types: Cigarettes    Start date: 03/19/1971    Quit date: 12/30/2004  . Smokeless tobacco: Never Used     Comment: quit smoking 9 years ago  . Alcohol Use: No     Comment: vodka daily    Review of Systems  All other systems reviewed and are negative.     Allergies  Penicillins  Home Medications   Prior to Admission medications   Medication Sig Start Date End Date Taking? Authorizing Provider  B Complex-C (SUPER B COMPLEX PO) Take 1 tablet by mouth daily.   Yes Historical Provider, MD  benzonatate (TESSALON PERLES) 100 MG capsule Take 1 capsule (100 mg total) by mouth 3 (three) times daily as needed for cough. 12/06/14  Yes Eliezer Bottom, NP  bismuth subsalicylate (PEPTO BISMOL)  262 MG/15ML suspension Take 30 mLs by mouth every 6 (six) hours as needed for diarrhea or loose stools.   Yes Historical Provider, MD  cetirizine (ZYRTEC) 10 MG tablet Take 10 mg by mouth daily as needed for allergies.    Yes Historical Provider, MD  Cholecalciferol (VITAMIN D) 2000 UNITS tablet Take 2,000 Units by mouth daily.   Yes Historical Provider, MD  diphenhydrAMINE (BENADRYL) 25 MG tablet Take 25 mg by mouth every 6 (six) hours as needed for allergies.   Yes Historical Provider, MD  dronabinol (MARINOL) 5 MG capsule Take 1 capsule (5 mg total) by mouth 2 (two) times daily before a meal. 03/20/15  Yes [name], MD  lidocaine-prilocaine (EMLA) cream Apply 1 application topically as needed. Patient taking  differently: Apply 1 application topically as needed (numbing port every 2 weeks).  07/31/14  Yes [name], MD  lipase/protease/amylase (CREON) 36000 UNITS CPEP capsule Take 1 capsule (36,000 Units total) by mouth 3 (three) times daily before meals. 07/30/15  Yes [name], MD  loperamide (IMODIUM A-D) 2 MG tablet Take 2 at onset of diarrhea, then 1 every 2hrs until 12hr without a BM. May take 2 tab every 4hrs at bedtime. If diarrhea recurs repeat. 06/02/15  Yes [name], MD  magnesium oxide (MAG-OX) 400 (241.3 MG) MG tablet Take 1 tablet (400 mg total) by mouth 2 (two) times daily. 06/16/15  Yes [name], MD  Multiple Vitamin (MULTIVITAMIN WITH MINERALS) TABS tablet Take 1 tablet by mouth daily.   Yes Historical Provider, MD  omeprazole (PRILOSEC) 40 MG capsule Take 1 capsule (40 mg total) by mouth daily. 04/23/15  Yes [name], MD  ondansetron (ZOFRAN ODT) 4 MG disintegrating tablet Take 1 tablet (4 mg total) by mouth every 8 (eight) hours as needed for nausea or vomiting. 08/04/15  Yes Charlynne Cousins, MD  ondansetron (ZOFRAN) 8 MG tablet Take 1 tablet (8 mg total) by mouth 2 (two) times daily. Start the day after chemo for 3 days. Then take as needed for nausea or vomiting. 06/02/15  Yes [name], MD  pegfilgrastim (NEULASTA) 6 MG/0.6ML injection Inject 6 mg into the skin once.   Yes Historical Provider, MD  potassium chloride SA (K-DUR,KLOR-CON) 20 MEQ tablet Take 2 tablets (40 mEq total) by mouth 2 (two) times daily. 08/04/15  Yes Charlynne Cousins, MD  [place]   Yes Historical Provider, MD  Probiotic Product (ALIGN) 4 MG CAPS Take 1 capsule by mouth daily with breakfast.    Yes Historical Provider, MD  prochlorperazine (COMPAZINE) 10 MG tablet Take 10 mg by mouth every 6 (six) hours as needed for nausea or vomiting.    Yes Historical Provider, MD  propranolol ER (INDERAL LA) 60 MG 24 hr capsule Take 1 capsule (60 mg total) by mouth at  bedtime. 01/24/15  Yes [name], MD  pyridOXINE (VITAMIN B-6) 100 MG tablet Take 2 pills at one time once a day. Patient taking differently: Take 200 mg by mouth daily.  03/20/15  Yes [name], MD  traZODone (DESYREL) 50 MG tablet Take 1 tablet (50 mg total) by mouth at bedtime. Patient taking differently: Take 50 mg by mouth at bedtime as needed for sleep.  10/24/14  Yes Eliezer Bottom, NP  venlafaxine (EFFEXOR) 37.5 MG tablet TAKE 1 TABLET BY MOUTH TWICE DAILY Patient taking differently: TAKE 37.5 MG BY MOUTH TWICE DAILY 02/19/15  Yes Collier Salina  Oletha Cruel, MD  diphenoxylate-atropine (LOMOTIL) 2.5-0.025 MG per tablet TAKE 1 TABLET BY MOUTH FOUR TIMES DAILY AS NEEDED FOR DIARRHEA Patient not taking: Reported on 08/05/2015 07/22/15   [name], MD  potassium chloride SA (K-DUR,KLOR-CON) 20 MEQ tablet Take 1 tablet (20 mEq total) by mouth daily. Patient not taking: Reported on 08/05/2015 08/09/15   Charlynne Cousins, MD   BP 121/65 mmHg  SpO2 98% Physical Exam  Constitutional: He appears well-developed and well-nourished. No distress.  HENT:  Head: Normocephalic and atraumatic.  Neck: Neck supple.  Cardiovascular: Normal rate and regular rhythm.   Pulmonary/Chest: Effort normal and breath sounds normal. No respiratory distress. He has no wheezes. He has no rales.  Abdominal: Soft. He exhibits no distension and no mass. There is tenderness in the suprapubic area and left lower quadrant. There is no rebound and no guarding.  Neurological: He is alert. He exhibits normal muscle tone.  Skin: He is not diaphoretic.  Nursing note and vitals reviewed.   ED Course  Procedures (including critical care time) Labs Review Labs Reviewed  COMPREHENSIVE METABOLIC PANEL - Abnormal; Notable for the following:    Sodium 133 (*)    Potassium 3.2 (*)    CO2 17 (*)    Glucose, Bld 161 (*)    Albumin 3.4 (*)    ALT 14 (*)    All other components within normal limits  CBC - Abnormal;  Notable for the following:    WBC 1.3 (*)    RBC 3.43 (*)    Hemoglobin 11.8 (*)    HCT 33.7 (*)    MCH 34.4 (*)    RDW 16.1 (*)    All other components within normal limits  URINALYSIS, ROUTINE W REFLEX MICROSCOPIC (NOT AT [facility]) - Abnormal; Notable for the following:    Color, Urine ORANGE (*)    APPearance CLOUDY (*)    Specific Gravity, Urine 1.033 (*)    Bilirubin Urine SMALL (*)    Protein, ur 30 (*)    Nitrite POSITIVE (*)    Leukocytes, UA TRACE (*)    All other components within normal limits  URINE MICROSCOPIC-ADD ON - Abnormal; Notable for the following:    Bacteria, UA FEW (*)    All other components within normal limits  LIPASE, BLOOD  MAGNESIUM    Imaging Review Dg Abd Acute W/chest  08/05/2015   CLINICAL DATA:  Increasing weakness and nausea, esophageal cancer post chemotherapy 1 day ago, atrial fibrillation, former smoker  EXAM: DG ABDOMEN ACUTE W/ 1V CHEST  COMPARISON:  Chest radiograph 08/02/2015  FINDINGS: RIGHT jugular Port-A-Cath with tip projecting over SVC just above cavoatrial junction.  Normal heart size, mediastinal contours, and pulmonary vascularity.  Lungs emphysematous but clear.  No infiltrate, pleural effusion or pneumothorax.  Mild gaseous distention of the transverse colon.  Paucity of small bowel gas.  No definite bowel wall thickening or free intraperitoneal air.  Osseous structures demineralized.  No urine tract calcification.  IMPRESSION: COPD changes without acute infiltrate.  Gaseous distention of transverse colon, nonspecific, without evidence of bowel obstruction or perforation.   Electronically Signed   By: Lavonia Dana M.D.   On: 08/05/2015 15:07     EKG Interpretation None       4:05 PM I have had a long discussion with the patient and his wife.  Patient strongly desires to go home tonight.  The wife agrees with this and also would like d/c home after hydration because her  mother has two days to live - states it would be fine if she had to  bring her husband back tomorrow.  Pt reports his pain medication at home is not helping and wife has been confused about which nausea medications to give when.  Pt had an appointment with Dr Marin Olp scheduled for tomorrow.   Patient's UA is suspicious for possible UTI with positive nitrites though there are few bacteria and only 0-2 WBC - he is not having urinary symptoms but he is immunocompromised.  Abdominal xray shows large distension without obstruction of transverse colon.  Given this finding and patient's significant symptoms as well as known tumors in the area, I have discussed the benefit of adding a CT abd/pelvis to patient's workup and they agree with this.  I have discussed the patient with Antonietta Breach, PA-C, who assumes care of patient pending CT scan.  As long as there is nothing indicating obstruction or something that needs to be urgently addressed and patient continues to want to go home and is tolerating PO, pt may be d/c home with close follow up with Dr Marin Olp.    MDM   Final diagnoses:  Generalized abdominal pain  Nausea vomiting and diarrhea    Pt with hx cancer, recent development of NVD with recent admission for same.  Please see note above regarding workup and plan.  Pt signed out pending CT abd/pelvis and reassessment.      Clayton Bibles, PA-C 08/05/15 [place], MD 08/06/15 [phone]

## 2015-08-05 NOTE — ED Provider Notes (Signed)
41 - Patient care assumed from [facility], PA-C at change of shift. Patient with CT pending to evaluate air seen in transverse colon on DG abdominal series. Patient's CT today shows enteritis. This is c/w his symptoms of vomiting and diarrhea for which he was recently admitted; suspected to be viral. Patient eager to drink and wanting to go home. No vomiting in the ED. Patient given sprite. Plan to d/c with Keflex and Dilaudid prescribed by prior provider. Patient has f/u with Dr. Marin Olp tomorrow. Return precautions discussed and provided. Patient and spouse agreeable to plan with no unaddressed concerns. Patient discharged in good condition; VSS.   Filed Vitals:   08/05/15 1321 08/05/15 1600 08/05/15 1813  BP: 121/65 133/68 132/61  Pulse:  69 65  Resp:  14 15  SpO2: 98% 97% 100%    Ct Abdomen Pelvis W Contrast  08/05/2015   CLINICAL DATA:  Abdominal pain. Esophageal cancer. Chronic pancreatitis. Nausea, vomiting, diarrhea.  EXAM: CT ABDOMEN AND PELVIS WITH CONTRAST  TECHNIQUE: Multidetector CT imaging of the abdomen and pelvis was performed using the standard protocol following bolus administration of intravenous contrast.  CONTRAST:  166mL OMNIPAQUE IOHEXOL 300 MG/ML  SOLN  COMPARISON:  PET-CT 05/19/2015.  Radiographs 08/05/2015.  FINDINGS: Musculoskeletal:  No aggressive osseous lesions.  Lung Bases: Mild respiratory motion artifact is present. Coronary artery atherosclerosis is present. If office based assessment of coronary risk factors has not been performed, it is now recommended.  In the visible lung bases, there is no evidence of previously seen pulmonary metastatic disease.  Liver: Old granulomatous disease with calcification in the LEFT hepatic dome.  Spleen:  Normal.  Gallbladder:  Normal.  Common bile duct:  Normal.  Pancreas:  Normal.  Adrenal glands:  Normal bilaterally.  Kidneys: Subcentimeter cyst in the LEFT interpolar kidney. Ureters appear normal.  Stomach: Patulous distal thoracic  esophagus with hiatal hernia. No inflammatory changes of stomach. Mild distal esophageal thickening is present.  The upper abdominal lymph nodes seen on prior PET-CT have decreased in size with chemotherapy. Favorable response to treatment is present on upper abdominal adenopathy.  Small bowel: Enteritis is present with mucosal hyperenhancement and fluid filling small bowel. No obstruction.  Colon: Colitis is present with fluid filling the colon diffusely. Mild mucosal hyperenhancement is present. No obstruction. Calcification is present near the appendiceal orifice, probably ingested material.  Pelvic Genitourinary:  Urinary bladder appears normal.  Peritoneum: No free fluid.  No free air.  Vascular/lymphatic: Atherosclerosis.  No acute vascular abnormality.  Body Wall: Fat containing RIGHT inguinal hernia.  IMPRESSION: 1. Diffuse enteritis of small bowel and colon. 2. Decrease in size of regional metastatic disease from esophageal carcinoma.   Electronically Signed   By: Dereck Ligas M.D.   On: 08/05/2015 17:40     Antonietta Breach, PA-C 08/05/15 1901  Daleen Bo, MD 08/06/15 1339

## 2015-08-05 NOTE — ED Notes (Signed)
Patient transported to CT 

## 2015-08-05 NOTE — Discharge Instructions (Signed)
Read the information below.  Use the prescribed medication as directed.  Please discuss all new medications with your pharmacist.  You may return to the Emergency Department at any time for worsening condition or any new symptoms that concern you.    If you develop high fevers, worsening abdominal pain, uncontrolled vomiting, or are unable to tolerate fluids by mouth, return to the ER for a recheck.   You have two different medications at home for nausea.  Zofran is a medication that dissolves under the tongue.  You can use it as needed every 8 hours.    You also have Compazine.  You you also use this every 8 hours if needed for nausea and vomiting.

## 2015-08-05 NOTE — ED Notes (Signed)
Placed pt on neutropenic precautions

## 2015-08-05 NOTE — ED Notes (Signed)
Pt c/o nausea and emesis x several months, last chemo Monday.

## 2015-08-06 ENCOUNTER — Encounter (HOSPITAL_COMMUNITY): Payer: Self-pay

## 2015-08-06 ENCOUNTER — Ambulatory Visit: Payer: 59

## 2015-08-06 ENCOUNTER — Telehealth: Payer: Self-pay | Admitting: *Deleted

## 2015-08-06 ENCOUNTER — Telehealth: Payer: Self-pay | Admitting: Hematology & Oncology

## 2015-08-06 ENCOUNTER — Other Ambulatory Visit: Payer: 59

## 2015-08-06 ENCOUNTER — Ambulatory Visit: Payer: 59 | Admitting: Family

## 2015-08-06 ENCOUNTER — Inpatient Hospital Stay (HOSPITAL_COMMUNITY)
Admission: EM | Admit: 2015-08-06 | Discharge: 2015-08-12 | DRG: 683 | Disposition: A | Discharge status: Home Health | Payer: 59 | Attending: Internal Medicine | Admitting: Internal Medicine

## 2015-08-06 DIAGNOSIS — C159 Malignant neoplasm of esophagus, unspecified: Secondary | ICD-10-CM | POA: Diagnosis present

## 2015-08-06 DIAGNOSIS — Z833 Family history of diabetes mellitus: Secondary | ICD-10-CM

## 2015-08-06 DIAGNOSIS — K529 Noninfective gastroenteritis and colitis, unspecified: Secondary | ICD-10-CM | POA: Diagnosis present

## 2015-08-06 DIAGNOSIS — R1110 Vomiting, unspecified: Secondary | ICD-10-CM

## 2015-08-06 DIAGNOSIS — N179 Acute kidney failure, unspecified: Principal | ICD-10-CM | POA: Diagnosis present

## 2015-08-06 DIAGNOSIS — I4891 Unspecified atrial fibrillation: Secondary | ICD-10-CM | POA: Diagnosis present

## 2015-08-06 DIAGNOSIS — E871 Hypo-osmolality and hyponatremia: Secondary | ICD-10-CM | POA: Diagnosis present

## 2015-08-06 DIAGNOSIS — E860 Dehydration: Secondary | ICD-10-CM | POA: Diagnosis present

## 2015-08-06 DIAGNOSIS — E872 Acidosis: Secondary | ICD-10-CM | POA: Diagnosis present

## 2015-08-06 DIAGNOSIS — Z87891 Personal history of nicotine dependence: Secondary | ICD-10-CM

## 2015-08-06 DIAGNOSIS — R1010 Upper abdominal pain, unspecified: Secondary | ICD-10-CM

## 2015-08-06 DIAGNOSIS — Z9221 Personal history of antineoplastic chemotherapy: Secondary | ICD-10-CM

## 2015-08-06 DIAGNOSIS — E876 Hypokalemia: Secondary | ICD-10-CM | POA: Diagnosis present

## 2015-08-06 DIAGNOSIS — N19 Unspecified kidney failure: Secondary | ICD-10-CM

## 2015-08-06 DIAGNOSIS — I248 Other forms of acute ischemic heart disease: Secondary | ICD-10-CM | POA: Diagnosis present

## 2015-08-06 DIAGNOSIS — D709 Neutropenia, unspecified: Secondary | ICD-10-CM | POA: Diagnosis present

## 2015-08-06 DIAGNOSIS — R197 Diarrhea, unspecified: Secondary | ICD-10-CM | POA: Diagnosis present

## 2015-08-06 DIAGNOSIS — B370 Candidal stomatitis: Secondary | ICD-10-CM | POA: Diagnosis present

## 2015-08-06 DIAGNOSIS — R112 Nausea with vomiting, unspecified: Secondary | ICD-10-CM

## 2015-08-06 NOTE — Telephone Encounter (Signed)
NPR-[employer] for Neulasta (supportive drug)     [id number] pegfilgrastim 6 MG/0.6ML Soln:  Dos: 07/30/2015

## 2015-08-06 NOTE — ED Provider Notes (Signed)
CSN: [id number]     Arrival date & time 08/06/15  2231 History  This chart was scribed for Willie Porter, MD by Hansel Feinstein, ED Scribe. This patient was seen in room WA13/WA13 and the patient's care was started at 12:03 AM.     Chief Complaint  Patient presents with  . Abdominal Pain   The history is provided by the patient. No language interpreter was used.   HPI Comments: Willie Owens is a 64 y.o. male with Hx of Afib, hyperthyroidism, stage IV esophageal cancer, GERD, chemotherapy who presents to the Emergency Department complaining of moderate, burning abdominal pain onset yesterday and worsened tonight. He states associated cough, nausea, one episode of emesis, improving diarrhea (5x a day), increased SOB. He states he is unaware that he is in atrial fibrillation and denies chest pain, lightheadedness or moderate shortness of breath. He notes that his stool is bright green. Patient states he felt acutely worse tonight about 9:30 PM. Pt was admitted to the hospital 4 days ago for dehydration and his wife reports he had elevated liver tests and decreased renal function. He had a Foley catheter inserted at that time.Willie Owens He was then seen in the ED on 08/04/15 for diarrhea, nausea, abdominal pain and was given an Rx for antibiotics and pain medication. He attributes the onset of his recent symptoms to chemotherapy last week. Pt states he is not aware when he is having episodes of Afib. His thyroid medicine was discontinued 4 months ago. His wife reports he had atrial fibrillation before when his thyroid was out of balance. He denies lightheadedness, CP, fever.   Cardiologist Dr. Lyman Bishop Oncologist Dr. Marin Olp PCP Dr. Melford Aase   Past Medical History  Diagnosis Date  . Atrial fibrillation     with RVR while hospitalized in 2014  . Hyperthyroidism     thyrotoxicosis , graves  . H/O [facility] spotted fever   . Gout   . History of antinuclear antibodies   . GERD (gastroesophageal reflux  disease)   . Cancer 06/2014    esopageal ca-7/15  . Esophageal cancer 07/26/2014  . Maintenance chemotherapy     every 3 weeks/last chemo  was on 11/14/14  . Portacath in place     right side upper chest   Past Surgical History  Procedure Laterality Date  . Leg surgery      Fx, rod removed/right leg  . Transthoracic echocardiogram  4/292014    EF 55-60%; severe bi-atrial enlargement; RV mod dilated  . Cardioversion N/A 09/21/2013    Procedure: CARDIOVERSION;  Surgeon: Pixie Casino, MD;  [facility]: [facility] ENDOSCOPY;  Service: Cardiovascular;  Laterality: N/A;  . Tonsillectomy and adenoidectomy    . Porta cath      right side chest   Family History  Problem Relation Age of Onset  . Pulmonary embolism Neg Hx   . CAD Neg Hx   . Colon cancer Neg Hx   . Stomach cancer Neg Hx   . Esophageal cancer Neg Hx   . Diabetes Mother    Social History  Substance Use Topics  . Smoking status: Former Smoker -- 2.00 packs/day for 34 years    Types: Cigarettes    Start date: 03/19/1971    Quit date: 12/30/2004  . Smokeless tobacco: Never Used     Comment: quit smoking 9 years ago  . Alcohol Use: No     Comment: vodka daily  lives at home Lives with spouse  Review of Systems  Constitutional: Negative for fever.  Respiratory: Positive for cough and shortness of breath.   Cardiovascular: Negative for chest pain.  Gastrointestinal: Positive for nausea, vomiting, abdominal pain and diarrhea.  Neurological: Negative for light-headedness.  All other systems reviewed and are negative.  Allergies  Penicillins  Home Medications   Prior to Admission medications   Medication Sig Start Date End Date Taking? Authorizing Provider  B Complex-C (SUPER B COMPLEX PO) Take 1 tablet by mouth daily.   Yes Historical Provider, MD  benzonatate (TESSALON PERLES) 100 MG capsule Take 1 capsule (100 mg total) by mouth 3 (three) times daily as needed for cough. 12/06/14  Yes Eliezer Bottom, NP  bismuth  subsalicylate (PEPTO BISMOL) 262 MG/15ML suspension Take 30 mLs by mouth every 6 (six) hours as needed for diarrhea or loose stools.   Yes Historical Provider, MD  cetirizine (ZYRTEC) 10 MG tablet Take 10 mg by mouth daily as needed for allergies.    Yes Historical Provider, MD  Cholecalciferol (VITAMIN D) 2000 UNITS tablet Take 2,000 Units by mouth daily.   Yes Historical Provider, MD  diphenhydrAMINE (BENADRYL) 25 MG tablet Take 25 mg by mouth every 6 (six) hours as needed for allergies.   Yes Historical Provider, MD  diphenoxylate-atropine (LOMOTIL) 2.5-0.025 MG per tablet TAKE 1 TABLET BY MOUTH FOUR TIMES DAILY AS NEEDED FOR DIARRHEA 07/22/15  Yes [name], MD  dronabinol (MARINOL) 5 MG capsule Take 1 capsule (5 mg total) by mouth 2 (two) times daily before a meal. 03/20/15  Yes [name], MD  HYDROmorphone (DILAUDID) 4 MG tablet Take 1 tablet (4 mg total) by mouth every 4 (four) hours as needed for severe pain. 08/05/15  Yes Clayton Bibles, PA-C  lidocaine-prilocaine (EMLA) cream Apply 1 application topically as needed. Patient taking differently: Apply 1 application topically as needed (numbing port every 2 weeks).  07/31/14  Yes [name], MD  lipase/protease/amylase (CREON) 36000 UNITS CPEP capsule Take 1 capsule (36,000 Units total) by mouth 3 (three) times daily before meals. 07/30/15  Yes [name], MD  loperamide (IMODIUM A-D) 2 MG tablet Take 2 at onset of diarrhea, then 1 every 2hrs until 12hr without a BM. May take 2 tab every 4hrs at bedtime. If diarrhea recurs repeat. 06/02/15  Yes [name], MD  magnesium oxide (MAG-OX) 400 (241.3 MG) MG tablet Take 1 tablet (400 mg total) by mouth 2 (two) times daily. 06/16/15  Yes [name], MD  Multiple Vitamin (MULTIVITAMIN WITH MINERALS) TABS tablet Take 1 tablet by mouth daily.   Yes Historical Provider, MD  omeprazole (PRILOSEC) 40 MG capsule Take 1 capsule (40 mg total) by mouth daily. 04/23/15  Yes [name], MD   ondansetron (ZOFRAN) 8 MG tablet Take 1 tablet (8 mg total) by mouth 2 (two) times daily. Start the day after chemo for 3 days. Then take as needed for nausea or vomiting. 06/02/15  Yes [name], MD  pegfilgrastim (NEULASTA) 6 MG/0.6ML injection Inject 6 mg into the skin once.   Yes Historical Provider, MD  potassium chloride SA (K-DUR,KLOR-CON) 20 MEQ tablet Take 2 tablets (40 mEq total) by mouth 2 (two) times daily. 08/04/15  Yes Charlynne Cousins, MD  [place]   Yes Historical Provider, MD  Probiotic Product (ALIGN) 4 MG CAPS Take 1 capsule by mouth daily with breakfast.    Yes Historical Provider, MD  prochlorperazine (COMPAZINE) 10 MG tablet Take 10 mg by  mouth every 6 (six) hours as needed for nausea or vomiting.    Yes Historical Provider, MD  propranolol ER (INDERAL LA) 60 MG 24 hr capsule Take 1 capsule (60 mg total) by mouth at bedtime. 01/24/15  Yes [name], MD  pyridOXINE (VITAMIN B-6) 100 MG tablet Take 2 pills at one time once a day. Patient taking differently: Take 200 mg by mouth daily.  03/20/15  Yes [name], MD  traZODone (DESYREL) 50 MG tablet Take 1 tablet (50 mg total) by mouth at bedtime. Patient taking differently: Take 50 mg by mouth at bedtime as needed for sleep.  10/24/14  Yes Eliezer Bottom, NP  venlafaxine (EFFEXOR) 37.5 MG tablet TAKE 1 TABLET BY MOUTH TWICE DAILY Patient taking differently: TAKE 37.5 MG BY MOUTH TWICE DAILY 02/19/15  Yes [name], MD  ondansetron (ZOFRAN ODT) 4 MG disintegrating tablet Take 1 tablet (4 mg total) by mouth every 8 (eight) hours as needed for nausea or vomiting. 08/04/15   Charlynne Cousins, MD   BP 124/77 mmHg  Pulse 150  Resp 24  SpO2 100%/BP 96/77  Vital signs normal except for tachycardia and hypotension  Physical Exam  Constitutional: He is oriented to person, place, and time.  Non-toxic appearance. He does not appear ill. No distress.  Appears chronically ill.    HENT:  Head: Normocephalic and atraumatic.  Right Ear: External ear normal.  Left Ear: External ear normal.  Nose: Nose normal. No mucosal edema or rhinorrhea.  Mouth/Throat: Oropharynx is clear and moist and mucous membranes are normal. No dental abscesses or uvula swelling.  Eyes: Conjunctivae and EOM are normal. Pupils are equal, round, and reactive to light.  Neck: Normal range of motion and full passive range of motion without pain. Neck supple.  Cardiovascular: An irregularly irregular rhythm present. Tachycardia present.  Exam reveals no gallop and no friction rub.   No murmur heard. Pulmonary/Chest: Effort normal and breath sounds normal. No respiratory distress. He has no wheezes. He has no rhonchi. He has no rales. He exhibits no tenderness and no crepitus.  Abdominal: Soft. Normal appearance and bowel sounds are normal. He exhibits no distension. There is tenderness. There is no rebound and no guarding.  Intermittent high pitched tinkling sounds. Tenderness in the epigastric region and LUQ.   Musculoskeletal: Normal range of motion. He exhibits no edema or tenderness.  Moves all extremities well.   Neurological: He is alert and oriented to person, place, and time. He has normal strength. No cranial nerve deficit.  Skin: Skin is warm, dry and intact. No rash noted. No erythema. No pallor.  Psychiatric: He has a normal mood and affect. His speech is normal and behavior is normal. His mood appears not anxious.  Nursing note and vitals reviewed.  ED Course  Procedures (including critical care time)  Medications  diltiazem (CARDIZEM) 100 mg in dextrose 5 % 100 mL (1 mg/mL) infusion (5 mg/hr Intravenous New Bag/Given 08/07/15 0218)  ciprofloxacin (CIPRO) IVPB 400 mg (400 mg Intravenous New Bag/Given 08/07/15 0515)  metroNIDAZOLE (FLAGYL) IVPB 500 mg (0 mg Intravenous Stopped 08/07/15 0521)  diltiazem (CARDIZEM) 1 mg/mL load via infusion 5 mg (5 mg Intravenous Given 08/07/15 0221)   fentaNYL (SUBLIMAZE) injection 50 mcg (50 mcg Intravenous Given 08/07/15 0204)    DIAGNOSTIC STUDIES: Oxygen Saturation is 100% on RA, normal by my interpretation.    COORDINATION OF CARE: 12:19 AM Discussed treatment plan with pt at bedside and pt agreed to  plan. Due to patient's hypotension with blood pressure in the 90s he was given IV bolus of fluid and then started on a diltiazem bolus and drip for his atrial fibrillation with fast ventricular response. He was given IV pain medication. After reviewing his CT scan from August 9 which showed diffuse inflammatory changes of the small and large bowel consistent with enteritis he was started on IV Cipro and Flagyl. Because of his recent CT scan he had plain x-rays ordered tonight.  3:38 AM Pt reevaluated. Pt still in Afib with heart rate in the 70's. BP shows improvement, at 106/65. Pt reports that abdominal pain has improved. Discussed lab results. Ordered additional lab work. Discussed low Potassium and pt requested IV potassium rather than oral due to difficulty swallowing. Discussed normal thyroid and magnesium findings. Discussed normal CXR findings. Will prescribe antibiotics. Pt to be admitted.   4:30 AM Dr. Alcario Drought, admit to stepdown  Labs Review Results for orders placed or performed during the hospital encounter of 08/06/15  Culture, blood (routine x 2)  Result Value Ref Range   Specimen Description BLOOD RIGHT WRIST    Special Requests BOTTLES DRAWN AEROBIC AND ANAEROBIC 5CC    Culture PENDING    Report Status PENDING   Comprehensive metabolic panel  Result Value Ref Range   Sodium 134 (L) 135 - 145 mmol/L   Potassium 2.8 (L) 3.5 - 5.1 mmol/L   Chloride 106 101 - 111 mmol/L   CO2 13 (L) 22 - 32 mmol/L   Glucose, Bld 146 (H) 65 - 99 mg/dL   BUN 22 (H) 6 - 20 mg/dL   Creatinine, Ser 1.58 (H) 0.61 - 1.24 mg/dL   Calcium 10.3 8.9 - 10.3 mg/dL   Total Protein 7.4 6.5 - 8.1 g/dL   Albumin 3.7 3.5 - 5.0 g/dL   AST 19 15 - 41 U/L    ALT 18 17 - 63 U/L   Alkaline Phosphatase 52 38 - 126 U/L   Total Bilirubin 0.8 0.3 - 1.2 mg/dL   GFR calc non Af Amer 45 (L) >60 mL/min   GFR calc Af Amer 52 (L) >60 mL/min   Anion gap 15 5 - 15  CBC with Differential  Result Value Ref Range   WBC 9.3 4.0 - 10.5 K/uL   RBC 3.63 (L) 4.22 - 5.81 MIL/uL   Hemoglobin 12.8 (L) 13.0 - 17.0 g/dL   HCT 35.5 (L) 39.0 - 52.0 %   MCV 97.8 78.0 - 100.0 fL   MCH 35.3 (H) 26.0 - 34.0 pg   MCHC 36.1 (H) 30.0 - 36.0 g/dL   RDW 16.3 (H) 11.5 - 15.5 %   Platelets 327 150 - 400 K/uL   Neutrophils Relative % 56 43 - 77 %   Lymphocytes Relative 23 12 - 46 %   Monocytes Relative 21 (H) 3 - 12 %   Eosinophils Relative 0 0 - 5 %   Basophils Relative 0 0 - 1 %   Neutro Abs 5.2 1.7 - 7.7 K/uL   Lymphs Abs 2.1 0.7 - 4.0 K/uL   Monocytes Absolute 2.0 (H) 0.1 - 1.0 K/uL   Eosinophils Absolute 0.0 0.0 - 0.7 K/uL   Basophils Absolute 0.0 0.0 - 0.1 K/uL   WBC Morphology MILD LEFT SHIFT (1-5% METAS, OCC MYELO, OCC BANDS)   TSH  Result Value Ref Range   TSH 2.854 0.350 - 4.500 uIU/mL  Magnesium  Result Value Ref Range   Magnesium 2.0 1.7 - 2.4 mg/dL  I-stat troponin, ED  Result Value Ref Range   Troponin i, poc 0.15 (HH) 0.00 - 0.08 ng/mL   Comment NOTIFIED PHYSICIAN    Comment 3          I-Stat CG4 Lactic Acid, ED  Result Value Ref Range   Lactic Acid, Venous 2.54 (HH) 0.5 - 2.0 mmol/L   Comment NOTIFIED PHYSICIAN   I-stat troponin, ED  Result Value Ref Range   Troponin i, poc 0.19 (HH) 0.00 - 0.08 ng/mL   Comment NOTIFIED PHYSICIAN    Comment 3           Laboratory interpretation all normal except positive stable troponins 2, elevated lactic acid, hypokalemia consistent with his history of diarrhea     Imaging Review Ct Abdomen Pelvis W Contrast  08/05/2015   CLINICAL DATA:  Abdominal pain. Esophageal cancer. Chronic pancreatitis. Nausea, vomiting, diarrhea. .  IMPRESSION: 1. Diffuse enteritis of small bowel and colon. 2. Decrease in size  of regional metastatic disease from esophageal carcinoma.   Electronically Signed   By: Dereck Ligas M.D.   On: 08/05/2015 17:40   Dg Abd Acute W/chest  08/05/2015   CLINICAL DATA:  Increasing weakness and nausea, esophageal cancer post chemotherapy 1 day ago, atrial fibrillation, former smoker  EXAM: DG ABDOMEN ACUTE W/ 1V CHEST  COMPARISON:  Chest radiograph 08/02/2015  FINDINGS: RIGHT jugular Port-A-Cath with tip projecting over SVC just above cavoatrial junction.  Normal heart size, mediastinal contours, and pulmonary vascularity.  Lungs emphysematous but clear.  No infiltrate, pleural effusion or pneumothorax.  Mild gaseous distention of the transverse colon.  Paucity of small bowel gas.  No definite bowel wall thickening or free intraperitoneal air.  Osseous structures demineralized.  No urine tract calcification.  IMPRESSION: COPD changes without acute infiltrate.  Gaseous distention of transverse colon, nonspecific, without evidence of bowel obstruction or perforation.   Electronically Signed   By: Lavonia Dana M.D.   On: 08/05/2015 15:07     EKG Interpretation   Date/Time:  Wednesday August 06 2015 23:22:49 EDT Ventricular Rate:  153 PR Interval:    QRS Duration: 103 QT Interval:  300 QTC Calculation: 479 R Axis:   -96 Text Interpretation:  Atrial flutter with predominant 2:1 AV block LAD,  consider left anterior fascicular block Since last tracing 03 Aug 2015  HEART RATE has increased Repolarization abnormality, prob rate related  Minimal ST elevation, lateral leads Confirmed by Jose Corvin  MD-I, Takila Kronberg ([id number])  on 08/06/2015 11:53:07 PM      MDM   Final diagnoses:  Upper abdominal pain  Enteritis  Atrial fibrillation with rapid ventricular response  Nausea vomiting and diarrhea  Hypokalemia   Plan admission   Willie Porter, MD, FACEP  CRITICAL CARE Performed by: Willie Owens L Total critical care time: 45 min Critical care time was exclusive of separately billable procedures and  treating other patients. Critical care was necessary to treat or prevent imminent or life-threatening deterioration. Critical care was time spent personally by me on the following activities: development of treatment plan with patient and/or surrogate as well as nursing, discussions with consultants, evaluation of patient's response to treatment, examination of patient, obtaining history from patient or surrogate, ordering and performing treatments and interventions, ordering and review of laboratory studies, ordering and review of radiographic studies, pulse oximetry and re-evaluation of patient's condition.     I personally performed the services described in this documentation, which was scribed in my presence. The recorded information has been reviewed and  considered.  Willie Porter, MD, Barbette Or, MD 08/07/15 [phone]

## 2015-08-06 NOTE — ED Notes (Signed)
Pt was seen here last night and was sent home, was doing ok at home and then started having abdominal pain tonight

## 2015-08-06 NOTE — Telephone Encounter (Signed)
Wife stating that patient was in the ED last night. He had labs drawn and fluids administered. Today he feels much better and his only significant complaint is exhaustion for being at the ED all afternoon/evening yesterday. She wants to cancel today's appointment. She denies patient need for fluids. She would rather the patient stay home and rest. Dr Marin Olp is fine with this. Appointments for today cancelled.

## 2015-08-07 ENCOUNTER — Emergency Department (HOSPITAL_COMMUNITY): Payer: 59

## 2015-08-07 ENCOUNTER — Encounter: Payer: Self-pay | Admitting: Hematology & Oncology

## 2015-08-07 DIAGNOSIS — Z833 Family history of diabetes mellitus: Secondary | ICD-10-CM | POA: Diagnosis not present

## 2015-08-07 DIAGNOSIS — R197 Diarrhea, unspecified: Secondary | ICD-10-CM

## 2015-08-07 DIAGNOSIS — E876 Hypokalemia: Secondary | ICD-10-CM | POA: Diagnosis not present

## 2015-08-07 DIAGNOSIS — E872 Acidosis: Secondary | ICD-10-CM | POA: Diagnosis present

## 2015-08-07 DIAGNOSIS — R1110 Vomiting, unspecified: Secondary | ICD-10-CM

## 2015-08-07 DIAGNOSIS — Z9221 Personal history of antineoplastic chemotherapy: Secondary | ICD-10-CM | POA: Diagnosis not present

## 2015-08-07 DIAGNOSIS — I4891 Unspecified atrial fibrillation: Secondary | ICD-10-CM | POA: Insufficient documentation

## 2015-08-07 DIAGNOSIS — Z87891 Personal history of nicotine dependence: Secondary | ICD-10-CM | POA: Diagnosis not present

## 2015-08-07 DIAGNOSIS — D709 Neutropenia, unspecified: Secondary | ICD-10-CM | POA: Diagnosis present

## 2015-08-07 DIAGNOSIS — E871 Hypo-osmolality and hyponatremia: Secondary | ICD-10-CM

## 2015-08-07 DIAGNOSIS — C155 Malignant neoplasm of lower third of esophagus: Secondary | ICD-10-CM | POA: Diagnosis not present

## 2015-08-07 DIAGNOSIS — E860 Dehydration: Secondary | ICD-10-CM | POA: Diagnosis present

## 2015-08-07 DIAGNOSIS — K529 Noninfective gastroenteritis and colitis, unspecified: Secondary | ICD-10-CM | POA: Diagnosis present

## 2015-08-07 DIAGNOSIS — I248 Other forms of acute ischemic heart disease: Secondary | ICD-10-CM | POA: Diagnosis present

## 2015-08-07 DIAGNOSIS — C159 Malignant neoplasm of esophagus, unspecified: Secondary | ICD-10-CM | POA: Diagnosis present

## 2015-08-07 DIAGNOSIS — N19 Unspecified kidney failure: Secondary | ICD-10-CM | POA: Diagnosis not present

## 2015-08-07 DIAGNOSIS — N179 Acute kidney failure, unspecified: Secondary | ICD-10-CM | POA: Diagnosis present

## 2015-08-07 DIAGNOSIS — R112 Nausea with vomiting, unspecified: Secondary | ICD-10-CM | POA: Diagnosis present

## 2015-08-07 DIAGNOSIS — B370 Candidal stomatitis: Secondary | ICD-10-CM | POA: Diagnosis present

## 2015-08-07 LAB — CBC WITH DIFFERENTIAL/PLATELET
BASOS ABS: 0 10*3/uL (ref 0.0–0.1)
Basophils Relative: 0 % (ref 0–1)
EOS ABS: 0 10*3/uL (ref 0.0–0.7)
Eosinophils Relative: 0 % (ref 0–5)
HCT: 35.5 % — ABNORMAL LOW (ref 39.0–52.0)
Hemoglobin: 12.8 g/dL — ABNORMAL LOW (ref 13.0–17.0)
LYMPHS ABS: 2.1 10*3/uL (ref 0.7–4.0)
LYMPHS PCT: 23 % (ref 12–46)
MCH: 35.3 pg — AB (ref 26.0–34.0)
MCHC: 36.1 g/dL — ABNORMAL HIGH (ref 30.0–36.0)
MCV: 97.8 fL (ref 78.0–100.0)
MONOS PCT: 21 % — AB (ref 3–12)
Monocytes Absolute: 2 10*3/uL — ABNORMAL HIGH (ref 0.1–1.0)
Neutro Abs: 5.2 10*3/uL (ref 1.7–7.7)
Neutrophils Relative %: 56 % (ref 43–77)
PLATELETS: 327 10*3/uL (ref 150–400)
RBC: 3.63 MIL/uL — ABNORMAL LOW (ref 4.22–5.81)
RDW: 16.3 % — AB (ref 11.5–15.5)
WBC: 9.3 10*3/uL (ref 4.0–10.5)

## 2015-08-07 LAB — COMPREHENSIVE METABOLIC PANEL
ALT: 18 U/L (ref 17–63)
ANION GAP: 15 (ref 5–15)
AST: 19 U/L (ref 15–41)
Albumin: 3.7 g/dL (ref 3.5–5.0)
Alkaline Phosphatase: 52 U/L (ref 38–126)
BUN: 22 mg/dL — AB (ref 6–20)
CALCIUM: 10.3 mg/dL (ref 8.9–10.3)
CO2: 13 mmol/L — AB (ref 22–32)
Chloride: 106 mmol/L (ref 101–111)
Creatinine, Ser: 1.58 mg/dL — ABNORMAL HIGH (ref 0.61–1.24)
GFR calc Af Amer: 52 mL/min — ABNORMAL LOW (ref >60)
GFR, EST NON AFRICAN AMERICAN: 45 mL/min — AB (ref >60)
Glucose, Bld: 146 mg/dL — ABNORMAL HIGH (ref 65–99)
Potassium: 2.8 mmol/L — ABNORMAL LOW (ref 3.5–5.1)
SODIUM: 134 mmol/L — AB (ref 135–145)
Total Bilirubin: 0.8 mg/dL (ref 0.3–1.2)
Total Protein: 7.4 g/dL (ref 6.5–8.1)

## 2015-08-07 LAB — MRSA PCR SCREENING: MRSA BY PCR: NEGATIVE

## 2015-08-07 LAB — TROPONIN I
TROPONIN I: 0.13 ng/mL — AB (ref <0.031)
TROPONIN I: 0.06 ng/mL — AB (ref <0.031)
Troponin I: 0.09 ng/mL — ABNORMAL HIGH (ref <0.031)

## 2015-08-07 LAB — TSH: TSH: 2.854 u[IU]/mL (ref 0.350–4.500)

## 2015-08-07 LAB — I-STAT CG4 LACTIC ACID, ED: Lactic Acid, Venous: 2.54 mmol/L (ref 0.5–2.0)

## 2015-08-07 LAB — I-STAT TROPONIN, ED
Troponin i, poc: 0.15 ng/mL (ref 0.00–0.08)
Troponin i, poc: 0.19 ng/mL (ref 0.00–0.08)

## 2015-08-07 LAB — MAGNESIUM: Magnesium: 2 mg/dL (ref 1.7–2.4)

## 2015-08-07 MED ORDER — TRAZODONE HCL 50 MG PO TABS
50.0000 mg | ORAL_TABLET | Freq: Every evening | ORAL | Status: DC | PRN
  Administered 2015-08-09: 50 mg via ORAL
  Filled 2015-08-07: qty 1

## 2015-08-07 MED ORDER — PANCRELIPASE (LIP-PROT-AMYL) 36000-114000 UNITS PO CPEP
36000.0000 [IU] | ORAL_CAPSULE | Freq: Three times a day (TID) | ORAL | Status: DC
  Administered 2015-08-07 – 2015-08-12 (×18): 36000 [IU] via ORAL
  Filled 2015-08-07 (×22): qty 1

## 2015-08-07 MED ORDER — CIPROFLOXACIN IN D5W 400 MG/200ML IV SOLN
400.0000 mg | Freq: Once | INTRAVENOUS | Status: AC
  Administered 2015-08-07: 400 mg via INTRAVENOUS
  Filled 2015-08-07: qty 200

## 2015-08-07 MED ORDER — SODIUM CHLORIDE 0.9 % IV SOLN: INTRAVENOUS | Status: DC

## 2015-08-07 MED ORDER — VENLAFAXINE HCL 75 MG PO TABS
37.5000 mg | ORAL_TABLET | Freq: Two times a day (BID) | ORAL | Status: DC
  Administered 2015-08-07 – 2015-08-12 (×11): 37.5 mg via ORAL
  Filled 2015-08-07 (×5): qty 0.5
  Filled 2015-08-07 (×4): qty 1
  Filled 2015-08-07 (×3): qty 0.5

## 2015-08-07 MED ORDER — ALIGN 4 MG PO CAPS
1.0000 | ORAL_CAPSULE | Freq: Every day | ORAL | Status: DC
  Administered 2015-08-07 – 2015-08-12 (×6): 1 via ORAL
  Filled 2015-08-07 (×11): qty 1

## 2015-08-07 MED ORDER — PROCHLORPERAZINE MALEATE 10 MG PO TABS
10.0000 mg | ORAL_TABLET | Freq: Four times a day (QID) | ORAL | Status: DC | PRN
  Administered 2015-08-10: 10 mg via ORAL
  Filled 2015-08-07: qty 1

## 2015-08-07 MED ORDER — HEPARIN SODIUM (PORCINE) 5000 UNIT/ML IJ SOLN
5000.0000 [IU] | Freq: Three times a day (TID) | INTRAMUSCULAR | Status: DC
  Administered 2015-08-07 – 2015-08-12 (×17): 5000 [IU] via SUBCUTANEOUS
  Filled 2015-08-07 (×16): qty 1

## 2015-08-07 MED ORDER — BISMUTH SUBSALICYLATE 262 MG/15ML PO SUSP: 30.0000 mL | Freq: Four times a day (QID) | ORAL | Status: DC | PRN

## 2015-08-07 MED ORDER — POTASSIUM CHLORIDE 20 MEQ/15ML (10%) PO SOLN
40.0000 meq | ORAL | Status: DC
  Administered 2015-08-07 (×2): 40 meq via ORAL
  Filled 2015-08-07 (×2): qty 30

## 2015-08-07 MED ORDER — FENTANYL CITRATE (PF) 100 MCG/2ML IJ SOLN
50.0000 ug | Freq: Once | INTRAMUSCULAR | Status: AC
  Administered 2015-08-07: 50 ug via INTRAVENOUS
  Filled 2015-08-07: qty 2

## 2015-08-07 MED ORDER — SODIUM CHLORIDE 0.9 % IV SOLN
1000.0000 mL | Freq: Once | INTRAVENOUS | Status: AC
  Administered 2015-08-07: 1000 mL via INTRAVENOUS

## 2015-08-07 MED ORDER — DILTIAZEM LOAD VIA INFUSION
5.0000 mg | Freq: Once | INTRAVENOUS | Status: AC
  Administered 2015-08-07: 5 mg via INTRAVENOUS
  Filled 2015-08-07: qty 5

## 2015-08-07 MED ORDER — DILTIAZEM HCL 25 MG/5ML IV SOLN
5.0000 mg | Freq: Once | INTRAVENOUS | Status: DC
  Filled 2015-08-07: qty 5

## 2015-08-07 MED ORDER — PROPRANOLOL HCL ER 80 MG PO CP24
80.0000 mg | ORAL_CAPSULE | Freq: Every day | ORAL | Status: DC
  Administered 2015-08-07 – 2015-08-11 (×6): 80 mg via ORAL
  Filled 2015-08-07 (×8): qty 1

## 2015-08-07 MED ORDER — DILTIAZEM HCL ER COATED BEADS 120 MG PO CP24
120.0000 mg | ORAL_CAPSULE | Freq: Every day | ORAL | Status: DC
  Administered 2015-08-07 – 2015-08-08 (×2): 120 mg via ORAL
  Filled 2015-08-07 (×2): qty 1

## 2015-08-07 MED ORDER — MAGNESIUM OXIDE 400 (241.3 MG) MG PO TABS
400.0000 mg | ORAL_TABLET | Freq: Two times a day (BID) | ORAL | Status: DC
  Administered 2015-08-07 – 2015-08-09 (×6): 400 mg via ORAL
  Filled 2015-08-07 (×6): qty 1

## 2015-08-07 MED ORDER — BENZONATATE 100 MG PO CAPS: 100.0000 mg | ORAL_CAPSULE | Freq: Three times a day (TID) | ORAL | Status: DC | PRN

## 2015-08-07 MED ORDER — LORATADINE 10 MG PO TABS
10.0000 mg | ORAL_TABLET | Freq: Every day | ORAL | Status: DC
  Administered 2015-08-07 – 2015-08-12 (×7): 10 mg via ORAL
  Filled 2015-08-07 (×6): qty 1

## 2015-08-07 MED ORDER — ONDANSETRON HCL 4 MG/2ML IJ SOLN
4.0000 mg | Freq: Four times a day (QID) | INTRAMUSCULAR | Status: DC | PRN
  Administered 2015-08-09: 4 mg via INTRAVENOUS
  Filled 2015-08-07: qty 2

## 2015-08-07 MED ORDER — SODIUM CHLORIDE 0.9 % IJ SOLN
3.0000 mL | Freq: Two times a day (BID) | INTRAMUSCULAR | Status: DC
  Administered 2015-08-09 – 2015-08-10 (×2): 3 mL via INTRAVENOUS

## 2015-08-07 MED ORDER — POTASSIUM CHLORIDE 10 MEQ/100ML IV SOLN
10.0000 meq | INTRAVENOUS | Status: AC
  Administered 2015-08-07 (×4): 10 meq via INTRAVENOUS
  Filled 2015-08-07 (×4): qty 100

## 2015-08-07 MED ORDER — DEXTROSE 5 % IV SOLN
5.0000 mg/h | Freq: Once | INTRAVENOUS | Status: AC
  Administered 2015-08-07: 5 mg/h via INTRAVENOUS
  Filled 2015-08-07: qty 100

## 2015-08-07 MED ORDER — POTASSIUM CHLORIDE CRYS ER 20 MEQ PO TBCR: 40.0000 meq | EXTENDED_RELEASE_TABLET | ORAL | Status: DC

## 2015-08-07 MED ORDER — DIPHENHYDRAMINE HCL 25 MG PO CAPS
25.0000 mg | ORAL_CAPSULE | Freq: Four times a day (QID) | ORAL | Status: DC | PRN
  Filled 2015-08-07: qty 1

## 2015-08-07 MED ORDER — HYDROMORPHONE HCL 1 MG/ML IJ SOLN
0.5000 mg | INTRAMUSCULAR | Status: DC | PRN
  Administered 2015-08-08: 0.5 mg via INTRAVENOUS
  Administered 2015-08-09 – 2015-08-10 (×4): 1 mg via INTRAVENOUS
  Filled 2015-08-07 (×5): qty 1

## 2015-08-07 MED ORDER — VITAMIN B-6 100 MG PO TABS
200.0000 mg | ORAL_TABLET | Freq: Every day | ORAL | Status: DC
  Administered 2015-08-07 – 2015-08-12 (×6): 200 mg via ORAL
  Filled 2015-08-07 (×6): qty 2

## 2015-08-07 MED ORDER — PANTOPRAZOLE SODIUM 40 MG PO TBEC
80.0000 mg | DELAYED_RELEASE_TABLET | Freq: Every day | ORAL | Status: DC
  Administered 2015-08-07 – 2015-08-12 (×6): 80 mg via ORAL
  Filled 2015-08-07 (×6): qty 2

## 2015-08-07 MED ORDER — SODIUM CHLORIDE 0.9 % IV SOLN
1000.0000 mL | INTRAVENOUS | Status: DC
  Administered 2015-08-07 – 2015-08-10 (×5): 1000 mL via INTRAVENOUS

## 2015-08-07 MED ORDER — POTASSIUM CHLORIDE CRYS ER 20 MEQ PO TBCR
40.0000 meq | EXTENDED_RELEASE_TABLET | Freq: Once | ORAL | Status: AC
  Administered 2015-08-07: 40 meq via ORAL
  Filled 2015-08-07: qty 2

## 2015-08-07 MED ORDER — METRONIDAZOLE IN NACL 5-0.79 MG/ML-% IV SOLN
500.0000 mg | Freq: Once | INTRAVENOUS | Status: AC
  Administered 2015-08-07: 500 mg via INTRAVENOUS
  Filled 2015-08-07: qty 100

## 2015-08-07 MED ORDER — CIPROFLOXACIN IN D5W 400 MG/200ML IV SOLN
400.0000 mg | Freq: Two times a day (BID) | INTRAVENOUS | Status: DC
  Administered 2015-08-07 – 2015-08-10 (×6): 400 mg via INTRAVENOUS
  Filled 2015-08-07 (×6): qty 200

## 2015-08-07 MED ORDER — POTASSIUM CHLORIDE CRYS ER 20 MEQ PO TBCR: 40.0000 meq | EXTENDED_RELEASE_TABLET | Freq: Two times a day (BID) | ORAL | Status: DC

## 2015-08-07 MED ORDER — METRONIDAZOLE IN NACL 5-0.79 MG/ML-% IV SOLN
500.0000 mg | Freq: Three times a day (TID) | INTRAVENOUS | Status: DC
  Administered 2015-08-07 – 2015-08-10 (×10): 500 mg via INTRAVENOUS
  Filled 2015-08-07 (×10): qty 100

## 2015-08-07 MED ORDER — ONDANSETRON HCL 4 MG/2ML IJ SOLN
4.0000 mg | Freq: Once | INTRAMUSCULAR | Status: AC
  Administered 2015-08-07: 4 mg via INTRAVENOUS
  Filled 2015-08-07: qty 2

## 2015-08-07 MED ORDER — DRONABINOL 2.5 MG PO CAPS
5.0000 mg | ORAL_CAPSULE | Freq: Two times a day (BID) | ORAL | Status: DC
  Administered 2015-08-07 – 2015-08-12 (×12): 5 mg via ORAL
  Filled 2015-08-07 (×3): qty 2
  Filled 2015-08-07: qty 1
  Filled 2015-08-07 (×3): qty 2
  Filled 2015-08-07: qty 1
  Filled 2015-08-07 (×4): qty 2
  Filled 2015-08-07: qty 1

## 2015-08-07 NOTE — Progress Notes (Signed)
[place]  [phone]   Patient Care Team: Chesley Noon, MD as PCP - General (Family Medicine)  HOSPITAL CONSULT  NOTE  HPI: Willie Owens is a 64 year old man with a history of recurrent adenocarcinoma of the esophagus, recently hospitalized from 8/6-8/8 for the management of nausea and diarrhea due to enteritis, and leukopenia, now readmitted with similar symptoms for 2 days, including diffuse abdominal pain requiring aggressive management with IV fluids and electrolyte replenishment. In addition, he presented with A fib RVR requiring Cardizem drip. Denies fevers, chills, night sweats, vision changes, or mucositis. Denies any respiratory complaints. Denies lower extremity swelling.  Denies any dysuria. Denies abnormal skin rashes, or neuropathy. Denies any bleeding issues such as epistaxis, hematemesis, hematuria or hematochezia.   CT abdomen and pelvis with contrast showed again diffuse enteritis of small bowel and colon. Of note, decrease in size of regional metastatic disease from esophageal carcinoma was noted. Chest x ray is negative for acute changes. We were informed of the patient's admission.  Principle Diagnosis:   Recurrent adenocarcinoma of the distal esophagus - HER2 negative  Current Therapy:   Status post cycle 5 of FOLFIRI   MEDICATIONS: Scheduled Meds: . ALIGN  1 capsule Oral Q breakfast  . ciprofloxacin  400 mg Intravenous Q12H  . diltiazem  120 mg Oral Daily  . dronabinol  5 mg Oral BID AC  . heparin  5,000 Units Subcutaneous 3 times per day  . lipase/protease/amylase  36,000 Units Oral TID AC  . loratadine  10 mg Oral Daily  . magnesium oxide  400 mg Oral BID  . metronidazole  500 mg Intravenous Q8H  . pantoprazole  80 mg Oral Daily  . potassium chloride  40 mEq Oral 6 times per day  . propranolol ER  80 mg Oral QHS  . pyridOXINE  200 mg Oral Daily  . sodium chloride  3 mL Intravenous Q12H  . venlafaxine  37.5 mg Oral  BID   Continuous Infusions: . sodium chloride 1,000 mL (08/07/15 0218)   PRN Meds:.benzonatate, bismuth subsalicylate, diphenhydrAMINE, HYDROmorphone (DILAUDID) injection, ondansetron (ZOFRAN) IV, prochlorperazine, traZODone ALLERGIES:   Allergies  Allergen Reactions  . Penicillins Other (See Comments)    Hallucinations, from childhood     PHYSICAL EXAMINATION:  Filed Vitals:   08/07/15 0833  BP: 94/58  Pulse:   Temp:   Resp:    Filed Weights   08/07/15 0700  Weight: 186 lb 4.6 oz (84.5 kg)    GENERAL:alert, no distress and comfortable SKIN: skin color, texture, turgor are normal, no rashes or significant lesions EYES: normal, conjunctiva are pink and non-injected, sclera clear OROPHARYNX:no exudate, no erythema and lips, buccal mucosa, and tongue normal  NECK: supple, thyroid normal size, non-tender, without nodularity LYMPH:  no palpable lymphadenopathy in the cervical, axillary or inguinal LUNGS: clear to auscultation and percussion with normal breathing effort HEART: regular rate & rhythm and no murmurs and no lower extremity edema ABDOMEN: soft, non-tender and normal bowel sounds Musculoskeletal:no cyanosis of digits and no clubbing  PSYCH: alert & oriented x 3 with fluent speech NEURO: no focal motor/sensory deficits   LABORATORY/RADIOLOGY DATA:   Recent Labs Lab 08/02/15 1248 08/02/15 1800 08/03/15 0413 08/05/15 1354 08/07/15 0025  WBC 1.2* 1.3* 1.3* 1.3* 9.3  HGB 11.6* 9.6* 9.3* 11.8* 12.8*  HCT 32.1* 28.1* 26.9* 33.7* 35.5*  PLT 127* 101* 100* 215 327  MCV 96.4 97.2 98.2 98.3 97.8  MCH 34.8* 33.2 33.9 34.4*  35.3*  MCHC 36.1* 34.2 34.6 35.0 36.1*  RDW 15.7* 15.7* 15.9* 16.1* 16.3*  LYMPHSABS 0.5*  --   --   --  2.1  MONOABS 0.1  --   --   --  2.0*  EOSABS 0.0  --   --   --  0.0  BASOSABS 0.0  --   --   --  0.0    CMP    Recent Labs Lab 08/02/15 1248 08/02/15 1800 08/03/15 0413 08/03/15 1430 08/04/15 0403 08/05/15 1354 08/07/15 0025    NA 128*  --  130* 132* 135 133* 134*  K 3.0*  --  3.0* 2.9* 3.1* 3.2* 2.8*  CL 94*  --  103 104 108 104 106  CO2 23  --  24 23 21* 17* 13*  GLUCOSE 156*  --  105* 113* 125* 161* 146*  BUN 21*  --  _0 22*  CREATININE 1.23 1.09 1.10 0.98 1.06 1.15 1.58*  CALCIUM 8.7*  --  7.9* 7.8* 8.5* 9.4 10.3  MG  --  1.3*  --   --  2.1 1.7 2.0  AST 16  --  10*  --   --  20 19  ALT 12*  --  9*  --   --  14* 18  ALKPHOS 48  --  36*  --   --  42 52  BILITOT 1.9*  --  1.2  --   --  1.1 0.8        Component Value Date/Time   BILITOT 0.8 08/07/2015 0025   BILITOT 0.80 07/28/2015 0806   BILIDIR 0.3 08/03/2015 0413   IBILI 0.9 08/03/2015 0413    Anemia panel:   No results for input(s): VITAMINB12, FOLATE, FERRITIN, TIBC, IRON, RETICCTPCT in the last 72 hours.   Recent Labs  08/07/15 0025  TSH 2.854        Component Value Date/Time   ESRSEDRATE 25* 04/24/2013 0201    No results for input(s): INR, PROTIME in the last 168 hours.    Urinalysis    Component Value Date/Time   COLORURINE ORANGE* 08/05/2015 1436   APPEARANCEUR CLOUDY* 08/05/2015 1436   LABSPEC 1.033* 08/05/2015 1436   PHURINE 5.5 08/05/2015 1436   GLUCOSEU NEGATIVE 08/05/2015 1436   HGBUR NEGATIVE 08/05/2015 1436   BILIRUBINUR SMALL* 08/05/2015 1436   KETONESUR NEGATIVE 08/05/2015 1436   PROTEINUR 30* 08/05/2015 1436   UROBILINOGEN 0.2 08/05/2015 1436   NITRITE POSITIVE* 08/05/2015 1436   LEUKOCYTESUR TRACE* 08/05/2015 1436    Drugs of Abuse     Component Value Date/Time   LABOPIA NONE DETECTED 09/04/2014 1733   COCAINSCRNUR NONE DETECTED 09/04/2014 1733   LABBENZ NONE DETECTED 09/04/2014 1733   AMPHETMU NONE DETECTED 09/04/2014 1733   THCU POSITIVE* 09/04/2014 1733   LABBARB NONE DETECTED 09/04/2014 1733     Liver Function Tests:  Recent Labs Lab 08/02/15 1248 08/03/15 0413 08/03/15 1430 08/04/15 0403 08/05/15 1354 08/07/15 0025  AST 16 10*  --   --  20 19  ALT 12* 9*  --   --  14* 18   ALKPHOS 48 36*  --   --  42 52  BILITOT 1.9* 1.2  --   --  1.1 0.8  PROT 6.5 5.2*  --   --  6.9 7.4  ALBUMIN 3.2* 2.7* 2.5* 2.9* 3.4* 3.7    Recent Labs Lab 08/02/15 1730 08/05/15 1354  LIPASE 21* 49   Thyroid function studies  Recent Labs  08/07/15 0025  TSH 2.854    Radiology Studies:  Ct Abdomen Pelvis W Contrast  08/05/2015   CLINICAL DATA:  Abdominal pain. Esophageal cancer. Chronic pancreatitis. Nausea, vomiting, diarrhea.  EXAM: CT ABDOMEN AND PELVIS WITH CONTRAST  TECHNIQUE: Multidetector CT imaging of the abdomen and pelvis was performed using the standard protocol following bolus administration of intravenous contrast.  CONTRAST:  180m OMNIPAQUE IOHEXOL 300 MG/ML  SOLN  COMPARISON:  PET-CT 05/19/2015.  Radiographs 08/05/2015.  FINDINGS: Musculoskeletal:  No aggressive osseous lesions.  Lung Bases: Mild respiratory motion artifact is present. Coronary artery atherosclerosis is present. If office based assessment of coronary risk factors has not been performed, it is now recommended.  In the visible lung bases, there is no evidence of previously seen pulmonary metastatic disease.  Liver: Old granulomatous disease with calcification in the LEFT hepatic dome.  Spleen:  Normal.  Gallbladder:  Normal.  Common bile duct:  Normal.  Pancreas:  Normal.  Adrenal glands:  Normal bilaterally.  Kidneys: Subcentimeter cyst in the LEFT interpolar kidney. Ureters appear normal.  Stomach: Patulous distal thoracic esophagus with hiatal hernia. No inflammatory changes of stomach. Mild distal esophageal thickening is present.  The upper abdominal lymph nodes seen on prior PET-CT have decreased in size with chemotherapy. Favorable response to treatment is present on upper abdominal adenopathy.  Small bowel: Enteritis is present with mucosal hyperenhancement and fluid filling small bowel. No obstruction.  Colon: Colitis is present with fluid filling the colon diffusely. Mild mucosal hyperenhancement is  present. No obstruction. Calcification is present near the appendiceal orifice, probably ingested material.  Pelvic Genitourinary:  Urinary bladder appears normal.  Peritoneum: No free fluid.  No free air.  Vascular/lymphatic: Atherosclerosis.  No acute vascular abnormality.  Body Wall: Fat containing RIGHT inguinal hernia.  IMPRESSION: 1. Diffuse enteritis of small bowel and colon. 2. Decrease in size of regional metastatic disease from esophageal carcinoma.   Electronically Signed   By: GDereck LigasM.D.   On: 08/05/2015 17:40   Dg Chest Port 1 View  08/02/2015   CLINICAL DATA:  Esophageal adenocarcinoma status post chemotherapy. Chronic pancreatitis with nausea, vomiting and diarrhea.  EXAM: PORTABLE CHEST - 1 VIEW  COMPARISON:  PET-CT 05/19/2015.  Chest radiographs 11/28/2014.  FINDINGS: 1546 hours. Right IJ Port-A-Cath tip is unchanged at the level of the superior right atrium. The heart size and mediastinal contours are stable. The lungs appear stable with interstitial prominence. No progressive nodularity identified. There is no confluent airspace opacity or pleural effusion. The bones appear unchanged.  IMPRESSION: Radiographically stable appearance of the chest without progressive nodularity. Port-A-Cath unchanged at the upper right atrial level.   Electronically Signed   By: WRichardean SaleM.D.   On: 08/02/2015 16:05   Dg Abd Acute W/chest  08/07/2015   CLINICAL DATA:  Acute onset of generalized abdominal pain and burning. Abdominal swelling. Nausea. Initial encounter.  EXAM: DG ABDOMEN ACUTE W/ 1V CHEST  COMPARISON:  CT of the abdomen and pelvis, and chest and abdominal radiographs performed 08/05/2015  FINDINGS: The lungs are well-aerated. Vascular congestion is noted. There is no evidence of focal opacification, pleural effusion or pneumothorax. The cardiomediastinal silhouette is within normal limits. A right-sided chest port is noted ending about the cavoatrial junction.  The visualized bowel  gas pattern is unremarkable. Scattered air and trace fluid are seen within the colon; there is no evidence of small bowel dilatation to suggest obstruction. No free intra-abdominal air is identified on the provided decubitus view.  No acute osseous abnormalities are seen; the sacroiliac joints are unremarkable in appearance.  IMPRESSION: 1. Vascular congestion noted; lungs remain grossly clear. 2. Unremarkable bowel gas pattern; no free intra-abdominal air seen.   Electronically Signed   By: Garald Balding M.D.   On: 08/07/2015 01:44   Dg Abd Acute W/chest  08/05/2015   CLINICAL DATA:  Increasing weakness and nausea, esophageal cancer post chemotherapy 1 day ago, atrial fibrillation, former smoker  EXAM: DG ABDOMEN ACUTE W/ 1V CHEST  COMPARISON:  Chest radiograph 08/02/2015  FINDINGS: RIGHT jugular Port-A-Cath with tip projecting over SVC just above cavoatrial junction.  Normal heart size, mediastinal contours, and pulmonary vascularity.  Lungs emphysematous but clear.  No infiltrate, pleural effusion or pneumothorax.  Mild gaseous distention of the transverse colon.  Paucity of small bowel gas.  No definite bowel wall thickening or free intraperitoneal air.  Osseous structures demineralized.  No urine tract calcification.  IMPRESSION: COPD changes without acute infiltrate.  Gaseous distention of transverse colon, nonspecific, without evidence of bowel obstruction or perforation.   Electronically Signed   By: Lavonia Dana M.D.   On: 08/05/2015 15:07       ASSESSMENT AND PLAN:  Acute Enteritis  He presented with nausea, vomiting and diarrhea, as well as diffuse abdominal pain  CT abdomen and pelvis with contrast showed again diffuse enteritis of small bowel and colon.   Chest x ray is negative for acute changes. GI pathogen is pending He was placed on empiric cipro and Flagyl as well as antiemetics, electrolyte replenishment and IVFs  Appreciate care provided by primary team  Recurrent adenocarcinoma  of the esophagus S/p C5 FOLFIRI on 8/1 with Neulasta on 8/3 CT of the abdomen on 8/11 shows decrease in size of regional metastatic disease from esophageal carcinoma Will continue treatment as outpatient when clinically stable  Afib with RVR He is on Cardizem as per primary team  DVT prophylaxis On heparin sq tid  Full Code   Other medical issues as per admitting team   [facility] E, PA-C 08/07/2015, 11:07 AM  ADDENDUM:  I saw and examined Willie Owens.  He has terrible thrush this AM.  I will put him on some Diflucan to try to help with this.  I had to believe that the atrial fibrillation is probably from stress secondary to the colitis and possibly due to the chemotherapy.  It seems as if the chemotherapy is working as on his CT scan that was done on admission, there was decreased lymphadenopathy.  As is usual for him, whenever his body is "stressed", his memory seems to be more lacking. This will typically gets better.  If he needs to be on anticoagulation, I would not have a problem with this.  It is hard to know how much she realizes what is going on right now. I will have to get his wife a call.  I appreciate the outstanding care that he is getting from the staff in the ICU.  He did have some neutropenia. This has resolved. His white cell count today is 13,000. Hemoglobin 10.3. Platelet count 230,000.  His potassium is low at 2.8. He does have some mild renal insufficiency which is new. Hopefully, this is just from some dehydration.  We will continue to follow him along. I will be in contact with his wife to update her with his status.  Lum Keas  Psalm 103:11

## 2015-08-07 NOTE — Progress Notes (Signed)
ANTIBIOTIC CONSULT NOTE - INITIAL  Pharmacy Consult for Cipro Indication: Intra-abdominal infection  Allergies  Allergen Reactions  . Penicillins Other (See Comments)    Hallucinations, from childhood    Patient Measurements:   Wt=86 kg  Vital Signs: Temp: 96.9 F (36.1 C) (08/11 0054) Temp Source: Axillary (08/11 0054) BP: 104/57 mmHg (08/11 0530) Pulse Rate: 82 (08/11 0530) Intake/Output from previous day:   Intake/Output from this shift:    Labs:  Recent Labs  08/05/15 1354 08/07/15 0025  WBC 1.3* 9.3  HGB 11.8* 12.8*  PLT 215 327  CREATININE 1.15 1.58*   Estimated Creatinine Clearance: 59.4 mL/min (by C-G formula based on Cr of 1.58). No results for input(s): VANCOTROUGH, VANCOPEAK, VANCORANDOM, GENTTROUGH, GENTPEAK, GENTRANDOM, TOBRATROUGH, TOBRAPEAK, TOBRARND, AMIKACINPEAK, AMIKACINTROU, AMIKACIN in the last 72 hours.   Microbiology: No results found for this or any previous visit (from the past 720 hour(s)).  Medical History: Past Medical History  Diagnosis Date  . Atrial fibrillation     with RVR while hospitalized in 2014  . Hyperthyroidism     thyrotoxicosis , graves  . H/O [facility] spotted fever   . Gout   . History of antinuclear antibodies   . GERD (gastroesophageal reflux disease)   . Cancer 06/2014    esopageal ca-7/15  . Esophageal cancer 07/26/2014  . Maintenance chemotherapy     every 3 weeks/last chemo  was on 11/14/14  . Portacath in place     right side upper chest    Medications:   (Not in a hospital admission) Scheduled:  . ALIGN  1 capsule Oral Q breakfast  . dronabinol  5 mg Oral BID AC  . heparin  5,000 Units Subcutaneous 3 times per day  . lipase/protease/amylase  36,000 Units Oral TID AC  . loratadine  10 mg Oral Daily  . magnesium oxide  400 mg Oral BID  . pantoprazole  80 mg Oral Daily  . potassium chloride SA  40 mEq Oral BID  . propranolol ER  80 mg Oral QHS  . pyridOXINE  200 mg Oral Daily  . sodium  chloride  3 mL Intravenous Q12H  . venlafaxine  37.5 mg Oral BID   Infusions:  . sodium chloride 1,000 mL (08/07/15 0218)  . ciprofloxacin 400 mg (08/07/15 0515)  . metronidazole    . potassium chloride 10 mEq (08/07/15 0517)   Assessment: 55 yoM with hx of esophageal adenocarcinoma on chemo.  Cipro per Rx for worsening generalized abdominal pain.   Goal of Therapy:  Treat infection  Plan:   Cipro 400mg  IV q12h  F/u SCr/cultures as needed  Lawana Pai R 08/07/2015,5:50 AM

## 2015-08-07 NOTE — Progress Notes (Signed)
[facility] PROGRESS NOTE  Willie Owens EGB:[id number] DOB: 1951/04/07 DOA: 08/06/2015 PCP: Chesley Noon, MD  Assessment/Plan: 1. Nausea/vomiting/diarrhea -Patient recently hospitalized for nausea vomiting and diarrhea which improved, however, symptoms getting worse in the last 24 hours. -Stool studies are pending at the time of dictation. He has been placed on enteric cautions -Will provide IV fluid resuscitation and replace electrolytes.  2.  Hypokalemia. -Initial lab work showing potassium of 2.8, down from 3.2 on 08/05/2015. Like he secondary to GI losses from multiple episodes of nausea and vomiting. Will replace with by mouth and IV potassium chloride today, follow-up on a.m. Labs.  3.  Atrial fibrillation with rapid ventricular response. -Initially he presented with A. fib RVR having ventricular rates in the 130s, started on Cardizem drip. By the time he arrived to the step down unit heart rates improving -We will transition him to oral Cardizem 120 mg by mouth daily -A. fib likely secondary to electrolyte abnormalities and dehydration from GI losses  4.  Probable demand ischemia. -Troponins mildly elevated at 0.15 and 0.19. Aspect secondary to demand ischemia from A. fib with RVR. -Will monitor closely.  5.  Esophageal cancer -Patient currently receiving treatment at the cancer center and follows Dr. Marin Olp of [facility]. -He was recently given Neulasta for leukopenia. Today's labs show an improvement in white count from 1.3 on 08/05/2015 to 9.3   Code Status: Full code Family Communication: Family not present Disposition Plan: Continue monitoring in the step down unit    HPI/Subjective: Patient is a pleasant 64 year old gentleman with a past medical history of adenocarcinoma of the esophagus undergoing chemotherapy who was recently discharged from the medicine service on 08/04/2015 at which time he was treated for nausea vomiting and diarrhea felt to  be secondary to enteritis. During that hospitalization his stool for C. difficile was negative. He reported feeling well on the day of discharge however came back overnight with complaints of worsening nausea vomiting and diarrhea. He was found to be in atrial fibrillation with rapid ventricular response and initially started on a Cardizem drip. Upon arrival to stepdown units ventricular rates improved. Currently heart rates in the 70s. Have ordered discontinuation of IV Cardizem and transition to oral Cardizem.  Objective: Filed Vitals:   08/07/15 0619  BP: 86/52  Pulse: 69  Temp:   Resp: 18    Intake/Output Summary (Last 24 hours) at 08/07/15 0724 Last data filed at 08/07/15 0700  Gross per 24 hour  Intake  787.5 ml  Output      0 ml  Net  787.5 ml   Filed Weights   08/07/15 0700  Weight: 84.5 kg (186 lb 4.6 oz)    Exam:   General:  No acute distress, he states feeling little better this morning. Currently denies chest pain  Cardiovascular: Irregular rate and rhythm normal S1-S2 no murmurs rubs gallops  Respiratory: Normal respiratory effort, lungs are clear to auscultation bilaterally  Abdomen: Having mild tenderness to palpation over epigastric region  Musculoskeletal: No edema  Data Reviewed: Basic Metabolic Panel:  Recent Labs Lab 08/02/15 1800 08/03/15 0413 08/03/15 1430 08/04/15 0403 08/05/15 1354 08/07/15 0025  NA  --  130* 132* 135 133* 134*  K  --  3.0* 2.9* 3.1* 3.2* 2.8*  CL  --  103 104 108 104 106  CO2  --  24 23 21* 17* 13*  GLUCOSE  --  105* 113* 125* 161* 146*  BUN  --  18 13 11 14  22*  CREATININE 1.09 1.10 0.98 1.06 1.15 1.58*  CALCIUM  --  7.9* 7.8* 8.5* 9.4 10.3  MG 1.3*  --   --  2.1 1.7 2.0  PHOS  --   --  1.7* 2.1*  --   --    Liver Function Tests:  Recent Labs Lab 08/02/15 1248 08/03/15 0413 08/03/15 1430 08/04/15 0403 08/05/15 1354 08/07/15 0025  AST 16 10*  --   --  20 19  ALT 12* 9*  --   --  14* 18  ALKPHOS 48 36*  --    --  42 52  BILITOT 1.9* 1.2  --   --  1.1 0.8  PROT 6.5 5.2*  --   --  6.9 7.4  ALBUMIN 3.2* 2.7* 2.5* 2.9* 3.4* 3.7    Recent Labs Lab 08/02/15 1730 08/05/15 1354  LIPASE 21* 49   No results for input(s): AMMONIA in the last 168 hours. CBC:  Recent Labs Lab 08/02/15 1248 08/02/15 1800 08/03/15 0413 08/05/15 1354 08/07/15 0025  WBC 1.2* 1.3* 1.3* 1.3* 9.3  NEUTROABS 0.6*  --   --   --  5.2  HGB 11.6* 9.6* 9.3* 11.8* 12.8*  HCT 32.1* 28.1* 26.9* 33.7* 35.5*  MCV 96.4 97.2 98.2 98.3 97.8  PLT 127* 101* 100* 215 327   Cardiac Enzymes: No results for input(s): CKTOTAL, CKMB, CKMBINDEX, TROPONINI in the last 168 hours. BNP (last 3 results) No results for input(s): BNP in the last 8760 hours.  ProBNP (last 3 results) No results for input(s): PROBNP in the last 8760 hours.  CBG: No results for input(s): GLUCAP in the last 168 hours.  No results found for this or any previous visit (from the past 240 hour(s)).   Studies: Ct Abdomen Pelvis W Contrast  08/05/2015   CLINICAL DATA:  Abdominal pain. Esophageal cancer. Chronic pancreatitis. Nausea, vomiting, diarrhea.  EXAM: CT ABDOMEN AND PELVIS WITH CONTRAST  TECHNIQUE: Multidetector CT imaging of the abdomen and pelvis was performed using the standard protocol following bolus administration of intravenous contrast.  CONTRAST:  119mL OMNIPAQUE IOHEXOL 300 MG/ML  SOLN  COMPARISON:  PET-CT 05/19/2015.  Radiographs 08/05/2015.  FINDINGS: Musculoskeletal:  No aggressive osseous lesions.  Lung Bases: Mild respiratory motion artifact is present. Coronary artery atherosclerosis is present. If office based assessment of coronary risk factors has not been performed, it is now recommended.  In the visible lung bases, there is no evidence of previously seen pulmonary metastatic disease.  Liver: Old granulomatous disease with calcification in the LEFT hepatic dome.  Spleen:  Normal.  Gallbladder:  Normal.  Common bile duct:  Normal.  Pancreas:   Normal.  Adrenal glands:  Normal bilaterally.  Kidneys: Subcentimeter cyst in the LEFT interpolar kidney. Ureters appear normal.  Stomach: Patulous distal thoracic esophagus with hiatal hernia. No inflammatory changes of stomach. Mild distal esophageal thickening is present.  The upper abdominal lymph nodes seen on prior PET-CT have decreased in size with chemotherapy. Favorable response to treatment is present on upper abdominal adenopathy.  Small bowel: Enteritis is present with mucosal hyperenhancement and fluid filling small bowel. No obstruction.  Colon: Colitis is present with fluid filling the colon diffusely. Mild mucosal hyperenhancement is present. No obstruction. Calcification is present near the appendiceal orifice, probably ingested material.  Pelvic Genitourinary:  Urinary bladder appears normal.  Peritoneum: No free fluid.  No free air.  Vascular/lymphatic: Atherosclerosis.  No acute vascular abnormality.  Body Wall: Fat containing RIGHT inguinal hernia.  IMPRESSION: 1. Diffuse enteritis  of small bowel and colon. 2. Decrease in size of regional metastatic disease from esophageal carcinoma.   Electronically Signed   By: Dereck Ligas M.D.   On: 08/05/2015 17:40   Dg Abd Acute W/chest  08/07/2015   CLINICAL DATA:  Acute onset of generalized abdominal pain and burning. Abdominal swelling. Nausea. Initial encounter.  EXAM: DG ABDOMEN ACUTE W/ 1V CHEST  COMPARISON:  CT of the abdomen and pelvis, and chest and abdominal radiographs performed 08/05/2015  FINDINGS: The lungs are well-aerated. Vascular congestion is noted. There is no evidence of focal opacification, pleural effusion or pneumothorax. The cardiomediastinal silhouette is within normal limits. A right-sided chest port is noted ending about the cavoatrial junction.  The visualized bowel gas pattern is unremarkable. Scattered air and trace fluid are seen within the colon; there is no evidence of small bowel dilatation to suggest obstruction.  No free intra-abdominal air is identified on the provided decubitus view.  No acute osseous abnormalities are seen; the sacroiliac joints are unremarkable in appearance.  IMPRESSION: 1. Vascular congestion noted; lungs remain grossly clear. 2. Unremarkable bowel gas pattern; no free intra-abdominal air seen.   Electronically Signed   By: Garald Balding M.D.   On: 08/07/2015 01:44   Dg Abd Acute W/chest  08/05/2015   CLINICAL DATA:  Increasing weakness and nausea, esophageal cancer post chemotherapy 1 day ago, atrial fibrillation, former smoker  EXAM: DG ABDOMEN ACUTE W/ 1V CHEST  COMPARISON:  Chest radiograph 08/02/2015  FINDINGS: RIGHT jugular Port-A-Cath with tip projecting over SVC just above cavoatrial junction.  Normal heart size, mediastinal contours, and pulmonary vascularity.  Lungs emphysematous but clear.  No infiltrate, pleural effusion or pneumothorax.  Mild gaseous distention of the transverse colon.  Paucity of small bowel gas.  No definite bowel wall thickening or free intraperitoneal air.  Osseous structures demineralized.  No urine tract calcification.  IMPRESSION: COPD changes without acute infiltrate.  Gaseous distention of transverse colon, nonspecific, without evidence of bowel obstruction or perforation.   Electronically Signed   By: Lavonia Dana M.D.   On: 08/05/2015 15:07    Scheduled Meds: . ALIGN  1 capsule Oral Q breakfast  . ciprofloxacin  400 mg Intravenous Q12H  . diltiazem  120 mg Oral Daily  . dronabinol  5 mg Oral BID AC  . heparin  5,000 Units Subcutaneous 3 times per day  . lipase/protease/amylase  36,000 Units Oral TID AC  . loratadine  10 mg Oral Daily  . magnesium oxide  400 mg Oral BID  . metronidazole  500 mg Intravenous Q8H  . pantoprazole  80 mg Oral Daily  . potassium chloride  10 mEq Intravenous Q1 Hr x 4  . potassium chloride SA  40 mEq Oral BID  . propranolol ER  80 mg Oral QHS  . pyridOXINE  200 mg Oral Daily  . sodium chloride  3 mL Intravenous Q12H   . venlafaxine  37.5 mg Oral BID   Continuous Infusions: . sodium chloride 1,000 mL (08/07/15 0218)    Active Problems:   Atrial fibrillation with RVR   Esophageal cancer   Diarrhea   Hyponatremia   Hypokalemia   Intractable nausea and vomiting   Enteritis    Time spent:     Willie Owens  [facility] Pager [phone]. If 7PM-7AM, please contact night-coverage at [email or web address], password [facility] 08/07/2015, 7:24 AM  LOS: 0 days

## 2015-08-07 NOTE — Care Management Note (Signed)
Case Management Note  Patient Details  Name: SUHAAS AGENA MRN: [id number] Date of Birth: January 22, 1951  Subjective/Objective:         A.fib with rvr nausea and vomiting in chemo patient           Action/Plan:Date:  August 07, 2015 U.R. performed for needs and level of care. Will continue to follow for Case Management needs.  Velva Harman, RN, BSN, [place]   [phone]   Expected Discharge Date:   (unknown)               Expected Discharge Plan:  Home/Self Care  In-House Referral:  NA  Discharge planning Services  CM Consult  Post Acute Care Choice:  NA Choice offered to:  NA  DME Arranged:  N/A DME Agency:  NA  HH Arranged:  NA HH Agency:  NA  Status of Service:  In process, will continue to follow  Medicare Important Message Given:    Date Medicare IM Given:    Medicare IM give by:    Date Additional Medicare IM Given:    Additional Medicare Important Message give by:     If discussed at [place] of Stay Meetings, dates discussed:    Additional Comments:  Leeroy Cha, RN 08/07/2015, 9:48 AM

## 2015-08-07 NOTE — H&P (Addendum)
[facility] History and Physical  Willie Owens AVW:[id number] DOB: 1951/08/21 DOA: 08/06/2015  Referring physician: EDP PCP: Chesley Noon, MD   Chief Complaint: N/V/D   HPI: Willie Owens is a 64 y.o. male with h/o esophageal adenocarcinoma on chemotherapy.  He was just on our service from 8/6 to 8/8 for N/V/D due to enteritis as well as leukopenia for which they put him on neulasta.  Was discharged home, but has began to feel worse with worsening N/V/D over the past couple of days since going home with development of worsening generalized abdominal pain today.  He presents to the ED.    Review of Systems: Systems reviewed.  As above, otherwise negative  Past Medical History  Diagnosis Date  . Atrial fibrillation     with RVR while hospitalized in 2014  . Hyperthyroidism     thyrotoxicosis , graves  . H/O [facility] spotted fever   . Gout   . History of antinuclear antibodies   . GERD (gastroesophageal reflux disease)   . Cancer 06/2014    esopageal ca-7/15  . Esophageal cancer 07/26/2014  . Maintenance chemotherapy     every 3 weeks/last chemo  was on 11/14/14  . Portacath in place     right side upper chest   Past Surgical History  Procedure Laterality Date  . Leg surgery      Fx, rod removed/right leg  . Transthoracic echocardiogram  4/292014    EF 55-60%; severe bi-atrial enlargement; RV mod dilated  . Cardioversion N/A 09/21/2013    Procedure: CARDIOVERSION;  Surgeon: Pixie Casino, MD;  Location: [facility] ENDOSCOPY;  Service: Cardiovascular;  Laterality: N/A;  . Tonsillectomy and adenoidectomy    . Porta cath      right side chest   Social History:  reports that he quit smoking about 10 years ago. His smoking use included Cigarettes. He started smoking about 44 years ago. He has a 68 pack-year smoking history. He has never used smokeless tobacco. He reports that he does not drink alcohol or use illicit drugs.  Allergies  Allergen Reactions  .  Penicillins Other (See Comments)    Hallucinations, from childhood    Family History  Problem Relation Age of Onset  . Pulmonary embolism Neg Hx   . CAD Neg Hx   . Colon cancer Neg Hx   . Stomach cancer Neg Hx   . Esophageal cancer Neg Hx   . Diabetes Mother      Prior to Admission medications   Medication Sig Start Date End Date Taking? Authorizing Provider  B Complex-C (SUPER B COMPLEX PO) Take 1 tablet by mouth daily.   Yes Historical Provider, MD  benzonatate (TESSALON PERLES) 100 MG capsule Take 1 capsule (100 mg total) by mouth 3 (three) times daily as needed for cough. 12/06/14  Yes Eliezer Bottom, NP  bismuth subsalicylate (PEPTO BISMOL) 262 MG/15ML suspension Take 30 mLs by mouth every 6 (six) hours as needed for diarrhea or loose stools.   Yes Historical Provider, MD  cetirizine (ZYRTEC) 10 MG tablet Take 10 mg by mouth daily as needed for allergies.    Yes Historical Provider, MD  Cholecalciferol (VITAMIN D) 2000 UNITS tablet Take 2,000 Units by mouth daily.   Yes Historical Provider, MD  diphenhydrAMINE (BENADRYL) 25 MG tablet Take 25 mg by mouth every 6 (six) hours as needed for allergies.   Yes Historical Provider, MD  diphenoxylate-atropine (LOMOTIL) 2.5-0.025 MG per tablet TAKE 1 TABLET BY MOUTH  FOUR TIMES DAILY AS NEEDED FOR DIARRHEA 07/22/15  Yes [name], MD  dronabinol (MARINOL) 5 MG capsule Take 1 capsule (5 mg total) by mouth 2 (two) times daily before a meal. 03/20/15  Yes [name], MD  HYDROmorphone (DILAUDID) 4 MG tablet Take 1 tablet (4 mg total) by mouth every 4 (four) hours as needed for severe pain. 08/05/15  Yes Clayton Bibles, PA-C  lidocaine-prilocaine (EMLA) cream Apply 1 application topically as needed. Patient taking differently: Apply 1 application topically as needed (numbing port every 2 weeks).  07/31/14  Yes [name], MD  lipase/protease/amylase (CREON) 36000 UNITS CPEP capsule Take 1 capsule (36,000 Units total) by mouth 3 (three)  times daily before meals. 07/30/15  Yes [name], MD  loperamide (IMODIUM A-D) 2 MG tablet Take 2 at onset of diarrhea, then 1 every 2hrs until 12hr without a BM. May take 2 tab every 4hrs at bedtime. If diarrhea recurs repeat. 06/02/15  Yes [name], MD  magnesium oxide (MAG-OX) 400 (241.3 MG) MG tablet Take 1 tablet (400 mg total) by mouth 2 (two) times daily. 06/16/15  Yes [name], MD  Multiple Vitamin (MULTIVITAMIN WITH MINERALS) TABS tablet Take 1 tablet by mouth daily.   Yes Historical Provider, MD  omeprazole (PRILOSEC) 40 MG capsule Take 1 capsule (40 mg total) by mouth daily. 04/23/15  Yes [name], MD  ondansetron (ZOFRAN) 8 MG tablet Take 1 tablet (8 mg total) by mouth 2 (two) times daily. Start the day after chemo for 3 days. Then take as needed for nausea or vomiting. 06/02/15  Yes [name], MD  pegfilgrastim (NEULASTA) 6 MG/0.6ML injection Inject 6 mg into the skin once.   Yes Historical Provider, MD  potassium chloride SA (K-DUR,KLOR-CON) 20 MEQ tablet Take 2 tablets (40 mEq total) by mouth 2 (two) times daily. 08/04/15  Yes Charlynne Cousins, MD  [place]   Yes Historical Provider, MD  Probiotic Product (ALIGN) 4 MG CAPS Take 1 capsule by mouth daily with breakfast.    Yes Historical Provider, MD  prochlorperazine (COMPAZINE) 10 MG tablet Take 10 mg by mouth every 6 (six) hours as needed for nausea or vomiting.    Yes Historical Provider, MD  propranolol ER (INDERAL LA) 60 MG 24 hr capsule Take 1 capsule (60 mg total) by mouth at bedtime. 01/24/15  Yes [name], MD  pyridOXINE (VITAMIN B-6) 100 MG tablet Take 2 pills at one time once a day. Patient taking differently: Take 200 mg by mouth daily.  03/20/15  Yes [name], MD  traZODone (DESYREL) 50 MG tablet Take 1 tablet (50 mg total) by mouth at bedtime. Patient taking differently: Take 50 mg by mouth at bedtime as needed for sleep.  10/24/14  Yes Eliezer Bottom,  NP  venlafaxine (EFFEXOR) 37.5 MG tablet TAKE 1 TABLET BY MOUTH TWICE DAILY Patient taking differently: TAKE 37.5 MG BY MOUTH TWICE DAILY 02/19/15  Yes [name], MD  ondansetron (ZOFRAN ODT) 4 MG disintegrating tablet Take 1 tablet (4 mg total) by mouth every 8 (eight) hours as needed for nausea or vomiting. 08/04/15   Charlynne Cousins, MD   Physical Exam: Filed Vitals:   08/07/15 0500  BP: 99/65  Pulse: 106  Temp:   Resp: 24    BP 99/65 mmHg  Pulse 106  Temp(Src) 96.9 F (36.1 C) (Axillary)  Resp 24  SpO2 98%  General  Appearance:    Alert, oriented, no distress, appears stated age  Head:    Normocephalic, atraumatic  Eyes:    PERRL, EOMI, sclera non-icteric        Nose:   Nares without drainage or epistaxis. Mucosa, turbinates normal  Throat:   Moist mucous membranes. Oropharynx without erythema or exudate.  Neck:   Supple. No carotid bruits.  No thyromegaly.  No lymphadenopathy.   Back:     No CVA tenderness, no spinal tenderness  Lungs:     Clear to auscultation bilaterally, without wheezes, rhonchi or rales  Chest wall:    No tenderness to palpitation  Heart:    Regular rate and rhythm without murmurs, gallops, rubs  Abdomen:     Soft, non-tender, nondistended, normal bowel sounds, no organomegaly  Genitalia:    deferred  Rectal:    deferred  Extremities:   No clubbing, cyanosis or edema.  Pulses:   2+ and symmetric all extremities  Skin:   Skin color, texture, turgor normal, no rashes or lesions  Lymph nodes:   Cervical, supraclavicular, and axillary nodes normal  Neurologic:   CNII-XII intact. Normal strength, sensation and reflexes      throughout    Labs on Admission:  Basic Metabolic Panel:  Recent Labs Lab 08/02/15 1800 08/03/15 0413 08/03/15 1430 08/04/15 0403 08/05/15 1354 08/07/15 0025  NA  --  130* 132* 135 133* 134*  K  --  3.0* 2.9* 3.1* 3.2* 2.8*  CL  --  103 104 108 104 106  CO2  --  24 23 21* 17* 13*  GLUCOSE  --  105* 113* 125* 161*  146*  BUN  --  18 13 11 14  22*  CREATININE 1.09 1.10 0.98 1.06 1.15 1.58*  CALCIUM  --  7.9* 7.8* 8.5* 9.4 10.3  MG 1.3*  --   --  2.1 1.7 2.0  PHOS  --   --  1.7* 2.1*  --   --    Liver Function Tests:  Recent Labs Lab 08/02/15 1248 08/03/15 0413 08/03/15 1430 08/04/15 0403 08/05/15 1354 08/07/15 0025  AST 16 10*  --   --  20 19  ALT 12* 9*  --   --  14* 18  ALKPHOS 48 36*  --   --  42 52  BILITOT 1.9* 1.2  --   --  1.1 0.8  PROT 6.5 5.2*  --   --  6.9 7.4  ALBUMIN 3.2* 2.7* 2.5* 2.9* 3.4* 3.7    Recent Labs Lab 08/02/15 1730 08/05/15 1354  LIPASE 21* 49   No results for input(s): AMMONIA in the last 168 hours. CBC:  Recent Labs Lab 08/02/15 1248 08/02/15 1800 08/03/15 0413 08/05/15 1354 08/07/15 0025  WBC 1.2* 1.3* 1.3* 1.3* 9.3  NEUTROABS 0.6*  --   --   --  5.2  HGB 11.6* 9.6* 9.3* 11.8* 12.8*  HCT 32.1* 28.1* 26.9* 33.7* 35.5*  MCV 96.4 97.2 98.2 98.3 97.8  PLT 127* 101* 100* 215 327   Cardiac Enzymes: No results for input(s): CKTOTAL, CKMB, CKMBINDEX, TROPONINI in the last 168 hours.  BNP (last 3 results) No results for input(s): PROBNP in the last 8760 hours. CBG: No results for input(s): GLUCAP in the last 168 hours.  Radiological Exams on Admission: Ct Abdomen Pelvis W Contrast  08/05/2015   CLINICAL DATA:  Abdominal pain. Esophageal cancer. Chronic pancreatitis. Nausea, vomiting, diarrhea.  EXAM: CT ABDOMEN AND PELVIS WITH CONTRAST  TECHNIQUE: Multidetector CT imaging  of the abdomen and pelvis was performed using the standard protocol following bolus administration of intravenous contrast.  CONTRAST:  178mL OMNIPAQUE IOHEXOL 300 MG/ML  SOLN  COMPARISON:  PET-CT 05/19/2015.  Radiographs 08/05/2015.  FINDINGS: Musculoskeletal:  No aggressive osseous lesions.  Lung Bases: Mild respiratory motion artifact is present. Coronary artery atherosclerosis is present. If office based assessment of coronary risk factors has not been performed, it is now  recommended.  In the visible lung bases, there is no evidence of previously seen pulmonary metastatic disease.  Liver: Old granulomatous disease with calcification in the LEFT hepatic dome.  Spleen:  Normal.  Gallbladder:  Normal.  Common bile duct:  Normal.  Pancreas:  Normal.  Adrenal glands:  Normal bilaterally.  Kidneys: Subcentimeter cyst in the LEFT interpolar kidney. Ureters appear normal.  Stomach: Patulous distal thoracic esophagus with hiatal hernia. No inflammatory changes of stomach. Mild distal esophageal thickening is present.  The upper abdominal lymph nodes seen on prior PET-CT have decreased in size with chemotherapy. Favorable response to treatment is present on upper abdominal adenopathy.  Small bowel: Enteritis is present with mucosal hyperenhancement and fluid filling small bowel. No obstruction.  Colon: Colitis is present with fluid filling the colon diffusely. Mild mucosal hyperenhancement is present. No obstruction. Calcification is present near the appendiceal orifice, probably ingested material.  Pelvic Genitourinary:  Urinary bladder appears normal.  Peritoneum: No free fluid.  No free air.  Vascular/lymphatic: Atherosclerosis.  No acute vascular abnormality.  Body Wall: Fat containing RIGHT inguinal hernia.  IMPRESSION: 1. Diffuse enteritis of small bowel and colon. 2. Decrease in size of regional metastatic disease from esophageal carcinoma.   Electronically Signed   By: Dereck Ligas M.D.   On: 08/05/2015 17:40   Dg Abd Acute W/chest  08/07/2015   CLINICAL DATA:  Acute onset of generalized abdominal pain and burning. Abdominal swelling. Nausea. Initial encounter.  EXAM: DG ABDOMEN ACUTE W/ 1V CHEST  COMPARISON:  CT of the abdomen and pelvis, and chest and abdominal radiographs performed 08/05/2015  FINDINGS: The lungs are well-aerated. Vascular congestion is noted. There is no evidence of focal opacification, pleural effusion or pneumothorax. The cardiomediastinal silhouette is  within normal limits. A right-sided chest port is noted ending about the cavoatrial junction.  The visualized bowel gas pattern is unremarkable. Scattered air and trace fluid are seen within the colon; there is no evidence of small bowel dilatation to suggest obstruction. No free intra-abdominal air is identified on the provided decubitus view.  No acute osseous abnormalities are seen; the sacroiliac joints are unremarkable in appearance.  IMPRESSION: 1. Vascular congestion noted; lungs remain grossly clear. 2. Unremarkable bowel gas pattern; no free intra-abdominal air seen.   Electronically Signed   By: Garald Balding M.D.   On: 08/07/2015 01:44   Dg Abd Acute W/chest  08/05/2015   CLINICAL DATA:  Increasing weakness and nausea, esophageal cancer post chemotherapy 1 day ago, atrial fibrillation, former smoker  EXAM: DG ABDOMEN ACUTE W/ 1V CHEST  COMPARISON:  Chest radiograph 08/02/2015  FINDINGS: RIGHT jugular Port-A-Cath with tip projecting over SVC just above cavoatrial junction.  Normal heart size, mediastinal contours, and pulmonary vascularity.  Lungs emphysematous but clear.  No infiltrate, pleural effusion or pneumothorax.  Mild gaseous distention of the transverse colon.  Paucity of small bowel gas.  No definite bowel wall thickening or free intraperitoneal air.  Osseous structures demineralized.  No urine tract calcification.  IMPRESSION: COPD changes without acute infiltrate.  Gaseous distention of transverse  colon, nonspecific, without evidence of bowel obstruction or perforation.   Electronically Signed   By: Lavonia Dana M.D.   On: 08/05/2015 15:07    EKG: Independently reviewed.  Assessment/Plan Active Problems:   Atrial fibrillation with RVR   Esophageal cancer   Diarrhea   Hyponatremia   Hypokalemia   Intractable nausea and vomiting   Enteritis   1. N/V/D and abdominal pain due to enteritis - 1. Empiric cipro/flagyl 2. Gi pathogen panel ordered 3. Dilaudid PRN 4. zofran and  compazine prn 2. Dehydration with hypokalemia and hyponatremia - 1. IVF 2. Replacing potassium IV 3. Recheck BMP in AM (also watch creatinine, is slightly up from baseline likely due to dehydration). 3. A.Fib RVR -  1. Rate control with Cardizem gtt 4. Elevated troponin - slight troponin bump is likely demand ischemia from HR of 150 earlier in evening, has only trended very slightly up from 0.17 to 0.19 thus far. 1. Serial trops ordered 2. No chest pain nor SOB at all per patient 5. Resolved Leukopenia that he had during prior admit  Put consult into epic (not called) as per our routine when admitting a chemo patient so patient shows up on their list.  Code Status: Full Code  Family Communication: Wife at bedside Disposition Plan: Admit to inpatient   Time spent: 7 min  GARDNER, JARED M. [facility] Pager [phone]  If 7AM-7PM, please contact the day team taking care of the patient [email or web address] Password [facility] 08/07/2015, 5:31 AM

## 2015-08-08 DIAGNOSIS — B370 Candidal stomatitis: Secondary | ICD-10-CM

## 2015-08-08 DIAGNOSIS — D709 Neutropenia, unspecified: Secondary | ICD-10-CM

## 2015-08-08 DIAGNOSIS — K529 Noninfective gastroenteritis and colitis, unspecified: Secondary | ICD-10-CM

## 2015-08-08 DIAGNOSIS — C155 Malignant neoplasm of lower third of esophagus: Secondary | ICD-10-CM

## 2015-08-08 DIAGNOSIS — I4891 Unspecified atrial fibrillation: Secondary | ICD-10-CM

## 2015-08-08 DIAGNOSIS — N289 Disorder of kidney and ureter, unspecified: Secondary | ICD-10-CM

## 2015-08-08 DIAGNOSIS — E876 Hypokalemia: Secondary | ICD-10-CM

## 2015-08-08 LAB — CBC
HCT: 30.5 % — ABNORMAL LOW (ref 39.0–52.0)
Hemoglobin: 10.3 g/dL — ABNORMAL LOW (ref 13.0–17.0)
MCH: 32.8 pg (ref 26.0–34.0)
MCHC: 33.8 g/dL (ref 30.0–36.0)
MCV: 97.1 fL (ref 78.0–100.0)
PLATELETS: 230 10*3/uL (ref 150–400)
RBC: 3.14 MIL/uL — ABNORMAL LOW (ref 4.22–5.81)
RDW: 16.8 % — ABNORMAL HIGH (ref 11.5–15.5)
WBC: 13 10*3/uL — ABNORMAL HIGH (ref 4.0–10.5)

## 2015-08-08 LAB — BASIC METABOLIC PANEL
Anion gap: 7 (ref 5–15)
BUN: 24 mg/dL — AB (ref 6–20)
CALCIUM: 9.1 mg/dL (ref 8.9–10.3)
CO2: 16 mmol/L — ABNORMAL LOW (ref 22–32)
CREATININE: 1.46 mg/dL — AB (ref 0.61–1.24)
Chloride: 107 mmol/L (ref 101–111)
GFR calc Af Amer: 58 mL/min — ABNORMAL LOW (ref >60)
GFR calc non Af Amer: 50 mL/min — ABNORMAL LOW (ref >60)
Glucose, Bld: 118 mg/dL — ABNORMAL HIGH (ref 65–99)
POTASSIUM: 2.8 mmol/L — AB (ref 3.5–5.1)
SODIUM: 130 mmol/L — AB (ref 135–145)

## 2015-08-08 LAB — C DIFFICILE QUICK SCREEN W PCR REFLEX
C DIFFICILE (CDIFF) INTERP: NEGATIVE
C DIFFICILE (CDIFF) TOXIN: NEGATIVE
C Diff antigen: NEGATIVE

## 2015-08-08 LAB — T4: T4, Total: 9.8 ug/dL (ref 4.5–12.0)

## 2015-08-08 MED ORDER — POTASSIUM CHLORIDE CRYS ER 20 MEQ PO TBCR
40.0000 meq | EXTENDED_RELEASE_TABLET | ORAL | Status: AC
  Administered 2015-08-08 (×2): 40 meq via ORAL
  Filled 2015-08-08 (×2): qty 2

## 2015-08-08 MED ORDER — ASPIRIN 81 MG PO CHEW
81.0000 mg | CHEWABLE_TABLET | Freq: Every day | ORAL | Status: DC
  Administered 2015-08-08 – 2015-08-12 (×5): 81 mg via ORAL
  Filled 2015-08-08 (×5): qty 1

## 2015-08-08 MED ORDER — POTASSIUM CHLORIDE 10 MEQ/100ML IV SOLN
10.0000 meq | INTRAVENOUS | Status: AC
  Administered 2015-08-08 (×6): 10 meq via INTRAVENOUS
  Filled 2015-08-08 (×5): qty 100

## 2015-08-08 MED ORDER — FLUCONAZOLE IN SODIUM CHLORIDE 200-0.9 MG/100ML-% IV SOLN
200.0000 mg | INTRAVENOUS | Status: DC
  Administered 2015-08-08 – 2015-08-10 (×3): 200 mg via INTRAVENOUS
  Filled 2015-08-08 (×5): qty 100

## 2015-08-08 NOTE — Progress Notes (Signed)
Writer gave report to receiving nurse, "Sonia Baller", at this time. Pt and spouse aware of pt transfer to room 1427.

## 2015-08-08 NOTE — Progress Notes (Addendum)
PROGRESS NOTE  Willie Owens CNO:[id number] DOB: Aug 04, 1951 DOA: 08/06/2015 PCP: Willie Noon, MD   HPI: Willie Owens is a 64 y.o. male with h/o esophageal adenocarcinoma on chemotherapy. He was just on our service from 8/6 to 8/8 for N/V/D due to enteritis as well as leukopenia for which they put him on neulasta. Was discharged home, but has began to feel worse with worsening N/V/D over the past couple of days since going home with development of worsening generalized abdominal pain today. He presents to the ED and was admitted, and developed A fib with RVR  Subjective / 24 H Interval events - feeling well this morning,   Assessment/Plan: Active Problems:   Atrial fibrillation with RVR   Esophageal cancer   Diarrhea   Hyponatremia   Hypokalemia   Intractable nausea and vomiting   Enteritis   Atrial fibrillation with rapid ventricular response  Nausea/vomiting/diarrhea - Patient recently hospitalized for nausea vomiting and diarrhea which improved, however, symptoms getting worse and patient came back to the hospital  - continue empiric antibiotics  - last time he was hospitalized his C diff was reported negative however I am unable to find the negative lab. Will check a C diff PCR as GI pathogen panel will take a while - GI pathogen panel pending  Hypokalemia. - Initial lab work showing potassium of 2.8, down from 3.2 on 08/05/2015.  - Likely secondary to GI losses from multiple episodes of nausea and vomiting.  - persistently low at 2.8, replete  Atrial fibrillation with rapid ventricular response. - Initially he presented with A. fib RVR having ventricular rates in the 130s, started on Cardizem drip. By the time he arrived to the step down unit heart rates improving - A. fib likely secondary to electrolyte abnormalities and dehydration from GI losses - has a history of A fib in the setting of grave's disease, s/p cardioversion in 2014, was on Xarelto and this  was discontinued in January 2015.  - currently on Cardizem 120 and propranolol, he is in sinus rhythm this morning - patient's CHA2DS2-VASc Score for Stroke Risk is 0. Patient has no CHF, no history of HTN, < 65, not a diabetic, no prior CVA, no known vascular disease, male.  - start aspirin alone - discussed with Willie Owens over the phone  AKI - likely in the setting of dehydration - continue IVF, creatinine improving this morning, continue to monitor with daily BMPs  Probable demand ischemia. - Troponins mildly elevated at 0.15 and 0.19. Aspect secondary to demand ischemia from A. fib with RVR. - Will monitor closely. - no chest pain  Esophageal cancer - Patient currently receiving treatment at the cancer center and follows Dr. Marin Owens of Medical Oncology. - He was recently given Neulasta for leukopenia.   - WBC at 13. Continue to monitor  Oral thrush - continue Diflucan   Diet: Diet clear liquid Room service appropriate?: Yes; Fluid consistency:: Thin Fluids: NS DVT Prophylaxis: heparin  Code Status: Full Code Family Communication: no family bedside  Disposition Plan: transfer to telemetry   Consultants:  Oncology   Procedures:  None    Antibiotics Ciprofloxacin 8/11 >> Metronidazole 8/11 >> Diflucan 8/12 >>   Studies  Dg Abd Acute W/chest  08/07/2015   CLINICAL DATA:  Acute onset of generalized abdominal pain and burning. Abdominal swelling. Nausea. Initial encounter.  EXAM: DG ABDOMEN ACUTE W/ 1V CHEST  COMPARISON:  CT of the abdomen and pelvis, and chest and abdominal radiographs performed  08/05/2015  FINDINGS: The lungs are well-aerated. Vascular congestion is noted. There is no evidence of focal opacification, pleural effusion or pneumothorax. The cardiomediastinal silhouette is within normal limits. A right-sided chest port is noted ending about the cavoatrial junction.  The visualized bowel gas pattern is unremarkable. Scattered air and trace fluid are  seen within the colon; there is no evidence of small bowel dilatation to suggest obstruction. No free intra-abdominal air is identified on the provided decubitus view.  No acute osseous abnormalities are seen; the sacroiliac joints are unremarkable in appearance.  IMPRESSION: 1. Vascular congestion noted; lungs remain grossly clear. 2. Unremarkable bowel gas pattern; no free intra-abdominal air seen.   Electronically Signed   By: Willie Owens M.D.   On: 08/07/2015 01:44    Objective  Filed Vitals:   08/08/15 0000 08/08/15 0400 08/08/15 0800 08/08/15 0900  BP: 125/65 112/56 124/77   Pulse: 63 59 42 52  Temp:      TempSrc:      Resp: 13 16 18 18   Height:      Weight:      SpO2: 100% 98% 95% 100%    Intake/Output Summary (Last 24 hours) at 08/08/15 1201 Last data filed at 08/08/15 0600  Gross per 24 hour  Intake   2075 ml  Output      0 ml  Net   2075 ml   Filed Weights   08/07/15 0700  Weight: 84.5 kg (186 lb 4.6 oz)    Exam:  GENERAL: NAD  HEENT: NCAT. Extraocular muscles are intact. Pupils are equal, round, and reactive to light and accommodation. Mucous membranes are moist. Oral thrush  NECK: Supple. No carotid bruits. No lymphadenopathy or thyromegaly.  LUNGS: Clear to auscultation. No wheezing or crackles  HEART: Regular rate and rhythm without murmur. 2+ pulses, no JVD, no peripheral edema  ABDOMEN: Soft, nontender, and nondistended. Positive bowel sounds. No hepatosplenomegaly was noted.  EXTREMITIES: Without any cyanosis, clubbing, rash, lesions or edema.  NEUROLOGIC: Alert and oriented x3. Cranial nerves II through XII are grossly intact. Strength 5/5 in all 4.  PSYCHIATRIC: Normal mood and affect  SKIN: No ulceration or induration present.  Data Reviewed: Basic Metabolic Panel:  Recent Labs Lab 08/02/15 1800  08/03/15 1430 08/04/15 0403 08/05/15 1354 08/07/15 0025 08/08/15 0500  NA  --   < > 132* 135 133* 134* 130*  K  --   < > 2.9* 3.1* 3.2*  2.8* 2.8*  CL  --   < > 104 108 104 106 107  CO2  --   < > 23 21* 17* 13* 16*  GLUCOSE  --   < > 113* 125* 161* 146* 118*  BUN  --   < > 13 11 14  22* 24*  CREATININE 1.09  < > 0.98 1.06 1.15 1.58* 1.46*  CALCIUM  --   < > 7.8* 8.5* 9.4 10.3 9.1  MG 1.3*  --   --  2.1 1.7 2.0  --   PHOS  --   --  1.7* 2.1*  --   --   --   < > = values in this interval not displayed. Liver Function Tests:  Recent Labs Lab 08/02/15 1248 08/03/15 0413 08/03/15 1430 08/04/15 0403 08/05/15 1354 08/07/15 0025  AST 16 10*  --   --  20 19  ALT 12* 9*  --   --  14* 18  ALKPHOS 48 36*  --   --  42 52  BILITOT 1.9* 1.2  --   --  1.1 0.8  PROT 6.5 5.2*  --   --  6.9 7.4  ALBUMIN 3.2* 2.7* 2.5* 2.9* 3.4* 3.7    Recent Labs Lab 08/02/15 1730 08/05/15 1354  LIPASE 21* 49   CBC:  Recent Labs Lab 08/02/15 1248 08/02/15 1800 08/03/15 0413 08/05/15 1354 08/07/15 0025 08/08/15 0500  WBC 1.2* 1.3* 1.3* 1.3* 9.3 13.0*  NEUTROABS 0.6*  --   --   --  5.2  --   HGB 11.6* 9.6* 9.3* 11.8* 12.8* 10.3*  HCT 32.1* 28.1* 26.9* 33.7* 35.5* 30.5*  MCV 96.4 97.2 98.2 98.3 97.8 97.1  PLT 127* 101* 100* 215 327 230   Cardiac Enzymes:  Recent Labs Lab 08/07/15 0844 08/07/15 1510 08/07/15 2055  TROPONINI 0.13* 0.09* 0.06*    Recent Results (from the past 240 hour(s))  Culture, blood (routine x 2)     Status: None (Preliminary result)   Collection Time: 08/07/15 12:34 AM  Result Value Ref Range Status   Specimen Description BLOOD RIGHT WRIST  Final   Special Requests BOTTLES DRAWN AEROBIC AND ANAEROBIC 5CC  Final   Culture PENDING  Incomplete   Report Status PENDING  Incomplete  MRSA PCR Screening     Status: None   Collection Time: 08/07/15  6:51 AM  Result Value Ref Range Status   MRSA by PCR NEGATIVE NEGATIVE Final    Comment:        The GeneXpert MRSA Assay (FDA approved for NASAL specimens only), is one component of a comprehensive MRSA colonization surveillance program. It is not intended  to diagnose MRSA infection nor to guide or monitor treatment for MRSA infections.      Scheduled Meds: . ALIGN  1 capsule Oral Q breakfast  . ciprofloxacin  400 mg Intravenous Q12H  . diltiazem  120 mg Oral Daily  . dronabinol  5 mg Oral BID AC  . fluconazole (DIFLUCAN) IV  200 mg Intravenous Q24H  . heparin  5,000 Units Subcutaneous 3 times per day  . lipase/protease/amylase  36,000 Units Oral TID AC  . loratadine  10 mg Oral Daily  . magnesium oxide  400 mg Oral BID  . metronidazole  500 mg Intravenous Q8H  . pantoprazole  80 mg Oral Daily  . potassium chloride  10 mEq Intravenous Q1 Hr x 6  . propranolol ER  80 mg Oral QHS  . pyridOXINE  200 mg Oral Daily  . sodium chloride  3 mL Intravenous Q12H  . venlafaxine  37.5 mg Oral BID   Continuous Infusions: . sodium chloride 1,000 mL (08/08/15 1105)    Marzetta Board, MD [facility] Pager [phone]. If 7 PM - 7 AM, please contact night-coverage at [email or web address], password [facility] 08/08/2015, 12:01 PM  LOS: 1 day

## 2015-08-08 NOTE — Progress Notes (Signed)
Pt has been transferred to room 1427. Pt and spouse aware.

## 2015-08-09 LAB — GI PATHOGEN PANEL BY PCR, STOOL
C difficile toxin A/B: NOT DETECTED
Campylobacter by PCR: NOT DETECTED
Cryptosporidium by PCR: NOT DETECTED
E coli (ETEC) LT/ST: NOT DETECTED
E coli (STEC): NOT DETECTED
E coli 0157 by PCR: NOT DETECTED
G lamblia by PCR: NOT DETECTED
Norovirus GI/GII: NOT DETECTED
Rotavirus A by PCR: NOT DETECTED
SALMONELLA BY PCR: NOT DETECTED
SHIGELLA BY PCR: NOT DETECTED

## 2015-08-09 LAB — BASIC METABOLIC PANEL
ANION GAP: 8 (ref 5–15)
BUN: 18 mg/dL (ref 6–20)
CO2: 14 mmol/L — AB (ref 22–32)
CREATININE: 1.31 mg/dL — AB (ref 0.61–1.24)
Calcium: 8.3 mg/dL — ABNORMAL LOW (ref 8.9–10.3)
Chloride: 112 mmol/L — ABNORMAL HIGH (ref 101–111)
GFR calc Af Amer: >60 mL/min (ref >60)
GFR calc non Af Amer: 57 mL/min — ABNORMAL LOW (ref >60)
Glucose, Bld: 96 mg/dL (ref 65–99)
POTASSIUM: 2.7 mmol/L — AB (ref 3.5–5.1)
Sodium: 134 mmol/L — ABNORMAL LOW (ref 135–145)

## 2015-08-09 LAB — CBC
HCT: 27.8 % — ABNORMAL LOW (ref 39.0–52.0)
HEMOGLOBIN: 9.3 g/dL — AB (ref 13.0–17.0)
MCH: 32.9 pg (ref 26.0–34.0)
MCHC: 33.5 g/dL (ref 30.0–36.0)
MCV: 98.2 fL (ref 78.0–100.0)
Platelets: 219 10*3/uL (ref 150–400)
RBC: 2.83 MIL/uL — AB (ref 4.22–5.81)
RDW: 17 % — AB (ref 11.5–15.5)
WBC: 12.9 10*3/uL — AB (ref 4.0–10.5)

## 2015-08-09 LAB — POTASSIUM: Potassium: 3 mmol/L — ABNORMAL LOW (ref 3.5–5.1)

## 2015-08-09 MED ORDER — DILTIAZEM HCL 30 MG PO TABS
30.0000 mg | ORAL_TABLET | Freq: Two times a day (BID) | ORAL | Status: DC
  Administered 2015-08-09: 30 mg via ORAL
  Filled 2015-08-09: qty 1

## 2015-08-09 MED ORDER — POTASSIUM CHLORIDE CRYS ER 20 MEQ PO TBCR
40.0000 meq | EXTENDED_RELEASE_TABLET | ORAL | Status: AC
  Administered 2015-08-09 (×3): 40 meq via ORAL
  Filled 2015-08-09 (×3): qty 2

## 2015-08-09 NOTE — Progress Notes (Signed)
Notified MD per page of pt's nonsustained Loletha Grayer of 37

## 2015-08-09 NOTE — Progress Notes (Addendum)
PROGRESS NOTE  Willie Owens MGN:[id number] DOB: 1951-10-14 DOA: 08/06/2015 PCP: Chesley Noon, MD   HPI: Willie Owens is a 64 y.o. male with h/o esophageal adenocarcinoma on chemotherapy. He was just on our service from 8/6 to 8/8 for N/V/D due to enteritis as well as leukopenia for which they put him on neulasta. Was discharged home, but has began to feel worse with worsening N/V/D over the past couple of days since going home with development of worsening generalized abdominal pain today. He presents to the ED and was admitted, and developed A fib with RVR  Subjective / 24 H Interval events - Reports his diarrhea is slowing down - no chest pain, shortness of breath, no abdominal pain, nausea or vomiting.   Assessment/Plan: Active Problems:   Atrial fibrillation with RVR   Esophageal cancer   Diarrhea   Hyponatremia   Hypokalemia   Intractable nausea and vomiting   Enteritis   Atrial fibrillation with rapid ventricular response  Nausea/vomiting/diarrhea - Patient recently hospitalized for nausea vomiting and diarrhea which improved, however, symptoms getting worse and patient came back to the hospital  - continue empiric antibiotics  - C diff negative - GI pathogen panel ordered on admission, pending - advance diet today   Hypokalemia. - Initial lab work showing potassium of 2.8, down from 3.2 on 08/05/2015.  - Likely secondary to GI losses from multiple episodes of nausea and vomiting.   - persistently low at 2.7, replete; recheck K level 5 pm  Atrial fibrillation with rapid ventricular response. - Initially he presented with A. fib RVR having ventricular rates in the 130s, started on Cardizem drip. By the time he arrived to the step down unit heart rates improving - A. 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 - patient's CHA2DS2-VASc Score for Stroke Risk is 0. Patient has no CHF, no history of HTN, < 65, not a diabetic, no prior CVA, no known vascular disease, male. Have started aspirin alone. I discussed with Dr. Debara Pickett over the phone  - remains in sinus, rates in the 50s when asleep, decrease Diltiazem from 120 mg daily to 60 (30 mg BID)  AKI - likely in the setting of dehydration - creatinine continues to improve with hydration. Repeat BMP in am   Probable demand ischemia. - Troponins mildly elevated at 0.15 and 0.19. Likely secondary to demand ischemia from A. fib with RVR.  Esophageal cancer - Patient currently receiving treatment at the cancer center and follows Dr. Marin [facility]. - He was recently given Neulasta for leukopenia.   - WBC at 12.9. Continue to monitor  Oral thrush - continue Diflucan   Diet: Diet regular Room service appropriate?: Yes; Fluid consistency:: Thin Fluids: NS DVT Prophylaxis: heparin  Code Status: Full Code Family Communication: no family bedside  Disposition Plan: transfer to telemetry   Consultants:  Oncology   Procedures:  None    Antibiotics Ciprofloxacin 8/11 >> Metronidazole 8/11 >> Diflucan 8/12 >>   Studies  No results found.  Objective  Filed Vitals:   08/08/15 1200 08/08/15 1316 08/08/15 2120 08/09/15 0641  BP: 122/75 124/61 114/53 91/41  Pulse: 55 55 54 63  Temp:  97.5 F (36.4 C) 97.5 F (36.4 C) 97.6 F (36.4 C)  TempSrc:  Oral Oral Oral  Resp:  12 18 18 18   Height:  6\' 6"  (1.981 m)    Weight:  84.5 kg (186 lb 4.6 oz)    SpO2: 100% 100% 100% 100%    Intake/Output Summary (Last 24 hours) at 08/09/15 1235 Last data filed at 08/08/15 2300  Gross per 24 hour  Intake   1490 ml  Output      0 ml  Net   1490 ml   Filed Weights   08/07/15 0700 08/08/15 1316  Weight: 84.5 kg (186 lb 4.6 oz) 84.5 kg (186 lb 4.6 oz)    Exam:  GENERAL: NAD  HEENT: NCAT. No  scleral icterus. Oral thrush improving  LUNGS: Clear to auscultation. No wheezing or crackles  HEART: Regular rate and rhythm without murmur. 2+ pulses  ABDOMEN: Soft, nontender, and nondistended. Positive bowel sounds.  NEUROLOGIC: Alert and oriented x3. Non focal  SKIN: No ulceration or induration present.  Data Reviewed: Basic Metabolic Panel:  Recent Labs Lab 08/02/15 1800  08/03/15 1430 08/04/15 0403 08/05/15 1354 08/07/15 0025 08/08/15 0500 08/09/15 0450  NA  --   < > 132* 135 133* 134* 130* 134*  K  --   < > 2.9* 3.1* 3.2* 2.8* 2.8* 2.7*  CL  --   < > 104 108 104 106 107 112*  CO2  --   < > 23 21* 17* 13* 16* 14*  GLUCOSE  --   < > 113* 125* 161* 146* 118* 96  BUN  --   < > 13 11 14  22* 24* 18  CREATININE 1.09  < > 0.98 1.06 1.15 1.58* 1.46* 1.31*  CALCIUM  --   < > 7.8* 8.5* 9.4 10.3 9.1 8.3*  MG 1.3*  --   --  2.1 1.7 2.0  --   --   PHOS  --   --  1.7* 2.1*  --   --   --   --   < > = values in this interval not displayed. Liver Function Tests:  Recent Labs Lab 08/02/15 1248 08/03/15 0413 08/03/15 1430 08/04/15 0403 08/05/15 1354 08/07/15 0025  AST 16 10*  --   --  20 19  ALT 12* 9*  --   --  14* 18  ALKPHOS 48 36*  --   --  42 52  BILITOT 1.9* 1.2  --   --  1.1 0.8  PROT 6.5 5.2*  --   --  6.9 7.4  ALBUMIN 3.2* 2.7* 2.5* 2.9* 3.4* 3.7    Recent Labs Lab 08/02/15 1730 08/05/15 1354  LIPASE 21* 49   CBC:  Recent Labs Lab 08/02/15 1248  08/03/15 0413 08/05/15 1354 08/07/15 0025 08/08/15 0500 08/09/15 0450  WBC 1.2*  < > 1.3* 1.3* 9.3 13.0* 12.9*  NEUTROABS 0.6*  --   --   --  5.2  --   --   HGB 11.6*  < > 9.3* 11.8* 12.8* 10.3* 9.3*  HCT 32.1*  < > 26.9* 33.7* 35.5* 30.5* 27.8*  MCV 96.4  < > 98.2 98.3 97.8 97.1 98.2  PLT 127*  < > 100* 215 327 230 219  < > = values in this interval not displayed. Cardiac Enzymes:  Recent Labs Lab 08/07/15 0844 08/07/15 1510 08/07/15 2055  TROPONINI 0.13* 0.09* 0.06*    Recent Results (from  the past 240 hour(s))  Culture, blood (routine x 2)     Status: None (Preliminary result)   Collection Time: 08/07/15 12:25 AM  Result Value Ref  Range Status   Specimen Description BLOOD PORT CATH  Final   Special Requests BOTTLES DRAWN AEROBIC AND ANAEROBIC 5CC  Final   Culture   Final    NO GROWTH 1 DAY Performed at [facility]    Report Status PENDING  Incomplete  Culture, blood (routine x 2)     Status: None (Preliminary result)   Collection Time: 08/07/15 12:34 AM  Result Value Ref Range Status   Specimen Description BLOOD RIGHT WRIST  Final   Special Requests BOTTLES DRAWN AEROBIC AND ANAEROBIC 5CC  Final   Culture   Final    NO GROWTH 1 DAY Performed at [facility]    Report Status PENDING  Incomplete  MRSA PCR Screening     Status: None   Collection Time: 08/07/15  6:51 AM  Result Value Ref Range Status   MRSA by PCR NEGATIVE NEGATIVE Final    Comment:        The GeneXpert MRSA Assay (FDA approved for NASAL specimens only), is one component of a comprehensive MRSA colonization surveillance program. It is not intended to diagnose MRSA infection nor to guide or monitor treatment for MRSA infections.   C difficile quick scan w PCR reflex     Status: None   Collection Time: 08/08/15  3:33 PM  Result Value Ref Range Status   C Diff antigen NEGATIVE NEGATIVE Final   C Diff toxin NEGATIVE NEGATIVE Final   C Diff interpretation Negative for toxigenic C. difficile  Final     Scheduled Meds: . ALIGN  1 capsule Oral Q breakfast  . aspirin  81 mg Oral Daily  . ciprofloxacin  400 mg Intravenous Q12H  . diltiazem  30 mg Oral Q12H  . dronabinol  5 mg Oral BID AC  . fluconazole (DIFLUCAN) IV  200 mg Intravenous Q24H  . heparin  5,000 Units Subcutaneous 3 times per day  . lipase/protease/amylase  36,000 Units Oral TID AC  . loratadine  10 mg Oral Daily  . magnesium oxide  400 mg Oral BID  . metronidazole  500 mg Intravenous Q8H  . pantoprazole  80 mg  Oral Daily  . potassium chloride  40 mEq Oral Q3H  . propranolol ER  80 mg Oral QHS  . pyridOXINE  200 mg Oral Daily  . sodium chloride  3 mL Intravenous Q12H  . venlafaxine  37.5 mg Oral BID   Continuous Infusions: . sodium chloride 1,000 mL (08/09/15 1023)    Marzetta Board, MD [facility] Pager [phone]. If 7 PM - 7 AM, please contact night-coverage at [email or web address], password [facility] 08/09/2015, 12:35 PM  LOS: 2 days

## 2015-08-10 LAB — POTASSIUM: POTASSIUM: 3.2 mmol/L — AB (ref 3.5–5.1)

## 2015-08-10 LAB — BASIC METABOLIC PANEL
Anion gap: 6 (ref 5–15)
BUN: 12 mg/dL (ref 6–20)
CO2: 14 mmol/L — ABNORMAL LOW (ref 22–32)
Calcium: 8.3 mg/dL — ABNORMAL LOW (ref 8.9–10.3)
Chloride: 116 mmol/L — ABNORMAL HIGH (ref 101–111)
Creatinine, Ser: 1.19 mg/dL (ref 0.61–1.24)
Glucose, Bld: 151 mg/dL — ABNORMAL HIGH (ref 65–99)
POTASSIUM: 2.6 mmol/L — AB (ref 3.5–5.1)
SODIUM: 136 mmol/L (ref 135–145)

## 2015-08-10 LAB — MAGNESIUM: Magnesium: 1.5 mg/dL — ABNORMAL LOW (ref 1.7–2.4)

## 2015-08-10 MED ORDER — MAGNESIUM SULFATE 4 GM/100ML IV SOLN
4.0000 g | Freq: Once | INTRAVENOUS | Status: AC
  Administered 2015-08-10: 4 g via INTRAVENOUS
  Filled 2015-08-10: qty 100

## 2015-08-10 MED ORDER — POTASSIUM CHLORIDE CRYS ER 20 MEQ PO TBCR
40.0000 meq | EXTENDED_RELEASE_TABLET | ORAL | Status: AC
  Administered 2015-08-10 (×4): 40 meq via ORAL
  Filled 2015-08-10 (×4): qty 2

## 2015-08-10 MED ORDER — LOPERAMIDE HCL 2 MG PO CAPS
2.0000 mg | ORAL_CAPSULE | ORAL | Status: DC | PRN
  Administered 2015-08-10: 2 mg via ORAL
  Filled 2015-08-10: qty 1

## 2015-08-10 MED ORDER — POTASSIUM CHLORIDE CRYS ER 20 MEQ PO TBCR
40.0000 meq | EXTENDED_RELEASE_TABLET | ORAL | Status: AC
  Administered 2015-08-10 (×2): 40 meq via ORAL
  Filled 2015-08-10 (×2): qty 2

## 2015-08-10 NOTE — Progress Notes (Signed)
PROGRESS NOTE  TREGAN READ OIZ:[id number] DOB: [birth year] DOA: 08/06/2015 PCP: Chesley Noon, MD   HPI: Willie Owens is a 64 y.o. male with h/o esophageal adenocarcinoma on chemotherapy. He was just on our service from 8/6 to 8/8 for N/V/D due to enteritis as well as leukopenia for which they put him on neulasta. Was discharged home, but has began to feel worse with worsening N/V/D over the past couple of days since going home with development of worsening generalized abdominal pain today. He presents to the ED and was admitted, and developed A fib with RVR  Subjective / 24 H Interval events - ongoing loose BMs - no chest pain, shortness of breath.   Assessment/Plan: Active Problems:   Atrial fibrillation with RVR   Esophageal cancer   Diarrhea   Hyponatremia   Hypokalemia   Intractable nausea and vomiting   Enteritis   Atrial fibrillation with rapid ventricular response  Nausea/vomiting/diarrhea - Patient recently hospitalized for nausea vomiting and diarrhea which improved, however, symptoms getting worse and patient came back to the hospital  - continue empiric antibiotics  - C diff negative - GI pathogen panel negative - with infectious workup negative, start Imodium - eating better, discontinue IVF  Hypokalemia. - Initial lab work showing potassium of 2.8, down from 3.2 on 08/05/2015.  - Likely secondary t GI losses from multiple episodes of nausea and vomiting.  - low at 2.6, aggressively replete today. If diarrhea slows with imodium hopefully will decrease losses. Recheck level in the afternoon  Hypomagnesemia - due to GI losses, replete IV  Atrial fibrillation with rapid ventricular response. - Initially he presented with A. fib RVR having ventricular rates in the 130s, started on Cardizem drip. By the time he arrived to the step down unit heart rates improving - A. 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 - patient's CHA2DS2-VASc Score for Stroke Risk is 0. Patient has no CHF, no history of HTN, < 65, not a diabetic, no prior CVA, no known vascular disease, male. Have started aspirin alone. I discussed with Dr. Debara Pickett over the phone  - remains in sinus, bradycardic to 37 yesterday afternoon, will discontinue diltiazem and continue only his propranolol  AKI - likely in the setting of dehydration - improved  Probable demand ischemia. - Troponins mildly elevated at 0.15 and 0.19. Likely secondary to demand ischemia from A. fib with RVR.  Esophageal cancer - Patient currently receiving treatment at the cancer center and follows Dr. Marin Olp [facility]. - He was recently given Neulasta for leukopenia.   Oral thrush - continue Diflucan   Diet: Diet regular Room service appropriate?: Yes; Fluid consistency:: Thin Fluids: NS DVT Prophylaxis: heparin  Code Status: Full Code Family Communication: no family bedside  Disposition Plan: transfer to telemetry   Consultants:  Oncology   Procedures:  None    Antibiotics Ciprofloxacin 8/11 >> Metronidazole 8/11 >> Diflucan 8/12 >>   Studies  No results found.  Objective  Filed Vitals:   08/09/15 0641 08/09/15 1435 08/09/15 2022 08/10/15 0500  BP: 91/41 100/52 117/61 125/53  Pulse: 63 55 57 54  Temp: 97.6 F (36.4 C) 98.1 F (36.7 C) 97.4 F (36.3 C) 97.4 F (36.3 C)  TempSrc: Oral Oral Oral Oral  Resp: 18 16 18 18   Height:  Weight:      SpO2: 100% 100% 100% 100%    Intake/Output Summary (Last 24 hours) at 08/10/15 1028 Last data filed at 08/10/15 0600  Gross per 24 hour  Intake   4230 ml  Output      0 ml  Net   4230 ml   Filed Weights   08/07/15 0700 08/08/15 1316  Weight: 84.5 kg (186 lb 4.6 oz) 84.5 kg (186 lb 4.6 oz)    Exam:  GENERAL: NAD  HEENT:  NCAT. No scleral icterus. Oral thrush improving  LUNGS: Clear to auscultation. No wheezing or crackles  HEART: Regular rate and rhythm without murmur. 2+ pulses  ABDOMEN: Soft, nontender, and nondistended. Positive bowel sounds.  NEUROLOGIC: Alert and oriented x3. Non focal  SKIN: No ulceration or induration present.  Data Reviewed: Basic Metabolic Panel:  Recent Labs Lab 08/03/15 1430 08/04/15 0403 08/05/15 1354 08/07/15 0025 08/08/15 0500 08/09/15 0450 08/09/15 1703 08/10/15 0540  NA 132* 135 133* 134* 130* 134*  --  136  K 2.9* 3.1* 3.2* 2.8* 2.8* 2.7* 3.0* 2.6*  CL 104 108 104 106 107 112*  --  116*  CO2 23 21* 17* 13* 16* 14*  --  14*  GLUCOSE 113* 125* 161* 146* 118* 96  --  151*  BUN 13 11 14  22* 24* 18  --  12  CREATININE 0.98 1.06 1.15 1.58* 1.46* 1.31*  --  1.19  CALCIUM 7.8* 8.5* 9.4 10.3 9.1 8.3*  --  8.3*  MG  --  2.1 1.7 2.0  --   --   --  1.5*  PHOS 1.7* 2.1*  --   --   --   --   --   --    Liver Function Tests:  Recent Labs Lab 08/03/15 1430 08/04/15 0403 08/05/15 1354 08/07/15 0025  AST  --   --  20 19  ALT  --   --  14* 18  ALKPHOS  --   --  42 52  BILITOT  --   --  1.1 0.8  PROT  --   --  6.9 7.4  ALBUMIN 2.5* 2.9* 3.4* 3.7    Recent Labs Lab 08/05/15 1354  LIPASE 49   CBC:  Recent Labs Lab 08/05/15 1354 08/07/15 0025 08/08/15 0500 08/09/15 0450  WBC 1.3* 9.3 13.0* 12.9*  NEUTROABS  --  5.2  --   --   HGB 11.8* 12.8* 10.3* 9.3*  HCT 33.7* 35.5* 30.5* 27.8*  MCV 98.3 97.8 97.1 98.2  PLT 215 327 230 219   Cardiac Enzymes:  Recent Labs Lab 08/07/15 0844 08/07/15 1510 08/07/15 2055  TROPONINI 0.13* 0.09* 0.06*    Recent Results (from the past 240 hour(s))  Culture, blood (routine x 2)     Status: None (Preliminary result)   Collection Time: 08/07/15 12:25 AM  Result Value Ref Range Status   Specimen Description BLOOD PORT CATH  Final   Special Requests BOTTLES DRAWN AEROBIC AND ANAEROBIC 5CC  Final   Culture    Final    NO GROWTH 2 DAYS Performed at [facility]    Report Status PENDING  Incomplete  Culture, blood (routine x 2)     Status: None (Preliminary result)   Collection Time: 08/07/15 12:34 AM  Result Value Ref Range Status   Specimen Description BLOOD RIGHT WRIST  Final   Special Requests BOTTLES DRAWN AEROBIC AND ANAEROBIC 5CC  Final   Culture   Final  NO GROWTH 2 DAYS Performed at [facility]    Report Status PENDING  Incomplete  MRSA PCR Screening     Status: None   Collection Time: 08/07/15  6:51 AM  Result Value Ref Range Status   MRSA by PCR NEGATIVE NEGATIVE Final    Comment:        The GeneXpert MRSA Assay (FDA approved for NASAL specimens only), is one component of a comprehensive MRSA colonization surveillance program. It is not intended to diagnose MRSA infection nor to guide or monitor treatment for MRSA infections.   C difficile quick scan w PCR reflex     Status: None   Collection Time: 08/08/15  3:33 PM  Result Value Ref Range Status   C Diff antigen NEGATIVE NEGATIVE Final   C Diff toxin NEGATIVE NEGATIVE Final   C Diff interpretation Negative for toxigenic C. difficile  Final     Scheduled Meds: . ALIGN  1 capsule Oral Q breakfast  . aspirin  81 mg Oral Daily  . ciprofloxacin  400 mg Intravenous Q12H  . dronabinol  5 mg Oral BID AC  . fluconazole (DIFLUCAN) IV  200 mg Intravenous Q24H  . heparin  5,000 Units Subcutaneous 3 times per day  . lipase/protease/amylase  36,000 Units Oral TID AC  . loratadine  10 mg Oral Daily  . magnesium sulfate 1 - 4 g bolus IVPB  4 g Intravenous Once  . metronidazole  500 mg Intravenous Q8H  . pantoprazole  80 mg Oral Daily  . potassium chloride  40 mEq Oral Q3H  . propranolol ER  80 mg Oral QHS  . pyridOXINE  200 mg Oral Daily  . sodium chloride  3 mL Intravenous Q12H  . venlafaxine  37.5 mg Oral BID   Continuous Infusions: . sodium chloride 1,000 mL (08/10/15 0024)    Marzetta Board,  MD [facility] Pager [phone]. If 7 PM - 7 AM, please contact night-coverage at [email or web address], password [facility] 08/10/2015, 10:28 AM  LOS: 3 days

## 2015-08-10 NOTE — Progress Notes (Signed)
Pt has had at least 5 liquid green stools in past 24 hours.  Potassium this AM is 2.6.  I have notified the on call and am awaiting response.

## 2015-08-11 ENCOUNTER — Inpatient Hospital Stay (HOSPITAL_COMMUNITY): Payer: 59

## 2015-08-11 ENCOUNTER — Ambulatory Visit: Payer: 59

## 2015-08-11 ENCOUNTER — Other Ambulatory Visit: Payer: 59

## 2015-08-11 DIAGNOSIS — N19 Unspecified kidney failure: Secondary | ICD-10-CM | POA: Insufficient documentation

## 2015-08-11 LAB — BASIC METABOLIC PANEL
ANION GAP: 6 (ref 5–15)
BUN: 14 mg/dL (ref 6–20)
CALCIUM: 8.5 mg/dL — AB (ref 8.9–10.3)
CO2: 14 mmol/L — AB (ref 22–32)
Chloride: 117 mmol/L — ABNORMAL HIGH (ref 101–111)
Creatinine, Ser: 1.55 mg/dL — ABNORMAL HIGH (ref 0.61–1.24)
GFR calc Af Amer: 54 mL/min — ABNORMAL LOW (ref >60)
GFR, EST NON AFRICAN AMERICAN: 46 mL/min — AB (ref >60)
GLUCOSE: 128 mg/dL — AB (ref 65–99)
POTASSIUM: 3.4 mmol/L — AB (ref 3.5–5.1)
Sodium: 137 mmol/L (ref 135–145)

## 2015-08-11 LAB — URINE MICROSCOPIC-ADD ON

## 2015-08-11 LAB — CBC
HEMATOCRIT: 30.1 % — AB (ref 39.0–52.0)
HEMOGLOBIN: 9.8 g/dL — AB (ref 13.0–17.0)
MCH: 32.3 pg (ref 26.0–34.0)
MCHC: 32.6 g/dL (ref 30.0–36.0)
MCV: 99.3 fL (ref 78.0–100.0)
Platelets: 216 10*3/uL (ref 150–400)
RBC: 3.03 MIL/uL — ABNORMAL LOW (ref 4.22–5.81)
RDW: 18.1 % — AB (ref 11.5–15.5)
WBC: 15.7 10*3/uL — ABNORMAL HIGH (ref 4.0–10.5)

## 2015-08-11 LAB — URINALYSIS, ROUTINE W REFLEX MICROSCOPIC
Bilirubin Urine: NEGATIVE
GLUCOSE, UA: NEGATIVE mg/dL
HGB URINE DIPSTICK: NEGATIVE
Ketones, ur: NEGATIVE mg/dL
Nitrite: NEGATIVE
PH: 5.5 (ref 5.0–8.0)
Protein, ur: 30 mg/dL — AB
SPECIFIC GRAVITY, URINE: 1.019 (ref 1.005–1.030)
Urobilinogen, UA: 0.2 mg/dL (ref 0.0–1.0)

## 2015-08-11 LAB — MAGNESIUM: Magnesium: 2 mg/dL (ref 1.7–2.4)

## 2015-08-11 MED ORDER — LACTATED RINGERS IV SOLN
INTRAVENOUS | Status: DC
  Administered 2015-08-11 – 2015-08-12 (×3): via INTRAVENOUS

## 2015-08-11 MED ORDER — ZOLPIDEM TARTRATE 5 MG PO TABS
5.0000 mg | ORAL_TABLET | Freq: Once | ORAL | Status: AC
Start: 2015-08-11 — End: 2015-08-11
  Administered 2015-08-11: 5 mg via ORAL
  Filled 2015-08-11: qty 1

## 2015-08-11 MED ORDER — POTASSIUM CHLORIDE CRYS ER 20 MEQ PO TBCR
40.0000 meq | EXTENDED_RELEASE_TABLET | ORAL | Status: AC
  Administered 2015-08-11 (×2): 40 meq via ORAL
  Filled 2015-08-11 (×2): qty 2

## 2015-08-11 MED ORDER — FLUCONAZOLE 100 MG PO TABS
100.0000 mg | ORAL_TABLET | Freq: Every day | ORAL | Status: DC
  Administered 2015-08-11 – 2015-08-12 (×2): 100 mg via ORAL
  Filled 2015-08-11 (×2): qty 1

## 2015-08-11 NOTE — Progress Notes (Signed)
PROGRESS NOTE  Willie Owens OQH:[id number] DOB: 23-Jan-1951 DOA: 08/06/2015 PCP: Chesley Noon, MD   HPI: Willie Owens is a 65 y.o. male with h/o esophageal adenocarcinoma on chemotherapy. He was just on our service from 8/6 to 8/8 for N/V/D due to enteritis as well as leukopenia for which they put him on neulasta. Was discharged home, but has began to feel worse with worsening N/V/D over the past couple of days since going home with development of worsening generalized abdominal pain today. He presents to the ED and was admitted, and developed A fib with RVR. He was started on diltiazem IV which was transitioned to po. He converted back to sinus and became bradycardic as low as 37, thus diltiazem was stopped. Has maintained sinus rhythm since. He has been persistently hypokalemic due to diarrhea requiring aggressive supplementation.   Subjective / 24 H Interval events - less diarrhea - no chest pain, shortness of breath, no abdominal pain, nausea or vomiting.   Assessment/Plan: Active Problems:   Atrial fibrillation with RVR   Esophageal cancer   Diarrhea   Hyponatremia   Hypokalemia   Intractable nausea and vomiting   Enteritis   Atrial fibrillation with rapid ventricular response  Nausea/vomiting/diarrhea - Patient recently hospitalized for nausea vomiting and diarrhea which improved, however, symptoms getting worse and patient came back to the hospital  - continue empiric antibiotics  - C diff negative - GI pathogen panel negative - with infectious workup negative, started Imodium  Hypokalemia. - Initial lab work showing potassium of 2.8, down from 3.2 on 08/05/2015.  - Likely secondary t GI losses from multiple episodes of nausea and vomiting.  - K improved to 3.4, continue to supplement as well today   Hypomagnesemia - Mg better today, no need for repletion  AKI - likely in the setting of dehydration, improved with fluids. His fluids were discontinued on  8/14 as he is eating well, however his Cr increased again on 8/15. He doesn't have known history of kidney disease, will obtain a UA / renal US.  - restart IVF with ringer's lactate given acidosis and hyperchoremia  Atrial fibrillation with rapid ventricular response. - Initially he presented with A. fib RVR having ventricular rates in the 130s, started on Cardizem drip. By the time he arrived to the step down unit heart rates improving - A. 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 - patient's CHA2DS2-VASc Score for Stroke Risk is 0. Patient has no CHF, no history of HTN, < 65, not a diabetic, no prior CVA, no known vascular disease, male. Have started aspirin alone. I discussed with Dr. Debara Pickett over the phone - bradycardic to 37 8/13 afternoon, will discontinue diltiazem and continue only his propranolol  - telemetry reviewed today, he remains in sinus rhythm   Probable demand ischemia. - Troponins mildly elevated at 0.15 and 0.19. Likely secondary to demand ischemia from A. fib with RVR.  Esophageal cancer - Patient currently receiving treatment at the cancer center and follows Dr. Marin Olp [facility]. - He was recently given Neulasta for leukopenia.   Oral thrush - Diflucan   Diet: Diet regular Room service appropriate?: Yes; Fluid consistency:: Thin Fluids: NS DVT Prophylaxis: heparin  Code Status: Full Code Family Communication: no family bedside  Disposition Plan: home when ready, 1-2 days  pending renal function / K   Consultants:  Oncology   Procedures:  None    Antibiotics Ciprofloxacin 8/11 >> 8/14 Metronidazole 8/11 >> 8/14 Diflucan 8/12 >>   Studies  No results found.  Objective  Filed Vitals:   08/10/15 1345 08/10/15 2157 08/10/15 2338 08/11/15 0445  BP: 115/55  116/61 100/70 125/64  Pulse: 55 66 70 72  Temp: 97.8 F (36.6 C)   97.4 F (36.3 C)  TempSrc: Oral Oral  Axillary  Resp:  18  18  Height:      Weight:      SpO2: 100% 100%  100%    Intake/Output Summary (Last 24 hours) at 08/11/15 1104 Last data filed at 08/10/15 2200  Gross per 24 hour  Intake    700 ml  Output      0 ml  Net    700 ml   Filed Weights   08/07/15 0700 08/08/15 1316  Weight: 84.5 kg (186 lb 4.6 oz) 84.5 kg (186 lb 4.6 oz)    Exam:  GENERAL: NAD  HEENT: NCAT. No scleral icterus. Oral thrush resolved  LUNGS: Clear to auscultation. No wheezing or crackles  HEART: Regular rate and rhythm without murmur. 2+ pulses  ABDOMEN: Soft, nontender, and nondistended. Positive bowel sounds.  NEUROLOGIC: Alert and oriented x3. Non focal  SKIN: No ulceration or induration present.  Data Reviewed: Basic Metabolic Panel:  Recent Labs Lab 08/05/15 1354 08/07/15 0025 08/08/15 0500 08/09/15 0450 08/09/15 1703 08/10/15 0540 08/10/15 1505 08/11/15 0611  NA 133* 134* 130* 134*  --  136  --  137  K 3.2* 2.8* 2.8* 2.7* 3.0* 2.6* 3.2* 3.4*  CL 104 106 107 112*  --  116*  --  117*  CO2 17* 13* 16* 14*  --  14*  --  14*  GLUCOSE 161* 146* 118* 96  --  151*  --  128*  BUN 14 22* 24* 18  --  12  --  14  CREATININE 1.15 1.58* 1.46* 1.31*  --  1.19  --  1.55*  CALCIUM 9.4 10.3 9.1 8.3*  --  8.3*  --  8.5*  MG 1.7 2.0  --   --   --  1.5*  --  2.0   Liver Function Tests:  Recent Labs Lab 08/05/15 1354 08/07/15 0025  AST 20 19  ALT 14* 18  ALKPHOS 42 52  BILITOT 1.1 0.8  PROT 6.9 7.4  ALBUMIN 3.4* 3.7    Recent Labs Lab 08/05/15 1354  LIPASE 49   CBC:  Recent Labs Lab 08/05/15 1354 08/07/15 0025 08/08/15 0500 08/09/15 0450 08/11/15 0611  WBC 1.3* 9.3 13.0* 12.9* 15.7*  NEUTROABS  --  5.2  --   --   --   HGB 11.8* 12.8* 10.3* 9.3* 9.8*  HCT 33.7* 35.5* 30.5* 27.8* 30.1*  MCV 98.3 97.8 97.1 98.2 99.3  PLT 215 327 230 219 216   Cardiac  Enzymes:  Recent Labs Lab 08/07/15 0844 08/07/15 1510 08/07/15 2055  TROPONINI 0.13* 0.09* 0.06*    Recent Results (from the past 240 hour(s))  Culture, blood (routine x 2)     Status: None (Preliminary result)   Collection Time: 08/07/15 12:25 AM  Result Value Ref Range Status   Specimen Description BLOOD PORT CATH  Final   Special Requests BOTTLES DRAWN AEROBIC AND ANAEROBIC 5CC  Final   Culture   Final    NO GROWTH 3 DAYS Performed at [facility]  Report Status PENDING  Incomplete  Culture, blood (routine x 2)     Status: None (Preliminary result)   Collection Time: 08/07/15 12:34 AM  Result Value Ref Range Status   Specimen Description BLOOD RIGHT WRIST  Final   Special Requests BOTTLES DRAWN AEROBIC AND ANAEROBIC 5CC  Final   Culture   Final    NO GROWTH 3 DAYS Performed at [facility]    Report Status PENDING  Incomplete  MRSA PCR Screening     Status: None   Collection Time: 08/07/15  6:51 AM  Result Value Ref Range Status   MRSA by PCR NEGATIVE NEGATIVE Final    Comment:        The GeneXpert MRSA Assay (FDA approved for NASAL specimens only), is one component of a comprehensive MRSA colonization surveillance program. It is not intended to diagnose MRSA infection nor to guide or monitor treatment for MRSA infections.   C difficile quick scan w PCR reflex     Status: None   Collection Time: 08/08/15  3:33 PM  Result Value Ref Range Status   C Diff antigen NEGATIVE NEGATIVE Final   C Diff toxin NEGATIVE NEGATIVE Final   C Diff interpretation Negative for toxigenic C. difficile  Final     Scheduled Meds: . ALIGN  1 capsule Oral Q breakfast  . aspirin  81 mg Oral Daily  . dronabinol  5 mg Oral BID AC  . fluconazole  100 mg Oral Daily  . heparin  5,000 Units Subcutaneous 3 times per day  . lipase/protease/amylase  36,000 Units Oral TID AC  . loratadine  10 mg Oral Daily  . pantoprazole  80 mg Oral Daily  . potassium chloride  40 mEq  Oral Q3H  . propranolol ER  80 mg Oral QHS  . pyridOXINE  200 mg Oral Daily  . sodium chloride  3 mL Intravenous Q12H  . venlafaxine  37.5 mg Oral BID   Continuous Infusions: . lactated ringers 75 mL/hr at 08/11/15 0857    Marzetta Board, MD [facility] Pager [phone]. If 7 PM - 7 AM, please contact night-coverage at [email or web address], password [facility] 08/11/2015, 11:04 AM  LOS: 4 days

## 2015-08-11 NOTE — Progress Notes (Signed)
Willie Owens is looking a little better.  Stools are less.  No c/o pain.  No further thrush.  Potassium is 3.4.  Creatinine is up a little.   Looks like the Atrial fibrillation has resolved.  Only on sq Heparin - low dose.  Looks ecchymotic be eating a little bit better.  He probably needs some physical therapy while he is in the hospital.  He's had no bleeding.  On his physical exam, all of his vital signs are stable. Blood pressure 125/64. Temperature is 97.4. Pulse 72. Lungs are clear. Cardiac exam regular rate and rhythm. Abdomen is soft. Has good bowel sounds. Extremities shows no clubbing, cyanosis or edema. Neurological exam does show some memory issues. This might be a little bit better   Hopefully, he will be able to go home soon. One issue might be that his wife, who really takes care of him, has to do with her mother's death late last week. I'm sure she will be busy with this.  I appreciate all the great care that he is getting up on [place] 8:18

## 2015-08-11 NOTE — Progress Notes (Addendum)
Patient has only peed 200cc's today, bladder scan only showed 90cc's .  MD made aware, no new orders.  Will continue to monitor closely.

## 2015-08-12 LAB — CULTURE, BLOOD (ROUTINE X 2)
CULTURE: NO GROWTH
Culture: NO GROWTH

## 2015-08-12 LAB — BASIC METABOLIC PANEL
ANION GAP: 4 — AB (ref 5–15)
ANION GAP: 4 — AB (ref 5–15)
BUN: 11 mg/dL (ref 6–20)
BUN: 8 mg/dL (ref 6–20)
CALCIUM: 8.2 mg/dL — AB (ref 8.9–10.3)
CALCIUM: 8.2 mg/dL — AB (ref 8.9–10.3)
CHLORIDE: 115 mmol/L — AB (ref 101–111)
CO2: 16 mmol/L — AB (ref 22–32)
CO2: 17 mmol/L — AB (ref 22–32)
Chloride: 114 mmol/L — ABNORMAL HIGH (ref 101–111)
Creatinine, Ser: 1.39 mg/dL — ABNORMAL HIGH (ref 0.61–1.24)
Creatinine, Ser: 1.36 mg/dL — ABNORMAL HIGH (ref 0.61–1.24)
GFR calc non Af Amer: 53 mL/min — ABNORMAL LOW (ref >60)
GFR calc non Af Amer: 54 mL/min — ABNORMAL LOW (ref >60)
Glucose, Bld: 108 mg/dL — ABNORMAL HIGH (ref 65–99)
Glucose, Bld: 123 mg/dL — ABNORMAL HIGH (ref 65–99)
POTASSIUM: 3.3 mmol/L — AB (ref 3.5–5.1)
POTASSIUM: 3.6 mmol/L (ref 3.5–5.1)
Sodium: 135 mmol/L (ref 135–145)
Sodium: 135 mmol/L (ref 135–145)

## 2015-08-12 LAB — CBC
HEMATOCRIT: 26.4 % — AB (ref 39.0–52.0)
HEMOGLOBIN: 8.8 g/dL — AB (ref 13.0–17.0)
MCH: 33.1 pg (ref 26.0–34.0)
MCHC: 33.3 g/dL (ref 30.0–36.0)
MCV: 99.2 fL (ref 78.0–100.0)
Platelets: 189 10*3/uL (ref 150–400)
RBC: 2.66 MIL/uL — AB (ref 4.22–5.81)
RDW: 18.4 % — AB (ref 11.5–15.5)
WBC: 12.9 10*3/uL — AB (ref 4.0–10.5)

## 2015-08-12 MED ORDER — ASPIRIN 81 MG PO CHEW: 81.0000 mg | CHEWABLE_TABLET | Freq: Every day | ORAL | Status: AC

## 2015-08-12 MED ORDER — POTASSIUM CHLORIDE CRYS ER 20 MEQ PO TBCR
40.0000 meq | EXTENDED_RELEASE_TABLET | Freq: Two times a day (BID) | ORAL | Status: DC
  Administered 2015-08-12: 40 meq via ORAL
  Filled 2015-08-12: qty 2

## 2015-08-12 MED ORDER — SODIUM CHLORIDE 0.9 % IJ SOLN
10.0000 mL | INTRAMUSCULAR | Status: DC | PRN
  Administered 2015-08-12 (×2): 10 mL
  Filled 2015-08-12: qty 40

## 2015-08-12 MED ORDER — FLUCONAZOLE 100 MG PO TABS: 100.0000 mg | ORAL_TABLET | Freq: Every day | ORAL | Status: DC

## 2015-08-12 MED ORDER — POTASSIUM CHLORIDE CRYS ER 20 MEQ PO TBCR: 40.0000 meq | EXTENDED_RELEASE_TABLET | Freq: Once | ORAL | Status: DC

## 2015-08-12 MED ORDER — HEPARIN SOD (PORK) LOCK FLUSH 100 UNIT/ML IV SOLN: 500.0000 [IU] | INTRAVENOUS | Status: DC | PRN

## 2015-08-12 NOTE — Discharge Summary (Addendum)
Physician Discharge Summary  DARCEL FRANE HYI:[id number] DOB: [birth year] DOA: 08/06/2015  PCP: Chesley Noon, MD  Admit date: 08/06/2015 Discharge date: 08/26/2015  Time spent: 20 minutes  Recommendations for Outpatient Follow-up:  1. Follow up with PCP in 1-2 weeks 2. Please repeat renal panel in 1 week, focus on renal function and electrolytes  Discharge Diagnoses:  Principal Problem:   Intractable nausea and vomiting Active Problems:   Atrial fibrillation with RVR   Esophageal cancer   Diarrhea   Hyponatremia   Hypokalemia   Enteritis   Atrial fibrillation with rapid ventricular response   Renal failure   Discharge Condition: Improved  Diet recommendation: Regular  Filed Weights   08/07/15 0700 08/08/15 1316  Weight: 84.5 kg (186 lb 4.6 oz) 84.5 kg (186 lb 4.6 oz)    History of present illness:  Please see admit h and p from 8/11 for details. Briefly, pt re-presented with nausea, vomiting and diarrhea with abd pain after recently being discharged for the same. The patient presented with hyponatremia and hypokalemia. The patient was admitted for further work up.  Hospital Course:  Nausea/vomiting/diarrhea - Patient recently hospitalized for nausea vomiting and diarrhea which improved, however, symptoms getting worse and patient came back to the hospital  - Initially continued on empiric antibiotics  - C diff was negative - GI pathogen panel later returned negative - with infectious workup negative, started Imodium PRN  Hypokalemia. - Initial lab work showing potassium of 2.8, down from 3.2 on 08/05/2015.  - Likely secondary t GI losses from multiple episodes of nausea and vomiting.  - Potassium ultimately normalized with multiple doses of potassium replacement -Would repeat electrolytes in one week as an outpatient  Hypomagnesemia - Resolved  AKI - likely in the setting of dehydration, improved with fluids. His fluids were discontinued on 8/14 as he  is eating well, however his Cr increased again on 8/15.  UA / renal US was unremarkable - continued on IVF with ringer's lactate given acidosis and hyperchoremia - Renal function showed signs of improvement with hydration  Atrial fibrillation with rapid ventricular response. - Initially he presented with A. fib RVR having ventricular rates in the 130s, started on Cardizem drip. By the time he arrived to the step down unit heart rates improving - A. 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 - patient's CHA2DS2-VASc Score for Stroke Risk is 0. Patient has no CHF, no history of HTN, < 65, not a diabetic, no prior CVA, no known vascular disease, male. Have started aspirin alone. Dr. Cruzita Lederer had discussed with Dr. Debara Pickett over the phone earlier - bradycardic to 37 8/13 afternoon, discontinued diltiazem and continue only his propranolol  - telemetry has since remained in sinus rhythm   Probable demand ischemia. - Troponins mildly elevated at 0.15 and 0.19. Likely secondary to demand ischemia from A. fib with RVR.  Esophageal cancer - Patient currently receiving treatment at the cancer center and follows Dr. Marin Olp of Medical Oncology. - He was recently given Neulasta for leukopenia.   Oral thrush - Diflucan  Consultations:  Oncology  Discharge Exam: Filed Vitals:   08/11/15 1503 08/11/15 2049 08/12/15 0458 08/12/15 1416  BP: 123/62 115/60 111/58 95/58  Pulse: 64 72 73 69  Temp: 97.5 F (36.4 C) 98 F (36.7 C) 98.5 F (36.9  C) 97.8 F (36.6 C)  TempSrc: Oral Oral Oral Axillary  Resp: 20 20 18 16   Height:      Weight:      SpO2: 100% 100% 100% 90%    General: Awake, in nad Cardiovascular: regular, s1, s2 Respiratory: normal resp effort, no wheezing  Discharge Instructions     Medication  List    STOP taking these medications        magnesium oxide 400 (241.3 MG) MG tablet  Commonly known as:  MAG-OX      TAKE these medications        ALIGN 4 MG Caps  Take 1 capsule by mouth daily with breakfast.     aspirin 81 MG chewable tablet  Chew 1 tablet (81 mg total) by mouth daily.     benzonatate 100 MG capsule  Commonly known as:  TESSALON PERLES  Take 1 capsule (100 mg total) by mouth 3 (three) times daily as needed for cough.     bismuth subsalicylate 270 WC/37SE suspension  Commonly known as:  PEPTO BISMOL  Take 30 mLs by mouth every 6 (six) hours as needed for diarrhea or loose stools.     cetirizine 10 MG tablet  Commonly known as:  ZYRTEC  Take 10 mg by mouth daily as needed for allergies.     diphenhydrAMINE 25 MG tablet  Commonly known as:  BENADRYL  Take 25 mg by mouth every 6 (six) hours as needed for allergies.     diphenoxylate-atropine 2.5-0.025 MG per tablet  Commonly known as:  LOMOTIL  TAKE 1 TABLET BY MOUTH FOUR TIMES DAILY AS NEEDED FOR DIARRHEA     dronabinol 5 MG capsule  Commonly known as:  MARINOL  Take 1 capsule (5 mg total) by mouth 2 (two) times daily before a meal.     fluconazole 100 MG tablet  Commonly known as:  DIFLUCAN  Take 1 tablet (100 mg total) by mouth daily.     HYDROmorphone 4 MG tablet  Commonly known as:  DILAUDID  Take 1 tablet (4 mg total) by mouth every 4 (four) hours as needed for severe pain.     lidocaine-prilocaine cream  Commonly known as:  EMLA  Apply 1 application topically as needed.     lipase/protease/amylase 36000 UNITS Cpep capsule  Commonly known as:  CREON  Take 1 capsule (36,000 Units total) by mouth 3 (three) times daily before meals.     loperamide 2 MG tablet  Commonly known as:  IMODIUM A-D  Take 2 at onset of diarrhea, then 1 every 2hrs until 12hr without a BM. May take 2 tab every 4hrs at bedtime. If diarrhea recurs repeat.     multivitamin with minerals Tabs tablet  Take 1 tablet by  mouth daily.     omeprazole 40 MG capsule  Commonly known as:  PRILOSEC  Take 1 capsule (40 mg total) by mouth daily.     ondansetron 4 MG disintegrating tablet  Commonly known as:  ZOFRAN ODT  Take 1 tablet (4 mg total) by mouth every 8 (eight) hours as needed for nausea or vomiting.     ondansetron 8 MG tablet  Commonly known as:  ZOFRAN  Take 1 tablet (8 mg total) by mouth 2 (two) times daily. Start the day after chemo for 3 days. Then take as needed for nausea or vomiting.     pegfilgrastim 6 MG/0.6ML injection  Commonly known as:  NEULASTA  Inject 6 mg into the skin  once.     potassium chloride SA 20 MEQ tablet  Commonly known as:  K-DUR,KLOR-CON  Take 2 tablets (40 mEq total) by mouth 2 (two) times daily.     PRESCRIPTION MEDICATION  Chemo - [facility]     prochlorperazine 10 MG tablet  Commonly known as:  COMPAZINE  Take 10 mg by mouth every 6 (six) hours as needed for nausea or vomiting.     propranolol ER 60 MG 24 hr capsule  Commonly known as:  INDERAL LA  Take 1 capsule (60 mg total) by mouth at bedtime.     pyridOXINE 100 MG tablet  Commonly known as:  VITAMIN B-6  Take 2 pills at one time once a day.     SUPER B COMPLEX PO  Take 1 tablet by mouth daily.     traZODone 50 MG tablet  Commonly known as:  DESYREL  Take 1 tablet (50 mg total) by mouth at bedtime.     venlafaxine 37.5 MG tablet  Commonly known as:  EFFEXOR  TAKE 1 TABLET BY MOUTH TWICE DAILY     Vitamin D 2000 UNITS tablet  Take 2,000 Units by mouth daily.       Allergies  Allergen Reactions  . Penicillins Other (See Comments)    Hallucinations, from childhood   Follow-up Information    Follow up with [facility].   Why:  HHPT   Contact information:   [address] [place] [address] [phone]       Follow up with [facility].   Why:  rw   Contact information:   [address] [place] [address] [phone]        Follow up with Chesley Noon, MD. Schedule an appointment as soon as possible for a visit in 1 week.   Specialty:  Family Medicine   Why:  Hospital follow up   Contact information:   [place] [address] [phone]        The results of significant diagnostics from this hospitalization (including imaging, microbiology, ancillary and laboratory) are listed below for reference.    Significant Diagnostic Studies: Ct Abdomen Pelvis W Contrast  08/05/2015   CLINICAL DATA:  Abdominal pain. Esophageal cancer. Chronic pancreatitis. Nausea, vomiting, diarrhea.  EXAM: CT ABDOMEN AND PELVIS WITH CONTRAST  TECHNIQUE: Multidetector CT imaging of the abdomen and pelvis was performed using the standard protocol following bolus administration of intravenous contrast.  CONTRAST:  183mL OMNIPAQUE IOHEXOL 300 MG/ML  SOLN  COMPARISON:  PET-CT 05/19/2015.  Radiographs 08/05/2015.  FINDINGS: Musculoskeletal:  No aggressive osseous lesions.  Lung Bases: Mild respiratory motion artifact is present. Coronary artery atherosclerosis is present. If office based assessment of coronary risk factors has not been performed, it is now recommended.  In the visible lung bases, there is no evidence of previously seen pulmonary metastatic disease.  Liver: Old granulomatous disease with calcification in the LEFT hepatic dome.  Spleen:  Normal.  Gallbladder:  Normal.  Common bile duct:  Normal.  Pancreas:  Normal.  Adrenal glands:  Normal bilaterally.  Kidneys: Subcentimeter cyst in the LEFT interpolar kidney. Ureters appear normal.  Stomach: Patulous distal thoracic esophagus with hiatal hernia. No inflammatory changes of stomach. Mild distal esophageal thickening is present.  The upper abdominal lymph nodes seen on prior PET-CT have decreased in size with chemotherapy. Favorable response to treatment is present on upper abdominal adenopathy.  Small bowel: Enteritis is present with  mucosal hyperenhancement and  fluid filling small bowel. No obstruction.  Colon: Colitis is present with fluid filling the colon diffusely. Mild mucosal hyperenhancement is present. No obstruction. Calcification is present near the appendiceal orifice, probably ingested material.  Pelvic Genitourinary:  Urinary bladder appears normal.  Peritoneum: No free fluid.  No free air.  Vascular/lymphatic: Atherosclerosis.  No acute vascular abnormality.  Body Wall: Fat containing RIGHT inguinal hernia.  IMPRESSION: 1. Diffuse enteritis of small bowel and colon. 2. Decrease in size of regional metastatic disease from esophageal carcinoma.   Electronically Signed   By: Dereck Ligas M.D.   On: 08/05/2015 17:40   US Renal  08/11/2015   CLINICAL DATA:  Renal failure  EXAM: RENAL / URINARY TRACT ULTRASOUND COMPLETE  COMPARISON:  08/05/2015  FINDINGS: Right Kidney:  Length: 11.5 cm. Echogenicity within normal limits. No mass or hydronephrosis visualized.  Left Kidney:  Length: 11.9 cm. 1.1 cm cyst is again seen in the posterior lateral aspect of the left kidney.  Bladder:  Appears normal for degree of bladder distention.  IMPRESSION: Left renal cyst.  No other focal abnormality is noted.   Electronically Signed   By: Inez Catalina M.D.   On: 08/11/2015 15:26   Nm Pet Image Restag (ps) Skull Base To Thigh  08/26/2015   CLINICAL DATA:  Subsequent Treatment strategy for esophageal carcinoma.  EXAM: NUCLEAR MEDICINE PET SKULL BASE TO THIGH  TECHNIQUE: 9.3 mCi F-18 FDG was injected intravenously. Full-ring PET imaging was performed from the skull base to thigh after the radiotracer. CT data was obtained and used for attenuation correction and anatomic localization.  FASTING BLOOD GLUCOSE:  Value: 80 mg/dl  COMPARISON:  05/19/2015  FINDINGS: NECK  No hypermetabolic lymph nodes in the neck.  CHEST  No hypermetabolic mediastinal or hilar nodes. Similar appearance of low level FDG uptake associated with the thoracic esophagus. The SUV max is a equal to 5.84.  Previously 4.78. No hypermetabolic mediastinal lymph nodes identified. No hypermetabolic axillary or supraclavicular lymph nodes.  Interval improvement in pulmonary nodularity. Index lesion in the periphery of the right upper lobe measures 5 mm, image 19/series 6. Previously 6 mm. Index lesion within the anterior right upper lobe measures 2 mm, image 24/series 6. Previously 4 mm previous right lower lobe lung nodule measuring 4 mm has resolved in the interval. Previous left lower lobe 6 mm nodule has also resolved in the interval.  ABDOMEN/PELVIS  There has been interval resolution of previous index gastrohepatic ligament lymph node. The index retrocaval lymph node measures 7 mm and has an SUV max equal to 3.88. Previously this measured 10 mm within SUV max equal to 10.4. The index left periaortic lymph node measures 6 mm and has an SUV max equal to 5.1. Previously this measured 1.1 cm and had an SUV max equal to 10.6.  SKELETON  No focal hypermetabolic activity to suggest skeletal metastasis.  IMPRESSION: 1. Interval response to therapy. There has been decrease in size and number of pulmonary metastasis. 2. Improvement an hypermetabolic upper abdominal adenopathy.   Electronically Signed   By: Kerby Moors M.D.   On: 08/26/2015 10:57   Dg Chest Port 1 View  08/02/2015   CLINICAL DATA:  Esophageal adenocarcinoma status post chemotherapy. Chronic pancreatitis with nausea, vomiting and diarrhea.  EXAM: PORTABLE CHEST - 1 VIEW  COMPARISON:  PET-CT 05/19/2015.  Chest radiographs 11/28/2014.  FINDINGS: 1546 hours. Right IJ Port-A-Cath tip is unchanged at the level of the superior right atrium. The  heart size and mediastinal contours are stable. The lungs appear stable with interstitial prominence. No progressive nodularity identified. There is no confluent airspace opacity or pleural effusion. The bones appear unchanged.  IMPRESSION: Radiographically stable appearance of the chest without progressive nodularity.  Port-A-Cath unchanged at the upper right atrial level.   Electronically Signed   By: Richardean Sale M.D.   On: 08/02/2015 16:05   Dg Abd Acute W/chest  08/07/2015   CLINICAL DATA:  Acute onset of generalized abdominal pain and burning. Abdominal swelling. Nausea. Initial encounter.  EXAM: DG ABDOMEN ACUTE W/ 1V CHEST  COMPARISON:  CT of the abdomen and pelvis, and chest and abdominal radiographs performed 08/05/2015  FINDINGS: The lungs are well-aerated. Vascular congestion is noted. There is no evidence of focal opacification, pleural effusion or pneumothorax. The cardiomediastinal silhouette is within normal limits. A right-sided chest port is noted ending about the cavoatrial junction.  The visualized bowel gas pattern is unremarkable. Scattered air and trace fluid are seen within the colon; there is no evidence of small bowel dilatation to suggest obstruction. No free intra-abdominal air is identified on the provided decubitus view.  No acute osseous abnormalities are seen; the sacroiliac joints are unremarkable in appearance.  IMPRESSION: 1. Vascular congestion noted; lungs remain grossly clear. 2. Unremarkable bowel gas pattern; no free intra-abdominal air seen.   Electronically Signed   By: Garald Balding M.D.   On: 08/07/2015 01:44   Dg Abd Acute W/chest  08/05/2015   CLINICAL DATA:  Increasing weakness and nausea, esophageal cancer post chemotherapy 1 day ago, atrial fibrillation, former smoker  EXAM: DG ABDOMEN ACUTE W/ 1V CHEST  COMPARISON:  Chest radiograph 08/02/2015  FINDINGS: RIGHT jugular Port-A-Cath with tip projecting over SVC just above cavoatrial junction.  Normal heart size, mediastinal contours, and pulmonary vascularity.  Lungs emphysematous but clear.  No infiltrate, pleural effusion or pneumothorax.  Mild gaseous distention of the transverse colon.  Paucity of small bowel gas.  No definite bowel wall thickening or free intraperitoneal air.  Osseous structures demineralized.  No urine  tract calcification.  IMPRESSION: COPD changes without acute infiltrate.  Gaseous distention of transverse colon, nonspecific, without evidence of bowel obstruction or perforation.   Electronically Signed   By: Lavonia Dana M.D.   On: 08/05/2015 15:07    Microbiology: No results found for this or any previous visit (from the past 240 hour(s)).   Labs: Basic Metabolic Panel: No results for input(s): NA, K, CL, CO2, GLUCOSE, BUN, CREATININE, CALCIUM, MG, PHOS in the last 168 hours. Liver Function Tests: No results for input(s): AST, ALT, ALKPHOS, BILITOT, PROT, ALBUMIN in the last 168 hours. No results for input(s): LIPASE, AMYLASE in the last 168 hours. No results for input(s): AMMONIA in the last 168 hours. CBC: No results for input(s): WBC, NEUTROABS, HGB, HCT, MCV, PLT in the last 168 hours. Cardiac Enzymes: No results for input(s): CKTOTAL, CKMB, CKMBINDEX, TROPONINI in the last 168 hours. BNP: BNP (last 3 results) No results for input(s): BNP in the last 8760 hours.  ProBNP (last 3 results) No results for input(s): PROBNP in the last 8760 hours.  CBG: No results for input(s): GLUCAP in the last 168 hours.   Signed:  Juri Dinning, Orpah Melter  [facility] 08/26/2015, 9:50 PM

## 2015-08-12 NOTE — Care Management Note (Signed)
Case Management Note  Patient Details  Name: Willie Owens MRN: [id number] Date of Birth: 12-Sep-1951  Subjective/Objective:  PT-recc HHPT, 24hr supv, rw.Sopke to patient/spouse about [place] agencies, & answered ques about what are services are covered.Informed them that HHC-skilled services,ex.HHPT is a covered service, but 24hr supv(not a covered service-custodial care), Patient/spouse voiced understanding.[facility] chosen for [facility]. TC Kristen rep for [facility]-dme  aware of referral, await HHPT, rw orders.Once orders placed-patient only waiting for rw to be delivered to rm. Nsg notified.                  Action/Plan:d/c plan home w/HHC,rw.   Expected Discharge Date:   (unknown)               Expected Discharge Plan:  [place]  In-House Referral:  NA  Discharge planning Services  CM Consult  Post Acute Care Choice:  NA Choice offered to:  NA, Patient  DME Arranged:  N/A DME Agency:  NA  HH Arranged:  NA HH Agency:  NA  Status of Service:  In process, will continue to follow  Medicare Important Message Given:    Date Medicare IM Given:    Medicare IM give by:    Date Additional Medicare IM Given:    Additional Medicare Important Message give by:     If discussed at [place] of Stay Meetings, dates discussed:    Additional Comments:  Dessa Phi, RN 08/12/2015, 4:02 PM

## 2015-08-12 NOTE — Evaluation (Signed)
Physical Therapy Evaluation Patient Details Name: Willie Owens MRN: [id number] DOB: 06/27/1951 Today's Date: 08/12/2015   History of Present Illness  64 yo male admitted with enteritis, A fib with RVR. Hx of esophageal cancer, A fib, gout.   Clinical Impression  On eval, pt required Min-Mod assist for mobility-walked ~200 feet. Pt is unsteady and at risk for falls even with use of assistive device. Recommend HHPT and 24 hour supervision/assist. Feel RW will be beneficial to improve stability.     Follow Up Recommendations Home health PT;Supervision/Assistance - 24 hour    Equipment Recommendations  Rolling walker with 5" wheels (tall)    Recommendations for Other Services       Precautions / Restrictions Precautions Precautions: Fall Restrictions Weight Bearing Restrictions: No      Mobility  Bed Mobility Overal bed mobility: Needs Assistance Bed Mobility: Supine to Sit;Sit to Supine     Supine to sit: Supervision Sit to supine: Supervision   General bed mobility comments: for safety  Transfers Overall transfer level: Needs assistance Equipment used: Rolling walker (2 wheeled) Transfers: Sit to/from Stand Sit to Stand: Min assist;From elevated surface         General transfer comment: Assist to rise, stabilize, control desent. VCs safety, technique, hand placement  Ambulation/Gait Ambulation/Gait assistance: Min assist;Mod assist Ambulation Distance (Feet): 200 Feet (200'x1, 100'x1) Assistive device: Rolling walker (2 wheeled) (IV pole) Gait Pattern/deviations: Step-through pattern;Decreased stride length     General Gait Details: walked x 1 with IV pole-unsteady, requiring increased assistance as distance progressed: Min-Mod assist. . walked x1 with RW-improved stability: Min guard -Min assist.   Stairs            Wheelchair Mobility    Modified Rankin (Stroke Patients Only)       Balance Overall balance assessment: History of Falls;Needs  assistance         Standing balance support: Bilateral upper extremity supported;During functional activity Standing balance-Leahy Scale: Poor Standing balance comment: needs external support                             Pertinent Vitals/Pain Pain Assessment: Faces Faces Pain Scale: Hurts a little bit Pain Location: pain from hiccups Pain Intervention(s): Monitored during session;Repositioned    Home Living Family/patient expects to be discharged to:: Private residence Living Arrangements: Spouse/significant other Available Help at Discharge: Family Type of Home: House Home Access: Stairs to enter Entrance Stairs-Rails: None Technical brewer of Steps: 1 Home Layout: Multi-level Home Equipment: None      Prior Function Level of Independence: Independent               Hand Dominance        Extremity/Trunk Assessment   Upper Extremity Assessment: RUE deficits/detail;LUE deficits/detail     RUE Sensation: history of peripheral neuropathy     Lower Extremity Assessment: Generalized weakness;RLE deficits/detail;LLE deficits/detail      Cervical / Trunk Assessment: Normal  Communication   Communication: No difficulties  Cognition Arousal/Alertness: Awake/alert Behavior During Therapy: WFL for tasks assessed/performed   Area of Impairment: Memory;Safety/judgement;Problem solving     Memory: Decreased short-term memory;Decreased recall of precautions   Safety/Judgement: Decreased awareness of safety;Decreased awareness of deficits   Problem Solving: Requires tactile cues;Requires verbal cues      General Comments      Exercises        Assessment/Plan    PT Assessment Patient needs continued PT services  PT Diagnosis Difficulty walking;Abnormality of gait;Generalized weakness;Altered mental status   PT Problem List Decreased strength;Decreased activity tolerance;Decreased balance;Decreased mobility;Decreased safety  awareness;Decreased knowledge of use of DME;Decreased cognition;Decreased knowledge of precautions  PT Treatment Interventions DME instruction;Gait training;Functional mobility training;Therapeutic activities;Patient/family education;Therapeutic exercise;Balance training   PT Goals (Current goals can be found in the Care Plan section) Acute Rehab PT Goals Patient Stated Goal: none stated PT Goal Formulation: With family Time For Goal Achievement: 08/26/15 Potential to Achieve Goals: Good    Frequency Min 3X/week   Barriers to discharge        Co-evaluation               End of Session Equipment Utilized During Treatment: Gait belt Activity Tolerance: Patient limited by fatigue Patient left: with call bell/phone within reach;with bed alarm set           Time: 0867-6195 PT Time Calculation (min) (ACUTE ONLY): 36 min   Charges:   PT Evaluation $Initial PT Evaluation Tier I: 1 Procedure PT Treatments $Gait Training: 8-22 mins   PT G Codes:        Weston Anna, MPT Pager: [phone]

## 2015-08-12 NOTE — Progress Notes (Addendum)
Unable to schedule  patient follow up appointment due to office being closed.  Explained importance of following up with PCP and taking medications as prescribed.  VSS.  Prescriptions and AVS given to wife.  Wheeled out.

## 2015-08-18 ENCOUNTER — Ambulatory Visit: Payer: 59

## 2015-08-18 ENCOUNTER — Ambulatory Visit: Payer: 59 | Admitting: Hematology & Oncology

## 2015-08-18 ENCOUNTER — Other Ambulatory Visit: Payer: 59

## 2015-08-18 ENCOUNTER — Other Ambulatory Visit (HOSPITAL_COMMUNITY)
Admission: RE | Admit: 2015-08-18 | Discharge: 2015-08-18 | Disposition: A | Discharge status: Home / Self Care | Payer: 59 | Source: Other Acute Inpatient Hospital | Attending: Hematology & Oncology | Admitting: Hematology & Oncology

## 2015-08-18 DIAGNOSIS — T81500A Unspecified complication of foreign body accidentally left in body following surgical operation, initial encounter: Secondary | ICD-10-CM | POA: Diagnosis not present

## 2015-08-18 DIAGNOSIS — Y640 Contaminated medical or biological substance, transfused or infused: Secondary | ICD-10-CM | POA: Insufficient documentation

## 2015-08-18 LAB — COMPREHENSIVE METABOLIC PANEL
ALK PHOS: 41 U/L (ref 38–126)
ALT: 14 U/L — AB (ref 17–63)
AST: 22 U/L (ref 15–41)
Albumin: 2.9 g/dL — ABNORMAL LOW (ref 3.5–5.0)
Anion gap: 8 (ref 5–15)
BUN: 14 mg/dL (ref 6–20)
CALCIUM: 8.3 mg/dL — AB (ref 8.9–10.3)
CHLORIDE: 101 mmol/L (ref 101–111)
CO2: 24 mmol/L (ref 22–32)
CREATININE: 1.32 mg/dL — AB (ref 0.61–1.24)
GFR, EST NON AFRICAN AMERICAN: 56 mL/min — AB (ref >60)
Glucose, Bld: 85 mg/dL (ref 65–99)
Potassium: 4.6 mmol/L (ref 3.5–5.1)
Sodium: 133 mmol/L — ABNORMAL LOW (ref 135–145)
Total Bilirubin: 0.5 mg/dL (ref 0.3–1.2)
Total Protein: 5.5 g/dL — ABNORMAL LOW (ref 6.5–8.1)

## 2015-08-25 ENCOUNTER — Other Ambulatory Visit: Payer: 59

## 2015-08-25 ENCOUNTER — Ambulatory Visit: Payer: 59

## 2015-08-26 ENCOUNTER — Ambulatory Visit (HOSPITAL_COMMUNITY)
Admission: RE | Admit: 2015-08-26 | Discharge: 2015-08-26 | Disposition: A | Discharge status: Home / Self Care | Payer: 59 | Source: Ambulatory Visit | Attending: Hematology & Oncology | Admitting: Hematology & Oncology

## 2015-08-26 DIAGNOSIS — C159 Malignant neoplasm of esophagus, unspecified: Secondary | ICD-10-CM | POA: Insufficient documentation

## 2015-08-26 DIAGNOSIS — R590 Localized enlarged lymph nodes: Secondary | ICD-10-CM | POA: Insufficient documentation

## 2015-08-26 DIAGNOSIS — K868 Other specified diseases of pancreas: Secondary | ICD-10-CM | POA: Insufficient documentation

## 2015-08-26 DIAGNOSIS — K8681 Exocrine pancreatic insufficiency: Secondary | ICD-10-CM

## 2015-08-26 DIAGNOSIS — C7800 Secondary malignant neoplasm of unspecified lung: Secondary | ICD-10-CM | POA: Insufficient documentation

## 2015-08-26 MED ORDER — FLUDEOXYGLUCOSE F - 18 (FDG) INJECTION
9.3000 | Freq: Once | INTRAVENOUS | Status: DC | PRN
  Administered 2015-08-26: 9.3 via INTRAVENOUS
  Filled 2015-08-26: qty 9.3

## 2015-08-27 LAB — GLUCOSE, CAPILLARY: Glucose-Capillary: 84 mg/dL (ref 65–99)

## 2015-08-29 ENCOUNTER — Telehealth: Payer: Self-pay | Admitting: Hematology & Oncology

## 2015-08-29 NOTE — Telephone Encounter (Signed)
[employer] on behalf of [facility] requested medical records for 10/03/2014 - 12/18/2014  F: [phone]  Case: [id number]     COPY SCANNED

## 2015-09-02 ENCOUNTER — Other Ambulatory Visit: Payer: 59

## 2015-09-02 ENCOUNTER — Ambulatory Visit: Payer: 59

## 2015-09-03 ENCOUNTER — Ambulatory Visit (HOSPITAL_BASED_OUTPATIENT_CLINIC_OR_DEPARTMENT_OTHER): Payer: 59 | Admitting: Hematology & Oncology

## 2015-09-03 ENCOUNTER — Other Ambulatory Visit (HOSPITAL_BASED_OUTPATIENT_CLINIC_OR_DEPARTMENT_OTHER): Payer: 59

## 2015-09-03 ENCOUNTER — Ambulatory Visit (HOSPITAL_BASED_OUTPATIENT_CLINIC_OR_DEPARTMENT_OTHER): Payer: 59

## 2015-09-03 ENCOUNTER — Encounter: Payer: Self-pay | Admitting: Hematology & Oncology

## 2015-09-03 VITALS — BP 131/67 | HR 72 | Temp 97.6°F | Resp 16 | Ht 78.0 in | Wt 185.0 lb

## 2015-09-03 DIAGNOSIS — C159 Malignant neoplasm of esophagus, unspecified: Secondary | ICD-10-CM

## 2015-09-03 DIAGNOSIS — K8681 Exocrine pancreatic insufficiency: Secondary | ICD-10-CM

## 2015-09-03 DIAGNOSIS — C155 Malignant neoplasm of lower third of esophagus: Secondary | ICD-10-CM

## 2015-09-03 DIAGNOSIS — Z5111 Encounter for antineoplastic chemotherapy: Secondary | ICD-10-CM | POA: Diagnosis not present

## 2015-09-03 DIAGNOSIS — K529 Noninfective gastroenteritis and colitis, unspecified: Secondary | ICD-10-CM | POA: Diagnosis not present

## 2015-09-03 LAB — CBC WITH DIFFERENTIAL (CANCER CENTER ONLY)
BASO#: 0.1 10*3/uL (ref 0.0–0.2)
BASO%: 0.8 % (ref 0.0–2.0)
EOS ABS: 0.3 10*3/uL (ref 0.0–0.5)
EOS%: 5 % (ref 0.0–7.0)
HCT: 35.4 % — ABNORMAL LOW (ref 38.7–49.9)
HGB: 11.4 g/dL — ABNORMAL LOW (ref 13.0–17.1)
LYMPH#: 2 10*3/uL (ref 0.9–3.3)
LYMPH%: 29.3 % (ref 14.0–48.0)
MCH: 33.5 pg — AB (ref 28.0–33.4)
MCHC: 32.2 g/dL (ref 32.0–35.9)
MCV: 104 fL — ABNORMAL HIGH (ref 82–98)
MONO#: 0.7 10*3/uL (ref 0.1–0.9)
MONO%: 9.8 % (ref 0.0–13.0)
NEUT#: 3.7 10*3/uL (ref 1.5–6.5)
NEUT%: 55.1 % (ref 40.0–80.0)
PLATELETS: 273 10*3/uL (ref 145–400)
RBC: 3.4 10*6/uL — ABNORMAL LOW (ref 4.20–5.70)
RDW: 15.2 % (ref 11.1–15.7)
WBC: 6.7 10*3/uL (ref 4.0–10.0)

## 2015-09-03 LAB — CMP (CANCER CENTER ONLY)
ALT(SGPT): 16 U/L (ref 10–47)
AST: 24 U/L (ref 11–38)
Albumin: 3.1 g/dL — ABNORMAL LOW (ref 3.3–5.5)
Alkaline Phosphatase: 53 U/L (ref 26–84)
BUN: 15 mg/dL (ref 7–22)
CHLORIDE: 105 meq/L (ref 98–108)
CO2: 26 mEq/L (ref 18–33)
Calcium: 9.5 mg/dL (ref 8.0–10.3)
Creat: 1.3 mg/dl — ABNORMAL HIGH (ref 0.6–1.2)
GLUCOSE: 120 mg/dL — AB (ref 73–118)
POTASSIUM: 3.9 meq/L (ref 3.3–4.7)
Sodium: 139 mEq/L (ref 128–145)
Total Bilirubin: 0.7 mg/dl (ref 0.20–1.60)
Total Protein: 6.6 g/dL (ref 6.4–8.1)

## 2015-09-03 LAB — LACTATE DEHYDROGENASE: LDH: 133 U/L (ref 94–250)

## 2015-09-03 MED ORDER — SODIUM CHLORIDE 0.9 % IV SOLN: INTRAVENOUS | Status: AC

## 2015-09-03 MED ORDER — FLUOROURACIL CHEMO INJECTION 2.5 GM/50ML
400.0000 mg/m2 | Freq: Once | INTRAVENOUS | Status: AC
  Administered 2015-09-03: 900 mg via INTRAVENOUS
  Filled 2015-09-03: qty 18

## 2015-09-03 MED ORDER — SODIUM CHLORIDE 0.9 % IV SOLN
Freq: Once | INTRAVENOUS | Status: AC
  Administered 2015-09-03: 09:00:00 via INTRAVENOUS

## 2015-09-03 MED ORDER — SODIUM CHLORIDE 0.9 % IV SOLN
Freq: Once | INTRAVENOUS | Status: AC
  Administered 2015-09-03: 09:00:00 via INTRAVENOUS
  Filled 2015-09-03: qty 8

## 2015-09-03 MED ORDER — SODIUM CHLORIDE 0.9 % IJ SOLN
10.0000 mL | INTRAMUSCULAR | Status: DC | PRN
  Filled 2015-09-03: qty 10

## 2015-09-03 MED ORDER — ATROPINE SULFATE 1 MG/ML IJ SOLN
INTRAMUSCULAR | Status: AC
  Filled 2015-09-03: qty 1

## 2015-09-03 MED ORDER — ATROPINE SULFATE 1 MG/ML IJ SOLN
0.5000 mg | Freq: Once | INTRAMUSCULAR | Status: AC | PRN
  Administered 2015-09-03: 1 mg via INTRAVENOUS

## 2015-09-03 MED ORDER — POTASSIUM CHLORIDE CRYS ER 20 MEQ PO TBCR: 40.0000 meq | EXTENDED_RELEASE_TABLET | Freq: Every day | ORAL | Status: DC

## 2015-09-03 MED ORDER — LEUCOVORIN CALCIUM INJECTION 350 MG
400.0000 mg/m2 | Freq: Once | INTRAMUSCULAR | Status: AC
  Administered 2015-09-03: 900 mg via INTRAVENOUS
  Filled 2015-09-03: qty 45

## 2015-09-03 MED ORDER — HEPARIN SOD (PORK) LOCK FLUSH 100 UNIT/ML IV SOLN
500.0000 [IU] | Freq: Once | INTRAVENOUS | Status: DC | PRN
  Filled 2015-09-03: qty 5

## 2015-09-03 MED ORDER — FLUOROURACIL CHEMO INJECTION 5 GM/100ML
1980.0000 mg/m2 | INTRAVENOUS | Status: DC
  Administered 2015-09-03: 4450 mg via INTRAVENOUS
  Filled 2015-09-03: qty 89

## 2015-09-03 MED ORDER — IRINOTECAN HCL CHEMO INJECTION 100 MG/5ML
133.0000 mg/m2 | Freq: Once | INTRAVENOUS | Status: AC
  Administered 2015-09-03: 300 mg via INTRAVENOUS
  Filled 2015-09-03: qty 5

## 2015-09-03 NOTE — Patient Instructions (Signed)
[facility] Discharge Instructions for Patients Receiving Chemotherapy  Today you received the following chemotherapy agents Camptosar, Leucovorin and 5FU.   To help prevent nausea and vomiting after your treatment, we encourage you to take your nausea medication.   If you develop nausea and vomiting that is not controlled by your nausea medication, call the clinic.   BELOW ARE SYMPTOMS THAT SHOULD BE REPORTED IMMEDIATELY:  *FEVER GREATER THAN 100.5 F  *CHILLS WITH OR WITHOUT FEVER  NAUSEA AND VOMITING THAT IS NOT CONTROLLED WITH YOUR NAUSEA MEDICATION  *UNUSUAL SHORTNESS OF BREATH  *UNUSUAL BRUISING OR BLEEDING  TENDERNESS IN MOUTH AND THROAT WITH OR WITHOUT PRESENCE OF ULCERS  *URINARY PROBLEMS  *BOWEL PROBLEMS  UNUSUAL RASH Items with * indicate a potential emergency and should be followed up as soon as possible.  Feel free to call the clinic you have any questions or concerns. The clinic phone number is [phone].  Please show the CHEMO ALERT CARD at check-in to the Emergency Department and triage nurse.   

## 2015-09-03 NOTE — Addendum Note (Signed)
Addended by: Burney Gauze R on: 09/03/2015 12:11 PM   Modules accepted: Orders, Medications

## 2015-09-03 NOTE — Progress Notes (Signed)
Hematology and Oncology Follow Up Visit  Willie Owens [id number] 06-May-1951 64 y.o. 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 He is looking pretty good. He is swallowing okay. His wife says that he is just not eating all that much. He is not drinking much. He apparently was hospitalized because of hypokalemia and atrial fibrillation.  His wife, unfortunately, fell and broke her right wrist. She is in a cast for about a month.  His mother-in-law passed away recently. I took care of her. Unfortunately, there is a lot of issues with the family now. This is causing a lot of stress with his wife.  We went ahead and repeated a PET scan on. Thankfully, the PET scan showed that he was responding. Head decreased in the size and number of pulmonary metastasis. Some of his lymphadenopathy resolved.  He is not having any problems with pain. He's having no problems with nausea or vomiting. He's still having some diarrhea. His wife is trying her best to try to help with that.  Overall, his performance status is ECOG 1.   Medications:  Current outpatient prescriptions:  .  aspirin 81 MG chewable tablet, Chew 1 tablet (81 mg total) by mouth daily., Disp: , Rfl:  .  B Complex-C (SUPER B COMPLEX PO), Take 1 tablet by mouth daily., Disp: , Rfl:  .  benzonatate (TESSALON PERLES) 100 MG capsule, Take 1 capsule (100 mg total) by mouth 3 (three) times daily as needed for cough., Disp: 90 capsule, Rfl: 0 .  bismuth subsalicylate (PEPTO BISMOL) 262 MG/15ML suspension, Take 30 mLs by mouth every 6 (six) hours as needed for diarrhea or loose stools., Disp: , Rfl:  .  cetirizine (ZYRTEC) 10 MG tablet, Take 10 mg by mouth daily as needed for allergies. , Disp: , Rfl:  .  Cholecalciferol (VITAMIN D) 2000 UNITS tablet, Take 2,000 Units by  mouth daily., Disp: , Rfl:  .  diphenhydrAMINE (BENADRYL) 25 MG tablet, Take 25 mg by mouth every 6 (six) hours as needed for allergies., Disp: , Rfl:  .  diphenoxylate-atropine (LOMOTIL) 2.5-0.025 MG per tablet, TAKE 1 TABLET BY MOUTH FOUR TIMES DAILY AS NEEDED FOR DIARRHEA, Disp: 60 tablet, Rfl: 2 .  dronabinol (MARINOL) 5 MG capsule, Take 1 capsule (5 mg total) by mouth 2 (two) times daily before a meal., Disp: 60 capsule, Rfl: 0 .  lidocaine-prilocaine (EMLA) cream, Apply 1 application topically as needed. (Patient taking differently: Apply 1 application topically as needed (numbing port every 2 weeks). ), Disp: 30 g, Rfl: 10 .  lipase/protease/amylase (CREON) 36000 UNITS CPEP capsule, Take 1 capsule (36,000 Units total) by mouth 3 (three) times daily before meals., Disp: 90 capsule, Rfl: 4 .  loperamide (IMODIUM A-D) 2 MG tablet, Take 2 at onset of diarrhea, then 1 every 2hrs until 12hr without a BM. May take 2 tab every 4hrs at bedtime. If diarrhea recurs repeat., Disp: 100 tablet, Rfl: 1 .  MAGNESIUM-OXIDE 400 (241.3 MG) MG tablet, TK 1 T PO BID, Disp: , Rfl: 6 .  Multiple Vitamin (MULTIVITAMIN WITH MINERALS) TABS tablet, Take 1 tablet by mouth daily., Disp: , Rfl:  .  omeprazole (PRILOSEC) 40 MG capsule, Take 1 capsule (40 mg total) by mouth daily., Disp: 30 capsule, Rfl: 6 .  ondansetron (ZOFRAN ODT) 4  MG disintegrating tablet, Take 1 tablet (4 mg total) by mouth every 8 (eight) hours as needed for nausea or vomiting., Disp: 20 tablet, Rfl: 0 .  ondansetron (ZOFRAN) 8 MG tablet, Take 1 tablet (8 mg total) by mouth 2 (two) times daily. Start the day after chemo for 3 days. Then take as needed for nausea or vomiting., Disp: 30 tablet, Rfl: 1 .  pegfilgrastim (NEULASTA) 6 MG/0.6ML injection, Inject 6 mg into the skin once., Disp: , Rfl:  .  potassium chloride SA (K-DUR,KLOR-CON) 20 MEQ tablet, Take 2 tablets (40 mEq total) by mouth 2 (two) times daily., Disp: 10 tablet, Rfl: 0 .  PRESCRIPTION  MEDICATION, Chemo - [facility], Disp: , Rfl:  .  Probiotic Product (ALIGN) 4 MG CAPS, Take 1 capsule by mouth daily with breakfast. , Disp: , Rfl:  .  prochlorperazine (COMPAZINE) 10 MG tablet, Take 10 mg by mouth every 6 (six) hours as needed for nausea or vomiting. , Disp: , Rfl:  .  propranolol ER (INDERAL LA) 60 MG 24 hr capsule, Take 1 capsule (60 mg total) by mouth at bedtime., Disp: 30 capsule, Rfl: 6 .  pyridOXINE (VITAMIN B-6) 100 MG tablet, Take 2 pills at one time once a day. (Patient taking differently: Take 200 mg by mouth daily. ), Disp: 60 tablet, Rfl: 6 .  traZODone (DESYREL) 50 MG tablet, Take 1 tablet (50 mg total) by mouth at bedtime. (Patient taking differently: Take 50 mg by mouth at bedtime as needed for sleep. ), Disp: 30 tablet, Rfl: 3 .  venlafaxine (EFFEXOR) 37.5 MG tablet, TAKE 1 TABLET BY MOUTH TWICE DAILY (Patient taking differently: TAKE 37.5 MG BY MOUTH TWICE DAILY), Disp: 60 tablet, Rfl: 0  Allergies:  Allergies  Allergen Reactions  . Penicillins Other (See Comments)    Hallucinations, from childhood    Past Medical History, Surgical history, Social history, and Family History were reviewed and updated.  Review of Systems: As above  Physical Exam:  height is 6' 6"  (1.981 m) and weight is 185 lb (83.915 kg). His oral temperature is 97.6 F (36.4 C). His blood pressure is 131/67 and his pulse is 72. His respiration is 16.   Wt Readings from Last 3 Encounters:  09/03/15 185 lb (83.915 kg)  08/08/15 186 lb 4.6 oz (84.5 kg)  08/04/15 191 lb 1.6 oz (86.682 kg)     Well-developed and well-nourished white gym in no obvious distress. Head and neck exam shows no ocular or oral lesions. There are no palpable cervical or supraclavicular lymph nodes. Lungs are clear. Cardiac exam regular rate and rhythm with no murmurs, rubs or bruits. Abdomen is soft. There is no palpable abdominal mass. There is no fluid wave. There is no guarding or rebound tenderness. There is no  palpable liver or spleen tip. Back exam shows no tenderness over the spine, ribs or hips. Extremities shows no clubbing, cyanosis or edema. He has good strength in his joints. He has good range of motion of his joints. Neurological exam is nonfocal. Skin exam no rashes, ecchymoses or petechia.  Lab Results  Component Value Date   WBC 6.7 09/03/2015   HGB 11.4* 09/03/2015   HCT 35.4* 09/03/2015   MCV 104* 09/03/2015   PLT 273 09/03/2015     Chemistry      Component Value Date/Time   NA 139 09/03/2015 0751   NA 133* 08/18/2015 1550   K 3.9 09/03/2015 0751   K 4.6 08/18/2015 1550   CL 105  09/03/2015 0751   CL 101 08/18/2015 1550   CO2 26 09/03/2015 0751   CO2 24 08/18/2015 1550   BUN 15 09/03/2015 0751   BUN 14 08/18/2015 1550   CREATININE 1.3* 09/03/2015 0751   CREATININE 1.32* 08/18/2015 1550      Component Value Date/Time   CALCIUM 9.5 09/03/2015 0751   CALCIUM 8.3* 08/18/2015 1550   ALKPHOS 53 09/03/2015 0751   ALKPHOS 41 08/18/2015 1550   AST 24 09/03/2015 0751   AST 22 08/18/2015 1550   ALT 16 09/03/2015 0751   ALT 14* 08/18/2015 1550   BILITOT 0.70 09/03/2015 0751   BILITOT 0.5 08/18/2015 1550         Impression and Plan: Mr. Mukai is 64 year old gentleman. He has metastatic adenocarcinoma of the distal esophagus.  I'm glad that he is responding. As such, we will continue him on therapy.  I think we can probably have him come in weekly for IV fluids. I think this would help him with any dehydration. We'll also check his potassium and make sure that this is not going low.  We will plan to see him back in 2 more weeks.  I will do 4 more cycles and then repeat his PET scan.      [name], MD 9/7/20168:47 AM

## 2015-09-05 ENCOUNTER — Ambulatory Visit (HOSPITAL_BASED_OUTPATIENT_CLINIC_OR_DEPARTMENT_OTHER): Payer: 59

## 2015-09-05 VITALS — BP 125/88 | HR 64 | Temp 98.0°F | Resp 18

## 2015-09-05 DIAGNOSIS — Z5189 Encounter for other specified aftercare: Secondary | ICD-10-CM

## 2015-09-05 DIAGNOSIS — C159 Malignant neoplasm of esophagus, unspecified: Secondary | ICD-10-CM

## 2015-09-05 MED ORDER — PEGFILGRASTIM INJECTION 6 MG/0.6ML ~~LOC~~
PREFILLED_SYRINGE | SUBCUTANEOUS | Status: AC
  Filled 2015-09-05: qty 0.6

## 2015-09-05 MED ORDER — PEGFILGRASTIM INJECTION 6 MG/0.6ML
6.0000 mg | Freq: Once | SUBCUTANEOUS | Status: AC
  Administered 2015-09-05: 6 mg via SUBCUTANEOUS
  Filled 2015-09-05: qty 0.6

## 2015-09-05 MED ORDER — SODIUM CHLORIDE 0.9 % IJ SOLN
10.0000 mL | INTRAMUSCULAR | Status: DC | PRN
  Administered 2015-09-05: 10 mL
  Filled 2015-09-05: qty 10

## 2015-09-05 MED ORDER — HEPARIN SOD (PORK) LOCK FLUSH 100 UNIT/ML IV SOLN
500.0000 [IU] | Freq: Once | INTRAVENOUS | Status: AC | PRN
  Administered 2015-09-05: 500 [IU]
  Filled 2015-09-05: qty 5

## 2015-09-05 NOTE — Patient Instructions (Signed)
Pegfilgrastim injection What is this medicine? PEGFILGRASTIM (peg fil GRA stim) is a long-acting granulocyte colony-stimulating factor that stimulates the growth of neutrophils, a type of white blood cell important in the body's fight against infection. It is used to reduce the incidence of fever and infection in patients with certain types of cancer who are receiving chemotherapy that affects the bone marrow. This medicine may be used for other purposes; ask your health care provider or pharmacist if you have questions. COMMON BRAND NAME(S): Neulasta What should I tell my health care provider before I take this medicine? They need to know if you have any of these conditions: -latex allergy -ongoing radiation therapy -sickle cell disease -skin reactions to acrylic adhesives (On-Body Injector only) -an unusual or allergic reaction to pegfilgrastim, filgrastim, other medicines, foods, dyes, or preservatives -pregnant or trying to get pregnant -breast-feeding How should I use this medicine? This medicine is for injection under the skin. If you get this medicine at home, you will be taught how to prepare and give the pre-filled syringe or how to use the On-body Injector. Refer to the patient Instructions for Use for detailed instructions. Use exactly as directed. Take your medicine at regular intervals. Do not take your medicine more often than directed. It is important that you put your used needles and syringes in a special sharps container. Do not put them in a trash can. If you do not have a sharps container, call your pharmacist or healthcare provider to get one. Talk to your pediatrician regarding the use of this medicine in children. Special care may be needed. Overdosage: If you think you have taken too much of this medicine contact a poison control center or emergency room at once. NOTE: This medicine is only for you. Do not share this medicine with others. What if I miss a dose? It is  important not to miss your dose. Call your doctor or health care professional if you miss your dose. If you miss a dose due to an On-body Injector failure or leakage, a new dose should be administered as soon as possible using a single prefilled syringe for manual use. What may interact with this medicine? Interactions have not been studied. Give your health care provider a list of all the medicines, herbs, non-prescription drugs, or dietary supplements you use. Also tell them if you smoke, drink alcohol, or use illegal drugs. Some items may interact with your medicine. This list may not describe all possible interactions. Give your health care provider a list of all the medicines, herbs, non-prescription drugs, or dietary supplements you use. Also tell them if you smoke, drink alcohol, or use illegal drugs. Some items may interact with your medicine. What should I watch for while using this medicine? You may need blood work done while you are taking this medicine. If you are going to need a MRI, CT scan, or other procedure, tell your doctor that you are using this medicine (On-Body Injector only). What side effects may I notice from receiving this medicine? Side effects that you should report to your doctor or health care professional as soon as possible: -allergic reactions like skin rash, itching or hives, swelling of the face, lips, or tongue -dizziness -fever -pain, redness, or irritation at site where injected -pinpoint red spots on the skin -shortness of breath or breathing problems -stomach or side pain, or pain at the shoulder -swelling -tiredness -trouble passing urine Side effects that usually do not require medical attention (report to your doctor   or health care professional if they continue or are bothersome): -bone pain -muscle pain This list may not describe all possible side effects. Call your doctor for medical advice about side effects. You may report side effects to FDA at  1-800-FDA-1088. Where should I keep my medicine? Keep out of the reach of children. Store pre-filled syringes in a refrigerator between 2 and 8 degrees C (36 and 46 degrees F). Do not freeze. Keep in carton to protect from light. Throw away this medicine if it is left out of the refrigerator for more than 48 hours. Throw away any unused medicine after the expiration date. NOTE: This sheet is a summary. It may not cover all possible information. If you have questions about this medicine, talk to your doctor, pharmacist, or health care provider.  2015, [employer] Standard. (2014-03-14 16:14:05)  

## 2015-09-08 ENCOUNTER — Other Ambulatory Visit: Payer: 59

## 2015-09-08 ENCOUNTER — Ambulatory Visit: Payer: 59

## 2015-09-10 ENCOUNTER — Ambulatory Visit (HOSPITAL_BASED_OUTPATIENT_CLINIC_OR_DEPARTMENT_OTHER): Payer: 59

## 2015-09-10 ENCOUNTER — Other Ambulatory Visit (HOSPITAL_BASED_OUTPATIENT_CLINIC_OR_DEPARTMENT_OTHER): Payer: 59

## 2015-09-10 VITALS — BP 118/63 | HR 73 | Temp 98.2°F | Resp 18

## 2015-09-10 DIAGNOSIS — C159 Malignant neoplasm of esophagus, unspecified: Secondary | ICD-10-CM

## 2015-09-10 DIAGNOSIS — K529 Noninfective gastroenteritis and colitis, unspecified: Secondary | ICD-10-CM

## 2015-09-10 LAB — CBC WITH DIFFERENTIAL (CANCER CENTER ONLY)
BASO#: 0 10*3/uL (ref 0.0–0.2)
BASO%: 0.1 % (ref 0.0–2.0)
EOS%: 1.1 % (ref 0.0–7.0)
Eosinophils Absolute: 0.1 10*3/uL (ref 0.0–0.5)
HEMATOCRIT: 32.7 % — AB (ref 38.7–49.9)
HGB: 10.7 g/dL — ABNORMAL LOW (ref 13.0–17.1)
LYMPH#: 1.9 10*3/uL (ref 0.9–3.3)
LYMPH%: 25.4 % (ref 14.0–48.0)
MCH: 32.8 pg (ref 28.0–33.4)
MCHC: 32.7 g/dL (ref 32.0–35.9)
MCV: 100 fL — AB (ref 82–98)
MONO#: 0.7 10*3/uL (ref 0.1–0.9)
MONO%: 9.7 % (ref 0.0–13.0)
NEUT#: 4.7 10*3/uL (ref 1.5–6.5)
NEUT%: 63.7 % (ref 40.0–80.0)
PLATELETS: 165 10*3/uL (ref 145–400)
RBC: 3.26 10*6/uL — AB (ref 4.20–5.70)
RDW: 14.9 % (ref 11.1–15.7)
WBC: 7.4 10*3/uL (ref 4.0–10.0)

## 2015-09-10 LAB — CMP (CANCER CENTER ONLY)
ALT(SGPT): 12 U/L (ref 10–47)
AST: 20 U/L (ref 11–38)
Albumin: 3.6 g/dL (ref 3.3–5.5)
Alkaline Phosphatase: 84 U/L (ref 26–84)
BILIRUBIN TOTAL: 1.3 mg/dL (ref 0.20–1.60)
BUN, Bld: 15 mg/dL (ref 7–22)
CALCIUM: 9.3 mg/dL (ref 8.0–10.3)
CO2: 25 meq/L (ref 18–33)
Chloride: 102 mEq/L (ref 98–108)
Creat: 1.1 mg/dl (ref 0.6–1.2)
GLUCOSE: 102 mg/dL (ref 73–118)
POTASSIUM: 4 meq/L (ref 3.3–4.7)
Sodium: 134 mEq/L (ref 128–145)
Total Protein: 6.4 g/dL (ref 6.4–8.1)

## 2015-09-10 MED ORDER — SODIUM CHLORIDE 0.9 % IJ SOLN
10.0000 mL | INTRAMUSCULAR | Status: DC | PRN
  Administered 2015-09-10: 10 mL via INTRAVENOUS
  Filled 2015-09-10: qty 10

## 2015-09-10 MED ORDER — HEPARIN SOD (PORK) LOCK FLUSH 100 UNIT/ML IV SOLN
500.0000 [IU] | Freq: Once | INTRAVENOUS | Status: AC
  Administered 2015-09-10: 500 [IU] via INTRAVENOUS
  Filled 2015-09-10: qty 5

## 2015-09-10 MED ORDER — SODIUM CHLORIDE 0.9 % IV SOLN
INTRAVENOUS | Status: DC
  Administered 2015-09-10: 13:00:00 via INTRAVENOUS

## 2015-09-10 NOTE — Patient Instructions (Signed)
Dehydration, Adult Dehydration is when you lose more fluids from the body than you take in. Vital organs like the kidneys, brain, and heart cannot function without a proper amount of fluids and salt. Any loss of fluids from the body can cause dehydration.  CAUSES   Vomiting.  Diarrhea.  Excessive sweating.  Excessive urine output.  Fever. SYMPTOMS  Mild dehydration  Thirst.  Dry lips.  Slightly dry mouth. Moderate dehydration  Very dry mouth.  Sunken eyes.  Skin does not bounce back quickly when lightly pinched and released.  Dark urine and decreased urine production.  Decreased tear production.  Headache. Severe dehydration  Very dry mouth.  Extreme thirst.  Rapid, weak pulse (more than 100 beats per minute at rest).  Cold hands and feet.  Not able to sweat in spite of heat and temperature.  Rapid breathing.  Blue lips.  Confusion and lethargy.  Difficulty being awakened.  Minimal urine production.  No tears. DIAGNOSIS  Your caregiver will diagnose dehydration based on your symptoms and your exam. Blood and urine tests will help confirm the diagnosis. The diagnostic evaluation should also identify the cause of dehydration. TREATMENT  Treatment of mild or moderate dehydration can often be done at home by increasing the amount of fluids that you drink. It is best to drink small amounts of fluid more often. Drinking too much at one time can make vomiting worse. Refer to the home care instructions below. Severe dehydration needs to be treated at the hospital where you will probably be given intravenous (IV) fluids that contain water and electrolytes. HOME CARE INSTRUCTIONS   Ask your caregiver about specific rehydration instructions.  Drink enough fluids to keep your urine clear or pale yellow.  Drink small amounts frequently if you have nausea and vomiting.  Eat as you normally do.  Avoid:  Foods or drinks high in sugar.  Carbonated  drinks.  Juice.  Extremely hot or cold fluids.  Drinks with caffeine.  Fatty, greasy foods.  Alcohol.  Tobacco.  Overeating.  Gelatin desserts.  Wash your hands well to avoid spreading bacteria and viruses.  Only take over-the-counter or prescription medicines for pain, discomfort, or fever as directed by your caregiver.  Ask your caregiver if you should continue all prescribed and over-the-counter medicines.  Keep all follow-up appointments with your caregiver. SEEK MEDICAL CARE IF:  You have abdominal pain and it increases or stays in one area (localizes).  You have a rash, stiff neck, or severe headache.  You are irritable, sleepy, or difficult to awaken.  You are weak, dizzy, or extremely thirsty. SEEK IMMEDIATE MEDICAL CARE IF:   You are unable to keep fluids down or you get worse despite treatment.  You have frequent episodes of vomiting or diarrhea.  You have blood or green matter (bile) in your vomit.  You have blood in your stool or your stool looks black and tarry.  You have not urinated in 6 to 8 hours, or you have only urinated a small amount of very dark urine.  You have a fever.  You faint. MAKE SURE YOU:   Understand these instructions.  Will watch your condition.  Will get help right away if you are not doing well or get worse. Document Released: 12/13/2005 Document Revised: 03/06/2012 Document Reviewed: 08/02/2011 ExitCare Patient Information 2015 [employer]. This information is not intended to replace advice given to you by your health care provider. Make sure you discuss any questions you have with your health care   provider.  

## 2015-09-15 ENCOUNTER — Other Ambulatory Visit: Payer: Self-pay | Admitting: Hematology & Oncology

## 2015-09-17 ENCOUNTER — Ambulatory Visit (HOSPITAL_BASED_OUTPATIENT_CLINIC_OR_DEPARTMENT_OTHER): Payer: 59 | Admitting: Hematology & Oncology

## 2015-09-17 ENCOUNTER — Encounter: Payer: Self-pay | Admitting: Hematology & Oncology

## 2015-09-17 ENCOUNTER — Other Ambulatory Visit (HOSPITAL_BASED_OUTPATIENT_CLINIC_OR_DEPARTMENT_OTHER): Payer: 59

## 2015-09-17 ENCOUNTER — Ambulatory Visit (HOSPITAL_BASED_OUTPATIENT_CLINIC_OR_DEPARTMENT_OTHER): Payer: 59

## 2015-09-17 VITALS — BP 127/73 | HR 73 | Temp 97.2°F | Resp 18 | Ht 78.0 in | Wt 184.0 lb

## 2015-09-17 DIAGNOSIS — C159 Malignant neoplasm of esophagus, unspecified: Secondary | ICD-10-CM

## 2015-09-17 DIAGNOSIS — Z5111 Encounter for antineoplastic chemotherapy: Secondary | ICD-10-CM

## 2015-09-17 DIAGNOSIS — C155 Malignant neoplasm of lower third of esophagus: Secondary | ICD-10-CM

## 2015-09-17 DIAGNOSIS — K529 Noninfective gastroenteritis and colitis, unspecified: Secondary | ICD-10-CM

## 2015-09-17 LAB — CBC WITH DIFFERENTIAL (CANCER CENTER ONLY)
BASO#: 0 10*3/uL (ref 0.0–0.2)
BASO%: 0.4 % (ref 0.0–2.0)
EOS%: 2.1 % (ref 0.0–7.0)
Eosinophils Absolute: 0.2 10*3/uL (ref 0.0–0.5)
HCT: 35.2 % — ABNORMAL LOW (ref 38.7–49.9)
HGB: 11.4 g/dL — ABNORMAL LOW (ref 13.0–17.1)
LYMPH#: 2.3 10*3/uL (ref 0.9–3.3)
LYMPH%: 27.9 % (ref 14.0–48.0)
MCH: 33.1 pg (ref 28.0–33.4)
MCHC: 32.4 g/dL (ref 32.0–35.9)
MCV: 102 fL — AB (ref 82–98)
MONO#: 0.7 10*3/uL (ref 0.1–0.9)
MONO%: 8.6 % (ref 0.0–13.0)
NEUT#: 5.1 10*3/uL (ref 1.5–6.5)
NEUT%: 61 % (ref 40.0–80.0)
PLATELETS: 175 10*3/uL (ref 145–400)
RBC: 3.44 10*6/uL — ABNORMAL LOW (ref 4.20–5.70)
RDW: 16.2 % — AB (ref 11.1–15.7)
WBC: 8.3 10*3/uL (ref 4.0–10.0)

## 2015-09-17 LAB — BASIC METABOLIC PANEL - CANCER CENTER ONLY
BUN, Bld: 13 mg/dL (ref 7–22)
CALCIUM: 8.6 mg/dL (ref 8.0–10.3)
CHLORIDE: 104 meq/L (ref 98–108)
CO2: 24 mEq/L (ref 18–33)
CREATININE: 1.1 mg/dL (ref 0.6–1.2)
Glucose, Bld: 97 mg/dL (ref 73–118)
Potassium: 3.9 mEq/L (ref 3.3–4.7)
SODIUM: 137 meq/L (ref 128–145)

## 2015-09-17 MED ORDER — SODIUM CHLORIDE 0.9 % IV SOLN
Freq: Once | INTRAVENOUS | Status: AC
  Administered 2015-09-17: 10:00:00 via INTRAVENOUS

## 2015-09-17 MED ORDER — LEUCOVORIN CALCIUM INJECTION 350 MG
400.0000 mg/m2 | Freq: Once | INTRAVENOUS | Status: AC
  Administered 2015-09-17: 900 mg via INTRAVENOUS
  Filled 2015-09-17: qty 45

## 2015-09-17 MED ORDER — ATROPINE SULFATE 1 MG/ML IJ SOLN
0.5000 mg | Freq: Once | INTRAMUSCULAR | Status: AC | PRN
  Administered 2015-09-17: 0.5 mg via INTRAVENOUS

## 2015-09-17 MED ORDER — DEXAMETHASONE SODIUM PHOSPHATE 100 MG/10ML IJ SOLN
Freq: Once | INTRAMUSCULAR | Status: AC
  Administered 2015-09-17: 10:00:00 via INTRAVENOUS
  Filled 2015-09-17: qty 8

## 2015-09-17 MED ORDER — HEPARIN SOD (PORK) LOCK FLUSH 100 UNIT/ML IV SOLN
500.0000 [IU] | Freq: Once | INTRAVENOUS | Status: DC | PRN
  Filled 2015-09-17: qty 5

## 2015-09-17 MED ORDER — SODIUM CHLORIDE 0.9 % IV SOLN
1980.0000 mg/m2 | INTRAVENOUS | Status: DC
  Administered 2015-09-17: 4450 mg via INTRAVENOUS
  Filled 2015-09-17: qty 89

## 2015-09-17 MED ORDER — FLUOROURACIL CHEMO INJECTION 2.5 GM/50ML
400.0000 mg/m2 | Freq: Once | INTRAVENOUS | Status: AC
Start: 2015-09-17 — End: 2015-09-17
  Administered 2015-09-17: 900 mg via INTRAVENOUS
  Filled 2015-09-17: qty 18

## 2015-09-17 MED ORDER — ATROPINE SULFATE 1 MG/ML IJ SOLN
INTRAMUSCULAR | Status: AC
  Filled 2015-09-17: qty 1

## 2015-09-17 MED ORDER — SODIUM CHLORIDE 0.9 % IJ SOLN
10.0000 mL | INTRAMUSCULAR | Status: DC | PRN
  Filled 2015-09-17: qty 10

## 2015-09-17 MED ORDER — IRINOTECAN HCL CHEMO INJECTION 100 MG/5ML
300.0000 mg | Freq: Once | INTRAVENOUS | Status: AC
  Administered 2015-09-17: 300 mg via INTRAVENOUS
  Filled 2015-09-17: qty 5

## 2015-09-17 NOTE — Progress Notes (Signed)
Hematology and Oncology Follow Up Visit  Willie Owens [id number] [birth year] 64 y.o. 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 He is looking pretty good. He is swallowing okay. His wife says that he is just not eating all that much. He is not drinking much. Her lipase was reportedly very well as cirrhosis today for some reason that she is suggested to [facility] she is late atrial fibrillation.  His wife, unfortunately, fell and broke her right wrist. She is in a cast for about a month.  His mother-in-law passed away recently. I took care of her. Unfortunately, there is a lot of issues with the family now. This is causing a lot of stress with his wife.  We went ahead and repeated a PET scan on. Thankfully, the PET scan showed that he was responding. Head decreased in the size and number of pulmonary metastasis. Some of his lymphadenopathy resolved.  He is not having any problems with pain. He's having no problems with nausea or vomiting. He's still having some diarrhea. His wife is trying her best to try to help with that.  Overall, his performance status is ECOG 1.   Medications:  Current outpatient prescriptions:  .  aspirin 81 MG chewable tablet, Chew 1 tablet (81 mg total) by mouth daily., Disp: , Rfl:  .  B Complex-C (SUPER B COMPLEX PO), Take 1 tablet by mouth daily., Disp: , Rfl:  .  benzonatate (TESSALON PERLES) 100 MG capsule, Take 1 capsule (100 mg total) by mouth 3 (three) times daily as needed for cough., Disp: 90 capsule, Rfl: 0 .  bismuth subsalicylate (PEPTO BISMOL) 262 MG/15ML suspension, Take 30 mLs by mouth every 6 (six) hours as needed for diarrhea or loose stools., Disp: , Rfl:  .  cetirizine (ZYRTEC) 10 MG tablet, Take 10 mg by mouth daily as needed for allergies. , Disp: , Rfl:  .   Cholecalciferol (VITAMIN D) 2000 UNITS tablet, Take 2,000 Units by mouth daily., Disp: , Rfl:  .  diphenhydrAMINE (BENADRYL) 25 MG tablet, Take 25 mg by mouth every 6 (six) hours as needed for allergies., Disp: , Rfl:  .  diphenoxylate-atropine (LOMOTIL) 2.5-0.025 MG per tablet, TAKE 1 TABLET BY MOUTH FOUR TIMES DAILY AS NEEDED FOR DIARRHEA, Disp: 60 tablet, Rfl: 2 .  dronabinol (MARINOL) 5 MG capsule, Take 1 capsule (5 mg total) by mouth 2 (two) times daily before a meal., Disp: 60 capsule, Rfl: 0 .  lidocaine-prilocaine (EMLA) cream, Apply 1 application topically as needed. (Patient taking differently: Apply 1 application topically as needed (numbing port every 2 weeks). ), Disp: 30 g, Rfl: 10 .  lipase/protease/amylase (CREON) 36000 UNITS CPEP capsule, Take 1 capsule (36,000 Units total) by mouth 3 (three) times daily before meals., Disp: 90 capsule, Rfl: 4 .  loperamide (IMODIUM A-D) 2 MG tablet, Take 2 at onset of diarrhea, then 1 every 2hrs until 12hr without a BM. May take 2 tab every 4hrs at bedtime. If diarrhea recurs repeat., Disp: 100 tablet, Rfl: 1 .  MAGNESIUM-OXIDE 400 (241.3 MG) MG tablet, TK 1 T PO BID, Disp: , Rfl: 6 .  Multiple Vitamin (MULTIVITAMIN WITH MINERALS) TABS tablet, Take 1 tablet by mouth daily., Disp: , Rfl:  .  omeprazole (PRILOSEC) 40 MG capsule, Take 1 capsule (40 mg total) by mouth daily.,  Disp: 30 capsule, Rfl: 6 .  ondansetron (ZOFRAN ODT) 4 MG disintegrating tablet, Take 1 tablet (4 mg total) by mouth every 8 (eight) hours as needed for nausea or vomiting., Disp: 20 tablet, Rfl: 0 .  ondansetron (ZOFRAN) 8 MG tablet, Take 1 tablet (8 mg total) by mouth 2 (two) times daily. Start the day after chemo for 3 days. Then take as needed for nausea or vomiting., Disp: 30 tablet, Rfl: 1 .  pegfilgrastim (NEULASTA) 6 MG/0.6ML injection, Inject 6 mg into the skin once., Disp: , Rfl:  .  potassium chloride SA (K-DUR,KLOR-CON) 20 MEQ tablet, Take 2 tablets (40 mEq total) by mouth  daily., Disp: 60 tablet, Rfl: 4 .  PRESCRIPTION MEDICATION, Chemo - [facility], Disp: , Rfl:  .  Probiotic Product (ALIGN) 4 MG CAPS, Take 1 capsule by mouth daily with breakfast. , Disp: , Rfl:  .  prochlorperazine (COMPAZINE) 10 MG tablet, Take 10 mg by mouth every 6 (six) hours as needed for nausea or vomiting. , Disp: , Rfl:  .  propranolol ER (INDERAL LA) 60 MG 24 hr capsule, TAKE 1 CAPSULE BY MOUTH AT BEDTIME, Disp: 30 capsule, Rfl: 0 .  pyridOXINE (VITAMIN B-6) 100 MG tablet, Take 2 pills at one time once a day. (Patient taking differently: Take 200 mg by mouth daily. ), Disp: 60 tablet, Rfl: 6 .  traZODone (DESYREL) 50 MG tablet, Take 1 tablet (50 mg total) by mouth at bedtime. (Patient taking differently: Take 50 mg by mouth at bedtime as needed for sleep. ), Disp: 30 tablet, Rfl: 3 .  venlafaxine (EFFEXOR) 37.5 MG tablet, TAKE 1 TABLET BY MOUTH TWICE DAILY WITH A MEAL, Disp: 60 tablet, Rfl: 0  Allergies:  Allergies  Allergen Reactions  . Penicillins Other (See Comments)    Hallucinations, from childhood    Past Medical History, Surgical history, Social history, and Family History were reviewed and updated.  Review of Systems: As above  Physical Exam:  height is 6' 6"  (1.981 m) and weight is 184 lb (83.462 kg). His oral temperature is 97.2 F (36.2 C). His blood pressure is 127/73 and his pulse is 73. His respiration is 18.   Wt Readings from Last 3 Encounters:  09/17/15 184 lb (83.462 kg)  09/03/15 185 lb (83.915 kg)  08/08/15 186 lb 4.6 oz (84.5 kg)     Well-developed and well-nourished white gym in no obvious distress. Head and neck exam shows no ocular or oral lesions. There are no palpable cervical or supraclavicular lymph nodes. Lungs are clear. Cardiac exam regular rate and rhythm with no murmurs, rubs or bruits. Abdomen is soft. There is no palpable abdominal mass. There is no fluid wave. There is no guarding or rebound tenderness. There is no palpable liver or spleen tip.  Back exam shows no tenderness over the spine, ribs or hips. Extremities shows no clubbing, cyanosis or edema. He has good strength in his joints. He has good range of motion of his joints. Neurological exam is nonfocal. Skin exam no rashes, ecchymoses or petechia.  Lab Results  Component Value Date   WBC 7.4 09/10/2015   HGB 10.7* 09/10/2015   HCT 32.7* 09/10/2015   MCV 100* 09/10/2015   PLT 165 09/10/2015     Chemistry      Component Value Date/Time   NA 134 09/10/2015 1206   NA 133* 08/18/2015 1550   K 4.0 09/10/2015 1206   K 4.6 08/18/2015 1550   CL 102 09/10/2015 1206  CL 101 08/18/2015 1550   CO2 25 09/10/2015 1206   CO2 24 08/18/2015 1550   BUN 15 09/10/2015 1206   BUN 14 08/18/2015 1550   CREATININE 1.1 09/10/2015 1206   CREATININE 1.32* 08/18/2015 1550      Component Value Date/Time   CALCIUM 9.3 09/10/2015 1206   CALCIUM 8.3* 08/18/2015 1550   ALKPHOS 84 09/10/2015 1206   ALKPHOS 41 08/18/2015 1550   AST 20 09/10/2015 1206   AST 22 08/18/2015 1550   ALT 12 09/10/2015 1206   ALT 14* 08/18/2015 1550   BILITOT 1.30 09/10/2015 1206   BILITOT 0.5 08/18/2015 1550         Impression and Plan: Mr. Hannen is 64 year old gentleman. He has metastatic adenocarcinoma of the distal esophagus.  I'm glad that he is responding. As such, we will continue him on therapy.  I think we can probably have him come in weekly for IV fluids. I think this would help him with any dehydration. We'll also check his potassium and make sure that this is not going low.  We will plan to see him back in 2 more weeks.  I will do 4 more cycles and then repeat his PET scan.      [name], MD 9/21/20168:44 AM

## 2015-09-17 NOTE — Patient Instructions (Signed)
[facility] Discharge Instructions for Patients Receiving Chemotherapy  Today you received the following chemotherapy agents Irinotecan, 5FU, Leucovorin  To help prevent nausea and vomiting after your treatment, we encourage you to take your nausea medication    If you develop nausea and vomiting that is not controlled by your nausea medication, call the clinic.   BELOW ARE SYMPTOMS THAT SHOULD BE REPORTED IMMEDIATELY:  *FEVER GREATER THAN 100.5 F  *CHILLS WITH OR WITHOUT FEVER  NAUSEA AND VOMITING THAT IS NOT CONTROLLED WITH YOUR NAUSEA MEDICATION  *UNUSUAL SHORTNESS OF BREATH  *UNUSUAL BRUISING OR BLEEDING  TENDERNESS IN MOUTH AND THROAT WITH OR WITHOUT PRESENCE OF ULCERS  *URINARY PROBLEMS  *BOWEL PROBLEMS  UNUSUAL RASH Items with * indicate a potential emergency and should be followed up as soon as possible.  Feel free to call the clinic you have any questions or concerns. The clinic phone number is [phone].  Please show the [place] at check-in to the Emergency Department and triage nurse.

## 2015-09-19 ENCOUNTER — Ambulatory Visit (HOSPITAL_BASED_OUTPATIENT_CLINIC_OR_DEPARTMENT_OTHER): Payer: 59

## 2015-09-19 VITALS — BP 126/80 | HR 69 | Temp 97.9°F | Resp 18

## 2015-09-19 DIAGNOSIS — C159 Malignant neoplasm of esophagus, unspecified: Secondary | ICD-10-CM

## 2015-09-19 DIAGNOSIS — Z5189 Encounter for other specified aftercare: Secondary | ICD-10-CM | POA: Diagnosis not present

## 2015-09-19 DIAGNOSIS — C155 Malignant neoplasm of lower third of esophagus: Secondary | ICD-10-CM | POA: Diagnosis not present

## 2015-09-19 MED ORDER — PEGFILGRASTIM INJECTION 6 MG/0.6ML ~~LOC~~
6.0000 mg | PREFILLED_SYRINGE | Freq: Once | SUBCUTANEOUS | Status: AC
  Administered 2015-09-19: 6 mg via SUBCUTANEOUS

## 2015-09-19 MED ORDER — HEPARIN SOD (PORK) LOCK FLUSH 100 UNIT/ML IV SOLN
500.0000 [IU] | Freq: Once | INTRAVENOUS | Status: AC | PRN
  Administered 2015-09-19: 500 [IU]
  Filled 2015-09-19: qty 5

## 2015-09-19 MED ORDER — SODIUM CHLORIDE 0.9 % IJ SOLN
10.0000 mL | INTRAMUSCULAR | Status: DC | PRN
  Administered 2015-09-19: 10 mL
  Filled 2015-09-19: qty 10

## 2015-09-19 NOTE — Patient Instructions (Signed)
Pegfilgrastim injection What is this medicine? PEGFILGRASTIM (peg fil GRA stim) is a long-acting granulocyte colony-stimulating factor that stimulates the growth of neutrophils, a type of white blood cell important in the body's fight against infection. It is used to reduce the incidence of fever and infection in patients with certain types of cancer who are receiving chemotherapy that affects the bone marrow. This medicine may be used for other purposes; ask your health care provider or pharmacist if you have questions. COMMON BRAND NAME(S): Neulasta What should I tell my health care provider before I take this medicine? They need to know if you have any of these conditions: -latex allergy -ongoing radiation therapy -sickle cell disease -skin reactions to acrylic adhesives (On-Body Injector only) -an unusual or allergic reaction to pegfilgrastim, filgrastim, other medicines, foods, dyes, or preservatives -pregnant or trying to get pregnant -breast-feeding How should I use this medicine? This medicine is for injection under the skin. If you get this medicine at home, you will be taught how to prepare and give the pre-filled syringe or how to use the On-body Injector. Refer to the patient Instructions for Use for detailed instructions. Use exactly as directed. Take your medicine at regular intervals. Do not take your medicine more often than directed. It is important that you put your used needles and syringes in a special sharps container. Do not put them in a trash can. If you do not have a sharps container, call your pharmacist or healthcare provider to get one. Talk to your pediatrician regarding the use of this medicine in children. Special care may be needed. Overdosage: If you think you have taken too much of this medicine contact a poison control center or emergency room at once. NOTE: This medicine is only for you. Do not share this medicine with others. What if I miss a dose? It is  important not to miss your dose. Call your doctor or health care professional if you miss your dose. If you miss a dose due to an On-body Injector failure or leakage, a new dose should be administered as soon as possible using a single prefilled syringe for manual use. What may interact with this medicine? Interactions have not been studied. Give your health care provider a list of all the medicines, herbs, non-prescription drugs, or dietary supplements you use. Also tell them if you smoke, drink alcohol, or use illegal drugs. Some items may interact with your medicine. This list may not describe all possible interactions. Give your health care provider a list of all the medicines, herbs, non-prescription drugs, or dietary supplements you use. Also tell them if you smoke, drink alcohol, or use illegal drugs. Some items may interact with your medicine. What should I watch for while using this medicine? You may need blood work done while you are taking this medicine. If you are going to need a MRI, CT scan, or other procedure, tell your doctor that you are using this medicine (On-Body Injector only). What side effects may I notice from receiving this medicine? Side effects that you should report to your doctor or health care professional as soon as possible: -allergic reactions like skin rash, itching or hives, swelling of the face, lips, or tongue -dizziness -fever -pain, redness, or irritation at site where injected -pinpoint red spots on the skin -shortness of breath or breathing problems -stomach or side pain, or pain at the shoulder -swelling -tiredness -trouble passing urine Side effects that usually do not require medical attention (report to your doctor   or health care professional if they continue or are bothersome): -bone pain -muscle pain This list may not describe all possible side effects. Call your doctor for medical advice about side effects. You may report side effects to FDA at  1-800-FDA-1088. Where should I keep my medicine? Keep out of the reach of children. Store pre-filled syringes in a refrigerator between 2 and 8 degrees C (36 and 46 degrees F). Do not freeze. Keep in carton to protect from light. Throw away this medicine if it is left out of the refrigerator for more than 48 hours. Throw away any unused medicine after the expiration date. NOTE: This sheet is a summary. It may not cover all possible information. If you have questions about this medicine, talk to your doctor, pharmacist, or health care provider.  2015, [employer] Standard. (2014-03-14 16:14:05)  

## 2015-09-22 ENCOUNTER — Ambulatory Visit: Payer: 59

## 2015-09-22 ENCOUNTER — Other Ambulatory Visit: Payer: 59

## 2015-09-24 ENCOUNTER — Other Ambulatory Visit (HOSPITAL_BASED_OUTPATIENT_CLINIC_OR_DEPARTMENT_OTHER): Payer: 59

## 2015-09-24 ENCOUNTER — Ambulatory Visit (HOSPITAL_BASED_OUTPATIENT_CLINIC_OR_DEPARTMENT_OTHER): Payer: 59

## 2015-09-24 VITALS — BP 113/74 | HR 72 | Temp 98.2°F | Resp 18

## 2015-09-24 DIAGNOSIS — C159 Malignant neoplasm of esophagus, unspecified: Secondary | ICD-10-CM

## 2015-09-24 DIAGNOSIS — C155 Malignant neoplasm of lower third of esophagus: Secondary | ICD-10-CM | POA: Diagnosis not present

## 2015-09-24 DIAGNOSIS — E860 Dehydration: Secondary | ICD-10-CM | POA: Diagnosis not present

## 2015-09-24 LAB — CBC WITH DIFFERENTIAL (CANCER CENTER ONLY)
BASO#: 0 10*3/uL (ref 0.0–0.2)
BASO%: 0.2 % (ref 0.0–2.0)
EOS%: 1 % (ref 0.0–7.0)
Eosinophils Absolute: 0 10*3/uL (ref 0.0–0.5)
HCT: 33.7 % — ABNORMAL LOW (ref 38.7–49.9)
HEMOGLOBIN: 11.1 g/dL — AB (ref 13.0–17.1)
LYMPH#: 1.4 10*3/uL (ref 0.9–3.3)
LYMPH%: 33.7 % (ref 14.0–48.0)
MCH: 32.7 pg (ref 28.0–33.4)
MCHC: 32.9 g/dL (ref 32.0–35.9)
MCV: 99 fL — ABNORMAL HIGH (ref 82–98)
MONO#: 0.4 10*3/uL (ref 0.1–0.9)
MONO%: 10.7 % (ref 0.0–13.0)
NEUT%: 54.4 % (ref 40.0–80.0)
NEUTROS ABS: 2.2 10*3/uL (ref 1.5–6.5)
Platelets: 150 10*3/uL (ref 145–400)
RBC: 3.39 10*6/uL — AB (ref 4.20–5.70)
RDW: 15.7 % (ref 11.1–15.7)
WBC: 4.1 10*3/uL (ref 4.0–10.0)

## 2015-09-24 LAB — CMP (CANCER CENTER ONLY)
ALK PHOS: 109 U/L — AB (ref 26–84)
ALT: 15 U/L (ref 10–47)
AST: 24 U/L (ref 11–38)
Albumin: 3.6 g/dL (ref 3.3–5.5)
BILIRUBIN TOTAL: 1.5 mg/dL (ref 0.20–1.60)
BUN: 13 mg/dL (ref 7–22)
CHLORIDE: 100 meq/L (ref 98–108)
CO2: 26 mEq/L (ref 18–33)
CREATININE: 1.2 mg/dL (ref 0.6–1.2)
Calcium: 9.1 mg/dL (ref 8.0–10.3)
Glucose, Bld: 110 mg/dL (ref 73–118)
Potassium: 4.1 mEq/L (ref 3.3–4.7)
SODIUM: 132 meq/L (ref 128–145)
TOTAL PROTEIN: 6.3 g/dL — AB (ref 6.4–8.1)

## 2015-09-24 MED ORDER — SODIUM CHLORIDE 0.9 % IV SOLN
1000.0000 mL | Freq: Once | INTRAVENOUS | Status: AC
  Administered 2015-09-24: 1000 mL via INTRAVENOUS

## 2015-09-24 NOTE — Patient Instructions (Signed)
Dehydration, Adult Dehydration is when you lose more fluids from the body than you take in. Vital organs like the kidneys, brain, and heart cannot function without a proper amount of fluids and salt. Any loss of fluids from the body can cause dehydration.  CAUSES   Vomiting.  Diarrhea.  Excessive sweating.  Excessive urine output.  Fever. SYMPTOMS  Mild dehydration  Thirst.  Dry lips.  Slightly dry mouth. Moderate dehydration  Very dry mouth.  Sunken eyes.  Skin does not bounce back quickly when lightly pinched and released.  Dark urine and decreased urine production.  Decreased tear production.  Headache. Severe dehydration  Very dry mouth.  Extreme thirst.  Rapid, weak pulse (more than 100 beats per minute at rest).  Cold hands and feet.  Not able to sweat in spite of heat and temperature.  Rapid breathing.  Blue lips.  Confusion and lethargy.  Difficulty being awakened.  Minimal urine production.  No tears. DIAGNOSIS  Your caregiver will diagnose dehydration based on your symptoms and your exam. Blood and urine tests will help confirm the diagnosis. The diagnostic evaluation should also identify the cause of dehydration. TREATMENT  Treatment of mild or moderate dehydration can often be done at home by increasing the amount of fluids that you drink. It is best to drink small amounts of fluid more often. Drinking too much at one time can make vomiting worse. Refer to the home care instructions below. Severe dehydration needs to be treated at the hospital where you will probably be given intravenous (IV) fluids that contain water and electrolytes. HOME CARE INSTRUCTIONS   Ask your caregiver about specific rehydration instructions.  Drink enough fluids to keep your urine clear or pale yellow.  Drink small amounts frequently if you have nausea and vomiting.  Eat as you normally do.  Avoid:  Foods or drinks high in sugar.  Carbonated  drinks.  Juice.  Extremely hot or cold fluids.  Drinks with caffeine.  Fatty, greasy foods.  Alcohol.  Tobacco.  Overeating.  Gelatin desserts.  Wash your hands well to avoid spreading bacteria and viruses.  Only take over-the-counter or prescription medicines for pain, discomfort, or fever as directed by your caregiver.  Ask your caregiver if you should continue all prescribed and over-the-counter medicines.  Keep all follow-up appointments with your caregiver. SEEK MEDICAL CARE IF:  You have abdominal pain and it increases or stays in one area (localizes).  You have a rash, stiff neck, or severe headache.  You are irritable, sleepy, or difficult to awaken.  You are weak, dizzy, or extremely thirsty. SEEK IMMEDIATE MEDICAL CARE IF:   You are unable to keep fluids down or you get worse despite treatment.  You have frequent episodes of vomiting or diarrhea.  You have blood or green matter (bile) in your vomit.  You have blood in your stool or your stool looks black and tarry.  You have not urinated in 6 to 8 hours, or you have only urinated a small amount of very dark urine.  You have a fever.  You faint. MAKE SURE YOU:   Understand these instructions.  Will watch your condition.  Will get help right away if you are not doing well or get worse. Document Released: 12/13/2005 Document Revised: 03/06/2012 Document Reviewed: 08/02/2011 ExitCare Patient Information 2015 [employer]. This information is not intended to replace advice given to you by your health care provider. Make sure you discuss any questions you have with your health care   provider.  

## 2015-09-30 ENCOUNTER — Telehealth: Payer: Self-pay | Admitting: *Deleted

## 2015-09-30 NOTE — Telephone Encounter (Signed)
Patient's wife states patient is dehydrated and needs IVF. Appointment made for tomorrow afternoon at her request.

## 2015-10-01 ENCOUNTER — Ambulatory Visit (HOSPITAL_BASED_OUTPATIENT_CLINIC_OR_DEPARTMENT_OTHER): Payer: 59

## 2015-10-01 VITALS — BP 115/63 | HR 69 | Temp 98.2°F | Resp 18

## 2015-10-01 DIAGNOSIS — C155 Malignant neoplasm of lower third of esophagus: Secondary | ICD-10-CM | POA: Diagnosis not present

## 2015-10-01 DIAGNOSIS — C158 Malignant neoplasm of overlapping sites of esophagus: Secondary | ICD-10-CM

## 2015-10-01 MED ORDER — SODIUM CHLORIDE 0.9 % IV SOLN
Freq: Once | INTRAVENOUS | Status: AC
  Administered 2015-10-01: 14:00:00 via INTRAVENOUS

## 2015-10-01 NOTE — Patient Instructions (Signed)

## 2015-10-07 ENCOUNTER — Other Ambulatory Visit: Payer: Self-pay | Admitting: *Deleted

## 2015-10-07 DIAGNOSIS — C158 Malignant neoplasm of overlapping sites of esophagus: Secondary | ICD-10-CM

## 2015-10-08 ENCOUNTER — Ambulatory Visit (HOSPITAL_BASED_OUTPATIENT_CLINIC_OR_DEPARTMENT_OTHER): Payer: 59 | Admitting: Family

## 2015-10-08 ENCOUNTER — Other Ambulatory Visit (HOSPITAL_BASED_OUTPATIENT_CLINIC_OR_DEPARTMENT_OTHER): Payer: 59

## 2015-10-08 ENCOUNTER — Ambulatory Visit (HOSPITAL_BASED_OUTPATIENT_CLINIC_OR_DEPARTMENT_OTHER): Payer: 59

## 2015-10-08 ENCOUNTER — Encounter: Payer: Self-pay | Admitting: Family

## 2015-10-08 VITALS — BP 123/72 | HR 74 | Temp 97.5°F | Resp 16 | Wt 185.0 lb

## 2015-10-08 DIAGNOSIS — C155 Malignant neoplasm of lower third of esophagus: Secondary | ICD-10-CM

## 2015-10-08 DIAGNOSIS — Z5111 Encounter for antineoplastic chemotherapy: Secondary | ICD-10-CM

## 2015-10-08 DIAGNOSIS — C158 Malignant neoplasm of overlapping sites of esophagus: Secondary | ICD-10-CM

## 2015-10-08 LAB — CMP (CANCER CENTER ONLY)
ALK PHOS: 95 U/L — AB (ref 26–84)
ALT: 12 U/L (ref 10–47)
AST: 21 U/L (ref 11–38)
Albumin: 3 g/dL — ABNORMAL LOW (ref 3.3–5.5)
BILIRUBIN TOTAL: 0.6 mg/dL (ref 0.20–1.60)
BUN: 13 mg/dL (ref 7–22)
CO2: 25 mEq/L (ref 18–33)
Calcium: 9 mg/dL (ref 8.0–10.3)
Chloride: 102 mEq/L (ref 98–108)
Creat: 0.7 mg/dl (ref 0.6–1.2)
GLUCOSE: 103 mg/dL (ref 73–118)
POTASSIUM: 3.7 meq/L (ref 3.3–4.7)
Sodium: 135 mEq/L (ref 128–145)
TOTAL PROTEIN: 6.2 g/dL — AB (ref 6.4–8.1)

## 2015-10-08 LAB — CBC WITH DIFFERENTIAL (CANCER CENTER ONLY)
BASO#: 0.1 10*3/uL (ref 0.0–0.2)
BASO%: 1.1 % (ref 0.0–2.0)
EOS%: 3.3 % (ref 0.0–7.0)
Eosinophils Absolute: 0.3 10*3/uL (ref 0.0–0.5)
HCT: 34.7 % — ABNORMAL LOW (ref 38.7–49.9)
HGB: 11.4 g/dL — ABNORMAL LOW (ref 13.0–17.1)
LYMPH#: 2.1 10*3/uL (ref 0.9–3.3)
LYMPH%: 21.9 % (ref 14.0–48.0)
MCH: 33.1 pg (ref 28.0–33.4)
MCHC: 32.9 g/dL (ref 32.0–35.9)
MCV: 101 fL — AB (ref 82–98)
MONO#: 0.9 10*3/uL (ref 0.1–0.9)
MONO%: 9.6 % (ref 0.0–13.0)
NEUT#: 6.1 10*3/uL (ref 1.5–6.5)
NEUT%: 64.1 % (ref 40.0–80.0)
PLATELETS: 267 10*3/uL (ref 145–400)
RBC: 3.44 10*6/uL — AB (ref 4.20–5.70)
RDW: 17 % — AB (ref 11.1–15.7)
WBC: 9.4 10*3/uL (ref 4.0–10.0)

## 2015-10-08 MED ORDER — ATROPINE SULFATE 1 MG/ML IJ SOLN
INTRAMUSCULAR | Status: AC
  Filled 2015-10-08: qty 1

## 2015-10-08 MED ORDER — SODIUM CHLORIDE 0.9 % IV SOLN
Freq: Once | INTRAVENOUS | Status: AC
  Administered 2015-10-08: 10:00:00 via INTRAVENOUS

## 2015-10-08 MED ORDER — ATROPINE SULFATE 1 MG/ML IJ SOLN
0.5000 mg | Freq: Once | INTRAMUSCULAR | Status: AC | PRN
  Administered 2015-10-08: 0.5 mg via INTRAVENOUS

## 2015-10-08 MED ORDER — LEUCOVORIN CALCIUM INJECTION 350 MG
400.0000 mg/m2 | Freq: Once | INTRAVENOUS | Status: AC
  Administered 2015-10-08: 900 mg via INTRAVENOUS
  Filled 2015-10-08: qty 45

## 2015-10-08 MED ORDER — FLUOROURACIL CHEMO INJECTION 2.5 GM/50ML
400.0000 mg/m2 | Freq: Once | INTRAVENOUS | Status: AC
  Administered 2015-10-08: 900 mg via INTRAVENOUS
  Filled 2015-10-08: qty 18

## 2015-10-08 MED ORDER — SODIUM CHLORIDE 0.9 % IV SOLN
Freq: Once | INTRAVENOUS | Status: AC
  Administered 2015-10-08: 10:00:00 via INTRAVENOUS
  Filled 2015-10-08: qty 8

## 2015-10-08 MED ORDER — IRINOTECAN HCL CHEMO INJECTION 100 MG/5ML
133.0000 mg/m2 | Freq: Once | INTRAVENOUS | Status: AC
  Administered 2015-10-08: 300 mg via INTRAVENOUS
  Filled 2015-10-08: qty 15

## 2015-10-08 MED ORDER — SODIUM CHLORIDE 0.9 % IV SOLN
1980.0000 mg/m2 | INTRAVENOUS | Status: DC
  Administered 2015-10-08: 4450 mg via INTRAVENOUS
  Filled 2015-10-08: qty 89

## 2015-10-08 NOTE — Patient Instructions (Signed)
Fluorouracil, 5-FU injection What is this medicine? FLUOROURACIL, 5-FU (flure oh YOOR a sil) is a chemotherapy drug. It slows the growth of cancer cells. This medicine is used to treat many types of cancer like breast cancer, colon or rectal cancer, pancreatic cancer, and stomach cancer. This medicine may be used for other purposes; ask your health care provider or pharmacist if you have questions. What should I tell my health care provider before I take this medicine? They need to know if you have any of these conditions: -blood disorders -dihydropyrimidine dehydrogenase (DPD) deficiency -infection (especially a virus infection such as chickenpox, cold sores, or herpes) -kidney disease -liver disease -malnourished, poor nutrition -recent or ongoing radiation therapy -an unusual or allergic reaction to fluorouracil, other chemotherapy, other medicines, foods, dyes, or preservatives -pregnant or trying to get pregnant -breast-feeding How should I use this medicine? This drug is given as an infusion or injection into a vein. It is administered in a hospital or clinic by a specially trained health care professional. Talk to your pediatrician regarding the use of this medicine in children. Special care may be needed. Overdosage: If you think you have taken too much of this medicine contact a poison control center or emergency room at once. NOTE: This medicine is only for you. Do not share this medicine with others. What if I miss a dose? It is important not to miss your dose. Call your doctor or health care professional if you are unable to keep an appointment. What may interact with this medicine? -allopurinol -cimetidine -dapsone -digoxin -hydroxyurea -leucovorin -levamisole -medicines for seizures like ethotoin, fosphenytoin, phenytoin -medicines to increase blood counts like filgrastim, pegfilgrastim, sargramostim -medicines that treat or prevent blood clots like warfarin,  enoxaparin, and dalteparin -methotrexate -metronidazole -pyrimethamine -some other chemotherapy drugs like busulfan, cisplatin, estramustine, vinblastine -trimethoprim -trimetrexate -vaccines Talk to your doctor or health care professional before taking any of these medicines: -acetaminophen -aspirin -ibuprofen -ketoprofen -naproxen This list may not describe all possible interactions. Give your health care provider a list of all the medicines, herbs, non-prescription drugs, or dietary supplements you use. Also tell them if you smoke, drink alcohol, or use illegal drugs. Some items may interact with your medicine. What should I watch for while using this medicine? Visit your doctor for checks on your progress. This drug may make you feel generally unwell. This is not uncommon, as chemotherapy can affect healthy cells as well as cancer cells. Report any side effects. Continue your course of treatment even though you feel ill unless your doctor tells you to stop. In some cases, you may be given additional medicines to help with side effects. Follow all directions for their use. Call your doctor or health care professional for advice if you get a fever, chills or sore throat, or other symptoms of a cold or flu. Do not treat yourself. This drug decreases your body's ability to fight infections. Try to avoid being around people who are sick. This medicine may increase your risk to bruise or bleed. Call your doctor or health care professional if you notice any unusual bleeding. Be careful brushing and flossing your teeth or using a toothpick because you may get an infection or bleed more easily. If you have any dental work done, tell your dentist you are receiving this medicine. Avoid taking products that contain aspirin, acetaminophen, ibuprofen, naproxen, or ketoprofen unless instructed by your doctor. These medicines may hide a fever. Do not become pregnant while taking this medicine. Women should    inform their doctor if they wish to become pregnant or think they might be pregnant. There is a potential for serious side effects to an unborn child. Talk to your health care professional or pharmacist for more information. Do not breast-feed an infant while taking this medicine. Men should inform their doctor if they wish to father a child. This medicine may lower sperm counts. Do not treat diarrhea with over the counter products. Contact your doctor if you have diarrhea that lasts more than 2 days or if it is severe and watery. This medicine can make you more sensitive to the sun. Keep out of the sun. If you cannot avoid being in the sun, wear protective clothing and use sunscreen. Do not use sun lamps or tanning beds/booths. What side effects may I notice from receiving this medicine? Side effects that you should report to your doctor or health care professional as soon as possible: -allergic reactions like skin rash, itching or hives, swelling of the face, lips, or tongue -low blood counts - this medicine may decrease the number of white blood cells, red blood cells and platelets. You may be at increased risk for infections and bleeding. -signs of infection - fever or chills, cough, sore throat, pain or difficulty passing urine -signs of decreased platelets or bleeding - bruising, pinpoint red spots on the skin, black, tarry stools, blood in the urine -signs of decreased red blood cells - unusually weak or tired, fainting spells, lightheadedness -breathing problems -changes in vision -chest pain -mouth sores -nausea and vomiting -pain, swelling, redness at site where injected -pain, tingling, numbness in the hands or feet -redness, swelling, or sores on hands or feet -stomach pain -unusual bleeding Side effects that usually do not require medical attention (report to your doctor or health care professional if they continue or are bothersome): -changes in finger or toe  nails -diarrhea -dry or itchy skin -hair loss -headache -loss of appetite -sensitivity of eyes to the light -stomach upset -unusually teary eyes This list may not describe all possible side effects. Call your doctor for medical advice about side effects. You may report side effects to FDA at 1-800-FDA-1088. Where should I keep my medicine? This drug is given in a hospital or clinic and will not be stored at home. NOTE: This sheet is a summary. It may not cover all possible information. If you have questions about this medicine, talk to your doctor, pharmacist, or health care provider.    2016, Elsevier/Gold Standard. (2008-04-17 13:53:16) Irinotecan injection What is this medicine? IRINOTECAN (ir in oh TEE kan ) is a chemotherapy drug. It is used to treat colon and rectal cancer. This medicine may be used for other purposes; ask your health care provider or pharmacist if you have questions. What should I tell my health care provider before I take this medicine? They need to know if you have any of these conditions: -blood disorders -dehydration -diarrhea -infection (especially a virus infection such as chickenpox, cold sores, or herpes) -liver disease -low blood counts, like low white cell, platelet, or red cell counts -recent or ongoing radiation therapy -an unusual or allergic reaction to irinotecan, sorbitol, other chemotherapy, other medicines, foods, dyes, or preservatives -pregnant or trying to get pregnant -breast-feeding How should I use this medicine? This drug is given as an infusion into a vein. It is administered in a hospital or clinic by a specially trained health care professional. Talk to your pediatrician regarding the use of this medicine in children. Special  care may be needed. Overdosage: If you think you have taken too much of this medicine contact a poison control center or emergency room at once. NOTE: This medicine is only for you. Do not share this  medicine with others. What if I miss a dose? It is important not to miss your dose. Call your doctor or health care professional if you are unable to keep an appointment. What may interact with this medicine? Do not take this medicine with any of the following medications: -atazanavir -certain medicines for fungal infections like itraconazole and ketoconazole -St. John's Wort This medicine may also interact with the following medications: -dexamethasone -diuretics -laxatives -medicines for seizures like carbamazepine, mephobarbital, phenobarbital, phenytoin, primidone -medicines to increase blood counts like filgrastim, pegfilgrastim, sargramostim -prochlorperazine -vaccines This list may not describe all possible interactions. Give your health care provider a list of all the medicines, herbs, non-prescription drugs, or dietary supplements you use. Also tell them if you smoke, drink alcohol, or use illegal drugs. Some items may interact with your medicine. What should I watch for while using this medicine? Your condition will be monitored carefully while you are receiving this medicine. You will need important blood work done while you are taking this medicine. This drug may make you feel generally unwell. This is not uncommon, as chemotherapy can affect healthy cells as well as cancer cells. Report any side effects. Continue your course of treatment even though you feel ill unless your doctor tells you to stop. In some cases, you may be given additional medicines to help with side effects. Follow all directions for their use. You may get drowsy or dizzy. Do not drive, use machinery, or do anything that needs mental alertness until you know how this medicine affects you. Do not stand or sit up quickly, especially if you are an older patient. This reduces the risk of dizzy or fainting spells. Call your doctor or health care professional for advice if you get a fever, chills or sore throat, or  other symptoms of a cold or flu. Do not treat yourself. This drug decreases your body's ability to fight infections. Try to avoid being around people who are sick. This medicine may increase your risk to bruise or bleed. Call your doctor or health care professional if you notice any unusual bleeding. Be careful brushing and flossing your teeth or using a toothpick because you may get an infection or bleed more easily. If you have any dental work done, tell your dentist you are receiving this medicine. Avoid taking products that contain aspirin, acetaminophen, ibuprofen, naproxen, or ketoprofen unless instructed by your doctor. These medicines may hide a fever. Do not become pregnant while taking this medicine. Women should inform their doctor if they wish to become pregnant or think they might be pregnant. There is a potential for serious side effects to an unborn child. Talk to your health care professional or pharmacist for more information. Do not breast-feed an infant while taking this medicine. What side effects may I notice from receiving this medicine? Side effects that you should report to your doctor or health care professional as soon as possible: -allergic reactions like skin rash, itching or hives, swelling of the face, lips, or tongue -low blood counts - this medicine may decrease the number of white blood cells, red blood cells and platelets. You may be at increased risk for infections and bleeding. -signs of infection - fever or chills, cough, sore throat, pain or difficulty passing urine -signs of  decreased platelets or bleeding - bruising, pinpoint red spots on the skin, black, tarry stools, blood in the urine -signs of decreased red blood cells - unusually weak or tired, fainting spells, lightheadedness -breathing problems -chest pain -diarrhea -feeling faint or lightheaded, falls -flushing, runny nose, sweating during infusion -mouth sores or pain -pain, swelling, redness or  irritation where injected -pain, swelling, warmth in the leg -pain, tingling, numbness in the hands or feet -problems with balance, talking, walking -stomach cramps, pain -trouble passing urine or change in the amount of urine -vomiting as to be unable to hold down drinks or food -yellowing of the eyes or skin Side effects that usually do not require medical attention (report to your doctor or health care professional if they continue or are bothersome): -constipation -hair loss -headache -loss of appetite -nausea, vomiting -stomach upset This list may not describe all possible side effects. Call your doctor for medical advice about side effects. You may report side effects to FDA at 1-800-FDA-1088. Where should I keep my medicine? This drug is given in a hospital or clinic and will not be stored at home. NOTE: This sheet is a summary. It may not cover all possible information. If you have questions about this medicine, talk to your doctor, pharmacist, or health care provider.    2016, Elsevier/Gold Standard. (2013-06-11 16:29:32) Leucovorin injection What is this medicine? LEUCOVORIN (loo koe VOR in) is used to prevent or treat the harmful effects of some medicines. This medicine is used to treat anemia caused by a low amount of folic acid in the body. It is also used with 5-fluorouracil (5-FU) to treat colon cancer. This medicine may be used for other purposes; ask your health care provider or pharmacist if you have questions. What should I tell my health care provider before I take this medicine? They need to know if you have any of these conditions: -anemia from low levels of vitamin B-12 in the blood -an unusual or allergic reaction to leucovorin, folic acid, other medicines, foods, dyes, or preservatives -pregnant or trying to get pregnant -breast-feeding How should I use this medicine? This medicine is for injection into a muscle or into a vein. It is given by a health care  professional in a hospital or clinic setting. Talk to your pediatrician regarding the use of this medicine in children. Special care may be needed. Overdosage: If you think you have taken too much of this medicine contact a poison control center or emergency room at once. NOTE: This medicine is only for you. Do not share this medicine with others. What if I miss a dose? This does not apply. What may interact with this medicine? -capecitabine -fluorouracil -phenobarbital -phenytoin -primidone -trimethoprim-sulfamethoxazole This list may not describe all possible interactions. Give your health care provider a list of all the medicines, herbs, non-prescription drugs, or dietary supplements you use. Also tell them if you smoke, drink alcohol, or use illegal drugs. Some items may interact with your medicine. What should I watch for while using this medicine? Your condition will be monitored carefully while you are receiving this medicine. This medicine may increase the side effects of 5-fluorouracil, 5-FU. Tell your doctor or health care professional if you have diarrhea or mouth sores that do not get better or that get worse. What side effects may I notice from receiving this medicine? Side effects that you should report to your doctor or health care professional as soon as possible: -allergic reactions like skin rash, itching or hives,  swelling of the face, lips, or tongue -breathing problems -fever, infection -mouth sores -unusual bleeding or bruising -unusually weak or tired Side effects that usually do not require medical attention (report to your doctor or health care professional if they continue or are bothersome): -constipation or diarrhea -loss of appetite -nausea, vomiting This list may not describe all possible side effects. Call your doctor for medical advice about side effects. You may report side effects to FDA at 1-800-FDA-1088. Where should I keep my medicine? This drug is  given in a hospital or clinic and will not be stored at home. NOTE: This sheet is a summary. It may not cover all possible information. If you have questions about this medicine, talk to your doctor, pharmacist, or health care provider.    2016, Elsevier/Gold Standard. (2008-06-18 16:50:29)

## 2015-10-08 NOTE — Progress Notes (Signed)
Hematology and Oncology Follow Up Visit  VIAAN KNIPPENBERG [id number] [birth year] 64 y.o. 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 Aikey is here today with his wife for follow-up and cycle 8 of treatment. He is doing well. He is eating fairly well and has had no problems swallowing. He is drinking some fluids but states that he could do better. His weight is stable at this time.  He has no c/o pain.  PET scan in August showed an interval response to therapy.  His balance has improved. He has not been stumbling or having dizziness. No recent falls.  No swelling, tenderness, numbness or tingling in his extremities. No lymphadenopathy found on exam.  No issue with infections. No fever, chills, n/v, cough, rash, SOB, chest pain, palpitations or abominal pain.  He had diarrhea over the weekend but was able to stop it with imodium before becoming dehydrated.   Medications:    Medication List       This list is accurate as of: 10/08/15 10:01 AM.  Always use your most recent med list.               ALIGN 4 MG Caps  Take 1 capsule by mouth daily with breakfast.     aspirin 81 MG chewable tablet  Chew 1 tablet (81 mg total) by mouth daily.     benzonatate 100 MG capsule  Commonly known as:  TESSALON PERLES  Take 1 capsule (100 mg total) by mouth 3 (three) times daily as needed for cough.     bismuth subsalicylate 401 UU/72ZD suspension  Commonly known as:  PEPTO BISMOL  Take 30 mLs by mouth every 6 (six) hours as needed for diarrhea or loose stools.     cetirizine 10 MG tablet  Commonly known as:  ZYRTEC  Take 10 mg by mouth daily as needed for allergies.     diphenhydrAMINE 25 MG tablet  Commonly known as:  BENADRYL  Take 25 mg by mouth every 6 (six) hours as needed for allergies.     diphenoxylate-atropine 2.5-0.025 MG tablet    Commonly known as:  LOMOTIL  TAKE 1 TABLET BY MOUTH FOUR TIMES DAILY AS NEEDED FOR DIARRHEA     dronabinol 5 MG capsule  Commonly known as:  MARINOL  Take 1 capsule (5 mg total) by mouth 2 (two) times daily before a meal.     lidocaine-prilocaine cream  Commonly known as:  EMLA  Apply 1 application topically as needed.     lipase/protease/amylase 36000 UNITS Cpep capsule  Commonly known as:  CREON  Take 1 capsule (36,000 Units total) by mouth 3 (three) times daily before meals.     loperamide 2 MG tablet  Commonly known as:  IMODIUM A-D  Take 2 at onset of diarrhea, then 1 every 2hrs until 12hr without a BM. May take 2 tab every 4hrs at bedtime. If diarrhea recurs repeat.     MAGNESIUM-OXIDE 400 (241.3 MG) MG tablet  Generic drug:  magnesium oxide  TK 1 T PO BID     multivitamin with minerals Tabs tablet  Take 1 tablet by mouth daily.     omeprazole 40 MG capsule  Commonly known as:  PRILOSEC  Take 1 capsule (40 mg total) by mouth daily.     ondansetron 4 MG disintegrating tablet  Commonly known as:  ZOFRAN ODT  Take 1 tablet (4 mg total) by mouth every 8 (eight) hours as needed for nausea or vomiting.     ondansetron 8 MG tablet  Commonly known as:  ZOFRAN  Take 1 tablet (8 mg total) by mouth 2 (two) times daily. Start the day after chemo for 3 days. Then take as needed for nausea or vomiting.     pegfilgrastim 6 MG/0.6ML injection  Commonly known as:  NEULASTA  Inject 6 mg into the skin once.     potassium chloride SA 20 MEQ tablet  Commonly known as:  K-DUR,KLOR-CON  Take 2 tablets (40 mEq total) by mouth daily.     PRESCRIPTION MEDICATION  Chemo - [facility]     prochlorperazine 10 MG tablet  Commonly known as:  COMPAZINE  Take 10 mg by mouth every 6 (six) hours as needed for nausea or vomiting.     propranolol ER 60 MG 24 hr capsule  Commonly known as:  INDERAL LA  TAKE 1 CAPSULE BY MOUTH AT BEDTIME     pyridOXINE 100 MG tablet  Commonly known as:  VITAMIN  B-6  Take 2 pills at one time once a day.     SUPER B COMPLEX PO  Take 1 tablet by mouth daily.     traZODone 50 MG tablet  Commonly known as:  DESYREL  Take 1 tablet (50 mg total) by mouth at bedtime.     venlafaxine 37.5 MG tablet  Commonly known as:  EFFEXOR  TAKE 1 TABLET BY MOUTH TWICE DAILY WITH A MEAL     Vitamin D 2000 UNITS tablet  Take 2,000 Units by mouth daily.        Allergies:  Allergies  Allergen Reactions  . Penicillins Other (See Comments)    Hallucinations, from childhood    Past Medical History, Surgical history, Social history, and Family History were reviewed and updated.  Review of Systems: All other 10 point review of systems is negative.   Physical Exam:  vitals were not taken for this visit.  Wt Readings from Last 3 Encounters:  09/17/15 184 lb (83.462 kg)  09/03/15 185 lb (83.915 kg)  08/08/15 186 lb 4.6 oz (84.5 kg)    Ocular: Sclerae unicteric, pupils equal, round and reactive to light Ear-nose-throat: Oropharynx clear, dentition fair Lymphatic: No cervical or supraclavicular adenopathy Lungs no rales or rhonchi, good excursion bilaterally Heart regular rate and rhythm, no murmur appreciated Abd soft, nontender, positive bowel sounds MSK no focal spinal tenderness, no joint edema Neuro: non-focal, well-oriented, appropriate affect Breasts: Deferred  Lab Results  Component Value Date   WBC 9.4 10/08/2015   HGB 11.4* 10/08/2015   HCT 34.7* 10/08/2015   MCV 101* 10/08/2015   PLT 267 10/08/2015   Lab Results  Component Value Date   FERRITIN 35 10/03/2014   IRON 40* 10/03/2014   TIBC 279 10/03/2014   UIBC 239 10/03/2014   IRONPCTSAT 14* 10/03/2014   Lab Results  Component Value Date   RBC 3.44* 10/08/2015   No results found for: KPAFRELGTCHN, LAMBDASER, KAPLAMBRATIO No results found for: IGGSERUM, IGA, IGMSERUM No results found for: Ronnald Ramp, A1GS, A2GS, Violet Baldy, MSPIKE, SPEI   Chemistry       Component Value Date/Time   NA 135 10/08/2015 0900   NA 133* 08/18/2015 1550   K 3.7 10/08/2015 0900   K 4.6 08/18/2015 1550   CL 102 10/08/2015 0900   CL 101 08/18/2015 1550   CO2 25 10/08/2015  0900   CO2 24 08/18/2015 1550   BUN 13 10/08/2015 0900   BUN 14 08/18/2015 1550   CREATININE 0.7 10/08/2015 0900   CREATININE 1.32* 08/18/2015 1550      Component Value Date/Time   CALCIUM 9.0 10/08/2015 0900   CALCIUM 8.3* 08/18/2015 1550   ALKPHOS 95* 10/08/2015 0900   ALKPHOS 41 08/18/2015 1550   AST 21 10/08/2015 0900   AST 22 08/18/2015 1550   ALT 12 10/08/2015 0900   ALT 14* 08/18/2015 1550   BILITOT 0.60 10/08/2015 0900   BILITOT 0.5 08/18/2015 1550     Impression and Plan: Mr. Adduci is 64 yo white male with metastatic adenocarcinoma of the distal esophagus. His latest PET scan showed a response to treatment. He is doing much better now.  We will proceed with cycle 8 of treatment today as planned.  We will repeat scans on him after 2 more cycles.  We will give him his weekly fluids today to prevent dehydration.  We will get him a new treatment/appointment schedule today.  Both he and his wife know to contact us with any questions or concerns. We can certainly see him sooner if need be.   Eliezer Bottom, NP 10/12/201610:01 AM

## 2015-10-09 ENCOUNTER — Ambulatory Visit: Payer: 59

## 2015-10-09 ENCOUNTER — Ambulatory Visit: Payer: 59 | Admitting: Family

## 2015-10-09 ENCOUNTER — Other Ambulatory Visit: Payer: 59

## 2015-10-10 ENCOUNTER — Ambulatory Visit (HOSPITAL_BASED_OUTPATIENT_CLINIC_OR_DEPARTMENT_OTHER): Payer: 59

## 2015-10-10 VITALS — BP 105/67 | HR 73 | Temp 97.9°F | Resp 18

## 2015-10-10 DIAGNOSIS — C155 Malignant neoplasm of lower third of esophagus: Secondary | ICD-10-CM

## 2015-10-10 DIAGNOSIS — Z5189 Encounter for other specified aftercare: Secondary | ICD-10-CM

## 2015-10-10 MED ORDER — HEPARIN SOD (PORK) LOCK FLUSH 100 UNIT/ML IV SOLN
500.0000 [IU] | Freq: Once | INTRAVENOUS | Status: AC | PRN
  Administered 2015-10-10: 500 [IU]
  Filled 2015-10-10: qty 5

## 2015-10-10 MED ORDER — SODIUM CHLORIDE 0.9 % IJ SOLN
10.0000 mL | INTRAMUSCULAR | Status: DC | PRN
  Administered 2015-10-10: 10 mL
  Filled 2015-10-10: qty 10

## 2015-10-10 MED ORDER — PEGFILGRASTIM INJECTION 6 MG/0.6ML ~~LOC~~
6.0000 mg | PREFILLED_SYRINGE | Freq: Once | SUBCUTANEOUS | Status: AC
  Administered 2015-10-10: 6 mg via SUBCUTANEOUS

## 2015-10-10 NOTE — Patient Instructions (Signed)
[facility] Discharge Instructions for Patients Receiving Chemotherapy  Today you received the following chemotherapy agents 5-FU.  To help prevent nausea and vomiting after your treatment, we encourage you to take your nausea medication.   If you develop nausea and vomiting that is not controlled by your nausea medication, call the clinic.   BELOW ARE SYMPTOMS THAT SHOULD BE REPORTED IMMEDIATELY:  *FEVER GREATER THAN 100.5 F  *CHILLS WITH OR WITHOUT FEVER  NAUSEA AND VOMITING THAT IS NOT CONTROLLED WITH YOUR NAUSEA MEDICATION  *UNUSUAL SHORTNESS OF BREATH  *UNUSUAL BRUISING OR BLEEDING  TENDERNESS IN MOUTH AND THROAT WITH OR WITHOUT PRESENCE OF ULCERS  *URINARY PROBLEMS  *BOWEL PROBLEMS  UNUSUAL RASH Items with * indicate a potential emergency and should be followed up as soon as possible.  Feel free to call the clinic you have any questions or concerns. The clinic phone number is [phone].  Please show the CHEMO ALERT CARD at check-in to the Emergency Department and triage nurse.   

## 2015-10-15 ENCOUNTER — Other Ambulatory Visit: Payer: Self-pay | Admitting: Hematology & Oncology

## 2015-10-15 ENCOUNTER — Other Ambulatory Visit: Payer: Self-pay

## 2015-10-15 ENCOUNTER — Other Ambulatory Visit (HOSPITAL_BASED_OUTPATIENT_CLINIC_OR_DEPARTMENT_OTHER): Payer: 59

## 2015-10-15 ENCOUNTER — Ambulatory Visit (HOSPITAL_BASED_OUTPATIENT_CLINIC_OR_DEPARTMENT_OTHER): Payer: 59

## 2015-10-15 VITALS — BP 118/70 | HR 74 | Temp 97.6°F | Resp 18

## 2015-10-15 DIAGNOSIS — C159 Malignant neoplasm of esophagus, unspecified: Secondary | ICD-10-CM

## 2015-10-15 DIAGNOSIS — C158 Malignant neoplasm of overlapping sites of esophagus: Secondary | ICD-10-CM

## 2015-10-15 DIAGNOSIS — C155 Malignant neoplasm of lower third of esophagus: Secondary | ICD-10-CM

## 2015-10-15 DIAGNOSIS — E860 Dehydration: Secondary | ICD-10-CM | POA: Diagnosis not present

## 2015-10-15 LAB — CBC WITH DIFFERENTIAL (CANCER CENTER ONLY)
BASO#: 0 10*3/uL (ref 0.0–0.2)
BASO%: 0.3 % (ref 0.0–2.0)
EOS%: 1.5 % (ref 0.0–7.0)
Eosinophils Absolute: 0.1 10*3/uL (ref 0.0–0.5)
HCT: 33.2 % — ABNORMAL LOW (ref 38.7–49.9)
HEMOGLOBIN: 10.9 g/dL — AB (ref 13.0–17.1)
LYMPH#: 1.7 10*3/uL (ref 0.9–3.3)
LYMPH%: 24.7 % (ref 14.0–48.0)
MCH: 32.7 pg (ref 28.0–33.4)
MCHC: 32.8 g/dL (ref 32.0–35.9)
MCV: 100 fL — ABNORMAL HIGH (ref 82–98)
MONO#: 0.7 10*3/uL (ref 0.1–0.9)
MONO%: 10.4 % (ref 0.0–13.0)
NEUT%: 63.1 % (ref 40.0–80.0)
NEUTROS ABS: 4.3 10*3/uL (ref 1.5–6.5)
Platelets: 211 10*3/uL (ref 145–400)
RBC: 3.33 10*6/uL — ABNORMAL LOW (ref 4.20–5.70)
RDW: 16.3 % — ABNORMAL HIGH (ref 11.1–15.7)
WBC: 6.7 10*3/uL (ref 4.0–10.0)

## 2015-10-15 LAB — CMP (CANCER CENTER ONLY)
ALT(SGPT): 13 U/L (ref 10–47)
AST: 18 U/L (ref 11–38)
Albumin: 3.4 g/dL (ref 3.3–5.5)
Alkaline Phosphatase: 103 U/L — ABNORMAL HIGH (ref 26–84)
BUN: 14 mg/dL (ref 7–22)
CHLORIDE: 104 meq/L (ref 98–108)
CO2: 26 meq/L (ref 18–33)
CREATININE: 0.7 mg/dL (ref 0.6–1.2)
Calcium: 9.1 mg/dL (ref 8.0–10.3)
Glucose, Bld: 116 mg/dL (ref 73–118)
Potassium: 3.7 mEq/L (ref 3.3–4.7)
SODIUM: 135 meq/L (ref 128–145)
TOTAL PROTEIN: 6.1 g/dL — AB (ref 6.4–8.1)
Total Bilirubin: 0.9 mg/dl (ref 0.20–1.60)

## 2015-10-15 LAB — LACTATE DEHYDROGENASE: LDH: 129 U/L (ref 94–250)

## 2015-10-15 MED ORDER — SODIUM CHLORIDE 0.9 % IJ SOLN
10.0000 mL | INTRAMUSCULAR | Status: DC | PRN
  Administered 2015-10-15: 10 mL via INTRAVENOUS
  Filled 2015-10-15: qty 10

## 2015-10-15 MED ORDER — HEPARIN SOD (PORK) LOCK FLUSH 100 UNIT/ML IV SOLN
500.0000 [IU] | Freq: Once | INTRAVENOUS | Status: AC
  Administered 2015-10-15: 500 [IU] via INTRAVENOUS
  Filled 2015-10-15: qty 5

## 2015-10-15 MED ORDER — SODIUM CHLORIDE 0.9 % IV SOLN
1000.0000 mL | Freq: Once | INTRAVENOUS | Status: AC
  Administered 2015-10-15: 1000 mL via INTRAVENOUS

## 2015-10-15 NOTE — Patient Instructions (Signed)

## 2015-10-17 ENCOUNTER — Other Ambulatory Visit: Payer: Self-pay | Admitting: Hematology & Oncology

## 2015-10-17 ENCOUNTER — Other Ambulatory Visit: Payer: Self-pay

## 2015-10-21 ENCOUNTER — Other Ambulatory Visit: Payer: Self-pay | Admitting: Emergency Medicine

## 2015-10-21 DIAGNOSIS — C159 Malignant neoplasm of esophagus, unspecified: Secondary | ICD-10-CM

## 2015-10-22 ENCOUNTER — Other Ambulatory Visit (HOSPITAL_BASED_OUTPATIENT_CLINIC_OR_DEPARTMENT_OTHER): Payer: 59

## 2015-10-22 ENCOUNTER — Other Ambulatory Visit: Payer: 59

## 2015-10-22 ENCOUNTER — Ambulatory Visit (HOSPITAL_BASED_OUTPATIENT_CLINIC_OR_DEPARTMENT_OTHER): Payer: 59

## 2015-10-22 ENCOUNTER — Ambulatory Visit: Payer: 59

## 2015-10-22 VITALS — BP 122/64 | HR 74 | Temp 97.1°F | Resp 18

## 2015-10-22 DIAGNOSIS — C159 Malignant neoplasm of esophagus, unspecified: Secondary | ICD-10-CM

## 2015-10-22 DIAGNOSIS — Z5111 Encounter for antineoplastic chemotherapy: Secondary | ICD-10-CM

## 2015-10-22 DIAGNOSIS — C155 Malignant neoplasm of lower third of esophagus: Secondary | ICD-10-CM

## 2015-10-22 LAB — CMP (CANCER CENTER ONLY)
ALK PHOS: 122 U/L — AB (ref 26–84)
ALT(SGPT): 14 U/L (ref 10–47)
AST: 22 U/L (ref 11–38)
Albumin: 3.7 g/dL (ref 3.3–5.5)
BUN: 14 mg/dL (ref 7–22)
CHLORIDE: 106 meq/L (ref 98–108)
CO2: 24 meq/L (ref 18–33)
Calcium: 9.4 mg/dL (ref 8.0–10.3)
Creat: 1.1 mg/dl (ref 0.6–1.2)
Glucose, Bld: 115 mg/dL (ref 73–118)
POTASSIUM: 4.1 meq/L (ref 3.3–4.7)
SODIUM: 136 meq/L (ref 128–145)
Total Bilirubin: 0.7 mg/dl (ref 0.20–1.60)
Total Protein: 6.5 g/dL (ref 6.4–8.1)

## 2015-10-22 LAB — CBC WITH DIFFERENTIAL (CANCER CENTER ONLY)
BASO#: 0 10*3/uL (ref 0.0–0.2)
BASO%: 0.3 % (ref 0.0–2.0)
EOS ABS: 0.3 10*3/uL (ref 0.0–0.5)
EOS%: 2.9 % (ref 0.0–7.0)
HCT: 38 % — ABNORMAL LOW (ref 38.7–49.9)
HGB: 12.4 g/dL — ABNORMAL LOW (ref 13.0–17.1)
LYMPH#: 2.6 10*3/uL (ref 0.9–3.3)
LYMPH%: 22.6 % (ref 14.0–48.0)
MCH: 32.5 pg (ref 28.0–33.4)
MCHC: 32.6 g/dL (ref 32.0–35.9)
MCV: 100 fL — AB (ref 82–98)
MONO#: 1 10*3/uL — AB (ref 0.1–0.9)
MONO%: 8.8 % (ref 0.0–13.0)
NEUT#: 7.4 10*3/uL — ABNORMAL HIGH (ref 1.5–6.5)
NEUT%: 65.4 % (ref 40.0–80.0)
PLATELETS: 201 10*3/uL (ref 145–400)
RBC: 3.82 10*6/uL — AB (ref 4.20–5.70)
RDW: 16.9 % — AB (ref 11.1–15.7)
WBC: 11.3 10*3/uL — AB (ref 4.0–10.0)

## 2015-10-22 LAB — TECHNOLOGIST REVIEW CHCC SATELLITE

## 2015-10-22 MED ORDER — IRINOTECAN HCL CHEMO INJECTION 100 MG/5ML
133.0000 mg/m2 | Freq: Once | INTRAVENOUS | Status: AC
  Administered 2015-10-22: 300 mg via INTRAVENOUS
  Filled 2015-10-22: qty 15

## 2015-10-22 MED ORDER — SODIUM CHLORIDE 0.9 % IV SOLN
1980.0000 mg/m2 | INTRAVENOUS | Status: DC
  Administered 2015-10-22: 4450 mg via INTRAVENOUS
  Filled 2015-10-22: qty 89

## 2015-10-22 MED ORDER — SODIUM CHLORIDE 0.9 % IV SOLN
Freq: Once | INTRAVENOUS | Status: AC
  Administered 2015-10-22: 10:00:00 via INTRAVENOUS

## 2015-10-22 MED ORDER — FLUOROURACIL CHEMO INJECTION 2.5 GM/50ML
400.0000 mg/m2 | Freq: Once | INTRAVENOUS | Status: AC
  Administered 2015-10-22: 900 mg via INTRAVENOUS
  Filled 2015-10-22: qty 18

## 2015-10-22 MED ORDER — SODIUM CHLORIDE 0.9 % IV SOLN
Freq: Once | INTRAVENOUS | Status: AC
  Administered 2015-10-22: 11:00:00 via INTRAVENOUS
  Filled 2015-10-22: qty 8

## 2015-10-22 MED ORDER — LEUCOVORIN CALCIUM INJECTION 350 MG
400.0000 mg/m2 | Freq: Once | INTRAVENOUS | Status: AC
  Administered 2015-10-22: 900 mg via INTRAVENOUS
  Filled 2015-10-22: qty 45

## 2015-10-22 NOTE — Patient Instructions (Signed)
[facility] Discharge Instructions for Patients Receiving Chemotherapy  Today you received the following chemotherapy agents Campatosar, Leucovorin, 5Fu.  To help prevent nausea and vomiting after your treatment, we encourage you to take your nausea medication as prescribed.    If you develop nausea and vomiting that is not controlled by your nausea medication, call the clinic.   BELOW ARE SYMPTOMS THAT SHOULD BE REPORTED IMMEDIATELY:  *FEVER GREATER THAN 100.5 F  *CHILLS WITH OR WITHOUT FEVER  NAUSEA AND VOMITING THAT IS NOT CONTROLLED WITH YOUR NAUSEA MEDICATION  *UNUSUAL SHORTNESS OF BREATH  *UNUSUAL BRUISING OR BLEEDING  TENDERNESS IN MOUTH AND THROAT WITH OR WITHOUT PRESENCE OF ULCERS  *URINARY PROBLEMS  *BOWEL PROBLEMS  UNUSUAL RASH Items with * indicate a potential emergency and should be followed up as soon as possible.  Feel free to call the clinic you have any questions or concerns. The clinic phone number is [phone].  Please show the [facility] at check-in to the Emergency Department and triage nurse.

## 2015-10-24 ENCOUNTER — Ambulatory Visit: Payer: 59

## 2015-10-24 ENCOUNTER — Ambulatory Visit (HOSPITAL_BASED_OUTPATIENT_CLINIC_OR_DEPARTMENT_OTHER): Payer: 59

## 2015-10-24 VITALS — BP 121/59 | HR 67 | Temp 97.9°F | Resp 18

## 2015-10-24 DIAGNOSIS — C155 Malignant neoplasm of lower third of esophagus: Secondary | ICD-10-CM

## 2015-10-24 DIAGNOSIS — Z5189 Encounter for other specified aftercare: Secondary | ICD-10-CM | POA: Diagnosis not present

## 2015-10-24 MED ORDER — HEPARIN SOD (PORK) LOCK FLUSH 100 UNIT/ML IV SOLN
500.0000 [IU] | Freq: Once | INTRAVENOUS | Status: AC | PRN
  Administered 2015-10-24: 500 [IU]
  Filled 2015-10-24: qty 5

## 2015-10-24 MED ORDER — SODIUM CHLORIDE 0.9 % IJ SOLN
10.0000 mL | INTRAMUSCULAR | Status: DC | PRN
  Administered 2015-10-24: 10 mL
  Filled 2015-10-24: qty 10

## 2015-10-24 MED ORDER — PEGFILGRASTIM INJECTION 6 MG/0.6ML ~~LOC~~
6.0000 mg | PREFILLED_SYRINGE | Freq: Once | SUBCUTANEOUS | Status: AC
  Administered 2015-10-24: 6 mg via SUBCUTANEOUS

## 2015-10-24 NOTE — Patient Instructions (Signed)
Pegfilgrastim injection What is this medicine? PEGFILGRASTIM (PEG fil gra stim) is a long-acting granulocyte colony-stimulating factor that stimulates the growth of neutrophils, a type of white blood cell important in the body's fight against infection. It is used to reduce the incidence of fever and infection in patients with certain types of cancer who are receiving chemotherapy that affects the bone marrow, and to increase survival after being exposed to high doses of radiation. This medicine may be used for other purposes; ask your health care provider or pharmacist if you have questions. What should I tell my health care provider before I take this medicine? They need to know if you have any of these conditions: -kidney disease -latex allergy -ongoing radiation therapy -sickle cell disease -skin reactions to acrylic adhesives (On-Body Injector only) -an unusual or allergic reaction to pegfilgrastim, filgrastim, other medicines, foods, dyes, or preservatives -pregnant or trying to get pregnant -breast-feeding How should I use this medicine? This medicine is for injection under the skin. If you get this medicine at home, you will be taught how to prepare and give the pre-filled syringe or how to use the On-body Injector. Refer to the patient Instructions for Use for detailed instructions. Use exactly as directed. Take your medicine at regular intervals. Do not take your medicine more often than directed. It is important that you put your used needles and syringes in a special sharps container. Do not put them in a trash can. If you do not have a sharps container, call your pharmacist or healthcare provider to get one. Talk to your pediatrician regarding the use of this medicine in children. While this drug may be prescribed for selected conditions, precautions do apply. Overdosage: If you think you have taken too much of this medicine contact a poison control center or emergency room at  once. NOTE: This medicine is only for you. Do not share this medicine with others. What if I miss a dose? It is important not to miss your dose. Call your doctor or health care professional if you miss your dose. If you miss a dose due to an On-body Injector failure or leakage, a new dose should be administered as soon as possible using a single prefilled syringe for manual use. What may interact with this medicine? Interactions have not been studied. Give your health care provider a list of all the medicines, herbs, non-prescription drugs, or dietary supplements you use. Also tell them if you smoke, drink alcohol, or use illegal drugs. Some items may interact with your medicine. This list may not describe all possible interactions. Give your health care provider a list of all the medicines, herbs, non-prescription drugs, or dietary supplements you use. Also tell them if you smoke, drink alcohol, or use illegal drugs. Some items may interact with your medicine. What should I watch for while using this medicine? You may need blood work done while you are taking this medicine. If you are going to need a MRI, CT scan, or other procedure, tell your doctor that you are using this medicine (On-Body Injector only). What side effects may I notice from receiving this medicine? Side effects that you should report to your doctor or health care professional as soon as possible: -allergic reactions like skin rash, itching or hives, swelling of the face, lips, or tongue -dizziness -fever -pain, redness, or irritation at site where injected -pinpoint red spots on the skin -red or dark-brown urine -shortness of breath or breathing problems -stomach or side pain, or pain   at the shoulder -swelling -tiredness -trouble passing urine or change in the amount of urine Side effects that usually do not require medical attention (report to your doctor or health care professional if they continue or are  bothersome): -bone pain -muscle pain This list may not describe all possible side effects. Call your doctor for medical advice about side effects. You may report side effects to FDA at 1-800-FDA-1088. Where should I keep my medicine? Keep out of the reach of children. Store pre-filled syringes in a refrigerator between 2 and 8 degrees C (36 and 46 degrees F). Do not freeze. Keep in carton to protect from light. Throw away this medicine if it is left out of the refrigerator for more than 48 hours. Throw away any unused medicine after the expiration date. NOTE: This sheet is a summary. It may not cover all possible information. If you have questions about this medicine, talk to your doctor, pharmacist, or health care provider.    2016, Elsevier/Gold Standard. (2015-01-02 14:30:14)  

## 2015-10-29 ENCOUNTER — Other Ambulatory Visit: Payer: 59

## 2015-10-29 ENCOUNTER — Ambulatory Visit (HOSPITAL_BASED_OUTPATIENT_CLINIC_OR_DEPARTMENT_OTHER): Payer: 59

## 2015-10-29 ENCOUNTER — Ambulatory Visit: Payer: 59

## 2015-10-29 ENCOUNTER — Ambulatory Visit: Payer: 59 | Admitting: Family

## 2015-10-29 VITALS — BP 108/62 | HR 71 | Temp 98.2°F | Resp 18

## 2015-10-29 DIAGNOSIS — C159 Malignant neoplasm of esophagus, unspecified: Secondary | ICD-10-CM | POA: Diagnosis not present

## 2015-10-29 MED ORDER — SODIUM CHLORIDE 0.9 % IV SOLN
INTRAVENOUS | Status: DC
  Administered 2015-10-29: 11:00:00 via INTRAVENOUS

## 2015-10-29 MED ORDER — HEPARIN SOD (PORK) LOCK FLUSH 100 UNIT/ML IV SOLN
500.0000 [IU] | Freq: Once | INTRAVENOUS | Status: AC
  Administered 2015-10-29: 500 [IU] via INTRAVENOUS
  Filled 2015-10-29: qty 5

## 2015-10-29 MED ORDER — SODIUM CHLORIDE 0.9 % IJ SOLN
10.0000 mL | INTRAMUSCULAR | Status: DC | PRN
  Administered 2015-10-29: 10 mL via INTRAVENOUS
  Filled 2015-10-29: qty 10

## 2015-10-29 NOTE — Patient Instructions (Signed)

## 2015-11-04 ENCOUNTER — Other Ambulatory Visit: Payer: Self-pay | Admitting: *Deleted

## 2015-11-04 DIAGNOSIS — C159 Malignant neoplasm of esophagus, unspecified: Secondary | ICD-10-CM

## 2015-11-05 ENCOUNTER — Ambulatory Visit: Payer: 59

## 2015-11-05 ENCOUNTER — Ambulatory Visit (HOSPITAL_BASED_OUTPATIENT_CLINIC_OR_DEPARTMENT_OTHER): Payer: 59

## 2015-11-05 ENCOUNTER — Other Ambulatory Visit (HOSPITAL_BASED_OUTPATIENT_CLINIC_OR_DEPARTMENT_OTHER): Payer: 59

## 2015-11-05 ENCOUNTER — Ambulatory Visit (HOSPITAL_BASED_OUTPATIENT_CLINIC_OR_DEPARTMENT_OTHER): Payer: 59 | Admitting: Family

## 2015-11-05 ENCOUNTER — Encounter: Payer: Self-pay | Admitting: Hematology & Oncology

## 2015-11-05 VITALS — BP 123/63 | HR 80 | Resp 14 | Ht 78.0 in | Wt 173.0 lb

## 2015-11-05 DIAGNOSIS — C159 Malignant neoplasm of esophagus, unspecified: Secondary | ICD-10-CM

## 2015-11-05 DIAGNOSIS — C155 Malignant neoplasm of lower third of esophagus: Secondary | ICD-10-CM

## 2015-11-05 DIAGNOSIS — R200 Anesthesia of skin: Secondary | ICD-10-CM | POA: Diagnosis not present

## 2015-11-05 DIAGNOSIS — R102 Pelvic and perineal pain unspecified side: Secondary | ICD-10-CM

## 2015-11-05 DIAGNOSIS — R634 Abnormal weight loss: Secondary | ICD-10-CM

## 2015-11-05 DIAGNOSIS — R64 Cachexia: Secondary | ICD-10-CM

## 2015-11-05 LAB — CBC WITH DIFFERENTIAL (CANCER CENTER ONLY)
BASO#: 0 10*3/uL (ref 0.0–0.2)
BASO%: 0.2 % (ref 0.0–2.0)
EOS%: 0.7 % (ref 0.0–7.0)
Eosinophils Absolute: 0.1 10*3/uL (ref 0.0–0.5)
HCT: 38.3 % — ABNORMAL LOW (ref 38.7–49.9)
HGB: 12.6 g/dL — ABNORMAL LOW (ref 13.0–17.1)
LYMPH#: 1.8 10*3/uL (ref 0.9–3.3)
LYMPH%: 16.3 % (ref 14.0–48.0)
MCH: 32.1 pg (ref 28.0–33.4)
MCHC: 32.9 g/dL (ref 32.0–35.9)
MCV: 98 fL (ref 82–98)
MONO#: 1.1 10*3/uL — AB (ref 0.1–0.9)
MONO%: 10.4 % (ref 0.0–13.0)
NEUT#: 7.8 10*3/uL — ABNORMAL HIGH (ref 1.5–6.5)
NEUT%: 72.4 % (ref 40.0–80.0)
PLATELETS: 256 10*3/uL (ref 145–400)
RBC: 3.93 10*6/uL — ABNORMAL LOW (ref 4.20–5.70)
RDW: 17 % — AB (ref 11.1–15.7)
WBC: 10.8 10*3/uL — AB (ref 4.0–10.0)

## 2015-11-05 MED ORDER — DRONABINOL 5 MG PO CAPS: 5.0000 mg | ORAL_CAPSULE | Freq: Two times a day (BID) | ORAL | Status: DC

## 2015-11-05 MED ORDER — SODIUM CHLORIDE 0.9 % IV SOLN
Freq: Once | INTRAVENOUS | Status: AC
  Administered 2015-11-05: 10:00:00 via INTRAVENOUS

## 2015-11-05 NOTE — Patient Instructions (Signed)

## 2015-11-05 NOTE — Progress Notes (Signed)
Hematology and Oncology Follow Up Visit  Willie Owens [id number] December 16, 1951 64 y.o. 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 He has not been eating or drinking well. His weight is down 12 lbs. He has been taking his Marinol at times.  He states that he does feel dehydrated.  He has had bouts of diarrhea "off and on" that resolves with imodium. No blood in his stool.  His PET scan in August showed an interval response to therapy.  He has had no fever, chills, n/v, rash, dizziness, SOB, chest pain, palpitations, abdominal pain or changes in bladder habits.  The numbness and tingling in his hands and feet is mild and unchanged. He has no c/o pain at this time. No swelling or tenderness in his extremities.   Medications:    Medication List       This list is accurate as of: 11/05/15 10:09 AM.  Always use your most recent med list.               ALIGN 4 MG Caps  Take 1 capsule by mouth daily with breakfast.     aspirin 81 MG chewable tablet  Chew 1 tablet (81 mg total) by mouth daily.     benzonatate 100 MG capsule  Commonly known as:  TESSALON PERLES  Take 1 capsule (100 mg total) by mouth 3 (three) times daily as needed for cough.     bismuth subsalicylate 262 MB/55HR suspension  Commonly known as:  PEPTO BISMOL  Take 30 mLs by mouth every 6 (six) hours as needed for diarrhea or loose stools.     cetirizine 10 MG tablet  Commonly known as:  ZYRTEC  Take 10 mg by mouth daily as needed for allergies.     diphenhydrAMINE 25 MG tablet  Commonly known as:  BENADRYL  Take 25 mg by mouth every 6 (six) hours as needed for allergies.     diphenoxylate-atropine 2.5-0.025 MG tablet  Commonly known as:  LOMOTIL  TAKE 1 TABLET BY MOUTH FOUR TIMES DAILY AS NEEDED  FOR DIARRHEA     dronabinol 5 MG capsule  Commonly known as:  MARINOL  Take 1 capsule (5 mg total) by mouth 2 (two) times daily before a meal.     lidocaine-prilocaine cream  Commonly known as:  EMLA  Apply 1 application topically as needed.     lipase/protease/amylase 36000 UNITS Cpep capsule  Commonly known as:  CREON  Take 1 capsule (36,000 Units total) by mouth 3 (three) times daily before meals.     loperamide 2 MG tablet  Commonly known as:  IMODIUM A-D  Take 2 at onset of diarrhea, then 1 every 2hrs until 12hr without a BM. May take 2 tab every 4hrs at bedtime. If diarrhea recurs repeat.     MAGNESIUM-OXIDE 400 (241.3 MG) MG tablet  Generic drug:  magnesium oxide  TK 1 T PO BID     multivitamin with minerals Tabs tablet  Take 1 tablet by mouth daily.     omeprazole 40 MG capsule  Commonly known as:  PRILOSEC  Take 1 capsule (40 mg total) by mouth daily.     ondansetron 4 MG disintegrating tablet  Commonly known as:  ZOFRAN ODT  Take 1 tablet (4  mg total) by mouth every 8 (eight) hours as needed for nausea or vomiting.     ondansetron 8 MG tablet  Commonly known as:  ZOFRAN  Take 1 tablet (8 mg total) by mouth 2 (two) times daily. Start the day after chemo for 3 days. Then take as needed for nausea or vomiting.     pegfilgrastim 6 MG/0.6ML injection  Commonly known as:  NEULASTA  Inject 6 mg into the skin once.     potassium chloride SA 20 MEQ tablet  Commonly known as:  K-DUR,KLOR-CON  Take 2 tablets (40 mEq total) by mouth daily.     PRESCRIPTION MEDICATION  Chemo - [facility]     prochlorperazine 10 MG tablet  Commonly known as:  COMPAZINE  Take 10 mg by mouth every 6 (six) hours as needed for nausea or vomiting.     propranolol ER 60 MG 24 hr capsule  Commonly known as:  INDERAL LA  TAKE 1 CAPSULE BY MOUTH AT BEDTIME     pyridOXINE 100 MG tablet  Commonly known as:  VITAMIN B-6  Take 2 pills at one time once a day.     SUPER B COMPLEX PO  Take 1 tablet  by mouth daily.     traZODone 50 MG tablet  Commonly known as:  DESYREL  Take 1 tablet (50 mg total) by mouth at bedtime.     venlafaxine 37.5 MG tablet  Commonly known as:  EFFEXOR  TAKE 1 TABLET BY MOUTH TWICE DAILY WITH A MEAL     Vitamin D 2000 UNITS tablet  Take 2,000 Units by mouth daily.        Allergies:  Allergies  Allergen Reactions  . Penicillins Other (See Comments)    Hallucinations, from childhood    Past Medical History, Surgical history, Social history, and Family History were reviewed and updated.  Review of Systems: All other 10 point review of systems is negative.   Physical Exam:  height is 6' 6"  (1.981 m) and weight is 173 lb (78.472 kg). His blood pressure is 123/63 and his pulse is 80. His respiration is 14.   Wt Readings from Last 3 Encounters:  11/05/15 173 lb (78.472 kg)  10/08/15 185 lb (83.915 kg)  09/17/15 184 lb (83.462 kg)    Ocular: Sclerae unicteric, pupils equal, round and reactive to light Ear-nose-throat: Oropharynx clear, dentition fair Lymphatic: No cervical or supraclavicular adenopathy Lungs no rales or rhonchi, good excursion bilaterally Heart regular rate and rhythm, no murmur appreciated Abd soft, nontender, positive bowel sounds MSK no focal spinal tenderness, no joint edema Neuro: non-focal, well-oriented, appropriate affect Breasts: Deferred  Lab Results  Component Value Date   WBC 10.8* 11/05/2015   HGB 12.6* 11/05/2015   HCT 38.3* 11/05/2015   MCV 98 11/05/2015   PLT 256 11/05/2015   Lab Results  Component Value Date   FERRITIN 35 10/03/2014   IRON 40* 10/03/2014   TIBC 279 10/03/2014   UIBC 239 10/03/2014   IRONPCTSAT 14* 10/03/2014   Lab Results  Component Value Date   RBC 3.93* 11/05/2015   No results found for: KPAFRELGTCHN, LAMBDASER, KAPLAMBRATIO No results found for: IGGSERUM, IGA, IGMSERUM No results found for: Ronnald Ramp, A1GS, A2GS, Violet Baldy, MSPIKE, SPEI    Chemistry      Component Value Date/Time   NA 136 10/22/2015 1001   NA 133* 08/18/2015 1550   K 4.1 10/22/2015 1001   K 4.6 08/18/2015 1550   CL 106 10/22/2015  1001   CL 101 08/18/2015 1550   CO2 24 10/22/2015 1001   CO2 24 08/18/2015 1550   BUN 14 10/22/2015 1001   BUN 14 08/18/2015 1550   CREATININE 1.1 10/22/2015 1001   CREATININE 1.32* 08/18/2015 1550      Component Value Date/Time   CALCIUM 9.4 10/22/2015 1001   CALCIUM 8.3* 08/18/2015 1550   ALKPHOS 122* 10/22/2015 1001   ALKPHOS 41 08/18/2015 1550   AST 22 10/22/2015 1001   AST 22 08/18/2015 1550   ALT 14 10/22/2015 1001   ALT 14* 08/18/2015 1550   BILITOT 0.70 10/22/2015 1001   BILITOT 0.5 08/18/2015 1550     Impression and Plan: Mr. Maclay is 64 yo white male with metastatic adenocarcinoma of the distal esophagus. Unfortunately his weight is down 12 lbs and he has not been eating or drinking much. He states that he feels dehydrated today.  I refilled his Marinol prescription. He will also supplement with Boost as needed.  We will give him fluids and hold treatment today.  He will get another PET scan in 2 weeks and we will plan to see him back for a follow-up and labs in 3 weeks.  Both he and his wife know to contact us with any questions or concerns. We can certainly see him sooner if need be.   Eliezer Bottom, NP 11/9/201610:09 AM

## 2015-11-07 ENCOUNTER — Ambulatory Visit: Payer: 59

## 2015-11-13 ENCOUNTER — Ambulatory Visit (HOSPITAL_BASED_OUTPATIENT_CLINIC_OR_DEPARTMENT_OTHER): Payer: 59

## 2015-11-13 VITALS — BP 126/60 | HR 72 | Temp 97.4°F | Resp 16

## 2015-11-13 DIAGNOSIS — R197 Diarrhea, unspecified: Secondary | ICD-10-CM

## 2015-11-13 MED ORDER — HEPARIN SOD (PORK) LOCK FLUSH 100 UNIT/ML IV SOLN
500.0000 [IU] | Freq: Once | INTRAVENOUS | Status: AC
  Administered 2015-11-13: 500 [IU] via INTRAVENOUS
  Filled 2015-11-13: qty 5

## 2015-11-13 MED ORDER — SODIUM CHLORIDE 0.9 % IV SOLN
INTRAVENOUS | Status: DC
  Administered 2015-11-13: 12:00:00 via INTRAVENOUS

## 2015-11-13 MED ORDER — SODIUM CHLORIDE 0.9 % IJ SOLN
10.0000 mL | INTRAMUSCULAR | Status: DC | PRN
  Administered 2015-11-13: 10 mL via INTRAVENOUS
  Filled 2015-11-13: qty 10

## 2015-11-13 NOTE — Patient Instructions (Signed)

## 2015-11-18 ENCOUNTER — Other Ambulatory Visit: Payer: 59

## 2015-11-18 ENCOUNTER — Ambulatory Visit: Payer: 59

## 2015-11-18 ENCOUNTER — Ambulatory Visit: Payer: 59 | Admitting: Hematology & Oncology

## 2015-11-18 ENCOUNTER — Ambulatory Visit (HOSPITAL_BASED_OUTPATIENT_CLINIC_OR_DEPARTMENT_OTHER): Payer: 59

## 2015-11-18 VITALS — BP 127/56 | HR 72 | Resp 18

## 2015-11-18 DIAGNOSIS — R197 Diarrhea, unspecified: Secondary | ICD-10-CM

## 2015-11-18 MED ORDER — HEPARIN SOD (PORK) LOCK FLUSH 100 UNIT/ML IV SOLN
500.0000 [IU] | Freq: Once | INTRAVENOUS | Status: AC
  Administered 2015-11-18: 500 [IU] via INTRAVENOUS
  Filled 2015-11-18: qty 5

## 2015-11-18 MED ORDER — SODIUM CHLORIDE 0.9 % IJ SOLN
10.0000 mL | INTRAMUSCULAR | Status: DC | PRN
Start: 2015-11-18 — End: 2015-11-18
  Administered 2015-11-18: 10 mL via INTRAVENOUS
  Filled 2015-11-18: qty 10

## 2015-11-18 MED ORDER — SODIUM CHLORIDE 0.9 % IV SOLN
Freq: Once | INTRAVENOUS | Status: AC
Start: 2015-11-18 — End: 2015-11-18
  Administered 2015-11-18: 10:00:00 via INTRAVENOUS

## 2015-11-18 NOTE — Patient Instructions (Signed)

## 2015-11-19 ENCOUNTER — Other Ambulatory Visit: Payer: Self-pay | Admitting: Hematology & Oncology

## 2015-11-19 ENCOUNTER — Encounter (HOSPITAL_COMMUNITY)
Admission: RE | Admit: 2015-11-19 | Discharge: 2015-11-19 | Disposition: A | Discharge status: Home / Self Care | Payer: 59 | Source: Ambulatory Visit | Attending: Family | Admitting: Family

## 2015-11-19 ENCOUNTER — Ambulatory Visit: Payer: 59

## 2015-11-19 DIAGNOSIS — R64 Cachexia: Secondary | ICD-10-CM | POA: Diagnosis present

## 2015-11-19 DIAGNOSIS — R102 Pelvic and perineal pain: Secondary | ICD-10-CM

## 2015-11-19 DIAGNOSIS — C159 Malignant neoplasm of esophagus, unspecified: Secondary | ICD-10-CM | POA: Diagnosis not present

## 2015-11-19 DIAGNOSIS — G5790 Unspecified mononeuropathy of unspecified lower limb: Secondary | ICD-10-CM | POA: Insufficient documentation

## 2015-11-19 LAB — GLUCOSE, CAPILLARY: Glucose-Capillary: 82 mg/dL (ref 65–99)

## 2015-11-19 MED ORDER — FLUDEOXYGLUCOSE F - 18 (FDG) INJECTION
10.3000 | Freq: Once | INTRAVENOUS | Status: DC | PRN
  Administered 2015-11-19: 10.3 via INTRAVENOUS
  Filled 2015-11-19: qty 10.3

## 2015-11-21 ENCOUNTER — Telehealth: Payer: Self-pay | Admitting: Family

## 2015-11-21 ENCOUNTER — Ambulatory Visit: Payer: 59

## 2015-11-21 NOTE — Telephone Encounter (Signed)
Spoke with Karolee Stamps and went over [name] PET scan results. All questions were answered and we will plan to see him on Monday for his scheduled appointment.

## 2015-11-25 ENCOUNTER — Ambulatory Visit (HOSPITAL_BASED_OUTPATIENT_CLINIC_OR_DEPARTMENT_OTHER): Payer: 59 | Admitting: Hematology & Oncology

## 2015-11-25 ENCOUNTER — Ambulatory Visit (HOSPITAL_BASED_OUTPATIENT_CLINIC_OR_DEPARTMENT_OTHER): Payer: 59

## 2015-11-25 ENCOUNTER — Other Ambulatory Visit (HOSPITAL_BASED_OUTPATIENT_CLINIC_OR_DEPARTMENT_OTHER): Payer: 59

## 2015-11-25 VITALS — BP 126/68 | HR 72 | Temp 97.3°F | Resp 18 | Wt 176.0 lb

## 2015-11-25 DIAGNOSIS — C155 Malignant neoplasm of lower third of esophagus: Secondary | ICD-10-CM

## 2015-11-25 DIAGNOSIS — R102 Pelvic and perineal pain unspecified side: Secondary | ICD-10-CM

## 2015-11-25 DIAGNOSIS — C159 Malignant neoplasm of esophagus, unspecified: Secondary | ICD-10-CM

## 2015-11-25 DIAGNOSIS — R64 Cachexia: Secondary | ICD-10-CM

## 2015-11-25 DIAGNOSIS — Z5111 Encounter for antineoplastic chemotherapy: Secondary | ICD-10-CM

## 2015-11-25 LAB — CBC WITH DIFFERENTIAL (CANCER CENTER ONLY)
BASO#: 0.1 10*3/uL (ref 0.0–0.2)
BASO%: 1.2 % (ref 0.0–2.0)
EOS%: 13.3 % — AB (ref 0.0–7.0)
Eosinophils Absolute: 1.1 10*3/uL — ABNORMAL HIGH (ref 0.0–0.5)
HEMATOCRIT: 37.4 % — AB (ref 38.7–49.9)
HEMOGLOBIN: 12.3 g/dL — AB (ref 13.0–17.1)
LYMPH#: 1.7 10*3/uL (ref 0.9–3.3)
LYMPH%: 20.1 % (ref 14.0–48.0)
MCH: 31.6 pg (ref 28.0–33.4)
MCHC: 32.9 g/dL (ref 32.0–35.9)
MCV: 96 fL (ref 82–98)
MONO#: 1.1 10*3/uL — ABNORMAL HIGH (ref 0.1–0.9)
MONO%: 13.2 % — ABNORMAL HIGH (ref 0.0–13.0)
NEUT#: 4.5 10*3/uL (ref 1.5–6.5)
NEUT%: 52.2 % (ref 40.0–80.0)
Platelets: 296 10*3/uL (ref 145–400)
RBC: 3.89 10*6/uL — ABNORMAL LOW (ref 4.20–5.70)
RDW: 16.4 % — AB (ref 11.1–15.7)
WBC: 8.6 10*3/uL (ref 4.0–10.0)

## 2015-11-25 LAB — CMP (CANCER CENTER ONLY)
ALBUMIN: 3.2 g/dL — AB (ref 3.3–5.5)
ALK PHOS: 80 U/L (ref 26–84)
ALT: 13 U/L (ref 10–47)
AST: 22 U/L (ref 11–38)
BUN: 17 mg/dL (ref 7–22)
CHLORIDE: 93 meq/L — AB (ref 98–108)
CO2: 28 mEq/L (ref 18–33)
CREATININE: 1.3 mg/dL — AB (ref 0.6–1.2)
Calcium: 9.8 mg/dL (ref 8.0–10.3)
Glucose, Bld: 102 mg/dL (ref 73–118)
POTASSIUM: 3.7 meq/L (ref 3.3–4.7)
SODIUM: 139 meq/L (ref 128–145)
TOTAL PROTEIN: 7.2 g/dL (ref 6.4–8.1)
Total Bilirubin: 0.7 mg/dl (ref 0.20–1.60)

## 2015-11-25 LAB — LACTATE DEHYDROGENASE (CC13): LDH: 164 U/L (ref 125–245)

## 2015-11-25 MED ORDER — FLUOROURACIL CHEMO INJECTION 5 GM/100ML
1980.0000 mg/m2 | INTRAVENOUS | Status: DC
  Administered 2015-11-25: 4450 mg via INTRAVENOUS
  Filled 2015-11-25: qty 89

## 2015-11-25 MED ORDER — FLUOROURACIL CHEMO INJECTION 2.5 GM/50ML
400.0000 mg/m2 | Freq: Once | INTRAVENOUS | Status: AC
  Administered 2015-11-25: 900 mg via INTRAVENOUS
  Filled 2015-11-25: qty 18

## 2015-11-25 MED ORDER — ATROPINE SULFATE 1 MG/ML IJ SOLN
0.5000 mg | Freq: Once | INTRAMUSCULAR | Status: AC | PRN
  Administered 2015-11-25: 0.5 mg via INTRAVENOUS

## 2015-11-25 MED ORDER — ATROPINE SULFATE 1 MG/ML IJ SOLN
INTRAMUSCULAR | Status: AC
  Filled 2015-11-25: qty 1

## 2015-11-25 MED ORDER — SODIUM CHLORIDE 0.9 % IV SOLN
Freq: Once | INTRAVENOUS | Status: AC
  Administered 2015-11-25: 10:00:00 via INTRAVENOUS

## 2015-11-25 MED ORDER — IRINOTECAN HCL CHEMO INJECTION 100 MG/5ML
133.0000 mg/m2 | Freq: Once | INTRAVENOUS | Status: AC
  Administered 2015-11-25: 300 mg via INTRAVENOUS
  Filled 2015-11-25: qty 5

## 2015-11-25 MED ORDER — SODIUM CHLORIDE 0.9 % IV SOLN
Freq: Once | INTRAVENOUS | Status: AC
  Administered 2015-11-25: 10:00:00 via INTRAVENOUS
  Filled 2015-11-25: qty 8

## 2015-11-25 MED ORDER — LEUCOVORIN CALCIUM INJECTION 350 MG
400.0000 mg/m2 | Freq: Once | INTRAVENOUS | Status: AC
  Administered 2015-11-25: 900 mg via INTRAVENOUS
  Filled 2015-11-25: qty 45

## 2015-11-25 NOTE — Progress Notes (Signed)
Hematology and Oncology Follow Up Visit  Willie Owens [id number] 05-29-1951 64 y.o. 11/25/2015   Principle Diagnosis:  Recurrent adenocarcinoma of the distal esophagus - HER2 negative  Current Therapy:   Status post cycle 10 of FOLFIRI    Interim History:  Willie Owens is here today with his wife for follow-up. He is doing better. He did have one episode of falling. He fell onto his right side. He hurt one of his ribs.  Thankfully, his last PET scan showed a continues to respond. The PET scan shows decreased activity indicative of a response.  His appetite is improved he well. He had a very nice things giving. He's had no issues with nausea vomiting. He's had no issues with rashes.  He does have diarrhea. This seems to be under better control.  He's had no mouth sores.  His memory issues seem to be doing better.  Overall, his performance status is ECOG 1.   Medications:    Medication List       This list is accurate as of: 11/25/15 10:56 AM.  Always use your most recent med list.               ALIGN 4 MG Caps  Take 1 capsule by mouth daily with breakfast.     aspirin 81 MG chewable tablet  Chew 1 tablet (81 mg total) by mouth daily.     benzonatate 100 MG capsule  Commonly known as:  TESSALON PERLES  Take 1 capsule (100 mg total) by mouth 3 (three) times daily as needed for cough.     bismuth subsalicylate 419 FX/90WI suspension  Commonly known as:  PEPTO BISMOL  Take 30 mLs by mouth every 6 (six) hours as needed for diarrhea or loose stools.     cetirizine 10 MG tablet  Commonly known as:  ZYRTEC  Take 10 mg by mouth daily as needed for allergies.     diphenhydrAMINE 25 MG tablet  Commonly known as:  BENADRYL  Take 25 mg by mouth every 6 (six) hours as needed for allergies.     diphenoxylate-atropine 2.5-0.025 MG tablet  Commonly known as:  LOMOTIL  TAKE 1 TABLET BY MOUTH FOUR TIMES DAILY AS NEEDED FOR DIARRHEA     dronabinol 5 MG capsule    Commonly known as:  MARINOL  Take 1 capsule (5 mg total) by mouth 2 (two) times daily before a meal.     lidocaine-prilocaine cream  Commonly known as:  EMLA  APPLY TO THE AFFECTED AREA AS NEEDED     lipase/protease/amylase 36000 UNITS Cpep capsule  Commonly known as:  CREON  Take 1 capsule (36,000 Units total) by mouth 3 (three) times daily before meals.     loperamide 2 MG tablet  Commonly known as:  IMODIUM A-D  Take 2 at onset of diarrhea, then 1 every 2hrs until 12hr without a BM. May take 2 tab every 4hrs at bedtime. If diarrhea recurs repeat.     MAGNESIUM-OXIDE 400 (241.3 MG) MG tablet  Generic drug:  magnesium oxide  TK 1 T PO BID     multivitamin with minerals Tabs tablet  Take 1 tablet by mouth daily.     omeprazole 40 MG capsule  Commonly known as:  PRILOSEC  Take 1 capsule (40 mg total) by mouth daily.     ondansetron 4 MG disintegrating tablet  Commonly known as:  ZOFRAN ODT  Take 1 tablet (4 mg total) by mouth every 8 (eight) hours  as needed for nausea or vomiting.     ondansetron 8 MG tablet  Commonly known as:  ZOFRAN  Take 1 tablet (8 mg total) by mouth 2 (two) times daily. Start the day after chemo for 3 days. Then take as needed for nausea or vomiting.     pegfilgrastim 6 MG/0.6ML injection  Commonly known as:  NEULASTA  Inject 6 mg into the skin once.     potassium chloride SA 20 MEQ tablet  Commonly known as:  K-DUR,KLOR-CON  Take 2 tablets (40 mEq total) by mouth daily.     PRESCRIPTION MEDICATION  Chemo - [facility]     prochlorperazine 10 MG tablet  Commonly known as:  COMPAZINE  Take 10 mg by mouth every 6 (six) hours as needed for nausea or vomiting.     propranolol ER 60 MG 24 hr capsule  Commonly known as:  INDERAL LA  TAKE 1 CAPSULE BY MOUTH AT BEDTIME     pyridOXINE 100 MG tablet  Commonly known as:  VITAMIN B-6  Take 2 pills at one time once a day.     SUPER B COMPLEX PO  Take 1 tablet by mouth daily.     traZODone 50 MG tablet   Commonly known as:  DESYREL  Take 1 tablet (50 mg total) by mouth at bedtime.     venlafaxine 37.5 MG tablet  Commonly known as:  EFFEXOR  TAKE 1 TABLET BY MOUTH TWICE DAILY WITH A MEAL     venlafaxine 37.5 MG tablet  Commonly known as:  EFFEXOR     Vitamin D 2000 UNITS tablet  Take 2,000 Units by mouth daily.        Allergies:  Allergies  Allergen Reactions  . Penicillins Other (See Comments)    Hallucinations, from childhood    Past Medical History, Surgical history, Social history, and Family History were reviewed and updated.  Review of Systems: All other 10 point review of systems is negative.   Physical Exam:  weight is 176 lb (79.833 kg). His oral temperature is 97.3 F (36.3 C). His blood pressure is 126/68 and his pulse is 72. His respiration is 18.   Wt Readings from Last 3 Encounters:  11/25/15 176 lb (79.833 kg)  11/05/15 173 lb (78.472 kg)  10/08/15 185 lb (83.915 kg)    Ocular: Sclerae unicteric, pupils equal, round and reactive to light Ear-nose-throat: Oropharynx clear, dentition fair Lymphatic: No cervical or supraclavicular adenopathy Lungs no rales or rhonchi, good excursion bilaterally Heart regular rate and rhythm, no murmur appreciated Abd soft, nontender, positive bowel sounds MSK no focal spinal tenderness, no joint edema Neuro: non-focal, well-oriented, appropriate affect Breasts: Deferred  Lab Results  Component Value Date   WBC 8.6 11/25/2015   HGB 12.3* 11/25/2015   HCT 37.4* 11/25/2015   MCV 96 11/25/2015   PLT 296 11/25/2015   Lab Results  Component Value Date   FERRITIN 35 10/03/2014   IRON 40* 10/03/2014   TIBC 279 10/03/2014   UIBC 239 10/03/2014   IRONPCTSAT 14* 10/03/2014   Lab Results  Component Value Date   RBC 3.89* 11/25/2015   No results found for: KPAFRELGTCHN, LAMBDASER, KAPLAMBRATIO No results found for: IGGSERUM, IGA, IGMSERUM No results found for: Ronnald Ramp, A1GS, A2GS, Violet Baldy, MSPIKE, SPEI   Chemistry      Component Value Date/Time   NA 139 11/25/2015 0852   NA 133* 08/18/2015 1550   K 3.7 11/25/2015 0852   K 4.6  08/18/2015 1550   CL 93* 11/25/2015 0852   CL 101 08/18/2015 1550   CO2 28 11/25/2015 0852   CO2 24 08/18/2015 1550   BUN 17 11/25/2015 0852   BUN 14 08/18/2015 1550   CREATININE 1.3* 11/25/2015 0852   CREATININE 1.32* 08/18/2015 1550      Component Value Date/Time   CALCIUM 9.8 11/25/2015 0852   CALCIUM 8.3* 08/18/2015 1550   ALKPHOS 80 11/25/2015 0852   ALKPHOS 41 08/18/2015 1550   AST 22 11/25/2015 0852   AST 22 08/18/2015 1550   ALT 13 11/25/2015 0852   ALT 14* 08/18/2015 1550   BILITOT 0.70 11/25/2015 0852   BILITOT 0.5 08/18/2015 1550     Impression and Plan: Willie Owens is 64 yo white male with metastatic adenocarcinoma of the distal esophagus. I am quite happy that he sees be doing better. His weight is up a little bit.  For now, we'll continue him on therapy. He's tolerating therapy pretty well.  Complaining of back in 2 weeks. We'll then see about getting them through Christmas without any treatment so he can enjoy the Christmas holiday.    [name], MD 11/29/201610:56 AM

## 2015-11-25 NOTE — Patient Instructions (Signed)
[facility] Discharge Instructions for Patients Receiving Chemotherapy  Today you received the following chemotherapy agents Camptosar and Leucovorin.  To help prevent nausea and vomiting after your treatment, we encourage you to take your nausea medication as prescribed.  If you develop nausea and vomiting that is not controlled by your nausea medication, call the clinic.   BELOW ARE SYMPTOMS THAT SHOULD BE REPORTED IMMEDIATELY:  *FEVER GREATER THAN 100.5 F  *CHILLS WITH OR WITHOUT FEVER  NAUSEA AND VOMITING THAT IS NOT CONTROLLED WITH YOUR NAUSEA MEDICATION  *UNUSUAL SHORTNESS OF BREATH  *UNUSUAL BRUISING OR BLEEDING  TENDERNESS IN MOUTH AND THROAT WITH OR WITHOUT PRESENCE OF ULCERS  *URINARY PROBLEMS  *BOWEL PROBLEMS  UNUSUAL RASH Items with * indicate a potential emergency and should be followed up as soon as possible.  Feel free to call the clinic you have any questions or concerns. The clinic phone number is [phone].  Please show the [facility] at check-in to the Emergency Department and triage nurse.

## 2015-11-26 ENCOUNTER — Ambulatory Visit: Payer: 59

## 2015-11-26 ENCOUNTER — Ambulatory Visit: Payer: 59 | Admitting: Hematology & Oncology

## 2015-11-26 ENCOUNTER — Other Ambulatory Visit: Payer: 59

## 2015-11-27 ENCOUNTER — Ambulatory Visit (HOSPITAL_BASED_OUTPATIENT_CLINIC_OR_DEPARTMENT_OTHER): Payer: 59

## 2015-11-27 ENCOUNTER — Ambulatory Visit: Payer: 59

## 2015-11-27 VITALS — BP 99/64 | HR 70 | Temp 98.2°F | Resp 18

## 2015-11-27 DIAGNOSIS — C155 Malignant neoplasm of lower third of esophagus: Secondary | ICD-10-CM | POA: Diagnosis not present

## 2015-11-27 DIAGNOSIS — Z5189 Encounter for other specified aftercare: Secondary | ICD-10-CM

## 2015-11-27 MED ORDER — HEPARIN SOD (PORK) LOCK FLUSH 100 UNIT/ML IV SOLN
500.0000 [IU] | Freq: Once | INTRAVENOUS | Status: AC | PRN
  Administered 2015-11-27: 500 [IU]
  Filled 2015-11-27: qty 5

## 2015-11-27 MED ORDER — PEGFILGRASTIM INJECTION 6 MG/0.6ML ~~LOC~~
PREFILLED_SYRINGE | SUBCUTANEOUS | Status: AC
  Filled 2015-11-27: qty 0.6

## 2015-11-27 MED ORDER — PEGFILGRASTIM INJECTION 6 MG/0.6ML ~~LOC~~
6.0000 mg | PREFILLED_SYRINGE | Freq: Once | SUBCUTANEOUS | Status: AC
  Administered 2015-11-27: 6 mg via SUBCUTANEOUS

## 2015-11-27 MED ORDER — SODIUM CHLORIDE 0.9 % IJ SOLN
10.0000 mL | INTRAMUSCULAR | Status: DC | PRN
  Administered 2015-11-27: 10 mL
  Filled 2015-11-27: qty 10

## 2015-11-27 NOTE — Patient Instructions (Signed)
Pegfilgrastim injection What is this medicine? PEGFILGRASTIM (PEG fil gra stim) is a long-acting granulocyte colony-stimulating factor that stimulates the growth of neutrophils, a type of white blood cell important in the body's fight against infection. It is used to reduce the incidence of fever and infection in patients with certain types of cancer who are receiving chemotherapy that affects the bone marrow, and to increase survival after being exposed to high doses of radiation. This medicine may be used for other purposes; ask your health care provider or pharmacist if you have questions. What should I tell my health care provider before I take this medicine? They need to know if you have any of these conditions: -kidney disease -latex allergy -ongoing radiation therapy -sickle cell disease -skin reactions to acrylic adhesives (On-Body Injector only) -an unusual or allergic reaction to pegfilgrastim, filgrastim, other medicines, foods, dyes, or preservatives -pregnant or trying to get pregnant -breast-feeding How should I use this medicine? This medicine is for injection under the skin. If you get this medicine at home, you will be taught how to prepare and give the pre-filled syringe or how to use the On-body Injector. Refer to the patient Instructions for Use for detailed instructions. Use exactly as directed. Take your medicine at regular intervals. Do not take your medicine more often than directed. It is important that you put your used needles and syringes in a special sharps container. Do not put them in a trash can. If you do not have a sharps container, call your pharmacist or healthcare provider to get one. Talk to your pediatrician regarding the use of this medicine in children. While this drug may be prescribed for selected conditions, precautions do apply. Overdosage: If you think you have taken too much of this medicine contact a poison control center or emergency room at  once. NOTE: This medicine is only for you. Do not share this medicine with others. What if I miss a dose? It is important not to miss your dose. Call your doctor or health care professional if you miss your dose. If you miss a dose due to an On-body Injector failure or leakage, a new dose should be administered as soon as possible using a single prefilled syringe for manual use. What may interact with this medicine? Interactions have not been studied. Give your health care provider a list of all the medicines, herbs, non-prescription drugs, or dietary supplements you use. Also tell them if you smoke, drink alcohol, or use illegal drugs. Some items may interact with your medicine. This list may not describe all possible interactions. Give your health care provider a list of all the medicines, herbs, non-prescription drugs, or dietary supplements you use. Also tell them if you smoke, drink alcohol, or use illegal drugs. Some items may interact with your medicine. What should I watch for while using this medicine? You may need blood work done while you are taking this medicine. If you are going to need a MRI, CT scan, or other procedure, tell your doctor that you are using this medicine (On-Body Injector only). What side effects may I notice from receiving this medicine? Side effects that you should report to your doctor or health care professional as soon as possible: -allergic reactions like skin rash, itching or hives, swelling of the face, lips, or tongue -dizziness -fever -pain, redness, or irritation at site where injected -pinpoint red spots on the skin -red or dark-brown urine -shortness of breath or breathing problems -stomach or side pain, or pain   at the shoulder -swelling -tiredness -trouble passing urine or change in the amount of urine Side effects that usually do not require medical attention (report to your doctor or health care professional if they continue or are  bothersome): -bone pain -muscle pain This list may not describe all possible side effects. Call your doctor for medical advice about side effects. You may report side effects to FDA at 1-800-FDA-1088. Where should I keep my medicine? Keep out of the reach of children. Store pre-filled syringes in a refrigerator between 2 and 8 degrees C (36 and 46 degrees F). Do not freeze. Keep in carton to protect from light. Throw away this medicine if it is left out of the refrigerator for more than 48 hours. Throw away any unused medicine after the expiration date. NOTE: This sheet is a summary. It may not cover all possible information. If you have questions about this medicine, talk to your doctor, pharmacist, or health care provider.    2016, Elsevier/Gold Standard. (2015-01-02 14:30:14)  

## 2015-12-04 ENCOUNTER — Telehealth: Payer: Self-pay | Admitting: Hematology & Oncology

## 2015-12-04 ENCOUNTER — Ambulatory Visit (HOSPITAL_BASED_OUTPATIENT_CLINIC_OR_DEPARTMENT_OTHER): Payer: 59

## 2015-12-04 ENCOUNTER — Other Ambulatory Visit: Payer: Self-pay | Admitting: Hematology & Oncology

## 2015-12-04 VITALS — BP 127/54 | HR 63 | Temp 98.2°F | Resp 18

## 2015-12-04 DIAGNOSIS — C158 Malignant neoplasm of overlapping sites of esophagus: Secondary | ICD-10-CM

## 2015-12-04 MED ORDER — SODIUM CHLORIDE 0.9 % IV SOLN
1000.0000 mL | INTRAVENOUS | Status: DC
  Administered 2015-12-04: 11:00:00 via INTRAVENOUS

## 2015-12-04 NOTE — Patient Instructions (Signed)

## 2015-12-04 NOTE — Telephone Encounter (Signed)
Patient's wife was here in office and she stated to cx all appts in December and they will start fresh next year.  appt was cx and resch for 12/30/15

## 2015-12-09 ENCOUNTER — Other Ambulatory Visit: Payer: 59

## 2015-12-09 ENCOUNTER — Ambulatory Visit: Payer: 59

## 2015-12-09 ENCOUNTER — Ambulatory Visit: Payer: 59 | Admitting: Family

## 2015-12-16 ENCOUNTER — Ambulatory Visit: Payer: 59

## 2015-12-17 ENCOUNTER — Other Ambulatory Visit: Payer: Self-pay

## 2015-12-17 DIAGNOSIS — C158 Malignant neoplasm of overlapping sites of esophagus: Secondary | ICD-10-CM

## 2015-12-19 ENCOUNTER — Other Ambulatory Visit: Payer: Self-pay | Admitting: Hematology & Oncology

## 2015-12-23 ENCOUNTER — Other Ambulatory Visit: Payer: Self-pay | Admitting: Hematology & Oncology

## 2015-12-27 ENCOUNTER — Other Ambulatory Visit: Payer: Self-pay | Admitting: Hematology & Oncology

## 2015-12-30 ENCOUNTER — Encounter: Payer: Self-pay | Admitting: Family

## 2015-12-30 ENCOUNTER — Ambulatory Visit (HOSPITAL_BASED_OUTPATIENT_CLINIC_OR_DEPARTMENT_OTHER): Payer: BLUE CROSS/BLUE SHIELD | Admitting: Family

## 2015-12-30 ENCOUNTER — Other Ambulatory Visit (HOSPITAL_BASED_OUTPATIENT_CLINIC_OR_DEPARTMENT_OTHER): Payer: BLUE CROSS/BLUE SHIELD

## 2015-12-30 ENCOUNTER — Telehealth: Payer: Self-pay | Admitting: Hematology & Oncology

## 2015-12-30 ENCOUNTER — Ambulatory Visit (HOSPITAL_BASED_OUTPATIENT_CLINIC_OR_DEPARTMENT_OTHER): Payer: BLUE CROSS/BLUE SHIELD

## 2015-12-30 VITALS — BP 131/88 | HR 68 | Resp 16 | Ht 78.0 in | Wt 181.0 lb

## 2015-12-30 DIAGNOSIS — C155 Malignant neoplasm of lower third of esophagus: Secondary | ICD-10-CM

## 2015-12-30 DIAGNOSIS — Z5111 Encounter for antineoplastic chemotherapy: Secondary | ICD-10-CM | POA: Diagnosis not present

## 2015-12-30 DIAGNOSIS — C158 Malignant neoplasm of overlapping sites of esophagus: Secondary | ICD-10-CM

## 2015-12-30 LAB — CMP (CANCER CENTER ONLY)
ALBUMIN: 3.2 g/dL — AB (ref 3.3–5.5)
ALT(SGPT): 13 U/L (ref 10–47)
AST: 26 U/L (ref 11–38)
Alkaline Phosphatase: 79 U/L (ref 26–84)
BILIRUBIN TOTAL: 0.8 mg/dL (ref 0.20–1.60)
BUN, Bld: 18 mg/dL (ref 7–22)
CALCIUM: 8.9 mg/dL (ref 8.0–10.3)
CHLORIDE: 99 meq/L (ref 98–108)
CO2: 25 meq/L (ref 18–33)
Creat: 0.9 mg/dl (ref 0.6–1.2)
GLUCOSE: 134 mg/dL — AB (ref 73–118)
POTASSIUM: 3.6 meq/L (ref 3.3–4.7)
Sodium: 136 mEq/L (ref 128–145)
Total Protein: 6.7 g/dL (ref 6.4–8.1)

## 2015-12-30 LAB — CBC WITH DIFFERENTIAL (CANCER CENTER ONLY)
BASO#: 0 10*3/uL (ref 0.0–0.2)
BASO%: 0.5 % (ref 0.0–2.0)
EOS ABS: 0.4 10*3/uL (ref 0.0–0.5)
EOS%: 4.6 % (ref 0.0–7.0)
HEMATOCRIT: 38 % — AB (ref 38.7–49.9)
HGB: 12.1 g/dL — ABNORMAL LOW (ref 13.0–17.1)
LYMPH#: 1.9 10*3/uL (ref 0.9–3.3)
LYMPH%: 23.4 % (ref 14.0–48.0)
MCH: 30.6 pg (ref 28.0–33.4)
MCHC: 31.8 g/dL — AB (ref 32.0–35.9)
MCV: 96 fL (ref 82–98)
MONO#: 0.8 10*3/uL (ref 0.1–0.9)
MONO%: 9.6 % (ref 0.0–13.0)
NEUT#: 5 10*3/uL (ref 1.5–6.5)
NEUT%: 61.9 % (ref 40.0–80.0)
PLATELETS: 295 10*3/uL (ref 145–400)
RBC: 3.96 10*6/uL — ABNORMAL LOW (ref 4.20–5.70)
RDW: 16.2 % — AB (ref 11.1–15.7)
WBC: 8 10*3/uL (ref 4.0–10.0)

## 2015-12-30 LAB — LACTATE DEHYDROGENASE: LDH: 170 U/L (ref 125–245)

## 2015-12-30 MED ORDER — IRINOTECAN HCL CHEMO INJECTION 100 MG/5ML
133.0000 mg/m2 | Freq: Once | INTRAVENOUS | Status: AC
  Administered 2015-12-30: 300 mg via INTRAVENOUS
  Filled 2015-12-30: qty 5

## 2015-12-30 MED ORDER — DEXAMETHASONE SODIUM PHOSPHATE 100 MG/10ML IJ SOLN
Freq: Once | INTRAMUSCULAR | Status: AC
  Administered 2015-12-30: 10:00:00 via INTRAVENOUS
  Filled 2015-12-30: qty 8

## 2015-12-30 MED ORDER — SODIUM CHLORIDE 0.9 % IV SOLN
Freq: Once | INTRAVENOUS | Status: AC
  Administered 2015-12-30: 09:00:00 via INTRAVENOUS

## 2015-12-30 MED ORDER — ATROPINE SULFATE 1 MG/ML IJ SOLN
0.5000 mg | Freq: Once | INTRAMUSCULAR | Status: AC | PRN
  Administered 2015-12-30: 0.5 mg via INTRAVENOUS

## 2015-12-30 MED ORDER — SODIUM CHLORIDE 0.9 % IV SOLN
1980.0000 mg/m2 | INTRAVENOUS | Status: DC
  Administered 2015-12-30: 4450 mg via INTRAVENOUS
  Filled 2015-12-30: qty 89

## 2015-12-30 MED ORDER — ATROPINE SULFATE 1 MG/ML IJ SOLN
INTRAMUSCULAR | Status: AC
  Filled 2015-12-30: qty 1

## 2015-12-30 MED ORDER — LEUCOVORIN CALCIUM INJECTION 100 MG
20.0000 mg/m2 | Freq: Once | INTRAMUSCULAR | Status: AC
  Administered 2015-12-30: 46 mg via INTRAVENOUS
  Filled 2015-12-30: qty 2.3

## 2015-12-30 MED ORDER — HEPARIN SOD (PORK) LOCK FLUSH 100 UNIT/ML IV SOLN
500.0000 [IU] | Freq: Once | INTRAVENOUS | Status: DC | PRN
  Filled 2015-12-30: qty 5

## 2015-12-30 MED ORDER — FLUOROURACIL CHEMO INJECTION 2.5 GM/50ML
400.0000 mg/m2 | Freq: Once | INTRAVENOUS | Status: AC
  Administered 2015-12-30: 900 mg via INTRAVENOUS
  Filled 2015-12-30: qty 18

## 2015-12-30 MED ORDER — SODIUM CHLORIDE 0.9 % IJ SOLN
10.0000 mL | INTRAMUSCULAR | Status: DC | PRN
  Filled 2015-12-30: qty 10

## 2015-12-30 NOTE — Telephone Encounter (Addendum)
I spoke w Bea Graff today @ [facility] and he advised [facility] for codes below. However, he showed pt's plan is INACTIVE. I told him that, pt brought a card in that has efc: 12/28/2015.  Marjory Lies, still could not pull up an [facility] for this member and advised, the pt will need to call into member services at this time.  Ref: [id number]    [id number] [place] [id number]     P: [id number]

## 2015-12-30 NOTE — Patient Instructions (Signed)
[facility] Discharge Instructions for Patients Receiving Chemotherapy  Today you received the following chemotherapy agents Camptosar and Leucovorin.  To help prevent nausea and vomiting after your treatment, we encourage you to take your nausea medication as prescribed.  If you develop nausea and vomiting that is not controlled by your nausea medication, call the clinic.   BELOW ARE SYMPTOMS THAT SHOULD BE REPORTED IMMEDIATELY:  *FEVER GREATER THAN 100.5 F  *CHILLS WITH OR WITHOUT FEVER  NAUSEA AND VOMITING THAT IS NOT CONTROLLED WITH YOUR NAUSEA MEDICATION  *UNUSUAL SHORTNESS OF BREATH  *UNUSUAL BRUISING OR BLEEDING  TENDERNESS IN MOUTH AND THROAT WITH OR WITHOUT PRESENCE OF ULCERS  *URINARY PROBLEMS  *BOWEL PROBLEMS  UNUSUAL RASH Items with * indicate a potential emergency and should be followed up as soon as possible.  Feel free to call the clinic you have any questions or concerns. The clinic phone number is [phone].  Please show the [facility] at check-in to the Emergency Department and triage nurse.

## 2015-12-30 NOTE — Progress Notes (Signed)
Hematology and Oncology Follow Up Visit  Willie Owens [id number] 1951-03-11 65 y.o. 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  He is doing well and has no complaints at this time. He had a wonderful Christmas and new years with his family. They went to [place] and [place] and really had a great time.  His PEt scan in November showed a continued response to treatment.  His appetite has improved and he is staying well hydrated. No sore throat or difficulty swallowing. His weight is back up 5 lbs.  No fever, chills, n/v, cough, rash, dizziness, headaches, SOB, chest pain, palpitations, abdominal pain or changes in bowel or bladder habits.  The numbness and tingling in his hands and feet is slightly worse. He is ambulating with a cane and has had no recent falls. They have ordered him a wheelchair so that he can get out more. He becomes tired easily walking a good distance. No swelling or tenderness in his extremities.   Medications:    Medication List       This list is accurate as of: 12/30/15  9:27 AM.  Always use your most recent med list.               ALIGN 4 MG Caps  Take 1 capsule by mouth daily with breakfast.     aspirin 81 MG chewable tablet  Chew 1 tablet (81 mg total) by mouth daily.     benzonatate 100 MG capsule  Commonly known as:  TESSALON PERLES  Take 1 capsule (100 mg total) by mouth 3 (three) times daily as needed for cough.     bismuth subsalicylate 701 XB/93JQ suspension  Commonly known as:  PEPTO BISMOL  Take 30 mLs by mouth every 6 (six) hours as needed for diarrhea or loose stools.     cetirizine 10 MG tablet  Commonly known as:  ZYRTEC  Take 10 mg by mouth daily as needed for allergies.     diphenhydrAMINE 25 MG tablet  Commonly  known as:  BENADRYL  Take 25 mg by mouth every 6 (six) hours as needed for allergies.     diphenoxylate-atropine 2.5-0.025 MG tablet  Commonly known as:  LOMOTIL  TAKE 1 TABLET BY MOUTH FOUR TIMES DAILY AS NEEDED FOR DIARRHEA     dronabinol 5 MG capsule  Commonly known as:  MARINOL  Take 1 capsule (5 mg total) by mouth 2 (two) times daily before a meal.     lidocaine-prilocaine cream  Commonly known as:  EMLA  APPLY TO THE AFFECTED AREA AS NEEDED     lipase/protease/amylase 36000 UNITS Cpep capsule  Commonly known as:  CREON  Take 1 capsule (36,000 Units total) by mouth 3 (three) times daily before meals.     loperamide 2 MG tablet  Commonly known as:  IMODIUM A-D  Take 2 at onset of diarrhea, then 1 every 2hrs until 12hr without a BM. May take 2 tab every 4hrs at bedtime. If diarrhea recurs repeat.     MAGNESIUM-OXIDE 400 (241.3 Mg) MG tablet  Generic drug:  magnesium oxide  TK 1 T PO BID     multivitamin with minerals Tabs tablet  Take 1 tablet by mouth daily.     omeprazole 40 MG  capsule  Commonly known as:  PRILOSEC  TAKE 1 CAPSULE BY MOUTH EVERY DAY     ondansetron 4 MG disintegrating tablet  Commonly known as:  ZOFRAN ODT  Take 1 tablet (4 mg total) by mouth every 8 (eight) hours as needed for nausea or vomiting.     ondansetron 8 MG tablet  Commonly known as:  ZOFRAN  Take 1 tablet (8 mg total) by mouth 2 (two) times daily. Start the day after chemo for 3 days. Then take as needed for nausea or vomiting.     pegfilgrastim 6 MG/0.6ML injection  Commonly known as:  NEULASTA  Inject 6 mg into the skin once.     potassium chloride SA 20 MEQ tablet  Commonly known as:  K-DUR,KLOR-CON  TAKE 2 TABLETS(40 MEQ) BY MOUTH DAILY     PRESCRIPTION MEDICATION  Chemo - [facility]     prochlorperazine 10 MG tablet  Commonly known as:  COMPAZINE  Take 10 mg by mouth every 6 (six) hours as needed for nausea or vomiting.     propranolol ER 60 MG 24 hr capsule  Commonly known  as:  INDERAL LA  TAKE 1 CAPSULE BY MOUTH AT BEDTIME     pyridOXINE 100 MG tablet  Commonly known as:  VITAMIN B-6  Take 2 pills at one time once a day.     SUPER B COMPLEX PO  Take 1 tablet by mouth daily.     traZODone 50 MG tablet  Commonly known as:  DESYREL  Take 1 tablet (50 mg total) by mouth at bedtime.     venlafaxine 37.5 MG tablet  Commonly known as:  EFFEXOR     venlafaxine 37.5 MG tablet  Commonly known as:  EFFEXOR  TAKE 1 TABLET BY MOUTH TWICE DAILY WITH A MEAL     Vitamin D 2000 units tablet  Take 2,000 Units by mouth daily.        Allergies:  Allergies  Allergen Reactions  . Penicillins Other (See Comments)    Hallucinations, from childhood    Past Medical History, Surgical history, Social history, and Family History were reviewed and updated.  Review of Systems: All other 10 point review of systems is negative.   Physical Exam:  height is _0  (1.981 m) and weight is 181 lb (82.101 kg). His blood pressure is 131/88 and his pulse is 68. His respiration is 16.   Wt Readings from Last 3 Encounters:  12/30/15 181 lb (82.101 kg)  11/25/15 176 lb (79.833 kg)  11/05/15 173 lb (78.472 kg)    Ocular: Sclerae unicteric, pupils equal, round and reactive to light Ear-nose-throat: Oropharynx clear, dentition fair Lymphatic: No cervical supraclavicular or axillary adenopathy Lungs no rales or rhonchi, good excursion bilaterally Heart regular rate and rhythm, no murmur appreciated Abd soft, nontender, positive bowel sounds, no liver or spleen tip palpated on exam MSK no focal spinal tenderness, no joint edema Neuro: non-focal, well-oriented, appropriate affect Breasts: Deferred  Lab Results  Component Value Date   WBC 8.0 12/30/2015   HGB 12.1* 12/30/2015   HCT 38.0* 12/30/2015   MCV 96 12/30/2015   PLT 295 12/30/2015   Lab Results  Component Value Date   FERRITIN 35 10/03/2014   IRON 40* 10/03/2014   TIBC 279 10/03/2014   UIBC 239 10/03/2014     IRONPCTSAT 14* 10/03/2014   Lab Results  Component Value Date   RBC 3.96* 12/30/2015   No results found for: KPAFRELGTCHN, LAMBDASER, KAPLAMBRATIO No results  found for: Kandis Cocking, IGMSERUM No results found for: Odetta Pink, SPEI   Chemistry      Component Value Date/Time   NA 136 12/30/2015 0812   NA 133* 08/18/2015 1550   K 3.6 12/30/2015 0812   K 4.6 08/18/2015 1550   CL 99 12/30/2015 0812   CL 101 08/18/2015 1550   CO2 25 12/30/2015 0812   CO2 24 08/18/2015 1550   BUN 18 12/30/2015 0812   BUN 14 08/18/2015 1550   CREATININE 0.9 12/30/2015 0812   CREATININE 1.32* 08/18/2015 1550      Component Value Date/Time   CALCIUM 8.9 12/30/2015 0812   CALCIUM 8.3* 08/18/2015 1550   ALKPHOS 79 12/30/2015 0812   ALKPHOS 41 08/18/2015 1550   AST 26 12/30/2015 0812   AST 22 08/18/2015 1550   ALT 13 12/30/2015 0812   ALT 14* 08/18/2015 1550   BILITOT 0.80 12/30/2015 0812   BILITOT 0.5 08/18/2015 1550     Impression and Plan: Willie Owens is 65 yo white male with metastatic adenocarcinoma of the distal esophagus. His PET scan in November showed a continued response to therapy. He is doing well and had a wonderful Christmas with his family. His appetite is improved and his weight is up 5 lbs today.  CBC and CMP today look good. We will proceed with cycle 11 today as planned.  We will plan to repeat a PET scan in February.  We will plan to see him back in 2 weeks for labs and follow-up.  Both he and his wife know to contact us with any questions or concerns. We can certainly see him sooner if need be.   Eliezer Bottom, NP 1/3/20179:27 AM

## 2016-01-01 ENCOUNTER — Ambulatory Visit: Payer: 59 | Admitting: Family

## 2016-01-01 ENCOUNTER — Other Ambulatory Visit: Payer: 59

## 2016-01-01 ENCOUNTER — Ambulatory Visit (HOSPITAL_BASED_OUTPATIENT_CLINIC_OR_DEPARTMENT_OTHER): Payer: BLUE CROSS/BLUE SHIELD

## 2016-01-01 ENCOUNTER — Ambulatory Visit: Payer: 59

## 2016-01-01 VITALS — BP 123/62 | HR 60 | Temp 97.5°F | Resp 18

## 2016-01-01 DIAGNOSIS — C155 Malignant neoplasm of lower third of esophagus: Secondary | ICD-10-CM | POA: Diagnosis not present

## 2016-01-01 DIAGNOSIS — Z5189 Encounter for other specified aftercare: Secondary | ICD-10-CM | POA: Diagnosis not present

## 2016-01-01 MED ORDER — SODIUM CHLORIDE 0.9 % IJ SOLN
10.0000 mL | INTRAMUSCULAR | Status: DC | PRN
  Administered 2016-01-01: 10 mL
  Filled 2016-01-01: qty 10

## 2016-01-01 MED ORDER — HEPARIN SOD (PORK) LOCK FLUSH 100 UNIT/ML IV SOLN
500.0000 [IU] | Freq: Once | INTRAVENOUS | Status: AC | PRN
  Administered 2016-01-01: 500 [IU]
  Filled 2016-01-01: qty 5

## 2016-01-01 MED ORDER — PEGFILGRASTIM INJECTION 6 MG/0.6ML ~~LOC~~
PREFILLED_SYRINGE | SUBCUTANEOUS | Status: AC
  Filled 2016-01-01: qty 0.6

## 2016-01-01 MED ORDER — PEGFILGRASTIM INJECTION 6 MG/0.6ML ~~LOC~~
6.0000 mg | PREFILLED_SYRINGE | Freq: Once | SUBCUTANEOUS | Status: AC
  Administered 2016-01-01: 6 mg via SUBCUTANEOUS

## 2016-01-01 NOTE — Patient Instructions (Signed)
Pegfilgrastim injection What is this medicine? PEGFILGRASTIM (PEG fil gra stim) is a long-acting granulocyte colony-stimulating factor that stimulates the growth of neutrophils, a type of white blood cell important in the body's fight against infection. It is used to reduce the incidence of fever and infection in patients with certain types of cancer who are receiving chemotherapy that affects the bone marrow, and to increase survival after being exposed to high doses of radiation. This medicine may be used for other purposes; ask your health care provider or pharmacist if you have questions. What should I tell my health care provider before I take this medicine? They need to know if you have any of these conditions: -kidney disease -latex allergy -ongoing radiation therapy -sickle cell disease -skin reactions to acrylic adhesives (On-Body Injector only) -an unusual or allergic reaction to pegfilgrastim, filgrastim, other medicines, foods, dyes, or preservatives -pregnant or trying to get pregnant -breast-feeding How should I use this medicine? This medicine is for injection under the skin. If you get this medicine at home, you will be taught how to prepare and give the pre-filled syringe or how to use the On-body Injector. Refer to the patient Instructions for Use for detailed instructions. Use exactly as directed. Take your medicine at regular intervals. Do not take your medicine more often than directed. It is important that you put your used needles and syringes in a special sharps container. Do not put them in a trash can. If you do not have a sharps container, call your pharmacist or healthcare provider to get one. Talk to your pediatrician regarding the use of this medicine in children. While this drug may be prescribed for selected conditions, precautions do apply. Overdosage: If you think you have taken too much of this medicine contact a poison control center or emergency room at  once. NOTE: This medicine is only for you. Do not share this medicine with others. What if I miss a dose? It is important not to miss your dose. Call your doctor or health care professional if you miss your dose. If you miss a dose due to an On-body Injector failure or leakage, a new dose should be administered as soon as possible using a single prefilled syringe for manual use. What may interact with this medicine? Interactions have not been studied. Give your health care provider a list of all the medicines, herbs, non-prescription drugs, or dietary supplements you use. Also tell them if you smoke, drink alcohol, or use illegal drugs. Some items may interact with your medicine. This list may not describe all possible interactions. Give your health care provider a list of all the medicines, herbs, non-prescription drugs, or dietary supplements you use. Also tell them if you smoke, drink alcohol, or use illegal drugs. Some items may interact with your medicine. What should I watch for while using this medicine? You may need blood work done while you are taking this medicine. If you are going to need a MRI, CT scan, or other procedure, tell your doctor that you are using this medicine (On-Body Injector only). What side effects may I notice from receiving this medicine? Side effects that you should report to your doctor or health care professional as soon as possible: -allergic reactions like skin rash, itching or hives, swelling of the face, lips, or tongue -dizziness -fever -pain, redness, or irritation at site where injected -pinpoint red spots on the skin -red or dark-brown urine -shortness of breath or breathing problems -stomach or side pain, or pain   at the shoulder -swelling -tiredness -trouble passing urine or change in the amount of urine Side effects that usually do not require medical attention (report to your doctor or health care professional if they continue or are  bothersome): -bone pain -muscle pain This list may not describe all possible side effects. Call your doctor for medical advice about side effects. You may report side effects to FDA at 1-800-FDA-1088. Where should I keep my medicine? Keep out of the reach of children. Store pre-filled syringes in a refrigerator between 2 and 8 degrees C (36 and 46 degrees F). Do not freeze. Keep in carton to protect from light. Throw away this medicine if it is left out of the refrigerator for more than 48 hours. Throw away any unused medicine after the expiration date. NOTE: This sheet is a summary. It may not cover all possible information. If you have questions about this medicine, talk to your doctor, pharmacist, or health care provider.    2016, Elsevier/Gold Standard. (2015-01-02 14:30:14) Implanted Port Home Guide An implanted port is a type of central line that is placed under the skin. Central lines are used to provide IV access when treatment or nutrition needs to be given through a person's veins. Implanted ports are used for long-term IV access. An implanted port may be placed because:   You need IV medicine that would be irritating to the small veins in your hands or arms.   You need long-term IV medicines, such as antibiotics.   You need IV nutrition for a long period.   You need frequent blood draws for lab tests.   You need dialysis.  Implanted ports are usually placed in the chest area, but they can also be placed in the upper arm, the abdomen, or the leg. An implanted port has two main parts:   Reservoir. The reservoir is round and will appear as a small, raised area under your skin. The reservoir is the part where a needle is inserted to give medicines or draw blood.   Catheter. The catheter is a thin, flexible tube that extends from the reservoir. The catheter is placed into a large vein. Medicine that is inserted into the reservoir goes into the catheter and then into the vein.   HOW WILL I CARE FOR MY INCISION SITE? Do not get the incision site wet. Bathe or shower as directed by your health care provider.  HOW IS MY PORT ACCESSED? Special steps must be taken to access the port:   Before the port is accessed, a numbing cream can be placed on the skin. This helps numb the skin over the port site.   Your health care provider uses a sterile technique to access the port.  Your health care provider must put on a mask and sterile gloves.  The skin over your port is cleaned carefully with an antiseptic and allowed to dry.  The port is gently pinched between sterile gloves, and a needle is inserted into the port.  Only "non-coring" port needles should be used to access the port. Once the port is accessed, a blood return should be checked. This helps ensure that the port is in the vein and is not clogged.   If your port needs to remain accessed for a constant infusion, a clear (transparent) bandage will be placed over the needle site. The bandage and needle will need to be changed every week, or as directed by your health care provider.   Keep the bandage covering the   needle clean and dry. Do not get it wet. Follow your health care provider's instructions on how to take a shower or bath while the port is accessed.   If your port does not need to stay accessed, no bandage is needed over the port.  WHAT IS FLUSHING? Flushing helps keep the port from getting clogged. Follow your health care provider's instructions on how and when to flush the port. Ports are usually flushed with saline solution or a medicine called heparin. The need for flushing will depend on how the port is used.   If the port is used for intermittent medicines or blood draws, the port will need to be flushed:   After medicines have been given.   After blood has been drawn.   As part of routine maintenance.   If a constant infusion is running, the port may not need to be flushed.  HOW  LONG WILL MY PORT STAY IMPLANTED? The port can stay in for as long as your health care provider thinks it is needed. When it is time for the port to come out, surgery will be done to remove it. The procedure is similar to the one performed when the port was put in.  WHEN SHOULD I SEEK IMMEDIATE MEDICAL CARE? When you have an implanted port, you should seek immediate medical care if:   You notice a bad smell coming from the incision site.   You have swelling, redness, or drainage at the incision site.   You have more swelling or pain at the port site or the surrounding area.   You have a fever that is not controlled with medicine.   This information is not intended to replace advice given to you by your health care provider. Make sure you discuss any questions you have with your health care provider.   Document Released: 12/13/2005 Document Revised: 10/03/2013 Document Reviewed: 08/20/2013 Elsevier Interactive Patient Education 2016 Elsevier Inc.  

## 2016-01-14 ENCOUNTER — Other Ambulatory Visit: Payer: Self-pay | Admitting: Hematology & Oncology

## 2016-01-14 ENCOUNTER — Other Ambulatory Visit (HOSPITAL_BASED_OUTPATIENT_CLINIC_OR_DEPARTMENT_OTHER): Payer: BLUE CROSS/BLUE SHIELD

## 2016-01-14 ENCOUNTER — Ambulatory Visit (HOSPITAL_BASED_OUTPATIENT_CLINIC_OR_DEPARTMENT_OTHER): Payer: BLUE CROSS/BLUE SHIELD

## 2016-01-14 VITALS — BP 96/50 | HR 55 | Temp 97.9°F | Resp 18

## 2016-01-14 DIAGNOSIS — C158 Malignant neoplasm of overlapping sites of esophagus: Secondary | ICD-10-CM

## 2016-01-14 DIAGNOSIS — Z5111 Encounter for antineoplastic chemotherapy: Secondary | ICD-10-CM | POA: Diagnosis not present

## 2016-01-14 DIAGNOSIS — C155 Malignant neoplasm of lower third of esophagus: Secondary | ICD-10-CM | POA: Diagnosis not present

## 2016-01-14 LAB — CMP (CANCER CENTER ONLY)
ALK PHOS: 113 U/L — AB (ref 26–84)
ALT: 12 U/L (ref 10–47)
AST: 21 U/L (ref 11–38)
Albumin: 3.1 g/dL — ABNORMAL LOW (ref 3.3–5.5)
BUN, Bld: 11 mg/dL (ref 7–22)
CALCIUM: 8.8 mg/dL (ref 8.0–10.3)
CHLORIDE: 103 meq/L (ref 98–108)
CO2: 26 mEq/L (ref 18–33)
Creat: 0.9 mg/dl (ref 0.6–1.2)
GLUCOSE: 105 mg/dL (ref 73–118)
POTASSIUM: 4 meq/L (ref 3.3–4.7)
Sodium: 141 mEq/L (ref 128–145)
Total Bilirubin: 0.6 mg/dl (ref 0.20–1.60)
Total Protein: 6.1 g/dL — ABNORMAL LOW (ref 6.4–8.1)

## 2016-01-14 LAB — CBC WITH DIFFERENTIAL (CANCER CENTER ONLY)
BASO#: 0 10*3/uL (ref 0.0–0.2)
BASO%: 0.2 % (ref 0.0–2.0)
EOS%: 1.7 % (ref 0.0–7.0)
Eosinophils Absolute: 0.2 10*3/uL (ref 0.0–0.5)
HEMATOCRIT: 35.7 % — AB (ref 38.7–49.9)
HGB: 11.5 g/dL — ABNORMAL LOW (ref 13.0–17.1)
LYMPH#: 2.7 10*3/uL (ref 0.9–3.3)
LYMPH%: 27.2 % (ref 14.0–48.0)
MCH: 30.4 pg (ref 28.0–33.4)
MCHC: 32.2 g/dL (ref 32.0–35.9)
MCV: 94 fL (ref 82–98)
MONO#: 0.9 10*3/uL (ref 0.1–0.9)
MONO%: 9.1 % (ref 0.0–13.0)
NEUT#: 6.2 10*3/uL (ref 1.5–6.5)
NEUT%: 61.8 % (ref 40.0–80.0)
Platelets: 176 10*3/uL (ref 145–400)
RBC: 3.78 10*6/uL — ABNORMAL LOW (ref 4.20–5.70)
RDW: 16.6 % — AB (ref 11.1–15.7)
WBC: 10 10*3/uL (ref 4.0–10.0)

## 2016-01-14 LAB — LACTATE DEHYDROGENASE: LDH: 157 U/L (ref 125–245)

## 2016-01-14 MED ORDER — SODIUM CHLORIDE 0.9 % IV SOLN
Freq: Once | INTRAVENOUS | Status: AC
  Administered 2016-01-14: 12:00:00 via INTRAVENOUS

## 2016-01-14 MED ORDER — FLUOROURACIL CHEMO INJECTION 2.5 GM/50ML
400.0000 mg/m2 | Freq: Once | INTRAVENOUS | Status: AC
  Administered 2016-01-14: 900 mg via INTRAVENOUS
  Filled 2016-01-14: qty 18

## 2016-01-14 MED ORDER — ATROPINE SULFATE 1 MG/ML IJ SOLN
0.5000 mg | Freq: Once | INTRAMUSCULAR | Status: AC | PRN
  Administered 2016-01-14: 0.5 mg via INTRAVENOUS

## 2016-01-14 MED ORDER — IRINOTECAN HCL CHEMO INJECTION 100 MG/5ML
133.0000 mg/m2 | Freq: Once | INTRAVENOUS | Status: AC
  Administered 2016-01-14: 300 mg via INTRAVENOUS
  Filled 2016-01-14: qty 15

## 2016-01-14 MED ORDER — LEUCOVORIN CALCIUM INJECTION 100 MG: 20.0000 mg/m2 | Freq: Once | INTRAMUSCULAR | Status: DC

## 2016-01-14 MED ORDER — SODIUM CHLORIDE 0.9 % IV SOLN
Freq: Once | INTRAVENOUS | Status: AC
  Administered 2016-01-14: 13:00:00 via INTRAVENOUS
  Filled 2016-01-14: qty 8

## 2016-01-14 MED ORDER — FLUOROURACIL CHEMO INJECTION 5 GM/100ML
1980.0000 mg/m2 | INTRAVENOUS | Status: DC
  Administered 2016-01-14: 4450 mg via INTRAVENOUS
  Filled 2016-01-14: qty 89

## 2016-01-14 MED ORDER — ATROPINE SULFATE 1 MG/ML IJ SOLN
INTRAMUSCULAR | Status: AC
  Filled 2016-01-14: qty 1

## 2016-01-14 MED ORDER — LEUCOVORIN CALCIUM INJECTION 350 MG
400.0000 mg/m2 | Freq: Once | INTRAVENOUS | Status: AC
  Administered 2016-01-14: 900 mg via INTRAVENOUS
  Filled 2016-01-14: qty 45

## 2016-01-14 NOTE — Patient Instructions (Signed)
[facility] Discharge Instructions for Patients Receiving Chemotherapy  Today you received the following chemotherapy agents Leucovorin, Camptosar and 5FU.  To help prevent nausea and vomiting after your treatment, we encourage you to take your nausea medication.   If you develop nausea and vomiting that is not controlled by your nausea medication, call the clinic.   BELOW ARE SYMPTOMS THAT SHOULD BE REPORTED IMMEDIATELY:  *FEVER GREATER THAN 100.5 F  *CHILLS WITH OR WITHOUT FEVER  NAUSEA AND VOMITING THAT IS NOT CONTROLLED WITH YOUR NAUSEA MEDICATION  *UNUSUAL SHORTNESS OF BREATH  *UNUSUAL BRUISING OR BLEEDING  TENDERNESS IN MOUTH AND THROAT WITH OR WITHOUT PRESENCE OF ULCERS  *URINARY PROBLEMS  *BOWEL PROBLEMS  UNUSUAL RASH Items with * indicate a potential emergency and should be followed up as soon as possible.  Feel free to call the clinic you have any questions or concerns. The clinic phone number is [phone].  Please show the CHEMO ALERT CARD at check-in to the Emergency Department and triage nurse.   

## 2016-01-16 ENCOUNTER — Ambulatory Visit (HOSPITAL_BASED_OUTPATIENT_CLINIC_OR_DEPARTMENT_OTHER): Payer: BLUE CROSS/BLUE SHIELD

## 2016-01-16 ENCOUNTER — Ambulatory Visit: Payer: BLUE CROSS/BLUE SHIELD

## 2016-01-16 VITALS — BP 105/78 | HR 77 | Temp 98.2°F | Resp 18

## 2016-01-16 DIAGNOSIS — Z5189 Encounter for other specified aftercare: Secondary | ICD-10-CM

## 2016-01-16 DIAGNOSIS — C155 Malignant neoplasm of lower third of esophagus: Secondary | ICD-10-CM | POA: Diagnosis not present

## 2016-01-16 MED ORDER — SODIUM CHLORIDE 0.9 % IJ SOLN
10.0000 mL | INTRAMUSCULAR | Status: DC | PRN
  Administered 2016-01-16: 10 mL
  Filled 2016-01-16: qty 10

## 2016-01-16 MED ORDER — HEPARIN SOD (PORK) LOCK FLUSH 100 UNIT/ML IV SOLN
500.0000 [IU] | Freq: Once | INTRAVENOUS | Status: AC | PRN
  Administered 2016-01-16: 500 [IU]
  Filled 2016-01-16: qty 5

## 2016-01-16 MED ORDER — PEGFILGRASTIM INJECTION 6 MG/0.6ML ~~LOC~~
6.0000 mg | PREFILLED_SYRINGE | Freq: Once | SUBCUTANEOUS | Status: AC
  Administered 2016-01-16: 6 mg via SUBCUTANEOUS

## 2016-01-16 NOTE — Patient Instructions (Signed)

## 2016-01-24 ENCOUNTER — Other Ambulatory Visit: Payer: Self-pay | Admitting: Hematology & Oncology

## 2016-01-26 ENCOUNTER — Encounter (HOSPITAL_BASED_OUTPATIENT_CLINIC_OR_DEPARTMENT_OTHER): Payer: BLUE CROSS/BLUE SHIELD | Admitting: Hematology & Oncology

## 2016-01-26 ENCOUNTER — Telehealth: Payer: Self-pay | Admitting: *Deleted

## 2016-01-26 ENCOUNTER — Ambulatory Visit (HOSPITAL_BASED_OUTPATIENT_CLINIC_OR_DEPARTMENT_OTHER): Payer: BLUE CROSS/BLUE SHIELD

## 2016-01-26 VITALS — BP 108/75 | HR 66 | Temp 98.2°F | Resp 20

## 2016-01-26 DIAGNOSIS — E860 Dehydration: Secondary | ICD-10-CM | POA: Diagnosis not present

## 2016-01-26 DIAGNOSIS — C155 Malignant neoplasm of lower third of esophagus: Secondary | ICD-10-CM

## 2016-01-26 LAB — CMP (CANCER CENTER ONLY)
ALT(SGPT): 13 U/L (ref 10–47)
AST: 17 U/L (ref 11–38)
Albumin: 3.1 g/dL — ABNORMAL LOW (ref 3.3–5.5)
Alkaline Phosphatase: 120 U/L — ABNORMAL HIGH (ref 26–84)
BILIRUBIN TOTAL: 0.6 mg/dL (ref 0.20–1.60)
BUN: 12 mg/dL (ref 7–22)
CHLORIDE: 100 meq/L (ref 98–108)
CO2: 27 meq/L (ref 18–33)
CREATININE: 0.8 mg/dL (ref 0.6–1.2)
Calcium: 9.1 mg/dL (ref 8.0–10.3)
GLUCOSE: 113 mg/dL (ref 73–118)
Potassium: 3.6 mEq/L (ref 3.3–4.7)
SODIUM: 138 meq/L (ref 128–145)
Total Protein: 6.1 g/dL — ABNORMAL LOW (ref 6.4–8.1)

## 2016-01-26 LAB — CBC WITH DIFFERENTIAL (CANCER CENTER ONLY)
BASO#: 0 10*3/uL (ref 0.0–0.2)
BASO%: 0.3 % (ref 0.0–2.0)
EOS ABS: 0.1 10*3/uL (ref 0.0–0.5)
EOS%: 0.5 % (ref 0.0–7.0)
HCT: 33.2 % — ABNORMAL LOW (ref 38.7–49.9)
HEMOGLOBIN: 10.9 g/dL — AB (ref 13.0–17.1)
LYMPH#: 2.4 10*3/uL (ref 0.9–3.3)
LYMPH%: 26.6 % (ref 14.0–48.0)
MCH: 30 pg (ref 28.0–33.4)
MCHC: 32.8 g/dL (ref 32.0–35.9)
MCV: 92 fL (ref 82–98)
MONO#: 1.3 10*3/uL — ABNORMAL HIGH (ref 0.1–0.9)
MONO%: 14.4 % — AB (ref 0.0–13.0)
NEUT%: 58.2 % (ref 40.0–80.0)
NEUTROS ABS: 5.4 10*3/uL (ref 1.5–6.5)
PLATELETS: 255 10*3/uL (ref 145–400)
RBC: 3.63 10*6/uL — ABNORMAL LOW (ref 4.20–5.70)
RDW: 17 % — AB (ref 11.1–15.7)
WBC: 9.2 10*3/uL (ref 4.0–10.0)

## 2016-01-26 MED ORDER — SODIUM CHLORIDE 0.9 % IV SOLN
1000.0000 mL | Freq: Once | INTRAVENOUS | Status: AC
  Administered 2016-01-26: 1000 mL via INTRAVENOUS

## 2016-01-26 MED ORDER — SODIUM CHLORIDE 0.9% FLUSH
10.0000 mL | INTRAVENOUS | Status: DC | PRN
  Administered 2016-01-26: 10 mL via INTRAVENOUS
  Filled 2016-01-26: qty 10

## 2016-01-26 MED ORDER — HEPARIN SOD (PORK) LOCK FLUSH 100 UNIT/ML IV SOLN
500.0000 [IU] | Freq: Once | INTRAVENOUS | Status: AC
  Administered 2016-01-26: 500 [IU] via INTRAVENOUS
  Filled 2016-01-26: qty 5

## 2016-01-26 NOTE — Telephone Encounter (Signed)
Patient's wife states that patient feels like he could use IV fluids today to feel better. He has regularly scheduled chemo on Wednesday. Spoke with Dr Marin Olp and patient will come in this afternoon for IVF and labs. Patient's wife aware.

## 2016-01-26 NOTE — Patient Instructions (Signed)

## 2016-01-28 ENCOUNTER — Encounter: Payer: Self-pay | Admitting: Hematology & Oncology

## 2016-01-28 ENCOUNTER — Ambulatory Visit (HOSPITAL_BASED_OUTPATIENT_CLINIC_OR_DEPARTMENT_OTHER): Payer: BLUE CROSS/BLUE SHIELD | Admitting: Hematology & Oncology

## 2016-01-28 ENCOUNTER — Ambulatory Visit (HOSPITAL_BASED_OUTPATIENT_CLINIC_OR_DEPARTMENT_OTHER): Payer: BLUE CROSS/BLUE SHIELD

## 2016-01-28 ENCOUNTER — Other Ambulatory Visit: Payer: BLUE CROSS/BLUE SHIELD

## 2016-01-28 VITALS — BP 112/57 | HR 64 | Temp 97.6°F | Resp 16 | Ht 78.0 in | Wt 178.0 lb

## 2016-01-28 DIAGNOSIS — C155 Malignant neoplasm of lower third of esophagus: Secondary | ICD-10-CM | POA: Diagnosis not present

## 2016-01-28 DIAGNOSIS — Z5111 Encounter for antineoplastic chemotherapy: Secondary | ICD-10-CM

## 2016-01-28 DIAGNOSIS — F0390 Unspecified dementia without behavioral disturbance: Secondary | ICD-10-CM

## 2016-01-28 DIAGNOSIS — R413 Other amnesia: Secondary | ICD-10-CM | POA: Diagnosis not present

## 2016-01-28 MED ORDER — SODIUM CHLORIDE 0.9 % IV SOLN
Freq: Once | INTRAVENOUS | Status: AC
  Administered 2016-01-28: 09:00:00 via INTRAVENOUS

## 2016-01-28 MED ORDER — LEUCOVORIN CALCIUM INJECTION 100 MG
20.0000 mg/m2 | Freq: Once | INTRAMUSCULAR | Status: AC
  Administered 2016-01-28: 46 mg via INTRAVENOUS
  Filled 2016-01-28: qty 2.3

## 2016-01-28 MED ORDER — MEMANTINE HCL 10 MG PO TABS
10.0000 mg | ORAL_TABLET | Freq: Two times a day (BID) | ORAL | Status: AC
Start: 2016-01-28 — End: ?

## 2016-01-28 MED ORDER — SODIUM CHLORIDE 0.9 % IV SOLN
1980.0000 mg/m2 | INTRAVENOUS | Status: DC
  Administered 2016-01-28: 4450 mg via INTRAVENOUS
  Filled 2016-01-28: qty 89

## 2016-01-28 MED ORDER — FLUOROURACIL CHEMO INJECTION 2.5 GM/50ML
400.0000 mg/m2 | Freq: Once | INTRAVENOUS | Status: AC
  Administered 2016-01-28: 900 mg via INTRAVENOUS
  Filled 2016-01-28: qty 18

## 2016-01-28 MED ORDER — ATROPINE SULFATE 1 MG/ML IJ SOLN
0.5000 mg | Freq: Once | INTRAMUSCULAR | Status: AC | PRN
  Administered 2016-01-28: 0.5 mg via INTRAVENOUS

## 2016-01-28 MED ORDER — IRINOTECAN HCL CHEMO INJECTION 100 MG/5ML
119.7000 mg/m2 | Freq: Once | INTRAVENOUS | Status: AC
  Administered 2016-01-28: 270 mg via INTRAVENOUS
  Filled 2016-01-28: qty 13.5

## 2016-01-28 MED ORDER — ATROPINE SULFATE 1 MG/ML IJ SOLN
INTRAMUSCULAR | Status: AC
  Filled 2016-01-28: qty 1

## 2016-01-28 MED ORDER — SODIUM CHLORIDE 0.9 % IV SOLN
Freq: Once | INTRAVENOUS | Status: AC
  Administered 2016-01-28: 10:00:00 via INTRAVENOUS
  Filled 2016-01-28: qty 8

## 2016-01-28 NOTE — Progress Notes (Signed)
Hematology and Oncology Follow Up Visit  Willie Owens [id number] October 12, 1951 65 y.o. 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 He is lost a little bit awake. She says that he is eating a little better.  The main problem that he has is his memory. I'm still not sure as to why his memory has been so bad. He has had periods in which he has been totally demented. Then all of a sudden, he gets better.  We'll try months some Namenda or Aricept and see if this would help with his memory.  He's not hurting.  She's had no mouth sores. He's had no diarrhea. He's had no rashes. He's had no leg swelling.  At the end of February, he does wife will be going out of [place] to see their daughter. Hopefully, they will be going on a cruise   Overall, his performance status is ECOG 1.   Medications:    Medication List       This list is accurate as of: 01/28/16  9:10 AM.  Always use your most recent med list.               ALIGN 4 MG Caps  Take 1 capsule by mouth daily with breakfast.     aspirin 81 MG chewable tablet  Chew 1 tablet (81 mg total) by mouth daily.     benzonatate 100 MG capsule  Commonly known as:  TESSALON PERLES  Take 1 capsule (100 mg total) by mouth 3 (three) times daily as needed for cough.     bismuth subsalicylate 465 KC/12XN suspension  Commonly known as:  PEPTO BISMOL  Take 30 mLs by mouth every 6 (six) hours as needed for diarrhea or loose stools.     cetirizine 10 MG tablet  Commonly known as:  ZYRTEC  Take 10 mg by mouth daily as needed for allergies.     diphenhydrAMINE 25 MG tablet  Commonly known as:  BENADRYL  Take 25 mg by mouth every 6 (six) hours as needed for allergies.     diphenoxylate-atropine 2.5-0.025 MG tablet  Commonly known as:  LOMOTIL  TAKE 1  TABLET BY MOUTH FOUR TIMES DAILY AS NEEDED FOR DIARRHEA     dronabinol 5 MG capsule  Commonly known as:  MARINOL  Take 1 capsule (5 mg total) by mouth 2 (two) times daily before a meal.     lidocaine-prilocaine cream  Commonly known as:  EMLA  APPLY TO THE AFFECTED AREA AS NEEDED     lipase/protease/amylase 36000 UNITS Cpep capsule  Commonly known as:  CREON  Take 1 capsule (36,000 Units total) by mouth 3 (three) times daily before meals.     loperamide 2 MG tablet  Commonly known as:  IMODIUM A-D  Take 2 at onset of diarrhea, then 1 every 2hrs until 12hr without a BM. May take 2 tab every 4hrs at bedtime. If diarrhea recurs repeat.     MAGNESIUM-OXIDE 400 (241.3 Mg) MG tablet  Generic drug:  magnesium oxide  TK 1 T PO BID     multivitamin with minerals Tabs tablet  Take 1 tablet by mouth daily.     omeprazole 40 MG capsule  Commonly known as:  PRILOSEC  TAKE 1 CAPSULE BY MOUTH EVERY DAY  ondansetron 4 MG disintegrating tablet  Commonly known as:  ZOFRAN ODT  Take 1 tablet (4 mg total) by mouth every 8 (eight) hours as needed for nausea or vomiting.     ondansetron 8 MG tablet  Commonly known as:  ZOFRAN  Take 1 tablet (8 mg total) by mouth 2 (two) times daily. Start the day after chemo for 3 days. Then take as needed for nausea or vomiting.     pegfilgrastim 6 MG/0.6ML injection  Commonly known as:  NEULASTA  Inject 6 mg into the skin once.     potassium chloride SA 20 MEQ tablet  Commonly known as:  K-DUR,KLOR-CON  TAKE 2 TABLETS(40 MEQ) BY MOUTH DAILY     PRESCRIPTION MEDICATION  Chemo - [facility]     prochlorperazine 10 MG tablet  Commonly known as:  COMPAZINE  Take 10 mg by mouth every 6 (six) hours as needed for nausea or vomiting.     propranolol ER 60 MG 24 hr capsule  Commonly known as:  INDERAL LA  TAKE 1 CAPSULE BY MOUTH AT BEDTIME     pyridOXINE 100 MG tablet  Commonly known as:  VITAMIN B-6  Take 2 pills at one time once a day.     SUPER B  COMPLEX PO  Take 1 tablet by mouth daily.     traZODone 50 MG tablet  Commonly known as:  DESYREL  Take 1 tablet (50 mg total) by mouth at bedtime.     venlafaxine 37.5 MG tablet  Commonly known as:  EFFEXOR     venlafaxine 37.5 MG tablet  Commonly known as:  EFFEXOR  TAKE 1 TABLET BY MOUTH TWICE DAILY WITH A MEAL     Vitamin D 2000 units tablet  Take 2,000 Units by mouth daily.        Allergies:  Allergies  Allergen Reactions  . Penicillins Other (See Comments)    Hallucinations, from childhood    Past Medical History, Surgical history, Social history, and Family History were reviewed and updated.  Review of Systems: All other 10 point review of systems is negative.   Physical Exam:  height is 6' 6"  (1.981 m) and weight is 178 lb (80.74 kg). His oral temperature is 97.6 F (36.4 C). His blood pressure is 112/57 and his pulse is 64. His respiration is 16.   Wt Readings from Last 3 Encounters:  01/28/16 178 lb (80.74 kg)  12/30/15 181 lb (82.101 kg)  11/25/15 176 lb (79.833 kg)    Ocular: Sclerae unicteric, pupils equal, round and reactive to light Ear-nose-throat: Oropharynx clear, dentition fair Lymphatic: No cervical or supraclavicular adenopathy Lungs no rales or rhonchi, good excursion bilaterally Heart regular rate and rhythm, no murmur appreciated Abd soft, nontender, positive bowel sounds MSK no focal spinal tenderness, no joint edema Neuro: non-focal, well-oriented, appropriate affect Breasts: Deferred  Lab Results  Component Value Date   WBC 9.2 01/26/2016   HGB 10.9* 01/26/2016   HCT 33.2* 01/26/2016   MCV 92 01/26/2016   PLT 255 01/26/2016   Lab Results  Component Value Date   FERRITIN 35 10/03/2014   IRON 40* 10/03/2014   TIBC 279 10/03/2014   UIBC 239 10/03/2014   IRONPCTSAT 14* 10/03/2014   Lab Results  Component Value Date   RBC 3.63* 01/26/2016   No results found for: KPAFRELGTCHN, LAMBDASER, KAPLAMBRATIO No results found for:  IGGSERUM, IGA, IGMSERUM No results found for: TOTALPROTELP, ALBUMINELP, A1GS, A2GS, BETS, BETA2SER, [place], [place], SPEI   Chemistry  Component Value Date/Time   NA 138 01/26/2016 1320   NA 133* 08/18/2015 1550   K 3.6 01/26/2016 1320   K 4.6 08/18/2015 1550   CL 100 01/26/2016 1320   CL 101 08/18/2015 1550   CO2 27 01/26/2016 1320   CO2 24 08/18/2015 1550   BUN 12 01/26/2016 1320   BUN 14 08/18/2015 1550   CREATININE 0.8 01/26/2016 1320   CREATININE 1.32* 08/18/2015 1550      Component Value Date/Time   CALCIUM 9.1 01/26/2016 1320   CALCIUM 8.3* 08/18/2015 1550   ALKPHOS 120* 01/26/2016 1320   ALKPHOS 41 08/18/2015 1550   AST 17 01/26/2016 1320   AST 22 08/18/2015 1550   ALT 13 01/26/2016 1320   ALT 14* 08/18/2015 1550   BILITOT 0.60 01/26/2016 1320   BILITOT 0.5 08/18/2015 1550     Impression and Plan: Mr. Pat is 64 yo white male with metastatic adenocarcinoma of the distal esophagus.  We probably will repeat a PET scan at the end of February.  We will go ahead and give him treatment today. His blood counts were okay.  I still want to make sure we focus on his quality of life. This is really what he and his wife want to make a priority.  We'll have him come back in 2 weeks for treatment. At July, we will give him about a month break and do the PET scan during that time.  [name], MD 2/1/20179:10 AM

## 2016-01-28 NOTE — Patient Instructions (Signed)
Cancer Center Discharge Instructions for Patients Receiving Chemotherapy  Today you received the following chemotherapy agents Leucovorin, Camptosar and 5FU.  To help prevent nausea and vomiting after your treatment, we encourage you to take your nausea medication.   If you develop nausea and vomiting that is not controlled by your nausea medication, call the clinic.   BELOW ARE SYMPTOMS THAT SHOULD BE REPORTED IMMEDIATELY:  *FEVER GREATER THAN 100.5 F  *CHILLS WITH OR WITHOUT FEVER  NAUSEA AND VOMITING THAT IS NOT CONTROLLED WITH YOUR NAUSEA MEDICATION  *UNUSUAL SHORTNESS OF BREATH  *UNUSUAL BRUISING OR BLEEDING  TENDERNESS IN MOUTH AND THROAT WITH OR WITHOUT PRESENCE OF ULCERS  *URINARY PROBLEMS  *BOWEL PROBLEMS  UNUSUAL RASH Items with * indicate a potential emergency and should be followed up as soon as possible.  Feel free to call the clinic you have any questions or concerns. The clinic phone number is [phone].  Please show the CHEMO ALERT CARD at check-in to the Emergency Department and triage nurse.   

## 2016-01-29 NOTE — Progress Notes (Signed)
This encounter was created in error - please disregard.

## 2016-01-30 ENCOUNTER — Ambulatory Visit (HOSPITAL_BASED_OUTPATIENT_CLINIC_OR_DEPARTMENT_OTHER): Payer: BLUE CROSS/BLUE SHIELD

## 2016-01-30 ENCOUNTER — Ambulatory Visit: Payer: BLUE CROSS/BLUE SHIELD

## 2016-01-30 VITALS — BP 109/70 | HR 65 | Temp 97.8°F | Resp 18

## 2016-01-30 DIAGNOSIS — Z5189 Encounter for other specified aftercare: Secondary | ICD-10-CM | POA: Diagnosis not present

## 2016-01-30 DIAGNOSIS — C155 Malignant neoplasm of lower third of esophagus: Secondary | ICD-10-CM

## 2016-01-30 MED ORDER — PEGFILGRASTIM INJECTION 6 MG/0.6ML ~~LOC~~
PREFILLED_SYRINGE | SUBCUTANEOUS | Status: AC
  Filled 2016-01-30: qty 0.6

## 2016-01-30 MED ORDER — HEPARIN SOD (PORK) LOCK FLUSH 100 UNIT/ML IV SOLN
500.0000 [IU] | Freq: Once | INTRAVENOUS | Status: AC | PRN
  Administered 2016-01-30: 500 [IU]
  Filled 2016-01-30: qty 5

## 2016-01-30 MED ORDER — SODIUM CHLORIDE 0.9 % IJ SOLN
10.0000 mL | INTRAMUSCULAR | Status: DC | PRN
  Administered 2016-01-30: 10 mL
  Filled 2016-01-30: qty 10

## 2016-01-30 MED ORDER — PEGFILGRASTIM INJECTION 6 MG/0.6ML ~~LOC~~
6.0000 mg | PREFILLED_SYRINGE | Freq: Once | SUBCUTANEOUS | Status: AC
  Administered 2016-01-30: 6 mg via SUBCUTANEOUS

## 2016-01-30 NOTE — Patient Instructions (Signed)
Pegfilgrastim injection What is this medicine? PEGFILGRASTIM (PEG fil gra stim) is a long-acting granulocyte colony-stimulating factor that stimulates the growth of neutrophils, a type of white blood cell important in the body's fight against infection. It is used to reduce the incidence of fever and infection in patients with certain types of cancer who are receiving chemotherapy that affects the bone marrow, and to increase survival after being exposed to high doses of radiation. This medicine may be used for other purposes; ask your health care provider or pharmacist if you have questions. What should I tell my health care provider before I take this medicine? They need to know if you have any of these conditions: -kidney disease -latex allergy -ongoing radiation therapy -sickle cell disease -skin reactions to acrylic adhesives (On-Body Injector only) -an unusual or allergic reaction to pegfilgrastim, filgrastim, other medicines, foods, dyes, or preservatives -pregnant or trying to get pregnant -breast-feeding How should I use this medicine? This medicine is for injection under the skin. If you get this medicine at home, you will be taught how to prepare and give the pre-filled syringe or how to use the On-body Injector. Refer to the patient Instructions for Use for detailed instructions. Use exactly as directed. Take your medicine at regular intervals. Do not take your medicine more often than directed. It is important that you put your used needles and syringes in a special sharps container. Do not put them in a trash can. If you do not have a sharps container, call your pharmacist or healthcare provider to get one. Talk to your pediatrician regarding the use of this medicine in children. While this drug may be prescribed for selected conditions, precautions do apply. Overdosage: If you think you have taken too much of this medicine contact a poison control center or emergency room at  once. NOTE: This medicine is only for you. Do not share this medicine with others. What if I miss a dose? It is important not to miss your dose. Call your doctor or health care professional if you miss your dose. If you miss a dose due to an On-body Injector failure or leakage, a new dose should be administered as soon as possible using a single prefilled syringe for manual use. What may interact with this medicine? Interactions have not been studied. Give your health care provider a list of all the medicines, herbs, non-prescription drugs, or dietary supplements you use. Also tell them if you smoke, drink alcohol, or use illegal drugs. Some items may interact with your medicine. This list may not describe all possible interactions. Give your health care provider a list of all the medicines, herbs, non-prescription drugs, or dietary supplements you use. Also tell them if you smoke, drink alcohol, or use illegal drugs. Some items may interact with your medicine. What should I watch for while using this medicine? You may need blood work done while you are taking this medicine. If you are going to need a MRI, CT scan, or other procedure, tell your doctor that you are using this medicine (On-Body Injector only). What side effects may I notice from receiving this medicine? Side effects that you should report to your doctor or health care professional as soon as possible: -allergic reactions like skin rash, itching or hives, swelling of the face, lips, or tongue -dizziness -fever -pain, redness, or irritation at site where injected -pinpoint red spots on the skin -red or dark-brown urine -shortness of breath or breathing problems -stomach or side pain, or pain   at the shoulder -swelling -tiredness -trouble passing urine or change in the amount of urine Side effects that usually do not require medical attention (report to your doctor or health care professional if they continue or are  bothersome): -bone pain -muscle pain This list may not describe all possible side effects. Call your doctor for medical advice about side effects. You may report side effects to FDA at 1-800-FDA-1088. Where should I keep my medicine? Keep out of the reach of children. Store pre-filled syringes in a refrigerator between 2 and 8 degrees C (36 and 46 degrees F). Do not freeze. Keep in carton to protect from light. Throw away this medicine if it is left out of the refrigerator for more than 48 hours. Throw away any unused medicine after the expiration date. NOTE: This sheet is a summary. It may not cover all possible information. If you have questions about this medicine, talk to your doctor, pharmacist, or health care provider.    2016, Elsevier/Gold Standard. (2015-01-02 14:30:14) Implanted Port Home Guide An implanted port is a type of central line that is placed under the skin. Central lines are used to provide IV access when treatment or nutrition needs to be given through a person's veins. Implanted ports are used for long-term IV access. An implanted port may be placed because:   You need IV medicine that would be irritating to the small veins in your hands or arms.   You need long-term IV medicines, such as antibiotics.   You need IV nutrition for a long period.   You need frequent blood draws for lab tests.   You need dialysis.  Implanted ports are usually placed in the chest area, but they can also be placed in the upper arm, the abdomen, or the leg. An implanted port has two main parts:   Reservoir. The reservoir is round and will appear as a small, raised area under your skin. The reservoir is the part where a needle is inserted to give medicines or draw blood.   Catheter. The catheter is a thin, flexible tube that extends from the reservoir. The catheter is placed into a large vein. Medicine that is inserted into the reservoir goes into the catheter and then into the vein.   HOW WILL I CARE FOR MY INCISION SITE? Do not get the incision site wet. Bathe or shower as directed by your health care provider.  HOW IS MY PORT ACCESSED? Special steps must be taken to access the port:   Before the port is accessed, a numbing cream can be placed on the skin. This helps numb the skin over the port site.   Your health care provider uses a sterile technique to access the port.  Your health care provider must put on a mask and sterile gloves.  The skin over your port is cleaned carefully with an antiseptic and allowed to dry.  The port is gently pinched between sterile gloves, and a needle is inserted into the port.  Only "non-coring" port needles should be used to access the port. Once the port is accessed, a blood return should be checked. This helps ensure that the port is in the vein and is not clogged.   If your port needs to remain accessed for a constant infusion, a clear (transparent) bandage will be placed over the needle site. The bandage and needle will need to be changed every week, or as directed by your health care provider.   Keep the bandage covering the   needle clean and dry. Do not get it wet. Follow your health care provider's instructions on how to take a shower or bath while the port is accessed.   If your port does not need to stay accessed, no bandage is needed over the port.  WHAT IS FLUSHING? Flushing helps keep the port from getting clogged. Follow your health care provider's instructions on how and when to flush the port. Ports are usually flushed with saline solution or a medicine called heparin. The need for flushing will depend on how the port is used.   If the port is used for intermittent medicines or blood draws, the port will need to be flushed:   After medicines have been given.   After blood has been drawn.   As part of routine maintenance.   If a constant infusion is running, the port may not need to be flushed.  HOW  LONG WILL MY PORT STAY IMPLANTED? The port can stay in for as long as your health care provider thinks it is needed. When it is time for the port to come out, surgery will be done to remove it. The procedure is similar to the one performed when the port was put in.  WHEN SHOULD I SEEK IMMEDIATE MEDICAL CARE? When you have an implanted port, you should seek immediate medical care if:   You notice a bad smell coming from the incision site.   You have swelling, redness, or drainage at the incision site.   You have more swelling or pain at the port site or the surrounding area.   You have a fever that is not controlled with medicine.   This information is not intended to replace advice given to you by your health care provider. Make sure you discuss any questions you have with your health care provider.   Document Released: 12/13/2005 Document Revised: 10/03/2013 Document Reviewed: 08/20/2013 Elsevier Interactive Patient Education 2016 Elsevier Inc.  

## 2016-02-03 ENCOUNTER — Ambulatory Visit (HOSPITAL_BASED_OUTPATIENT_CLINIC_OR_DEPARTMENT_OTHER): Payer: BLUE CROSS/BLUE SHIELD

## 2016-02-03 VITALS — BP 134/71 | HR 84 | Resp 18

## 2016-02-03 DIAGNOSIS — C155 Malignant neoplasm of lower third of esophagus: Secondary | ICD-10-CM | POA: Diagnosis not present

## 2016-02-03 DIAGNOSIS — E860 Dehydration: Secondary | ICD-10-CM | POA: Diagnosis not present

## 2016-02-03 MED ORDER — SODIUM CHLORIDE 0.9 % IV SOLN
INTRAVENOUS | Status: DC
  Administered 2016-02-03: 11:00:00 via INTRAVENOUS

## 2016-02-03 MED ORDER — SODIUM CHLORIDE 0.9% FLUSH
10.0000 mL | INTRAVENOUS | Status: DC | PRN
Start: 2016-02-03 — End: 2016-02-03
  Administered 2016-02-03: 10 mL via INTRAVENOUS
  Filled 2016-02-03: qty 10

## 2016-02-03 MED ORDER — HEPARIN SOD (PORK) LOCK FLUSH 100 UNIT/ML IV SOLN
500.0000 [IU] | Freq: Once | INTRAVENOUS | Status: AC
  Administered 2016-02-03: 500 [IU] via INTRAVENOUS
  Filled 2016-02-03: qty 5

## 2016-02-03 NOTE — Patient Instructions (Signed)

## 2016-02-09 ENCOUNTER — Encounter: Payer: Self-pay | Admitting: *Deleted

## 2016-02-09 NOTE — Progress Notes (Signed)
Received a phone call from [facility] that patient is now enrolled in their Nurse Case Management Program. His nurse is Solicitor [phone]

## 2016-02-11 ENCOUNTER — Encounter: Payer: Self-pay | Admitting: Hematology & Oncology

## 2016-02-11 ENCOUNTER — Ambulatory Visit (HOSPITAL_BASED_OUTPATIENT_CLINIC_OR_DEPARTMENT_OTHER): Payer: BLUE CROSS/BLUE SHIELD | Admitting: Hematology & Oncology

## 2016-02-11 ENCOUNTER — Ambulatory Visit (HOSPITAL_BASED_OUTPATIENT_CLINIC_OR_DEPARTMENT_OTHER): Payer: BLUE CROSS/BLUE SHIELD

## 2016-02-11 ENCOUNTER — Other Ambulatory Visit (HOSPITAL_BASED_OUTPATIENT_CLINIC_OR_DEPARTMENT_OTHER): Payer: BLUE CROSS/BLUE SHIELD

## 2016-02-11 VITALS — BP 113/54 | HR 67 | Temp 97.6°F | Resp 16 | Ht 78.0 in | Wt 173.0 lb

## 2016-02-11 DIAGNOSIS — Z5111 Encounter for antineoplastic chemotherapy: Secondary | ICD-10-CM | POA: Diagnosis not present

## 2016-02-11 DIAGNOSIS — C155 Malignant neoplasm of lower third of esophagus: Secondary | ICD-10-CM

## 2016-02-11 DIAGNOSIS — R42 Dizziness and giddiness: Secondary | ICD-10-CM | POA: Diagnosis not present

## 2016-02-11 LAB — CBC WITH DIFFERENTIAL (CANCER CENTER ONLY)
BASO#: 0 10*3/uL (ref 0.0–0.2)
BASO%: 0.3 % (ref 0.0–2.0)
EOS ABS: 0.1 10*3/uL (ref 0.0–0.5)
EOS%: 1.1 % (ref 0.0–7.0)
HCT: 37.2 % — ABNORMAL LOW (ref 38.7–49.9)
HGB: 12.1 g/dL — ABNORMAL LOW (ref 13.0–17.1)
LYMPH#: 2.6 10*3/uL (ref 0.9–3.3)
LYMPH%: 19.8 % (ref 14.0–48.0)
MCH: 29.7 pg (ref 28.0–33.4)
MCHC: 32.5 g/dL (ref 32.0–35.9)
MCV: 91 fL (ref 82–98)
MONO#: 0.9 10*3/uL (ref 0.1–0.9)
MONO%: 6.6 % (ref 0.0–13.0)
NEUT#: 9.5 10*3/uL — ABNORMAL HIGH (ref 1.5–6.5)
NEUT%: 72.2 % (ref 40.0–80.0)
PLATELETS: 239 10*3/uL (ref 145–400)
RBC: 4.08 10*6/uL — AB (ref 4.20–5.70)
RDW: 18.7 % — ABNORMAL HIGH (ref 11.1–15.7)
WBC: 13.2 10*3/uL — AB (ref 4.0–10.0)

## 2016-02-11 LAB — CMP (CANCER CENTER ONLY)
ALT(SGPT): 14 U/L (ref 10–47)
AST: 24 U/L (ref 11–38)
Albumin: 3.7 g/dL (ref 3.3–5.5)
Alkaline Phosphatase: 130 U/L — ABNORMAL HIGH (ref 26–84)
BUN: 13 mg/dL (ref 7–22)
CALCIUM: 9.5 mg/dL (ref 8.0–10.3)
CHLORIDE: 101 meq/L (ref 98–108)
CO2: 27 mEq/L (ref 18–33)
Creat: 1.2 mg/dl (ref 0.6–1.2)
Glucose, Bld: 118 mg/dL (ref 73–118)
POTASSIUM: 3.9 meq/L (ref 3.3–4.7)
Sodium: 137 mEq/L (ref 128–145)
TOTAL PROTEIN: 6.3 g/dL — AB (ref 6.4–8.1)
Total Bilirubin: 0.8 mg/dl (ref 0.20–1.60)

## 2016-02-11 MED ORDER — LEUCOVORIN CALCIUM INJECTION 350 MG
400.0000 mg/m2 | Freq: Once | INTRAVENOUS | Status: AC
  Administered 2016-02-11: 900 mg via INTRAVENOUS
  Filled 2016-02-11: qty 45

## 2016-02-11 MED ORDER — FLUOROURACIL CHEMO INJECTION 2.5 GM/50ML
400.0000 mg/m2 | Freq: Once | INTRAVENOUS | Status: AC
  Administered 2016-02-11: 900 mg via INTRAVENOUS
  Filled 2016-02-11: qty 18

## 2016-02-11 MED ORDER — ATROPINE SULFATE 1 MG/ML IJ SOLN
0.5000 mg | Freq: Once | INTRAMUSCULAR | Status: AC | PRN
  Administered 2016-02-11: 0.5 mg via INTRAVENOUS

## 2016-02-11 MED ORDER — SODIUM CHLORIDE 0.9 % IV SOLN
1980.0000 mg/m2 | INTRAVENOUS | Status: DC
  Administered 2016-02-11: 4450 mg via INTRAVENOUS
  Filled 2016-02-11: qty 89

## 2016-02-11 MED ORDER — ATROPINE SULFATE 1 MG/ML IJ SOLN
INTRAMUSCULAR | Status: AC
  Filled 2016-02-11: qty 1

## 2016-02-11 MED ORDER — IRINOTECAN HCL CHEMO INJECTION 100 MG/5ML
119.7000 mg/m2 | Freq: Once | INTRAVENOUS | Status: AC
  Administered 2016-02-11: 270 mg via INTRAVENOUS
  Filled 2016-02-11: qty 13.5

## 2016-02-11 MED ORDER — SODIUM CHLORIDE 0.9 % IV SOLN
Freq: Once | INTRAVENOUS | Status: AC
  Administered 2016-02-11: 09:00:00 via INTRAVENOUS

## 2016-02-11 MED ORDER — SODIUM CHLORIDE 0.9 % IV SOLN
Freq: Once | INTRAVENOUS | Status: AC
  Administered 2016-02-11: 10:00:00 via INTRAVENOUS
  Filled 2016-02-11: qty 8

## 2016-02-11 NOTE — Progress Notes (Signed)
Hematology and Oncology Follow Up Visit  Willie Owens [id number] 1951-09-08 64 y.o. 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 Stegall is here today with his wife for follow-up. He is doing okay. He still has the memory problems. Thankfully, is not as bad as it was a couple years ago.  He's gained a little bit of weight. His appetite is about the same. He's had no nausea or vomiting.  He is taking some marijuana. This does seem to help him according to his wife.  He's not had any issues with diarrhea.  He has had some tingling in the fingers and toes. This is about the same.  He has a little bit dizziness when he stands up. We will see about getting him off the propranolol. This might be causing problems for him.   Overall, his performance status is ECOG 1.   Medications:    Medication List       This list is accurate as of: 02/11/16  9:05 AM.  Always use your most recent med list.               ALIGN 4 MG Caps  Take 1 capsule by mouth daily with breakfast.     aspirin 81 MG chewable tablet  Chew 1 tablet (81 mg total) by mouth daily.     benzonatate 100 MG capsule  Commonly known as:  TESSALON PERLES  Take 1 capsule (100 mg total) by mouth 3 (three) times daily as needed for cough.     bismuth subsalicylate 915 AV/69VX suspension  Commonly known as:  PEPTO BISMOL  Take 30 mLs by mouth every 6 (six) hours as needed for diarrhea or loose stools.     cetirizine 10 MG tablet  Commonly known as:  ZYRTEC  Take 10 mg by mouth daily as needed for allergies.     diphenhydrAMINE 25 MG tablet  Commonly known as:  BENADRYL  Take 25 mg by mouth every 6 (six) hours as needed for allergies.     diphenoxylate-atropine 2.5-0.025 MG tablet  Commonly known as:  LOMOTIL  TAKE 1 TABLET BY MOUTH FOUR TIMES DAILY AS NEEDED FOR DIARRHEA       dronabinol 5 MG capsule  Commonly known as:  MARINOL  Take 1 capsule (5 mg total) by mouth 2 (two) times daily before a meal.     lidocaine-prilocaine cream  Commonly known as:  EMLA  APPLY TO THE AFFECTED AREA AS NEEDED     lipase/protease/amylase 36000 UNITS Cpep capsule  Commonly known as:  CREON  Take 1 capsule (36,000 Units total) by mouth 3 (three) times daily before meals.     loperamide 2 MG tablet  Commonly known as:  IMODIUM A-D  Take 2 at onset of diarrhea, then 1 every 2hrs until 12hr without a BM. May take 2 tab every 4hrs at bedtime. If diarrhea recurs repeat.     MAGNESIUM-OXIDE 400 (241.3 Mg) MG tablet  Generic drug:  magnesium oxide  TK 1 T PO BID     memantine 10 MG tablet  Commonly known as:  NAMENDA  Take 1 tablet (10 mg total) by mouth 2 (two) times daily.     multivitamin with minerals Tabs tablet  Take 1 tablet by mouth daily.     omeprazole 40 MG capsule  Commonly known as:  PRILOSEC  TAKE 1 CAPSULE BY MOUTH EVERY DAY     ondansetron 4 MG disintegrating tablet  Commonly known as:  ZOFRAN ODT  Take 1 tablet (4 mg total) by mouth every 8 (eight) hours as needed for nausea or vomiting.     ondansetron 8 MG tablet  Commonly known as:  ZOFRAN  Take 1 tablet (8 mg total) by mouth 2 (two) times daily. Start the day after chemo for 3 days. Then take as needed for nausea or vomiting.     pegfilgrastim 6 MG/0.6ML injection  Commonly known as:  NEULASTA  Inject 6 mg into the skin once.     potassium chloride SA 20 MEQ tablet  Commonly known as:  K-DUR,KLOR-CON  TAKE 2 TABLETS(40 MEQ) BY MOUTH DAILY     PRESCRIPTION MEDICATION  Chemo - [facility]     prochlorperazine 10 MG tablet  Commonly known as:  COMPAZINE  Take 10 mg by mouth every 6 (six) hours as needed for nausea or vomiting.     propranolol ER 60 MG 24 hr capsule  Commonly known as:  INDERAL LA  TAKE 1 CAPSULE BY MOUTH AT BEDTIME     pyridOXINE 100 MG tablet  Commonly known as:  VITAMIN B-6   Take 2 pills at one time once a day.     SUPER B COMPLEX PO  Take 1 tablet by mouth daily.     traZODone 50 MG tablet  Commonly known as:  DESYREL  Take 1 tablet (50 mg total) by mouth at bedtime.     venlafaxine 37.5 MG tablet  Commonly known as:  EFFEXOR     venlafaxine 37.5 MG tablet  Commonly known as:  EFFEXOR  TAKE 1 TABLET BY MOUTH TWICE DAILY WITH A MEAL     Vitamin D 2000 units tablet  Take 2,000 Units by mouth daily.        Allergies:  Allergies  Allergen Reactions  . Penicillins Other (See Comments)    Hallucinations, from childhood    Past Medical History, Surgical history, Social history, and Family History were reviewed and updated.  Review of Systems: All other 10 point review of systems is negative.   Physical Exam:  height is 6' 6"  (1.981 m) and weight is 173 lb (78.472 kg). His oral temperature is 97.6 F (36.4 C). His blood pressure is 113/54 and his pulse is 67. His respiration is 16.   Wt Readings from Last 3 Encounters:  02/11/16 173 lb (78.472 kg)  01/28/16 178 lb (80.74 kg)  12/30/15 181 lb (82.101 kg)    Thin but well-nourished white male in no obvious distress. Head and neck exam shows no ocular or oral lesions. He has no mucositis. There is no scleral icterus. He has no adenopathy in the neck. Lungs are clear. Cardiac exam regular rate and rhythm with no murmurs, rubs or bruits. Abdomen is soft. He has good bowel sounds. There is no fluid wave. There is no palpable liver or spleen tip. Back exam shows some tenderness over the spine, ribs or hips. Extremities shows no clubbing, cyanosis or edema. He has decent range of motion of his joints. Skin exam shows no rashes, ecchymoses or petechia. Neurological exam shows the moderate dementia. However, there are no focal findings.   Lab Results  Component Value Date   WBC 13.2* 02/11/2016   HGB 12.1* 02/11/2016   HCT 37.2* 02/11/2016   MCV 91 02/11/2016   PLT 239 02/11/2016   Lab Results  Component Value Date   FERRITIN 35 10/03/2014   IRON 40* 10/03/2014   TIBC 279 10/03/2014   UIBC 239 10/03/2014   IRONPCTSAT 14* 10/03/2014   Lab Results  Component Value Date   RBC 4.08* 02/11/2016   No results found for: KPAFRELGTCHN, LAMBDASER, KAPLAMBRATIO No results found for: Kandis Cocking, IGMSERUM No results found for: Odetta Pink, SPEI   Chemistry      Component Value Date/Time   NA 138 01/26/2016 1320   NA 133* 08/18/2015 1550   K 3.6 01/26/2016 1320   K 4.6 08/18/2015 1550   CL 100 01/26/2016 1320   CL 101 08/18/2015 1550   CO2 27 01/26/2016 1320   CO2 24 08/18/2015 1550   BUN 12 01/26/2016 1320   BUN 14 08/18/2015 1550   CREATININE 0.8 01/26/2016 1320   CREATININE 1.32* 08/18/2015 1550      Component Value Date/Time   CALCIUM 9.1 01/26/2016 1320   CALCIUM 8.3* 08/18/2015 1550   ALKPHOS 120* 01/26/2016 1320   ALKPHOS 41 08/18/2015 1550   AST 17 01/26/2016 1320   AST 22 08/18/2015 1550   ALT 13 01/26/2016 1320   ALT 14* 08/18/2015 1550   BILITOT 0.60 01/26/2016 1320   BILITOT 0.5 08/18/2015 1550     Impression and Plan: Mr. Koudelka is 65 yo white male with metastatic adenocarcinoma of the distal esophagus.  We probably will repeat a PET scan at the end of February.  We will go ahead and give him treatment today. His blood counts were okay.  I still want to make sure we focus on his quality of life. Part of this is allow him to go down to [place]. They will be leaving on Friday and going out for a little over a week.  I will plan for the PET scan after he comes back. We will give him an extra week off.  I spent about 25 minutes with him.    [name], MD 2/15/20179:05 AM

## 2016-02-11 NOTE — Patient Instructions (Signed)
[facility] Discharge Instructions for Patients Receiving Chemotherapy  Today you received the following chemotherapy agents Camptosar, Leuvovorin and 5FU.  To help prevent nausea and vomiting after your treatment, we encourage you to take your nausea medication.   If you develop nausea and vomiting that is not controlled by your nausea medication, call the clinic.   BELOW ARE SYMPTOMS THAT SHOULD BE REPORTED IMMEDIATELY:  *FEVER GREATER THAN 100.5 F  *CHILLS WITH OR WITHOUT FEVER  NAUSEA AND VOMITING THAT IS NOT CONTROLLED WITH YOUR NAUSEA MEDICATION  *UNUSUAL SHORTNESS OF BREATH  *UNUSUAL BRUISING OR BLEEDING  TENDERNESS IN MOUTH AND THROAT WITH OR WITHOUT PRESENCE OF ULCERS  *URINARY PROBLEMS  *BOWEL PROBLEMS  UNUSUAL RASH Items with * indicate a potential emergency and should be followed up as soon as possible.  Feel free to call the clinic you have any questions or concerns. The clinic phone number is [phone].  Please show the [facility] at check-in to the Emergency Department and triage nurse.

## 2016-02-12 LAB — PREALBUMIN: PREALBUMIN: 22 mg/dL (ref 10–36)

## 2016-02-13 ENCOUNTER — Ambulatory Visit (HOSPITAL_BASED_OUTPATIENT_CLINIC_OR_DEPARTMENT_OTHER): Payer: BLUE CROSS/BLUE SHIELD

## 2016-02-13 ENCOUNTER — Ambulatory Visit: Payer: BLUE CROSS/BLUE SHIELD

## 2016-02-13 VITALS — BP 124/64 | HR 66 | Temp 97.6°F | Resp 18

## 2016-02-13 DIAGNOSIS — Z5189 Encounter for other specified aftercare: Secondary | ICD-10-CM | POA: Diagnosis not present

## 2016-02-13 DIAGNOSIS — C155 Malignant neoplasm of lower third of esophagus: Secondary | ICD-10-CM

## 2016-02-13 MED ORDER — HEPARIN SOD (PORK) LOCK FLUSH 100 UNIT/ML IV SOLN
500.0000 [IU] | Freq: Once | INTRAVENOUS | Status: AC | PRN
  Administered 2016-02-13: 500 [IU]
  Filled 2016-02-13: qty 5

## 2016-02-13 MED ORDER — PEGFILGRASTIM INJECTION 6 MG/0.6ML ~~LOC~~
6.0000 mg | PREFILLED_SYRINGE | Freq: Once | SUBCUTANEOUS | Status: AC
  Administered 2016-02-13: 6 mg via SUBCUTANEOUS

## 2016-02-13 MED ORDER — SODIUM CHLORIDE 0.9 % IJ SOLN
10.0000 mL | INTRAMUSCULAR | Status: DC | PRN
  Administered 2016-02-13: 10 mL
  Filled 2016-02-13: qty 10

## 2016-02-13 NOTE — Patient Instructions (Signed)

## 2016-02-16 ENCOUNTER — Ambulatory Visit (HOSPITAL_BASED_OUTPATIENT_CLINIC_OR_DEPARTMENT_OTHER): Payer: BLUE CROSS/BLUE SHIELD

## 2016-02-16 VITALS — BP 112/93 | HR 96 | Temp 98.2°F | Resp 18

## 2016-02-16 DIAGNOSIS — C155 Malignant neoplasm of lower third of esophagus: Secondary | ICD-10-CM | POA: Diagnosis not present

## 2016-02-16 DIAGNOSIS — E860 Dehydration: Secondary | ICD-10-CM

## 2016-02-16 LAB — CBC WITH DIFFERENTIAL (CANCER CENTER ONLY)
BASO#: 0 10*3/uL (ref 0.0–0.2)
BASO%: 0.1 % (ref 0.0–2.0)
EOS%: 0.5 % (ref 0.0–7.0)
Eosinophils Absolute: 0.1 10*3/uL (ref 0.0–0.5)
HEMATOCRIT: 37.1 % — AB (ref 38.7–49.9)
HEMOGLOBIN: 12.3 g/dL — AB (ref 13.0–17.1)
LYMPH#: 2.2 10*3/uL (ref 0.9–3.3)
LYMPH%: 8.9 % — ABNORMAL LOW (ref 14.0–48.0)
MCH: 30.1 pg (ref 28.0–33.4)
MCHC: 33.2 g/dL (ref 32.0–35.9)
MCV: 91 fL (ref 82–98)
MONO#: 0.2 10*3/uL (ref 0.1–0.9)
MONO%: 0.8 % (ref 0.0–13.0)
NEUT%: 89.7 % — AB (ref 40.0–80.0)
NEUTROS ABS: 21.8 10*3/uL — AB (ref 1.5–6.5)
Platelets: 140 10*3/uL — ABNORMAL LOW (ref 145–400)
RBC: 4.09 10*6/uL — AB (ref 4.20–5.70)
RDW: 18.6 % — AB (ref 11.1–15.7)
WBC: 24.2 10*3/uL — AB (ref 4.0–10.0)

## 2016-02-16 LAB — CMP (CANCER CENTER ONLY)
ALK PHOS: 161 U/L — AB (ref 26–84)
ALT: 13 U/L (ref 10–47)
AST: 19 U/L (ref 11–38)
Albumin: 3.7 g/dL (ref 3.3–5.5)
BILIRUBIN TOTAL: 1.3 mg/dL (ref 0.20–1.60)
BUN, Bld: 17 mg/dL (ref 7–22)
CALCIUM: 9.1 mg/dL (ref 8.0–10.3)
CO2: 27 mEq/L (ref 18–33)
Chloride: 98 mEq/L (ref 98–108)
Creat: 1.1 mg/dl (ref 0.6–1.2)
GLUCOSE: 108 mg/dL (ref 73–118)
Potassium: 3.4 mEq/L (ref 3.3–4.7)
Sodium: 135 mEq/L (ref 128–145)
Total Protein: 6.2 g/dL — ABNORMAL LOW (ref 6.4–8.1)

## 2016-02-16 MED ORDER — SODIUM CHLORIDE 0.9 % IV SOLN
Freq: Once | INTRAVENOUS | Status: AC
  Administered 2016-02-16: 14:00:00 via INTRAVENOUS

## 2016-02-16 NOTE — Patient Instructions (Signed)

## 2016-02-18 ENCOUNTER — Other Ambulatory Visit: Payer: Self-pay | Admitting: Hematology & Oncology

## 2016-02-18 ENCOUNTER — Other Ambulatory Visit: Payer: Self-pay | Admitting: *Deleted

## 2016-02-18 ENCOUNTER — Ambulatory Visit (HOSPITAL_BASED_OUTPATIENT_CLINIC_OR_DEPARTMENT_OTHER): Payer: BLUE CROSS/BLUE SHIELD

## 2016-02-18 VITALS — BP 116/76 | HR 108 | Temp 97.7°F | Resp 20

## 2016-02-18 DIAGNOSIS — C155 Malignant neoplasm of lower third of esophagus: Secondary | ICD-10-CM

## 2016-02-18 DIAGNOSIS — E860 Dehydration: Secondary | ICD-10-CM | POA: Diagnosis not present

## 2016-02-18 MED ORDER — HEPARIN SOD (PORK) LOCK FLUSH 100 UNIT/ML IV SOLN
500.0000 [IU] | Freq: Once | INTRAVENOUS | Status: AC
  Administered 2016-02-18: 500 [IU] via INTRAVENOUS
  Filled 2016-02-18: qty 5

## 2016-02-18 MED ORDER — SODIUM CHLORIDE 0.9% FLUSH
10.0000 mL | INTRAVENOUS | Status: DC | PRN
  Administered 2016-02-18: 10 mL via INTRAVENOUS
  Filled 2016-02-18: qty 10

## 2016-02-18 MED ORDER — SODIUM CHLORIDE 0.9 % IV SOLN
Freq: Once | INTRAVENOUS | Status: AC
  Administered 2016-02-18: 14:00:00 via INTRAVENOUS

## 2016-02-18 MED ORDER — PANCRELIPASE (LIP-PROT-AMYL) 20000-68000 UNITS PO CPEP: 2.0000 | ORAL_CAPSULE | Freq: Three times a day (TID) | ORAL | Status: AC

## 2016-02-18 NOTE — Patient Instructions (Signed)

## 2016-02-23 ENCOUNTER — Telehealth: Payer: Self-pay | Admitting: *Deleted

## 2016-02-23 DIAGNOSIS — R197 Diarrhea, unspecified: Secondary | ICD-10-CM

## 2016-02-23 MED ORDER — DIPHENOXYLATE-ATROPINE 2.5-0.025 MG PO TABS: 2.0000 | ORAL_TABLET | Freq: Four times a day (QID) | ORAL | Status: AC

## 2016-02-23 NOTE — Telephone Encounter (Signed)
Patient is on vacation in [place] and experiencing severe diarrhea. Wife has tried all available meds including imodium, but has not tried lomotil. Spoke to Dr Marin Olp and prescription will be phoned in for lomotil. Patient's wife aware.

## 2016-02-25 ENCOUNTER — Ambulatory Visit (HOSPITAL_COMMUNITY): Payer: BLUE CROSS/BLUE SHIELD

## 2016-03-01 ENCOUNTER — Ambulatory Visit (HOSPITAL_COMMUNITY)
Admission: RE | Admit: 2016-03-01 | Discharge: 2016-03-01 | Disposition: A | Discharge status: Home / Self Care | Payer: BLUE CROSS/BLUE SHIELD | Source: Ambulatory Visit | Attending: Hematology & Oncology | Admitting: Hematology & Oncology

## 2016-03-01 DIAGNOSIS — R918 Other nonspecific abnormal finding of lung field: Secondary | ICD-10-CM | POA: Diagnosis not present

## 2016-03-01 DIAGNOSIS — C787 Secondary malignant neoplasm of liver and intrahepatic bile duct: Secondary | ICD-10-CM | POA: Insufficient documentation

## 2016-03-01 DIAGNOSIS — X58XXXA Exposure to other specified factors, initial encounter: Secondary | ICD-10-CM | POA: Insufficient documentation

## 2016-03-01 DIAGNOSIS — S2231XA Fracture of one rib, right side, initial encounter for closed fracture: Secondary | ICD-10-CM | POA: Insufficient documentation

## 2016-03-01 DIAGNOSIS — C155 Malignant neoplasm of lower third of esophagus: Secondary | ICD-10-CM | POA: Insufficient documentation

## 2016-03-01 LAB — GLUCOSE, CAPILLARY: Glucose-Capillary: 91 mg/dL (ref 65–99)

## 2016-03-01 MED ORDER — FLUDEOXYGLUCOSE F - 18 (FDG) INJECTION
9.0000 | Freq: Once | INTRAVENOUS | Status: AC | PRN
  Administered 2016-03-01: 9 via INTRAVENOUS

## 2016-03-09 ENCOUNTER — Other Ambulatory Visit: Payer: Self-pay | Admitting: Hematology & Oncology

## 2016-03-10 ENCOUNTER — Other Ambulatory Visit (HOSPITAL_BASED_OUTPATIENT_CLINIC_OR_DEPARTMENT_OTHER): Payer: BLUE CROSS/BLUE SHIELD

## 2016-03-10 ENCOUNTER — Ambulatory Visit: Payer: BLUE CROSS/BLUE SHIELD

## 2016-03-10 ENCOUNTER — Ambulatory Visit (HOSPITAL_BASED_OUTPATIENT_CLINIC_OR_DEPARTMENT_OTHER): Payer: BLUE CROSS/BLUE SHIELD | Admitting: Hematology & Oncology

## 2016-03-10 VITALS — BP 120/68 | HR 70 | Temp 97.7°F | Resp 16 | Ht 78.0 in | Wt 166.0 lb

## 2016-03-10 DIAGNOSIS — F0390 Unspecified dementia without behavioral disturbance: Secondary | ICD-10-CM | POA: Diagnosis not present

## 2016-03-10 DIAGNOSIS — C787 Secondary malignant neoplasm of liver and intrahepatic bile duct: Secondary | ICD-10-CM

## 2016-03-10 DIAGNOSIS — C155 Malignant neoplasm of lower third of esophagus: Secondary | ICD-10-CM

## 2016-03-10 LAB — CBC WITH DIFFERENTIAL (CANCER CENTER ONLY)
BASO#: 0.1 10*3/uL (ref 0.0–0.2)
BASO%: 0.8 % (ref 0.0–2.0)
EOS%: 1.5 % (ref 0.0–7.0)
Eosinophils Absolute: 0.1 10*3/uL (ref 0.0–0.5)
HEMATOCRIT: 38.8 % (ref 38.7–49.9)
HGB: 12.6 g/dL — ABNORMAL LOW (ref 13.0–17.1)
LYMPH#: 2.2 10*3/uL (ref 0.9–3.3)
LYMPH%: 23.6 % (ref 14.0–48.0)
MCH: 30.2 pg (ref 28.0–33.4)
MCHC: 32.5 g/dL (ref 32.0–35.9)
MCV: 93 fL (ref 82–98)
MONO#: 1 10*3/uL — ABNORMAL HIGH (ref 0.1–0.9)
MONO%: 10.9 % (ref 0.0–13.0)
NEUT#: 6 10*3/uL (ref 1.5–6.5)
NEUT%: 63.2 % (ref 40.0–80.0)
PLATELETS: 296 10*3/uL (ref 145–400)
RBC: 4.17 10*6/uL — ABNORMAL LOW (ref 4.20–5.70)
RDW: 19.9 % — AB (ref 11.1–15.7)
WBC: 9.5 10*3/uL (ref 4.0–10.0)

## 2016-03-10 LAB — CMP (CANCER CENTER ONLY)
ALK PHOS: 93 U/L — AB (ref 26–84)
ALT: 21 U/L (ref 10–47)
AST: 34 U/L (ref 11–38)
Albumin: 3.2 g/dL — ABNORMAL LOW (ref 3.3–5.5)
BILIRUBIN TOTAL: 0.9 mg/dL (ref 0.20–1.60)
BUN: 18 mg/dL (ref 7–22)
CALCIUM: 9.7 mg/dL (ref 8.0–10.3)
CO2: 26 meq/L (ref 18–33)
Chloride: 95 mEq/L — ABNORMAL LOW (ref 98–108)
Creat: 1 mg/dl (ref 0.6–1.2)
GLUCOSE: 133 mg/dL — AB (ref 73–118)
Potassium: 3.9 mEq/L (ref 3.3–4.7)
SODIUM: 140 meq/L (ref 128–145)
Total Protein: 6.5 g/dL (ref 6.4–8.1)

## 2016-03-10 LAB — LACTATE DEHYDROGENASE: LDH: 225 U/L (ref 125–245)

## 2016-03-10 MED ORDER — SODIUM CHLORIDE 0.9% FLUSH
10.0000 mL | INTRAVENOUS | Status: DC | PRN
Start: 2016-03-10 — End: 2016-03-10
  Administered 2016-03-10: 10 mL via INTRAVENOUS
  Filled 2016-03-10: qty 10

## 2016-03-10 MED ORDER — HEPARIN SOD (PORK) LOCK FLUSH 100 UNIT/ML IV SOLN
500.0000 [IU] | Freq: Once | INTRAVENOUS | Status: AC
Start: 2016-03-10 — End: 2016-03-10
  Administered 2016-03-10: 500 [IU] via INTRAVENOUS
  Filled 2016-03-10: qty 5

## 2016-03-10 NOTE — Patient Instructions (Signed)

## 2016-03-10 NOTE — Progress Notes (Signed)
Hematology and Oncology Follow Up Visit  PRINTICE HELLMER [id number] Dec 18, 1951 65 y.o. 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 Coaxum is here today with his wife for follow-up. The had a very good time down in [place]. He was sick a little bit. He has some diarrhea. However, he did manage to enjoy himself.  We did go ahead and repeat a PET scan on him. This, unfortunately showed that he had progressive disease. He had new liver metastasis.  He is not having diarrhea right now. His appetite is coming back. He still has the dementia.  He's had no pain. He's had no cough. He's had no bleeding. He's had no leg swelling. He's had no rashes.  He actually walked in today which is a blessing.  Overall, his performance status is ECOG 1-2.   Medications:    Medication List       This list is accurate as of: 03/10/16  9:15 AM.  Always use your most recent med list.               ALIGN 4 MG Caps  Take 1 capsule by mouth daily with breakfast.     aspirin 81 MG chewable tablet  Chew 1 tablet (81 mg total) by mouth daily.     benzonatate 100 MG capsule  Commonly known as:  TESSALON PERLES  Take 1 capsule (100 mg total) by mouth 3 (three) times daily as needed for cough.     bismuth subsalicylate 509 TO/67TI suspension  Commonly known as:  PEPTO BISMOL  Take 30 mLs by mouth every 6 (six) hours as needed for diarrhea or loose stools.     cetirizine 10 MG tablet  Commonly known as:  ZYRTEC  Take 10 mg by mouth daily as needed for allergies.     diphenhydrAMINE 25 MG tablet  Commonly known as:  BENADRYL  Take 25 mg by mouth every 6 (six) hours as needed for allergies.     diphenoxylate-atropine 2.5-0.025 MG tablet  Commonly known as:  LOMOTIL  Take 2 tablets by mouth 4 (four) times daily. Max 8 tabs per day     lidocaine-prilocaine cream    Commonly known as:  EMLA  APPLY TO THE AFFECTED AREA AS NEEDED     MAGNESIUM-OXIDE 400 (241.3 Mg) MG tablet  Generic drug:  magnesium oxide  TK 1 T PO BID     memantine 10 MG tablet  Commonly known as:  NAMENDA  Take 1 tablet (10 mg total) by mouth 2 (two) times daily.     multivitamin with minerals Tabs tablet  Take 1 tablet by mouth daily.     omeprazole 40 MG capsule  Commonly known as:  PRILOSEC  TAKE 1 CAPSULE BY MOUTH EVERY DAY     ondansetron 4 MG disintegrating tablet  Commonly known as:  ZOFRAN ODT  Take 1 tablet (4 mg total) by mouth every 8 (eight) hours as needed for nausea or vomiting.     Pancrelipase (Lip-Prot-Amyl) 20000 units Cpep  Commonly known as:  ZENPEP  Take 2 capsules (40,000 Units total) by mouth 3 (three) times daily before meals.     pegfilgrastim 6 MG/0.6ML injection  Commonly known as:  NEULASTA  Inject 6 mg into the skin once.     potassium chloride SA 20 MEQ tablet  Commonly known as:  K-DUR,KLOR-CON  TAKE 2 TABLETS(40 MEQ) BY MOUTH DAILY     potassium chloride SA 20 MEQ tablet  Commonly known as:  K-DUR,KLOR-CON  TAKE 2 TABLETS(40 MEQ) BY MOUTH DAILY     PRESCRIPTION MEDICATION  Chemo - [facility]     prochlorperazine 10 MG tablet  Commonly known as:  COMPAZINE  Take 10 mg by mouth every 6 (six) hours as needed for nausea or vomiting.     propranolol ER 60 MG 24 hr capsule  Commonly known as:  INDERAL LA  Take 60 mg by mouth daily.     pyridOXINE 100 MG tablet  Commonly known as:  VITAMIN B-6  Take 2 pills at one time once a day.     SUPER B COMPLEX PO  Take 1 tablet by mouth daily.     traZODone 50 MG tablet  Commonly known as:  DESYREL  Take 1 tablet (50 mg total) by mouth at bedtime.     venlafaxine 37.5 MG tablet  Commonly known as:  EFFEXOR     venlafaxine 37.5 MG tablet  Commonly known as:  EFFEXOR  TAKE 1 TABLET BY MOUTH TWICE DAILY WITH A MEAL     Vitamin D 2000 units tablet  Take 2,000 Units by mouth daily.         Allergies:  Allergies  Allergen Reactions  . Penicillins Other (See Comments)    Hallucinations, from childhood    Past Medical History, Surgical history, Social history, and Family History were reviewed and updated.  Review of Systems: All other 10 point review of systems is negative.   Physical Exam:  height is 6' 6"  (1.981 m) and weight is 166 lb (75.297 kg). His oral temperature is 97.7 F (36.5 C). His blood pressure is 120/68 and his pulse is 70. His respiration is 16.   Wt Readings from Last 3 Encounters:  03/10/16 166 lb (75.297 kg)  02/11/16 173 lb (78.472 kg)  01/28/16 178 lb (80.74 kg)    Thin but well-nourished white male in no obvious distress. Head and neck exam shows no ocular or oral lesions. He has no mucositis. There is no scleral icterus. He has no adenopathy in the neck. Lungs are clear. Cardiac exam regular rate and rhythm with no murmurs, rubs or bruits. Abdomen is soft. He has good bowel sounds. There is no fluid wave. There is no palpable liver or spleen tip. Back exam shows some tenderness over the spine, ribs or hips. Extremities shows no clubbing, cyanosis or edema. He has decent range of motion of his joints. Skin exam shows no rashes, ecchymoses or petechia. Neurological exam shows the moderate dementia. However, there are no focal findings.   Lab Results  Component Value Date   WBC 9.5 03/10/2016   HGB 12.6* 03/10/2016   HCT 38.8 03/10/2016   MCV 93 03/10/2016   PLT 296 03/10/2016   Lab Results  Component Value Date   FERRITIN 35 10/03/2014   IRON 40* 10/03/2014   TIBC 279 10/03/2014   UIBC 239 10/03/2014   IRONPCTSAT 14* 10/03/2014   Lab Results  Component Value Date   RBC 4.17* 03/10/2016   No results found for: KPAFRELGTCHN, LAMBDASER, KAPLAMBRATIO No results found for: IGGSERUM, IGA, IGMSERUM No results found for: Ronnald Ramp, A1GS, A2GS, Tillman Sers, SPEI   Chemistry      Component Value Date/Time    NA 140 03/10/2016 0807   NA 133* 08/18/2015 1550   K 3.9 03/10/2016 0807   K  4.6 08/18/2015 1550   CL 95* 03/10/2016 0807   CL 101 08/18/2015 1550   CO2 26 03/10/2016 0807   CO2 24 08/18/2015 1550   BUN 18 03/10/2016 0807   BUN 14 08/18/2015 1550   CREATININE 1.0 03/10/2016 0807   CREATININE 1.32* 08/18/2015 1550      Component Value Date/Time   CALCIUM 9.7 03/10/2016 0807   CALCIUM 8.3* 08/18/2015 1550   ALKPHOS 93* 03/10/2016 0807   ALKPHOS 41 08/18/2015 1550   AST 34 03/10/2016 0807   AST 22 08/18/2015 1550   ALT 21 03/10/2016 0807   ALT 14* 08/18/2015 1550   BILITOT 0.90 03/10/2016 0807   BILITOT 0.5 08/18/2015 1550     Impression and Plan: Mr. Climer is 65 yo white male with metastatic adenocarcinoma of the distal esophagus.  It is clear that his cancer is progressing. He's been through several lines of therapy. I do still think that any additional treatment is going off her much benefit.  I talk to he and his wife at length. I spent about 35 minutes with them. I explained what was going on. He does not want have any further treatment. Even though though he has the dementia, he is, in my opinion, lucid enough to make a decision like that. His wife certainly agrees.  Our goal now is quality of life and comfort. I think this should be very reasonable.  At this point time, we will just him back in one month. His heart is a what type of future we are looking at. I would think that we probably are looking no more than 3-4 months given that these liver metastasis were not there 2 months ago.  I would like to see him back in one month. We will flush his Port-A-Cath at that time.   [name], MD 3/15/20179:15 AM

## 2016-03-18 ENCOUNTER — Telehealth: Payer: Self-pay | Admitting: *Deleted

## 2016-03-18 NOTE — Telephone Encounter (Signed)
Patient is experiencing pain episodes at night. Last pm he took one 4mg  dilaudid pill without relief. Wife would like to know what she can do for tonight.   Spoke to Dr Marin Olp and he wants patient to take two 4mg  dilaudid pills before bed instead of one. Information given to patients wife. She understands dosing change.

## 2016-03-27 ENCOUNTER — Other Ambulatory Visit: Payer: Self-pay | Admitting: Hematology & Oncology

## 2016-03-29 ENCOUNTER — Telehealth: Payer: Self-pay | Admitting: *Deleted

## 2016-03-29 ENCOUNTER — Other Ambulatory Visit: Payer: Self-pay | Admitting: *Deleted

## 2016-03-29 DIAGNOSIS — C155 Malignant neoplasm of lower third of esophagus: Secondary | ICD-10-CM

## 2016-03-29 MED ORDER — FENTANYL 25 MCG/HR TD PT72: 25.0000 ug | MEDICATED_PATCH | TRANSDERMAL | Status: DC

## 2016-03-29 MED ORDER — HYDROMORPHONE HCL 4 MG PO TABS: 8.0000 mg | ORAL_TABLET | Freq: Four times a day (QID) | ORAL | Status: DC | PRN

## 2016-03-29 MED FILL — HYDROmorphone HCL 4 MG TABS: 4 | 15 days supply | Qty: 120 | Fill #0

## 2016-03-29 MED FILL — fentaNYL 25 MCG/HR PT72: 25 | 30 days supply | Qty: 10 | Fill #0

## 2016-03-29 NOTE — Telephone Encounter (Signed)
Patient wife stating that patient is having increased pain. They are out of pain medication and he needs a new prescription.  Spoke to Dr Marin Olp and he will prescribe both short and long acting pain medication. Instructed wife on fentanyl patch use. She is aware to come to the office for prescriptions.

## 2016-04-12 ENCOUNTER — Encounter: Payer: Self-pay | Admitting: Hematology & Oncology

## 2016-04-12 ENCOUNTER — Ambulatory Visit (HOSPITAL_BASED_OUTPATIENT_CLINIC_OR_DEPARTMENT_OTHER): Payer: BLUE CROSS/BLUE SHIELD | Admitting: Hematology & Oncology

## 2016-04-12 ENCOUNTER — Ambulatory Visit: Payer: BLUE CROSS/BLUE SHIELD

## 2016-04-12 ENCOUNTER — Other Ambulatory Visit (HOSPITAL_BASED_OUTPATIENT_CLINIC_OR_DEPARTMENT_OTHER): Payer: BLUE CROSS/BLUE SHIELD

## 2016-04-12 DIAGNOSIS — C155 Malignant neoplasm of lower third of esophagus: Secondary | ICD-10-CM

## 2016-04-12 HISTORY — DX: Hypercalcemia: E83.52

## 2016-04-12 LAB — CMP (CANCER CENTER ONLY)
ALK PHOS: 148 U/L — AB (ref 26–84)
ALT: 30 U/L (ref 10–47)
AST: 78 U/L — ABNORMAL HIGH (ref 11–38)
Albumin: 2.9 g/dL — ABNORMAL LOW (ref 3.3–5.5)
BILIRUBIN TOTAL: 0.9 mg/dL (ref 0.20–1.60)
BUN: 14 mg/dL (ref 7–22)
CO2: 27 mEq/L (ref 18–33)
CREATININE: 0.9 mg/dL (ref 0.6–1.2)
Calcium: 9.5 mg/dL (ref 8.0–10.3)
Chloride: 96 mEq/L — ABNORMAL LOW (ref 98–108)
Glucose, Bld: 100 mg/dL (ref 73–118)
Potassium: 4 mEq/L (ref 3.3–4.7)
SODIUM: 136 meq/L (ref 128–145)
TOTAL PROTEIN: 6.3 g/dL — AB (ref 6.4–8.1)

## 2016-04-12 LAB — CBC WITH DIFFERENTIAL (CANCER CENTER ONLY)
BASO#: 0 10*3/uL (ref 0.0–0.2)
BASO%: 0.4 % (ref 0.0–2.0)
EOS%: 2.2 % (ref 0.0–7.0)
Eosinophils Absolute: 0.2 10*3/uL (ref 0.0–0.5)
HCT: 35.7 % — ABNORMAL LOW (ref 38.7–49.9)
HGB: 11.7 g/dL — ABNORMAL LOW (ref 13.0–17.1)
LYMPH#: 1.4 10*3/uL (ref 0.9–3.3)
LYMPH%: 19.6 % (ref 14.0–48.0)
MCH: 30.4 pg (ref 28.0–33.4)
MCHC: 32.8 g/dL (ref 32.0–35.9)
MCV: 93 fL (ref 82–98)
MONO#: 0.9 10*3/uL (ref 0.1–0.9)
MONO%: 12.4 % (ref 0.0–13.0)
NEUT#: 4.8 10*3/uL (ref 1.5–6.5)
NEUT%: 65.4 % (ref 40.0–80.0)
PLATELETS: 249 10*3/uL (ref 145–400)
RBC: 3.85 10*6/uL — ABNORMAL LOW (ref 4.20–5.70)
RDW: 16.3 % — ABNORMAL HIGH (ref 11.1–15.7)
WBC: 7.3 10*3/uL (ref 4.0–10.0)

## 2016-04-12 MED ORDER — HEPARIN SOD (PORK) LOCK FLUSH 100 UNIT/ML IV SOLN
500.0000 [IU] | Freq: Once | INTRAVENOUS | Status: AC
  Administered 2016-04-12: 500 [IU] via INTRAVENOUS
  Filled 2016-04-12: qty 5

## 2016-04-12 MED ORDER — SODIUM CHLORIDE 0.9 % IV SOLN
INTRAVENOUS | Status: DC
  Administered 2016-04-12: 13:00:00 via INTRAVENOUS

## 2016-04-12 MED ORDER — SODIUM CHLORIDE 0.9% FLUSH
10.0000 mL | INTRAVENOUS | Status: DC | PRN
  Administered 2016-04-12: 10 mL via INTRAVENOUS
  Filled 2016-04-12: qty 10

## 2016-04-12 NOTE — Patient Instructions (Signed)

## 2016-04-12 NOTE — Progress Notes (Signed)
Hematology and Oncology Follow Up Visit  Willie Owens [id number] 08/17/1951 65 y.o. 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 He is holding his own. He still not eating all that much. He is taking some marijuana.  He's had no diarrhea. He is a little constipated.  He's had no cough. He's had no bleeding. He's had no fever.  He's had no leg swelling.  His mental status seems be holding steady. He's had no further episodes of amnesia.   He has wife just moved into a new house. This is been a little bit stressful for them.   He's had no rashes.   I think the main issue is currently his appetite and potential weight loss.   Overall, his performance status is ECOG 2.   Medications:    Medication List       This list is accurate as of: 04/12/16 12:23 PM.  Always use your most recent med list.               ALIGN 4 MG Caps  Take 1 capsule by mouth daily with breakfast.     aspirin 81 MG chewable tablet  Chew 1 tablet (81 mg total) by mouth daily.     benzonatate 100 MG capsule  Commonly known as:  TESSALON PERLES  Take 1 capsule (100 mg total) by mouth 3 (three) times daily as needed for cough.     bismuth subsalicylate 482 LM/78ML suspension  Commonly known as:  PEPTO BISMOL  Take 30 mLs by mouth every 6 (six) hours as needed for diarrhea or loose stools.     cetirizine 10 MG tablet  Commonly known as:  ZYRTEC  Take 10 mg by mouth daily as needed for allergies.     diphenhydrAMINE 25 MG tablet  Commonly known as:  BENADRYL  Take 25 mg by mouth every 6 (six) hours as needed for allergies.     diphenoxylate-atropine 2.5-0.025 MG tablet  Commonly known as:  LOMOTIL  Take 2 tablets by mouth 4 (four) times daily. Max 8 tabs per day     fentaNYL 25 MCG/HR patch  Commonly known  as:  DURAGESIC - dosed mcg/hr  Place 1 patch (25 mcg total) onto the skin every 3 (three) days.     HYDROmorphone 4 MG tablet  Commonly known as:  DILAUDID  Take 2 tablets (8 mg total) by mouth every 6 (six) hours as needed for severe pain.     lidocaine-prilocaine cream  Commonly known as:  EMLA  APPLY TO THE AFFECTED AREA AS NEEDED     MAGNESIUM-OXIDE 400 (241.3 Mg) MG tablet  Generic drug:  magnesium oxide  TK 1 T PO BID     memantine 10 MG tablet  Commonly known as:  NAMENDA  Take 1 tablet (10 mg total) by mouth 2 (two) times daily.     multivitamin with minerals Tabs tablet  Take 1 tablet by mouth daily.     omeprazole 40 MG capsule  Commonly known as:  PRILOSEC  TAKE 1 CAPSULE BY MOUTH EVERY DAY     ondansetron 4 MG disintegrating tablet  Commonly known as:  ZOFRAN ODT  Take 1 tablet (4 mg total) by mouth every 8 (eight) hours as needed for nausea or vomiting.     Pancrelipase (  Lip-Prot-Amyl) 20000 units Cpep  Commonly known as:  ZENPEP  Take 2 capsules (40,000 Units total) by mouth 3 (three) times daily before meals.     pegfilgrastim 6 MG/0.6ML injection  Commonly known as:  NEULASTA  Inject 6 mg into the skin once.     potassium chloride SA 20 MEQ tablet  Commonly known as:  K-DUR,KLOR-CON  TAKE 2 TABLETS(40 MEQ) BY MOUTH DAILY     potassium chloride SA 20 MEQ tablet  Commonly known as:  K-DUR,KLOR-CON  TAKE 2 TABLETS(40 MEQ) BY MOUTH DAILY     PRESCRIPTION MEDICATION  Chemo - [facility]     prochlorperazine 10 MG tablet  Commonly known as:  COMPAZINE  Take 10 mg by mouth every 6 (six) hours as needed for nausea or vomiting.     propranolol ER 60 MG 24 hr capsule  Commonly known as:  INDERAL LA  Take 60 mg by mouth daily.     pyridOXINE 100 MG tablet  Commonly known as:  VITAMIN B-6  Take 2 pills at one time once a day.     SUPER B COMPLEX PO  Take 1 tablet by mouth daily.     traZODone 50 MG tablet  Commonly known as:  DESYREL  Take 1 tablet (50  mg total) by mouth at bedtime.     venlafaxine 37.5 MG tablet  Commonly known as:  EFFEXOR     venlafaxine 37.5 MG tablet  Commonly known as:  EFFEXOR  TAKE 1 TABLET BY MOUTH TWICE DAILY WITH A MEAL     Vitamin D 2000 units tablet  Take 2,000 Units by mouth daily.        Allergies:  Allergies  Allergen Reactions  . Penicillins Other (See Comments)    Hallucinations, from childhood    Past Medical History, Surgical history, Social history, and Family History were reviewed and updated.  Review of Systems: All other 10 point review of systems is negative.   Physical Exam:  vitals were not taken for this visit.  Wt Readings from Last 3 Encounters:  03/10/16 166 lb (75.297 kg)  02/11/16 173 lb (78.472 kg)  01/28/16 178 lb (80.74 kg)    Thin but well-nourished white male in no obvious distress. Head and neck exam shows no ocular or oral lesions. He has no mucositis. There is no scleral icterus. He has no adenopathy in the neck. Lungs are clear. Cardiac exam regular rate and rhythm with no murmurs, rubs or bruits. Abdomen is soft. He has good bowel sounds. There is no fluid wave. There is no palpable liver or spleen tip. Back exam shows some tenderness over the spine, ribs or hips. Extremities shows no clubbing, cyanosis or edema. He has decent range of motion of his joints. Skin exam shows no rashes, ecchymoses or petechia. Neurological exam shows the moderate dementia. However, there are no focal findings.   Lab Results  Component Value Date   WBC 7.3 04/12/2016   HGB 11.7* 04/12/2016   HCT 35.7* 04/12/2016   MCV 93 04/12/2016   PLT 249 04/12/2016   Lab Results  Component Value Date   FERRITIN 35 10/03/2014   IRON 40* 10/03/2014   TIBC 279 10/03/2014   UIBC 239 10/03/2014   IRONPCTSAT 14* 10/03/2014   Lab Results  Component Value Date   RBC 3.85* 04/12/2016   No results found for: KPAFRELGTCHN, LAMBDASER, KAPLAMBRATIO No results found for: IGGSERUM, IGA,  IGMSERUM No results found for: TOTALPROTELP, ALBUMINELP, A1GS, A2GS, BETS, BETA2SER,  Danise Edge, SPEI   Chemistry      Component Value Date/Time   NA 136 04/12/2016 1054   NA 133* 08/18/2015 1550   K 4.0 04/12/2016 1054   K 4.6 08/18/2015 1550   CL 96* 04/12/2016 1054   CL 101 08/18/2015 1550   CO2 27 04/12/2016 1054   CO2 24 08/18/2015 1550   BUN 14 04/12/2016 1054   BUN 14 08/18/2015 1550   CREATININE 0.9 04/12/2016 1054   CREATININE 1.32* 08/18/2015 1550      Component Value Date/Time   CALCIUM 9.5 04/12/2016 1054   CALCIUM 8.3* 08/18/2015 1550   ALKPHOS 148* 04/12/2016 1054   ALKPHOS 41 08/18/2015 1550   AST 78* 04/12/2016 1054   AST 22 08/18/2015 1550   ALT 30 04/12/2016 1054   ALT 14* 08/18/2015 1550   BILITOT 0.90 04/12/2016 1054   BILITOT 0.5 08/18/2015 1550     Impression and Plan: Mr. Mendibles is 65 yo white male with metastatic adenocarcinoma of the distal esophagus.  He is doing pretty well. He is holding steady with his weight. I think his weight will definitely tell us the future for him.  I am worried about the calcium. When corrected for his low albumin, his calcium is on the high side. I think that we have to treat this. We will go ahead and plan for dose of Zometa.  It is no surprise that the calcium is elevated which we often see with metastatic esophageal cancer. I just do not want him to have any complications.  I will plan to see him back in one month. I don't think when he any scans on him.   We will flush his Port-A-Cath at that time.   [name], MD 4/17/201712:23 PM

## 2016-04-13 ENCOUNTER — Other Ambulatory Visit: Payer: Self-pay | Admitting: *Deleted

## 2016-04-13 LAB — PREALBUMIN: PREALBUMIN: 13 mg/dL (ref 10–36)

## 2016-04-13 MED ORDER — PROPRANOLOL HCL 20 MG PO TABS: 20.0000 mg | ORAL_TABLET | Freq: Two times a day (BID) | ORAL | Status: AC

## 2016-04-22 ENCOUNTER — Other Ambulatory Visit: Payer: Self-pay | Admitting: Hematology & Oncology

## 2016-05-05 ENCOUNTER — Other Ambulatory Visit: Payer: Self-pay | Admitting: *Deleted

## 2016-05-05 ENCOUNTER — Encounter: Payer: Self-pay | Admitting: *Deleted

## 2016-05-05 ENCOUNTER — Telehealth: Payer: Self-pay | Admitting: *Deleted

## 2016-05-05 DIAGNOSIS — C155 Malignant neoplasm of lower third of esophagus: Secondary | ICD-10-CM

## 2016-05-05 MED ORDER — PROCHLORPERAZINE MALEATE 10 MG PO TABS: 10.0000 mg | ORAL_TABLET | Freq: Four times a day (QID) | ORAL | Status: AC | PRN

## 2016-05-05 MED ORDER — HYDROMORPHONE HCL 8 MG PO TABS: 8.0000 mg | ORAL_TABLET | ORAL | Status: AC | PRN

## 2016-05-05 NOTE — Telephone Encounter (Signed)
Received phone call from Karolee Stamps , Rodman wife.  Wife very tearful.  States patient is having increased pain , more nausea, not eating very well.  Patient and wife do not want to get hospice involved at this time.  They want to be able to do this on their own at this time.  Patient states patient doesn't want to come in for fear he would be told he is dying.  Dr. Marin Olp increased his Fentanyl patch from 25 mcg to 50 mcg. Told patient wife he could take 8 mg of Dilaudid every 3-4 hours as needed for pain.  Also sent Rx for Compazine for patient for nausea.  PAtient wife understands these instructions and is appreciative of help.

## 2016-05-06 ENCOUNTER — Other Ambulatory Visit: Payer: Self-pay | Admitting: *Deleted

## 2016-05-06 DIAGNOSIS — C155 Malignant neoplasm of lower third of esophagus: Secondary | ICD-10-CM

## 2016-05-06 MED ORDER — FENTANYL 50 MCG/HR TD PT72: 50.0000 ug | MEDICATED_PATCH | TRANSDERMAL | Status: DC

## 2016-05-06 MED FILL — fentaNYL 50 MCG/HR PT72: 50 | 30 days supply | Qty: 10 | Fill #0

## 2016-05-06 MED FILL — HYDROmorphone HCL 8 MG TABS: 8 | 30 days supply | Qty: 240 | Fill #0

## 2016-05-10 ENCOUNTER — Other Ambulatory Visit: Payer: Self-pay | Admitting: Family

## 2016-05-10 ENCOUNTER — Encounter (HOSPITAL_BASED_OUTPATIENT_CLINIC_OR_DEPARTMENT_OTHER): Payer: Self-pay

## 2016-05-10 ENCOUNTER — Other Ambulatory Visit: Payer: Self-pay | Admitting: Hematology & Oncology

## 2016-05-10 ENCOUNTER — Telehealth: Payer: Self-pay | Admitting: Family

## 2016-05-10 ENCOUNTER — Ambulatory Visit (HOSPITAL_BASED_OUTPATIENT_CLINIC_OR_DEPARTMENT_OTHER): Payer: BLUE CROSS/BLUE SHIELD

## 2016-05-10 ENCOUNTER — Ambulatory Visit (HOSPITAL_BASED_OUTPATIENT_CLINIC_OR_DEPARTMENT_OTHER): Payer: BLUE CROSS/BLUE SHIELD | Admitting: Family

## 2016-05-10 ENCOUNTER — Ambulatory Visit (HOSPITAL_BASED_OUTPATIENT_CLINIC_OR_DEPARTMENT_OTHER)
Admission: RE | Admit: 2016-05-10 | Discharge: 2016-05-10 | Disposition: A | Discharge status: Home / Self Care | Payer: BLUE CROSS/BLUE SHIELD | Source: Ambulatory Visit | Attending: Family | Admitting: Family

## 2016-05-10 ENCOUNTER — Encounter: Payer: Self-pay | Admitting: Family

## 2016-05-10 ENCOUNTER — Telehealth: Payer: Self-pay | Admitting: *Deleted

## 2016-05-10 VITALS — BP 127/77 | HR 96 | Temp 97.2°F | Resp 14 | Ht 78.0 in

## 2016-05-10 DIAGNOSIS — C155 Malignant neoplasm of lower third of esophagus: Secondary | ICD-10-CM

## 2016-05-10 DIAGNOSIS — M545 Low back pain: Secondary | ICD-10-CM | POA: Diagnosis not present

## 2016-05-10 DIAGNOSIS — R7989 Other specified abnormal findings of blood chemistry: Secondary | ICD-10-CM

## 2016-05-10 DIAGNOSIS — R109 Unspecified abdominal pain: Secondary | ICD-10-CM

## 2016-05-10 DIAGNOSIS — R110 Nausea: Secondary | ICD-10-CM

## 2016-05-10 DIAGNOSIS — G893 Neoplasm related pain (acute) (chronic): Secondary | ICD-10-CM

## 2016-05-10 DIAGNOSIS — R112 Nausea with vomiting, unspecified: Secondary | ICD-10-CM

## 2016-05-10 LAB — CMP (CANCER CENTER ONLY)
ALBUMIN: 2.9 g/dL — AB (ref 3.3–5.5)
ALT(SGPT): 63 U/L — ABNORMAL HIGH (ref 10–47)
AST: 176 U/L — AB (ref 11–38)
Alkaline Phosphatase: 250 U/L — ABNORMAL HIGH (ref 26–84)
BUN, Bld: 17 mg/dL (ref 7–22)
CHLORIDE: 86 meq/L — AB (ref 98–108)
CO2: 28 meq/L (ref 18–33)
CREATININE: 1 mg/dL (ref 0.6–1.2)
Calcium: 9.9 mg/dL (ref 8.0–10.3)
GLUCOSE: 144 mg/dL — AB (ref 73–118)
Potassium: 3.9 mEq/L (ref 3.3–4.7)
SODIUM: 130 meq/L (ref 128–145)
Total Bilirubin: 1.8 mg/dl — ABNORMAL HIGH (ref 0.20–1.60)
Total Protein: 6.8 g/dL (ref 6.4–8.1)

## 2016-05-10 LAB — CBC WITH DIFFERENTIAL (CANCER CENTER ONLY)
BASO#: 0 10*3/uL (ref 0.0–0.2)
BASO%: 0.2 % (ref 0.0–2.0)
EOS%: 0.1 % (ref 0.0–7.0)
Eosinophils Absolute: 0 10*3/uL (ref 0.0–0.5)
HCT: 38.4 % — ABNORMAL LOW (ref 38.7–49.9)
HEMOGLOBIN: 13 g/dL (ref 13.0–17.1)
LYMPH#: 1 10*3/uL (ref 0.9–3.3)
LYMPH%: 9.9 % — AB (ref 14.0–48.0)
MCH: 29.5 pg (ref 28.0–33.4)
MCHC: 33.9 g/dL (ref 32.0–35.9)
MCV: 87 fL (ref 82–98)
MONO#: 1.2 10*3/uL — ABNORMAL HIGH (ref 0.1–0.9)
MONO%: 11 % (ref 0.0–13.0)
NEUT#: 8.2 10*3/uL — ABNORMAL HIGH (ref 1.5–6.5)
NEUT%: 78.8 % (ref 40.0–80.0)
Platelets: 374 10*3/uL (ref 145–400)
RBC: 4.41 10*6/uL (ref 4.20–5.70)
RDW: 15 % (ref 11.1–15.7)
WBC: 10.4 10*3/uL — ABNORMAL HIGH (ref 4.0–10.0)

## 2016-05-10 MED ORDER — ONDANSETRON 8 MG PO TBDP: 8.0000 mg | ORAL_TABLET | Freq: Three times a day (TID) | ORAL | Status: AC | PRN

## 2016-05-10 MED ORDER — HYDROMORPHONE HCL 1 MG/ML IJ SOLN
INTRAMUSCULAR | Status: AC
  Filled 2016-05-10: qty 2

## 2016-05-10 MED ORDER — FENTANYL 100 MCG/HR TD PT72: 100.0000 ug | MEDICATED_PATCH | TRANSDERMAL | Status: DC

## 2016-05-10 MED ORDER — HEPARIN SOD (PORK) LOCK FLUSH 100 UNIT/ML IV SOLN
500.0000 [IU] | Freq: Once | INTRAVENOUS | Status: AC
  Administered 2016-05-10: 500 [IU] via INTRAVENOUS
  Filled 2016-05-10: qty 5

## 2016-05-10 MED ORDER — SODIUM CHLORIDE 0.9 % IV SOLN
Freq: Once | INTRAVENOUS | Status: AC
  Administered 2016-05-10: 12:00:00 via INTRAVENOUS
  Filled 2016-05-10: qty 8

## 2016-05-10 MED ORDER — SODIUM CHLORIDE 0.9% FLUSH
10.0000 mL | INTRAVENOUS | Status: DC | PRN
Start: 2016-05-10 — End: 2016-05-10
  Administered 2016-05-10: 10 mL via INTRAVENOUS
  Filled 2016-05-10: qty 10

## 2016-05-10 MED ORDER — HYDROMORPHONE HCL 1 MG/ML IJ SOLN
INTRAMUSCULAR | Status: AC
Start: 2016-05-10 — End: 2016-05-10
  Filled 2016-05-10: qty 2

## 2016-05-10 MED ORDER — SODIUM CHLORIDE 0.9 % IV SOLN
1000.0000 mL | INTRAVENOUS | Status: DC
  Administered 2016-05-10: 12:00:00 via INTRAVENOUS

## 2016-05-10 MED ORDER — OXYCODONE HCL 20 MG/ML PO CONC: 20.0000 mg | ORAL | Status: AC | PRN

## 2016-05-10 MED ORDER — HYDROMORPHONE HCL 1 MG/ML IJ SOLN
2.0000 mg | Freq: Once | INTRAMUSCULAR | Status: AC
  Administered 2016-05-10: 2 mg via INTRAVENOUS

## 2016-05-10 MED ORDER — HYDROMORPHONE HCL 1 MG/ML IJ SOLN
2.0000 mg | Freq: Once | INTRAMUSCULAR | Status: AC
Start: 2016-05-10 — End: 2016-05-10
  Administered 2016-05-10: 2 mg via INTRAVENOUS

## 2016-05-10 MED FILL — ONDANSETRON ODT 8 MG TABLET: 8 | 30 days supply | Qty: 90 | Fill #0

## 2016-05-10 MED FILL — fentaNYL 100 MCG/HR PT72: 100 | 30 days supply | Qty: 10 | Fill #0

## 2016-05-10 MED FILL — OXYCODONE HCL 100 MG/5 ML S: 100 | 5 days supply | Qty: 60 | Fill #0

## 2016-05-10 NOTE — Telephone Encounter (Signed)
Erroneous

## 2016-05-10 NOTE — Progress Notes (Signed)
Hematology and Oncology Follow Up Visit  Willie Owens [id number] 07/30/1951 65 y.o. 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 He was not to keep those or his pain medicine down. He has not been able to eat solid foods in days and has been sipping liquids and eating ice. His last BM was several days ago. He does complain of pain 7/10 in his lower back and also the right abdomen. We will get an abdominal xray today once we have his pain and nausea controlled.  His LFTs are quite elevated today.   No fever, chills,  cough, rash, dizziness, headaches, SOB, chest pain, palpitations or changes in bladder habits.  He is weak and was brought in a wheelchair today. No changes in the neuropathy in his feet. No swelling or tenderness in his extremities. He has had no falls.   Medications:    Medication List       This list is accurate as of: 05/10/16 11:39 AM.  Always use your most recent med list.               ALIGN 4 MG Caps  Take 1 capsule by mouth daily with breakfast. Reported on 05/10/2016     aspirin 81 MG chewable tablet  Chew 1 tablet (81 mg total) by mouth daily.     benzonatate 100 MG capsule  Commonly known as:  TESSALON PERLES  Take 1 capsule (100 mg total) by mouth 3 (three) times daily as needed for cough.     bismuth subsalicylate 409 WJ/19JY suspension  Commonly known as:  PEPTO BISMOL  Take 30 mLs by mouth every 6 (six) hours as needed for diarrhea or loose stools. Reported on 05/10/2016     cetirizine 10 MG tablet  Commonly known as:  ZYRTEC  Take 10 mg by mouth daily as needed for allergies. Reported on 05/10/2016     diphenhydrAMINE 25 MG tablet  Commonly known as:  BENADRYL  Take 25 mg by mouth every 6 (six) hours as needed for  allergies. Reported on 05/10/2016     diphenoxylate-atropine 2.5-0.025 MG tablet  Commonly known as:  LOMOTIL  Take 2 tablets by mouth 4 (four) times daily. Max 8 tabs per day     fentaNYL 25 MCG/HR patch  Commonly known as:  DURAGESIC - dosed mcg/hr     HYDROmorphone 8 MG tablet  Commonly known as:  DILAUDID  Take 1 tablet (8 mg total) by mouth every 3 (three) hours as needed for severe pain (Can take every 3-4 hours as needed prn pain).     lidocaine-prilocaine cream  Commonly known as:  EMLA  APPLY TO THE AFFECTED AREA AS NEEDED     MAGNESIUM-OXIDE 400 (241.3 Mg) MG tablet  Generic drug:  magnesium oxide  Reported on 05/10/2016     memantine 10 MG tablet  Commonly known as:  NAMENDA  Take 1 tablet (10 mg total) by mouth 2 (two) times daily.     multivitamin with minerals Tabs tablet  Take 1 tablet by mouth daily. Reported on 05/10/2016     omeprazole 40 MG capsule  Commonly known as:  PRILOSEC  TAKE 1 CAPSULE BY MOUTH EVERY DAY     ondansetron 4 MG disintegrating tablet  Commonly  known as:  ZOFRAN ODT  Take 1 tablet (4 mg total) by mouth every 8 (eight) hours as needed for nausea or vomiting.     Pancrelipase (Lip-Prot-Amyl) 20000 units Cpep  Commonly known as:  ZENPEP  Take 2 capsules (40,000 Units total) by mouth 3 (three) times daily before meals.     pegfilgrastim 6 MG/0.6ML injection  Commonly known as:  NEULASTA  Inject 6 mg into the skin once. Reported on 05/10/2016     potassium chloride SA 20 MEQ tablet  Commonly known as:  K-DUR,KLOR-CON  TAKE 2 TABLETS(40 MEQ) BY MOUTH DAILY     potassium chloride SA 20 MEQ tablet  Commonly known as:  K-DUR,KLOR-CON  TAKE 2 TABLETS(40 MEQ) BY MOUTH DAILY     PRESCRIPTION MEDICATION  Reported on 05/10/2016     prochlorperazine 10 MG tablet  Commonly known as:  COMPAZINE  Take 1 tablet (10 mg total) by mouth every 6 (six) hours as needed for nausea or vomiting.     propranolol 20 MG tablet  Commonly known as:   INDERAL  Take 1 tablet (20 mg total) by mouth 2 (two) times daily. For five days and then stop.     pyridOXINE 100 MG tablet  Commonly known as:  VITAMIN B-6  Take 2 pills at one time once a day.     SUPER B COMPLEX PO  Take 1 tablet by mouth daily. Reported on 05/10/2016     traZODone 50 MG tablet  Commonly known as:  DESYREL  Take 1 tablet (50 mg total) by mouth at bedtime.     venlafaxine 37.5 MG tablet  Commonly known as:  EFFEXOR  Reported on 05/10/2016     venlafaxine 37.5 MG tablet  Commonly known as:  EFFEXOR  TAKE 1 TABLET BY MOUTH TWICE DAILY WITH A MEAL     Vitamin D 2000 units tablet  Take 2,000 Units by mouth daily. Reported on 05/10/2016        Allergies:  Allergies  Allergen Reactions  . Penicillins Other (See Comments)    Hallucinations, from childhood    Past Medical History, Surgical history, Social history, and Family History were reviewed and updated.  Review of Systems: All other 10 point review of systems is negative.   Physical Exam:  height is _0  (1.981 m). His oral temperature is 97.2 F (36.2 C). His blood pressure is 127/77 and his pulse is 96. His respiration is 14.   Wt Readings from Last 3 Encounters:  03/10/16 166 lb (75.297 kg)  02/11/16 173 lb (78.472 kg)  01/28/16 178 lb (80.74 kg)    Ocular: Sclerae unicteric, pupils equal, round and reactive to light Ear-nose-throat: Oropharynx clear, dentition fair Lymphatic: No cervical supraclavicular or axillary adenopathy Lungs no rales or rhonchi, good excursion bilaterally Heart regular rate and rhythm, no murmur appreciated Abd soft, nontender, decreased bowel sounds, no liver or spleen tip palpated on exam, fluid wave MSK no focal spinal tenderness, no joint edema Neuro: non-focal, well-oriented, appropriate affect Breasts: Deferred  Lab Results  Component Value Date   WBC 7.3 04/12/2016   HGB 11.7* 04/12/2016   HCT 35.7* 04/12/2016   MCV 93 04/12/2016   PLT 249 04/12/2016     Lab Results  Component Value Date   FERRITIN 35 10/03/2014   IRON 40* 10/03/2014   TIBC 279 10/03/2014   UIBC 239 10/03/2014   IRONPCTSAT 14* 10/03/2014   Lab Results  Component Value Date   RBC 3.85* 04/12/2016  No results found for: KPAFRELGTCHN, LAMBDASER, KAPLAMBRATIO No results found for: IGGSERUM, IGA, IGMSERUM No results found for: Odetta Pink, SPEI   Chemistry      Component Value Date/Time   NA 136 04/12/2016 1054   NA 133* 08/18/2015 1550   K 4.0 04/12/2016 1054   K 4.6 08/18/2015 1550   CL 96* 04/12/2016 1054   CL 101 08/18/2015 1550   CO2 27 04/12/2016 1054   CO2 24 08/18/2015 1550   BUN 14 04/12/2016 1054   BUN 14 08/18/2015 1550   CREATININE 0.9 04/12/2016 1054   CREATININE 1.32* 08/18/2015 1550      Component Value Date/Time   CALCIUM 9.5 04/12/2016 1054   CALCIUM 8.3* 08/18/2015 1550   ALKPHOS 148* 04/12/2016 1054   ALKPHOS 41 08/18/2015 1550   AST 78* 04/12/2016 1054   AST 22 08/18/2015 1550   ALT 30 04/12/2016 1054   ALT 14* 08/18/2015 1550   BILITOT 0.90 04/12/2016 1054   BILITOT 0.5 08/18/2015 1550     Impression and Plan: Willie Owens is 65 yo white male with metastatic adenocarcinoma of the distal esophagus. He is now comfort care and his status continues to decline. He is having n/v and has been unable to eat or keep his medications down for several days.  He is having lower back and right abdominal pain. His abdominal x-ray today was negative.  His LFT's are significantly increased today.   I did increase his Fentanyl patch to 100 mcg and reordered his Zofran ODT 8 mg q8h PRN. Dr. Marin Olp also started him on oxycodone for breakthrough pain.  We will continue to see him as needed for supportive care.  Both he and his wife know to contact us with any questions or concerns.   Eliezer Bottom, NP 5/15/201711:39 AM

## 2016-05-10 NOTE — Telephone Encounter (Signed)
Patient's wife notifying office that patient has had a difficult weekend with nausea and vomiting. He is not keeping anything down. He has not been able to eat/drink. He is also unable to take his prn pain and nausea meds.   We will bring him in today to receive fluids and possible IV meds. He was scheduled for appointments tomorrow, however we will address all his needs today and cancel those appointments.

## 2016-05-10 NOTE — Patient Instructions (Signed)

## 2016-05-11 ENCOUNTER — Ambulatory Visit: Payer: BLUE CROSS/BLUE SHIELD | Admitting: Hematology & Oncology

## 2016-05-11 ENCOUNTER — Other Ambulatory Visit: Payer: BLUE CROSS/BLUE SHIELD

## 2016-05-11 ENCOUNTER — Ambulatory Visit: Payer: BLUE CROSS/BLUE SHIELD

## 2016-05-12 ENCOUNTER — Other Ambulatory Visit: Payer: Self-pay | Admitting: *Deleted

## 2016-05-12 ENCOUNTER — Telehealth: Payer: Self-pay | Admitting: *Deleted

## 2016-05-12 DIAGNOSIS — C155 Malignant neoplasm of lower third of esophagus: Secondary | ICD-10-CM

## 2016-05-12 DIAGNOSIS — R112 Nausea with vomiting, unspecified: Secondary | ICD-10-CM

## 2016-05-12 DIAGNOSIS — G893 Neoplasm related pain (acute) (chronic): Secondary | ICD-10-CM

## 2016-05-12 MED ORDER — FENTANYL 100 MCG/HR TD PT72: 100.0000 ug | MEDICATED_PATCH | TRANSDERMAL | Status: AC

## 2016-05-12 MED ORDER — PROMETHAZINE HCL 25 MG RE SUPP: 25.0000 mg | Freq: Four times a day (QID) | RECTAL | Status: DC | PRN

## 2016-05-12 MED ORDER — FENTANYL 100 MCG/HR TD PT72: 100.0000 ug | MEDICATED_PATCH | TRANSDERMAL | Status: DC

## 2016-05-12 MED ORDER — PROMETHAZINE HCL 25 MG RE SUPP: 25.0000 mg | Freq: Four times a day (QID) | RECTAL | Status: AC | PRN

## 2016-05-12 NOTE — Telephone Encounter (Signed)
Received call from [place], with [facility]. Patient is still experiencing significant pain and vomiting. Wanting additional medication to treat symptoms.  Spoke with Dr Marin Olp who will increase his fentanyl to 153mcg q48h and prescribe phenergan suppositories.   New medications called to [facility] and will be faxed to pharmacy.

## 2016-05-17 ENCOUNTER — Telehealth: Payer: Self-pay | Admitting: *Deleted

## 2016-05-17 NOTE — Telephone Encounter (Signed)
Patient's wife Almyra Free calling to notify office that patient passed away at home early this morning. Emotional support provided.   Dr Marin Olp notified.

## 2016-05-27 DEATH — deceased

## 2016-08-13 IMAGING — PT NM PET TUM IMG RESTAG (PS) SKULL BASE T - THIGH
1 of 8 series · 1 of 25 positions shown · non-contrast
Comparison: PET-CT 11/07/2014.

CLINICAL DATA: Subsequent treatment strategy for esophageal cancer.

EXAM:
NUCLEAR MEDICINE PET SKULL BASE TO THIGH
TECHNIQUE: Nine point to mCi F-18 FDG was injected intravenously. Full-ring PET
imaging was performed from the skull base to thigh after the
radiotracer. CT data was obtained and used for attenuation
correction and anatomic localization.
FASTING BLOOD GLUCOSE:  Value: 97 mg/dl

[Series 4: ct sk_thigh 5.0 b31f · axial · 5.0mm · 0.98mm/px · 1 of 240 slices shown]
[im 240/240  brain]
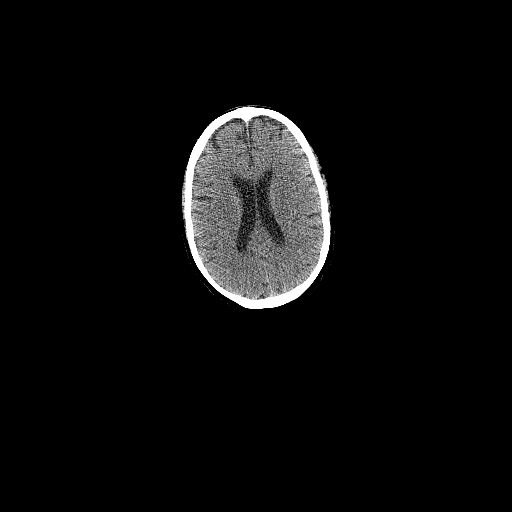

[1 of 25 positions shown; findings below may reference images not displayed]

FINDINGS: NECK

No hypermetabolic lymph nodes in the neck.

CHEST

There is again a long segment of hypermetabolism throughout the mid
to lower esophagus (SUVmax = 5.7), without well-defined mass. This
is very similar to the prior examination (although activity appears
slightly higher). No hypermetabolic mediastinal or hilar nodes. No
suspicious pulmonary nodules on the CT scan. Right internal jugular
single-lumen porta cath with tip terminating in the superior aspect
of the right atrium. There is atherosclerosis of the thoracic aorta,
the great vessels of the mediastinum and the coronary arteries,
including calcified atherosclerotic plaque in the left anterior
descending and right coronary arteries. Small hiatal hernia.

ABDOMEN/PELVIS

No abnormal hypermetabolic activity within the liver, pancreas,
adrenal glands, or spleen. 12 mm short axis hypermetabolic
gastrohepatic ligament lymph node (SUVmax = 2.9)is unchanged. No
other hypermetabolic lymph nodes in the abdomen or pelvis.
Atherosclerosis throughout the abdominal and pelvic vasculature,
without definite aneurysm.

SKELETON

No focal hypermetabolic activity to suggest skeletal metastasis.
IMPRESSION: 1. Today's examination demonstrates stable disease compared to the
prior study, again demonstrating diffuse hypermetabolism throughout
the mid to distal esophagus, without a discrete esophageal mass, and
a mildly enlarged hypermetabolic gastrohepatic ligament lymph node
which is stable in size measuring 12 mm.
2. Additional incidental findings, as above.
